# Patient Record
Sex: Male | Born: 1960
Health system: Southern US, Community
[De-identification: ages and names within clinical notes are randomized; demographics above are authoritative.]

## PROBLEM LIST (undated history)

## (undated) DIAGNOSIS — Z8619 Personal history of other infectious and parasitic diseases: Secondary | ICD-10-CM

## (undated) DIAGNOSIS — K5730 Diverticulosis of large intestine without perforation or abscess without bleeding: Secondary | ICD-10-CM

## (undated) DIAGNOSIS — E079 Disorder of thyroid, unspecified: Secondary | ICD-10-CM

## (undated) DIAGNOSIS — G4730 Sleep apnea, unspecified: Secondary | ICD-10-CM

## (undated) DIAGNOSIS — I509 Heart failure, unspecified: Secondary | ICD-10-CM

## (undated) DIAGNOSIS — M1990 Unspecified osteoarthritis, unspecified site: Secondary | ICD-10-CM

## (undated) DIAGNOSIS — K219 Gastro-esophageal reflux disease without esophagitis: Secondary | ICD-10-CM

## (undated) DIAGNOSIS — E785 Hyperlipidemia, unspecified: Secondary | ICD-10-CM

## (undated) DIAGNOSIS — K635 Polyp of colon: Secondary | ICD-10-CM

## (undated) DIAGNOSIS — I10 Essential (primary) hypertension: Secondary | ICD-10-CM

## (undated) HISTORY — DX: Sleep apnea, unspecified: G47.30

## (undated) HISTORY — DX: Essential (primary) hypertension: I10

## (undated) HISTORY — DX: Gastro-esophageal reflux disease without esophagitis: K21.9

## (undated) HISTORY — DX: Polyp of colon: K63.5

## (undated) HISTORY — DX: Diverticulosis of large intestine without perforation or abscess without bleeding: K57.30

## (undated) HISTORY — PX: TONSILLECTOMY AND ADENOIDECTOMY: SUR1326

## (undated) HISTORY — DX: Personal history of other infectious and parasitic diseases: Z86.19

## (undated) HISTORY — DX: Hyperlipidemia, unspecified: E78.5

---

## 2008-04-07 HISTORY — PX: CARDIAC CATHETERIZATION: SHX172

## 2008-10-19 ENCOUNTER — Ambulatory Visit: Payer: Self-pay | Admitting: Family Medicine

## 2008-10-19 DIAGNOSIS — I10 Essential (primary) hypertension: Secondary | ICD-10-CM

## 2008-10-19 DIAGNOSIS — K219 Gastro-esophageal reflux disease without esophagitis: Secondary | ICD-10-CM | POA: Insufficient documentation

## 2008-10-19 DIAGNOSIS — K5732 Diverticulitis of large intestine without perforation or abscess without bleeding: Secondary | ICD-10-CM | POA: Insufficient documentation

## 2008-10-19 DIAGNOSIS — D126 Benign neoplasm of colon, unspecified: Secondary | ICD-10-CM | POA: Insufficient documentation

## 2008-10-19 DIAGNOSIS — Z9189 Other specified personal risk factors, not elsewhere classified: Secondary | ICD-10-CM | POA: Insufficient documentation

## 2008-10-20 LAB — CONVERTED CEMR LAB
BUN: 13 mg/dL (ref 6–23)
CO2: 29 meq/L (ref 19–32)
Chloride: 103 meq/L (ref 96–112)
Cholesterol: 210 mg/dL — ABNORMAL HIGH (ref 0–200)
Creatinine, Ser: 1.2 mg/dL (ref 0.4–1.5)
Direct LDL: 161.2 mg/dL
Glucose, Bld: 94 mg/dL (ref 70–99)
PSA: 0.42 ng/mL (ref 0.10–4.00)
Potassium: 4.7 meq/L (ref 3.5–5.1)
Total CHOL/HDL Ratio: 7
VLDL: 24 mg/dL (ref 0.0–40.0)

## 2008-11-22 ENCOUNTER — Ambulatory Visit: Payer: Self-pay | Admitting: Family Medicine

## 2008-11-22 DIAGNOSIS — E785 Hyperlipidemia, unspecified: Secondary | ICD-10-CM | POA: Insufficient documentation

## 2008-11-22 LAB — CONVERTED CEMR LAB
Cholesterol, target level: 200 mg/dL
LDL Goal: 130 mg/dL

## 2008-12-20 ENCOUNTER — Encounter: Payer: Self-pay | Admitting: Family Medicine

## 2008-12-25 ENCOUNTER — Ambulatory Visit: Payer: Self-pay | Admitting: Family Medicine

## 2008-12-25 DIAGNOSIS — I447 Left bundle-branch block, unspecified: Secondary | ICD-10-CM | POA: Insufficient documentation

## 2008-12-25 DIAGNOSIS — L02619 Cutaneous abscess of unspecified foot: Secondary | ICD-10-CM

## 2008-12-25 DIAGNOSIS — L03119 Cellulitis of unspecified part of limb: Secondary | ICD-10-CM

## 2008-12-25 DIAGNOSIS — R079 Chest pain, unspecified: Secondary | ICD-10-CM

## 2008-12-25 DIAGNOSIS — T25229A Burn of second degree of unspecified foot, initial encounter: Secondary | ICD-10-CM

## 2009-01-12 ENCOUNTER — Encounter: Payer: Self-pay | Admitting: Family Medicine

## 2009-01-12 ENCOUNTER — Ambulatory Visit: Payer: Self-pay | Admitting: Unknown Physician Specialty

## 2009-01-31 ENCOUNTER — Ambulatory Visit: Payer: Self-pay | Admitting: Cardiology

## 2009-02-05 ENCOUNTER — Telehealth (INDEPENDENT_AMBULATORY_CARE_PROVIDER_SITE_OTHER): Payer: Self-pay

## 2009-02-06 ENCOUNTER — Ambulatory Visit: Payer: Self-pay

## 2009-02-06 ENCOUNTER — Ambulatory Visit: Payer: Self-pay | Admitting: Cardiology

## 2009-02-06 ENCOUNTER — Encounter (HOSPITAL_COMMUNITY): Admission: RE | Admit: 2009-02-06 | Discharge: 2009-04-05 | Payer: Self-pay | Admitting: Cardiology

## 2009-02-27 ENCOUNTER — Ambulatory Visit: Payer: Self-pay

## 2009-02-27 ENCOUNTER — Ambulatory Visit (HOSPITAL_COMMUNITY): Admission: RE | Admit: 2009-02-27 | Discharge: 2009-02-27 | Payer: Self-pay | Admitting: Cardiology

## 2009-02-27 ENCOUNTER — Encounter: Payer: Self-pay | Admitting: Cardiology

## 2009-02-27 ENCOUNTER — Ambulatory Visit: Payer: Self-pay | Admitting: Cardiology

## 2009-03-05 ENCOUNTER — Ambulatory Visit: Payer: Self-pay | Admitting: Cardiology

## 2009-03-05 DIAGNOSIS — I509 Heart failure, unspecified: Secondary | ICD-10-CM | POA: Insufficient documentation

## 2009-03-05 DIAGNOSIS — R0989 Other specified symptoms and signs involving the circulatory and respiratory systems: Secondary | ICD-10-CM

## 2009-03-05 DIAGNOSIS — R0609 Other forms of dyspnea: Secondary | ICD-10-CM

## 2009-03-07 LAB — CONVERTED CEMR LAB
CO2: 31 meq/L (ref 19–32)
Calcium: 9.2 mg/dL (ref 8.4–10.5)
Chloride: 99 meq/L (ref 96–112)
Creatinine, Ser: 1 mg/dL (ref 0.4–1.5)
Glucose, Bld: 84 mg/dL (ref 70–99)
Pro B Natriuretic peptide (BNP): 14 pg/mL (ref 0.0–100.0)

## 2009-03-12 ENCOUNTER — Telehealth (INDEPENDENT_AMBULATORY_CARE_PROVIDER_SITE_OTHER): Payer: Self-pay | Admitting: *Deleted

## 2009-03-12 ENCOUNTER — Ambulatory Visit: Payer: Self-pay | Admitting: Cardiology

## 2009-03-12 ENCOUNTER — Observation Stay (HOSPITAL_COMMUNITY): Admission: EM | Admit: 2009-03-12 | Discharge: 2009-03-13 | Payer: Self-pay | Admitting: Emergency Medicine

## 2009-03-13 ENCOUNTER — Telehealth: Payer: Self-pay | Admitting: Family Medicine

## 2009-03-14 ENCOUNTER — Telehealth: Payer: Self-pay | Admitting: Family Medicine

## 2009-03-15 ENCOUNTER — Telehealth: Payer: Self-pay | Admitting: Family Medicine

## 2009-03-16 ENCOUNTER — Ambulatory Visit: Admission: RE | Admit: 2009-03-16 | Discharge: 2009-03-16 | Payer: Self-pay | Admitting: Cardiology

## 2009-03-24 ENCOUNTER — Encounter: Payer: Self-pay | Admitting: Pulmonary Disease

## 2009-05-01 ENCOUNTER — Ambulatory Visit: Payer: Self-pay | Admitting: Cardiology

## 2009-05-02 DIAGNOSIS — G4733 Obstructive sleep apnea (adult) (pediatric): Secondary | ICD-10-CM

## 2009-05-23 ENCOUNTER — Ambulatory Visit: Payer: Self-pay | Admitting: Pulmonary Disease

## 2009-05-24 ENCOUNTER — Telehealth: Payer: Self-pay | Admitting: Family Medicine

## 2009-06-06 ENCOUNTER — Encounter: Payer: Self-pay | Admitting: Pulmonary Disease

## 2009-06-12 ENCOUNTER — Ambulatory Visit: Payer: Self-pay | Admitting: Cardiology

## 2009-06-12 DIAGNOSIS — I420 Dilated cardiomyopathy: Secondary | ICD-10-CM

## 2009-06-19 ENCOUNTER — Encounter: Payer: Self-pay | Admitting: Pulmonary Disease

## 2009-06-26 ENCOUNTER — Ambulatory Visit: Payer: Self-pay | Admitting: Pulmonary Disease

## 2009-07-04 ENCOUNTER — Telehealth: Payer: Self-pay | Admitting: Pulmonary Disease

## 2009-07-26 ENCOUNTER — Telehealth (INDEPENDENT_AMBULATORY_CARE_PROVIDER_SITE_OTHER): Payer: Self-pay | Admitting: *Deleted

## 2009-08-02 ENCOUNTER — Encounter: Payer: Self-pay | Admitting: Pulmonary Disease

## 2009-08-03 ENCOUNTER — Ambulatory Visit: Payer: Self-pay | Admitting: Cardiology

## 2009-08-13 ENCOUNTER — Ambulatory Visit: Payer: Self-pay | Admitting: Pulmonary Disease

## 2009-09-19 ENCOUNTER — Ambulatory Visit: Payer: Self-pay | Admitting: Cardiology

## 2009-10-22 ENCOUNTER — Ambulatory Visit: Payer: Self-pay | Admitting: Cardiology

## 2009-10-22 DIAGNOSIS — R002 Palpitations: Secondary | ICD-10-CM

## 2009-10-26 ENCOUNTER — Ambulatory Visit: Payer: Self-pay | Admitting: Cardiology

## 2009-10-26 ENCOUNTER — Telehealth (INDEPENDENT_AMBULATORY_CARE_PROVIDER_SITE_OTHER): Payer: Self-pay | Admitting: *Deleted

## 2009-10-29 ENCOUNTER — Ambulatory Visit: Payer: Self-pay | Admitting: Family Medicine

## 2009-10-29 DIAGNOSIS — R5381 Other malaise: Secondary | ICD-10-CM | POA: Insufficient documentation

## 2009-10-29 DIAGNOSIS — R5383 Other fatigue: Secondary | ICD-10-CM

## 2009-10-29 LAB — CONVERTED CEMR LAB
ALT: 20 units/L (ref 0–53)
AST: 22 units/L (ref 0–37)
Albumin: 4.4 g/dL (ref 3.5–5.2)
BUN: 18 mg/dL (ref 6–23)
Basophils Relative: 0.5 % (ref 0.0–3.0)
Cholesterol: 189 mg/dL (ref 0–200)
Creatinine, Ser: 1.2 mg/dL (ref 0.4–1.5)
Eosinophils Absolute: 0.2 10*3/uL (ref 0.0–0.7)
GFR calc non Af Amer: 65.47 mL/min (ref >60)
Glucose, Bld: 99 mg/dL (ref 70–99)
Hemoglobin: 12.9 g/dL — ABNORMAL LOW (ref 13.0–17.0)
Lymphs Abs: 1.9 10*3/uL (ref 0.7–4.0)
MCHC: 35.1 g/dL (ref 30.0–36.0)
MCV: 92 fL (ref 78.0–100.0)
Monocytes Absolute: 0.7 10*3/uL (ref 0.1–1.0)
Neutro Abs: 3.9 10*3/uL (ref 1.4–7.7)
Potassium: 4.5 meq/L (ref 3.5–5.1)
RBC: 3.99 M/uL — ABNORMAL LOW (ref 4.22–5.81)
RDW: 12.7 % (ref 11.5–14.6)
Triglycerides: 172 mg/dL — ABNORMAL HIGH (ref 0.0–149.0)

## 2009-11-01 ENCOUNTER — Ambulatory Visit: Payer: Self-pay | Admitting: Family Medicine

## 2009-11-01 DIAGNOSIS — L570 Actinic keratosis: Secondary | ICD-10-CM

## 2009-11-26 ENCOUNTER — Ambulatory Visit: Payer: Self-pay | Admitting: Cardiology

## 2009-12-24 ENCOUNTER — Ambulatory Visit: Payer: Self-pay | Admitting: Cardiology

## 2010-01-30 ENCOUNTER — Ambulatory Visit: Payer: Self-pay | Admitting: Family Medicine

## 2010-02-04 LAB — CONVERTED CEMR LAB
Albumin: 4.3 g/dL (ref 3.5–5.2)
Alkaline Phosphatase: 42 units/L (ref 39–117)
Cholesterol: 128 mg/dL (ref 0–200)
HDL: 26.9 mg/dL — ABNORMAL LOW (ref >39.00)
LDL Cholesterol: 68 mg/dL (ref 0–99)
Total Protein: 7.3 g/dL (ref 6.0–8.3)
Triglycerides: 167 mg/dL — ABNORMAL HIGH (ref 0.0–149.0)
VLDL: 33.4 mg/dL (ref 0.0–40.0)

## 2010-03-22 ENCOUNTER — Ambulatory Visit: Payer: Self-pay | Admitting: Cardiology

## 2010-04-28 ENCOUNTER — Encounter: Payer: Self-pay | Admitting: Cardiology

## 2010-05-07 NOTE — Assessment & Plan Note (Signed)
Summary: abn sleep study/apc   Visit Type:  Initial Consult Copy to:  Dr. Antoine Poche Primary Provider/Referring Provider:  Hannah Beat MD  CC:  Pt here for sleep consult. Marland Kitchen  History of Present Illness: 49/M , referred for evaluation of obstructive sleep apnea . Presented with recurrent chest pains, cardiac evaluation has revealed non ischemic cardiomyopathy with EF 35%. He is on medical therapy, has succesfully lost 20 lbs fro 250 to 230 in the last 8 months. Epworth Sleepiness Score is 6/24, he reports fatigue on returning from work but denies excessive daytime somnolence. Bedtime around 11 pm, sleep latency about 5 mins, BR x1, no post void latency, sleeps on his side with 1 pillow, wakes up with a dry mouth, no headaches. Snoring is loud, wife sleeps in a separate bedroom because she moves around too much There is no history suggestive of cataplexy, sleep paralysis or parasomnias  He reports irrestible urge to move his legs in the venings but this does not disturn his sleep. Overnight PSG showed nml sleep architecture, AHI 25/h with lowest deaturation to 79%.  Preventive Screening-Counseling & Management  Alcohol-Tobacco     Smoking Status: quit     Packs/Day: 2.0     Year Started: 1978     Year Quit: 1982   History of Present Illness: don't sleep good. Wake up tired  What time do you typically go to bed?(between what hours): 10 & 11pm  How long does it take you to fall asleep?  How many times during the night do you wake up? 10-15  What time do you get out of bed to start your day? 5:30am  Do you drive or operate heavy machinery in your occupation? no  How much has your weight changed (up or down) over the past two years? (in pounds): down 20 pounds  Have you ever had a sleep study before?  If yes,when and where: [facility] 16, 2009[facility]  Do you currently use CPAP ? If so , at what pressure? no  Do you wear oxygen at any time? If yes, how many liters per  minute? no Current Medications (verified): 1)  Lisinopril-Hydrochlorothiazide 10-12.5 Mg Tabs (Lisinopril-Hydrochlorothiazide) .... One Daily 2)  Prilosec Otc 20 Mg Tbec (Omeprazole Magnesium) .... Take One Tab By Mouth Once Daily 3)  Multivitamins  Tabs (Multiple Vitamin) .... Take One Tab By Mouth Once Daily 4)  Carvedilol 3.125 Mg Tabs (Carvedilol) .... One Twice A Day  Allergies (verified): No Known Drug Allergies  Past History:  Past Medical History: Last updated: 05/01/2009 COLONIC POLYPS (ICD-211.3) GERD (ICD-530.81) DIVERTICULITIS, COLON (ICD-562.11) CHICKENPOX, HX OF (ICD-V15.9) Hypertension (recent) Hyperlipidemia (recent) Cardiomyopathy (EF 30 - 40%) Sleep Apnea  Past Surgical History: Last updated: 01/31/2009 Tonsils and adenoids 1960 Colonoscopy, 01/2009, Dr. Mechele Collin, + polyps  Social History: Occupation: Chartered certified accountant Married, lives with wife Sargon Scouten Alcohol use-no Drug use-no Regular exercise-no Tobacco use 1-1/2 packs per day x3 years quitting in 1982 Pt d/c use of Marijuana in 1999Smoking Status:  quit Packs/Day:  2.0  Review of Systems       The patient complains of shortness of breath with activity, non-productive cough, irregular heartbeats, acid heartburn, indigestion, and weight change.  The patient denies shortness of breath at rest, productive cough, coughing up blood, chest pain, loss of appetite, abdominal pain, difficulty swallowing, sore throat, tooth/dental problems, headaches, nasal congestion/difficulty breathing through nose, sneezing, itching, ear ache, anxiety, depression, hand/feet swelling, joint stiffness or pain, rash, change in color of mucus,  and fever.    Vital Signs:  Patient profile:   50 year old male Height:      70 inches Weight:      240.13 pounds O2 Sat:      97 % on Room air Temp:     97.2 degrees F oral Pulse rate:   88 / minute BP sitting:   110 / 80  (left arm) Cuff size:   large  Vitals Entered By: Zackery Barefoot CMA (May 23, 2009 4:09 PM)  O2 Flow:  Room air CC: Pt here for sleep consult.  Comments Medications reviewed with patient Verified pt's contact number Zackery Barefoot CMA  May 23, 2009 4:09 PM    Physical Exam  Additional Exam:  Gen. Pleasant, well-nourished, in no distress, normal affect ENT - no lesions, no post nasal drip, class 2 airway Neck: No JVD, no thyromegaly, no carotid bruits Lungs: no use of accessory muscles, no dullness to percussion, clear without rales or rhonchi  Cardiovascular: Rhythm regular, heart sounds  normal, no murmurs or gallops, no peripheral edema Abdomen: soft and non-tender, no hepatosplenomegaly, BS normal. Musculoskeletal: No deformities, no cyanosis or clubbing Neuro:  alert, non focal     Impression & Recommendations:  Problem # 1:  SLEEP APNEA (ICD-780.57) The pathophysiology of obstructive sleep apnea, it's cardiovascular consequences and modes of treatment including CPAP were discussed with the patient in great detail.  Moderate based on AHi criteria. Ideally should have in lab titration but in absence of significant disease , can try autoCPAP , likely full face mask - will send over  to sleep lab for mask fit & desensitization. Check ownload in 2-4 wks & FU Orders: Consultation Level IV (16109) DME Referral (DME)  Problem # 2:  HYPERTENSION (ICD-401.9) expect better control His updated medication list for this problem includes:    Lisinopril-hydrochlorothiazide 10-12.5 Mg Tabs (Lisinopril-hydrochlorothiazide) ..... One daily    Carvedilol 3.125 Mg Tabs (Carvedilol) ..... One twice a day  Problem # 3:  Restless legs No significant PLMs during sleep but will readdress  if persistent after CPAP use  Patient Instructions: 1)  Copy sent to: Dr Antoine Poche 2)  Please schedule a follow-up appointment in 1 month. 3)  Go over to the sleep lab [phone] , 3 rd floor for mask fit     Appended Document: abn sleep  study/apc fitted with Lg  quattro mask

## 2010-05-07 NOTE — Assessment & Plan Note (Signed)
Summary: cpx/alc   Vital Signs:  Patient profile:   50 year old male Height:      70 inches Weight:      245.2 pounds BMI:     35.31 Temp:     97.8 degrees F oral Pulse rate:   76 / minute Pulse rhythm:   regular BP sitting:   120 / 78  (left arm) Cuff size:   large  Vitals Entered By: Benny Lennert CMA Duncan Dull) (November 01, 2009 2:41 PM)  History of Present Illness: Chief complaint cpx  Cadriomyopathy, tolerating all increases in medications without difficulty.think followed and managed by  cardiology.  They've been increasing his Corag dosing and he is not had problems with this. Also on aspirin therapy and ACE inhibitor.  OSA, propylene now wearing his CPAP.  R facial lesion, 2 small, frozencome to areas of concern. Additionally also has a another area on his back he would like me to look at.   Hyperlipidemia, patient's not at goal given his risk factors  including hypertension and cardiomyopathy, so he would have  an LDL goal of middle ear 100 depending or at least a potential is 70.  He has not been on any medications in the past, and we did go reviewed the positives and negatives as side effects of these.  Lipid Management History:      Positive NCEP/ATP III risk factors include male age 98 years old or older, HDL cholesterol less than 40, and hypertension.  Negative NCEP/ATP III risk factors include non-diabetic, non-tobacco-user status, no ASHD (atherosclerotic heart disease), no prior stroke/TIA, no peripheral vascular disease, and no history of aortic aneurysm.        The patient states that he knows about the "Therapeutic Lifestyle Change" diet.  His compliance with the TLC diet is poor.    Preventive Screening-Counseling & Management  Alcohol-Tobacco     Alcohol drinks/day: 0     Alcohol Counseling: not indicated; patient does not drink     Smoking Status: quit     Packs/Day: 2.0     Year Started: 1978     Year Quit: 1982     Tobacco Counseling: to remain off  tobacco products  Caffeine-Diet-Exercise     Diet Comments: Not goot     Diet Counseling: improving, lost 20 pounds     Does Patient Exercise: no     Exercise Counseling: to improve exercise regimen  Hep-HIV-STD-Contraception     Hepatitis Risk: no risk noted     HIV Risk: no risk noted     STD Risk: no risk noted     Contraception Counseling: not indicated; no questions/concerns expressed     Testicular SE Education/Counseling to perform regular STE     Sun Exposure Counseling: to decrease sun exposure  Safety-Violence-Falls     Seat Belt Use: yes     Violence in the Home: no risk noted     Sexual Abuse: no     Fall Risk Counseling: not indicated; no significant falls noted      Sexual History:  currently monogamous.        Drug Use:  former, intranasal cocaine, marijuana, and no.    Clinical Review Panels:  Prevention   Last PSA:  0.67 (10/29/2009)  Lipid Management   Cholesterol:  189 (10/29/2009)   LDL (bad choesterol):  127 (10/29/2009)   HDL (good cholesterol):  28.10 (10/29/2009)  CBC   WBC:  6.6 (10/29/2009)   RBC:  3.99 (10/29/2009)  Hgb:  12.9 (10/29/2009)   Hct:  36.7 (10/29/2009)   Platelets:  221.0 (10/29/2009)   MCV  92.0 (10/29/2009)   MCHC  35.1 (10/29/2009)   RDW  12.7 (10/29/2009)   PMN:  58.4 (10/29/2009)   Lymphs:  28.8 (10/29/2009)   Monos:  10.0 (10/29/2009)   Eosinophils:  2.3 (10/29/2009)   Basophil:  0.5 (10/29/2009)  Complete Metabolic Panel   Glucose:  99 (10/29/2009)   Sodium:  133 (10/29/2009)   Potassium:  4.5 (10/29/2009)   Chloride:  98 (10/29/2009)   CO2:  28 (10/29/2009)   BUN:  18 (10/29/2009)   Creatinine:  1.2 (10/29/2009)   Albumin:  4.4 (10/29/2009)   Total Protein:  7.2 (10/29/2009)   Calcium:  9.1 (10/29/2009)   Total Bili:  0.8 (10/29/2009)   Alk Phos:  43 (10/29/2009)   SGPT (ALT):  20 (10/29/2009)   SGOT (AST):  22 (10/29/2009)   Allergies (verified): No Known Drug Allergies  Past History:  Past  medical, surgical, family and social histories (including risk factors) reviewed, and no changes noted (except as noted below).  Past Medical History: Reviewed history from 05/01/2009 and no changes required. COLONIC POLYPS (ICD-211.3) GERD (ICD-530.81) DIVERTICULITIS, COLON (ICD-562.11) CHICKENPOX, HX OF (ICD-V15.9) Hypertension (recent) Hyperlipidemia (recent) Cardiomyopathy (EF 30 - 40%) Sleep Apnea  Past Surgical History: Reviewed history from 01/31/2009 and no changes required. Tonsils and adenoids 1960 Colonoscopy, 01/2009, Dr. Mechele Collin, + polyps  Family History: Reviewed history from 01/31/2009 and no changes required. Family History of Arthritis Family History Diabetes 1st degree relative Family History High cholesterol Family History Lung cancer Family History of Stroke F 1st degree relative <60 Family History of Stroke M 1st degree relative <50  Social History: Reviewed history from 05/23/2009 and no changes required. Occupation: Chartered certified accountant Married, lives with wife Creedon Danielski Alcohol use-no Drug use-no Regular exercise-no Tobacco use 1-1/2 packs per day x3 years quitting in 1982 Pt d/c use of Marijuana in 1999  Review of Systems       The patient complains of weight loss, dyspnea on exertion, and suspicious skin lesions.  The patient denies anorexia, fever, weight gain, vision loss, decreased hearing, hoarseness, chest pain, syncope, peripheral edema, headaches, hemoptysis, abdominal pain, melena, hematochezia, severe indigestion/heartburn, hematuria, incontinence, genital sores, muscle weakness, transient blindness, difficulty walking, depression, unusual weight change, abnormal bleeding, enlarged lymph nodes, angioedema, and testicular masses.         Otherwise, the pertinent positives and negatives are listed above and in the HPI, otherwise a full review of systems has been reviewed and is negative unless noted positive.    Impression &  Recommendations:  Problem # 1:  HEALTH MAINTENANCE EXAM (ICD-V70.0) The patient's preventative maintenance and recommended screening tests for an annual wellness exam were reviewed in full today. Brought up to date unless services declined.  Counselled on the importance of diet, exercise, and its role in overall health and mortality. The patient's FH and SH was reviewed, including their home life, tobacco status, and drug and alcohol status.   Problem # 2:  HYPERLIPIDEMIA (ICD-272.4) >10 minutes spent in face to face time with patient, >50% spent in counselling or coordination of care: 10 mins alone talking about statins, their risk and role in coronary disease,  atherosclerosis, peripheral vascular disease, rashes, and  complications.  The patient ultimately elected to begin medical therapy.  His updated medication list for this problem includes:    Simvastatin 40 Mg Tabs (Simvastatin) .Marland Kitchen... 1 by mouth at bedtime  Labs Reviewed: SGOT: 22 (10/29/2009)   SGPT: 20 (10/29/2009)  Lipid Goals: Chol Goal: 200 (11/22/2008)   HDL Goal: 40 (11/22/2008)   LDL Goal: 130 (11/22/2008)   TG Goal: 150 (11/22/2008)  Prior 10 Yr Risk Heart Disease: 18 % (11/22/2008)   HDL:28.10 (10/29/2009), 31.60 (10/19/2008)  LDL:127 (10/29/2009), 160 (10/19/2008)  Chol:189 (10/29/2009), 210 (10/19/2008)  Trig:172.0 (10/29/2009), 120.0 (10/19/2008)  Problem # 3:  ACTINIC KERATOSIS (ICD-702.0) Assessment: New  What appears to be 2 AK's vs. SK' s on the R face were frozen off with cryosurgery  Orders: Cryotherapy/Destruction benign or premalignant lesion (1st lesion)  (17000)  Complete Medication List: 1)  Prilosec Otc 20 Mg Tbec (Omeprazole magnesium) .... Take one tab by mouth once daily 2)  Multivitamins Tabs (Multiple vitamin) .... Take one tab by mouth once daily 3)  Lisinopril 10 Mg Tabs (Lisinopril) .... One daily 4)  Cpap 8 Cm  5)  Carvedilol 12.5 Mg Tabs (Carvedilol) .... One twice a day 6)   Carvedilol 3.125 Mg Tabs (Carvedilol) .... One twice a day 7)  Simvastatin 40 Mg Tabs (Simvastatin) .Marland Kitchen.. 1 by mouth at bedtime  Lipid Assessment/Plan:      Based on NCEP/ATP III, the patient's risk factor category is "2 or more risk factors and a calculated 10 year CAD risk of < 20%".  The patient's lipid goals have been set as follows: Total cholesterol goal is 200; LDL cholesterol goal is 100; HDL cholesterol goal is 40; Triglyceride goal is 150.  His LDL cholesterol goal has not been met.  He has been counseled on adjunctive measures for lowering his cholesterol and has been provided with dietary instructions.    Patient Instructions: 1)  recheck 2)  FLP, HFP: 272.4, 3 months Prescriptions: SIMVASTATIN 40 MG TABS (SIMVASTATIN) 1 by mouth at bedtime  #30 x 11   Entered and Authorized by:   Hannah Beat MD   Signed by:   Hannah Beat MD on 11/01/2009   Method used:   Electronically to        [facility]  [id number] (retail)       [address]       [place], [place]  [address]       Ph: [phone] or [phone]       Fax: [phone]   RxID:   [id number]   Current Allergies (reviewed today): No known allergies      Diagnosis: Actinic Keratoses Destruction: Cryosurgery Body area(s) R facial area, cheek Number of Lesions: 2 Anesthesia: None Disposition: To home    General Medical Physical Exam:  General Appearance:      Well developed, well nourished, in no acute distress.  Head:      Inspection:     normocephalic without obvious abnormalities      Palpation:     no abnormal lesions palpable  Eyes:      External:     conjunctiva and lids normal      Pupils:     equal, round, and reactive to light and accommodation  Ears, Nose, Throat:      External:     normocephalic and atraumatic      Otoscopic:     canals clear; tympanic membranes intact with normal light reflex      Hearing:     grossly intact      Nasal:     no external deformity.         Dental:     good dentition  Pharynx:     tongue normal; posterior pharynx without erythema or exudate  Neck:      Neck:     supple; no masses; trachea midline      Thyroid:     no nodules, masses, tenderness, or enlargement  Respiratory:      Resp. effort:     no intercostal retractions or use of accessory muscles      Percussion:     no dullness      Palpation:     normal fremitus      Auscultation:     no rales, rhonchi, or wheezes  Chest Wall:      Chest wall:     no masses or gynecomastia      Axilla:     no axillary adenopathy  Cardiovascular:      Palpation:     normal PMI with no thrills palpable.        Auscultation:     regular rate and rhythm, S1, S2 without murmurs, rubs, gallops, or clicks.    Gastrointestinal:      Abdomen:     soft and non-tender with normal bowel sounds; no masses      Liver/spleen:     normal to percussion; no enlargement or nodularity      Hernia:     no hernias      Rectal:     no masses or tenderness      Stool:     not done  Genitourinary:      Scrotum:     no lesions, cysts, edema, or rash      Penis:     no lesions or discharge      Prostate:     no enlargement or nodularity  Musculoskeletal:      Gait/station:     normal gait; no ataxia      Digits/nails:     no cyanosis, clubbing, or petechiae      Head/neck:     normal alignment and mobility      Trunk:     normal alignment and mobility; no deformity      RUE:     normal range of motion and strength      RLE:     normal range of motion and strength      LUE:     normal range of motion and strength      LLE:       normal range of motion and strength  Lymphatic:      Neck:     no cervical adenopathy      Axilla:     no axillary adenopathy      Inguinal:     no inguinal adenopathy      Other:     no other adenopathy  Skin:      Inspection:     seborrheic keratosis      Other:     ak as well  Neurological:      Sensory:     intact to touch  Psychiatric:      Judgement:      intact      Orientation:     oriented to time, place, and person      Memory:     intact for recent and remote events      Mood/affect:     no appearance of anxiety, depression, or agitation

## 2010-05-07 NOTE — Assessment & Plan Note (Signed)
Summary: rov 1 month///kp   Visit Type:  Follow-up Copy to:  Dr. Antoine Poche Primary Provider/Referring Provider:  Hannah Beat MD  CC:  Pt here for sleep follow up. Compliance report received.  History of Present Illness: 49/M , for post CPAP FU of obstructive sleep apnea . Presented with recurrent chest pains, cardiac evaluation has revealed non ischemic cardiomyopathy with EF 35%. He is on medical therapy, has succesfully lost 20 lbs fro 250 to 230 in the last 8 months. Epworth Sleepiness Score is 6/24, he reports fatigue on returning from work but denies excessive daytime somnolence. Bedtime around 11 pm, sleep latency about 5 mins, BR x1, no post void latency, sleeps on his side with 1 pillow, wakes up with a dry mouth, no headaches. Snoring is loud, wife sleeps in a separate bedroom because she moves around too much. He reports irrestible urge to move his legs in the venings but this does not disturn his sleep. Overnight PSG showed nml sleep architecture, AHI 25/h with lowest deaturation to 79%. Moderate based on AHi criteria. Ideally should have in lab titration but in absence of significant disease , can try autoCPAP , likely full face mask - will send over  to sleep lab for mask fit & desensitization.  June 26, 2009 4:38 PM  frequent arousals, sleep latency ok, no dryness, mask ok,  download 2/23 - 3/15 /11 >> avg pressure 8 cm, AHI 3.4, leak +  Current Medications (verified): 1)  Lisinopril-Hydrochlorothiazide 10-12.5 Mg Tabs (Lisinopril-Hydrochlorothiazide) .... One Daily 2)  Prilosec Otc 20 Mg Tbec (Omeprazole Magnesium) .... Take One Tab By Mouth Once Daily 3)  Multivitamins  Tabs (Multiple Vitamin) .... Take One Tab By Mouth Once Daily 4)  Carvedilol 6.25 Mg Tabs (Carvedilol) .... One Two Times A Day  Allergies (verified): No Known Drug Allergies  Past History:  Past Medical History: Last updated: 05/01/2009 COLONIC POLYPS (ICD-211.3) GERD  (ICD-530.81) DIVERTICULITIS, COLON (ICD-562.11) CHICKENPOX, HX OF (ICD-V15.9) Hypertension (recent) Hyperlipidemia (recent) Cardiomyopathy (EF 30 - 40%) Sleep Apnea  Social History: Last updated: 05/23/2009 Occupation: Chartered certified accountant Married, lives with wife Obryan Radu Alcohol use-no Drug use-no Regular exercise-no Tobacco use 1-1/2 packs per day x3 years quitting in 1982 Pt d/c use of Marijuana in 1999  Risk Factors: Smoking Status: quit (05/23/2009) Packs/Day: 2.0 (05/23/2009)  Review of Systems  The patient denies anorexia, fever, weight loss, weight gain, vision loss, decreased hearing, hoarseness, chest pain, syncope, dyspnea on exertion, peripheral edema, prolonged cough, headaches, hemoptysis, abdominal pain, melena, hematochezia, severe indigestion/heartburn, hematuria, muscle weakness, suspicious skin lesions, transient blindness, difficulty walking, depression, unusual weight change, abnormal bleeding, breast masses, and testicular masses.    Vital Signs:  Patient profile:   50 year old male Height:      70 inches Weight:      248.25 pounds O2 Sat:      96 % on Room air Temp:     98.0 degrees F oral Pulse rate:   71 / minute BP sitting:   120 / 68  (left arm) Cuff size:   large  Vitals Entered By: Zackery Barefoot CMA (June 26, 2009 4:16 PM)  O2 Flow:  Room air CC: Pt here for sleep follow up. Compliance report received Comments Medications reviewed with patient Verified contact number and pharmacy with patient Zackery Barefoot CMA  June 26, 2009 4:16 PM    Physical Exam  Additional Exam:  Gen. Pleasant, well-nourished, in no distress, normal affect ENT - no lesions, no post nasal drip,  class 2 airway Neck: No JVD, no thyromegaly, no carotid bruits Lungs: no use of accessory muscles, no dullness to percussion, clear without rales or rhonchi  Cardiovascular: Rhythm regular, heart sounds  normal, no murmurs or gallops, no peripheral edema Musculoskeletal:  No deformities, no cyanosis or clubbing      Impression & Recommendations:  Problem # 1:  SLEEP APNEA (ICD-780.57) Change to fixed pressure 8 cm, reviewed download. Discussed adjustments for leak. Compliance encouraged, wt loss emphasized, asked to avoid meds with sedative side effects, cautioned against driving when sleepy.   Orders: Est. Patient Level III (16109) DME Referral (DME)  Problem # 2:  CARDIOMYOPATHY (ICD-425.4) discussed cardiac benefits of CPAP  Patient Instructions: 1)  Please schedule a follow-up appointment in 1 month. 2)  Change to fixed presure of 8 cm 3)  Keep using your machine at least 6h/ night

## 2010-05-07 NOTE — Assessment & Plan Note (Signed)
Summary: 6 week f/u sl   Visit Type:  Follow-up Primary Provider:  Hannah Beat MD  CC:  Cardiomyopathy.  History of Present Illness: The patient presents for followup of his dilated cardiomyopathy. At the last appointment I increased his carvedilol to 9.375 mg b.i.d. He did well with this. He had no lightheadedness, presyncope or syncope. He had no new shortness of breath, PND or orthopnea. She had no chest pressure, neck or arm discomfort. He is not as active as I would hope he would be but is having no difficulties from a cardiovascular standpoint with his activities of daily living.  Current Medications (verified): 1)  Prilosec Otc 20 Mg Tbec (Omeprazole Magnesium) .... Take One Tab By Mouth Once Daily 2)  Multivitamins  Tabs (Multiple Vitamin) .... Take One Tab By Mouth Once Daily 3)  Carvedilol 6.25 Mg Tabs (Carvedilol) .... One Two Times A Day 4)  Carvedilol 3.125 Mg Tabs (Carvedilol) .... One Two Times A Day With 6.25 Two Times A Day For A Total For 9.375 Mg Bid 5)  Lisinopril 10 Mg Tabs (Lisinopril) .... One Daily 6)  Cpap 8 Cm  Allergies (verified): No Known Drug Allergies  Past History:  Past Medical History: Reviewed history from 05/01/2009 and no changes required. COLONIC POLYPS (ICD-211.3) GERD (ICD-530.81) DIVERTICULITIS, COLON (ICD-562.11) CHICKENPOX, HX OF (ICD-V15.9) Hypertension (recent) Hyperlipidemia (recent) Cardiomyopathy (EF 30 - 40%) Sleep Apnea  Past Surgical History: Reviewed history from 01/31/2009 and no changes required. Tonsils and adenoids 1960 Colonoscopy, 01/2009, Dr. Mechele Collin, + polyps  Review of Systems       As stated in the HPI and negative for all other systems.   Vital Signs:  Patient profile:   50 year old male Height:      70 inches Weight:      245 pounds BMI:     35.28 Pulse rate:   62 / minute Resp:     18 per minute BP sitting:   116 / 72  (left arm)  Vitals Entered By: Marrion Coy, CNA (September 19, 2009 4:02  PM)  Physical Exam  General:  Well developed, well nourished, in no acute distress. Neck:  Neck supple, no JVD. No masses, thyromegaly or abnormal cervical nodes. Chest Wall:  no deformities or breast masses noted Lungs:  Clear bilaterally to auscultation and percussion. Abdomen:  Bowel sounds positive; abdomen soft and non-tender without masses, organomegaly, or hernias noted. No hepatosplenomegaly, obese Msk:  Back normal, normal gait. Muscle strength and tone normal. Extremities:  No clubbing or cyanosis, mild bilateral edema Neurologic:  Alert and oriented x 3. Skin:  Intact without lesions or rashes. Psych:  Normal affect.   Detailed Cardiovascular Exam  Neck    Carotids: Carotids full and equal bilaterally without bruits.      Neck Veins: Normal, no JVD.    Heart    Inspection: no deformities or lifts noted.      Palpation: normal PMI with no thrills palpable.      Auscultation: regular rate and rhythm, S1, S2 without murmurs, rubs, gallops, or clicks.    Vascular    Abdominal Aorta: no palpable masses, pulsations, or audible bruits.      Femoral Pulses: normal femoral pulses bilaterally.      Radial Pulses: normal radial pulses bilaterally.      Peripheral Circulation: no clubbing, cyanosis, with normal capillary refill.     Impression & Recommendations:  Problem # 1:  CARDIOMYOPATHY (ICD-425.4) Today I will increase his carvedilol 12  mg b.i.d. Her target ideally would be 50 b.i.d.  Problem # 2:  OBESITY, UNSPECIFIED (ICD-278.00) I discussed with him again today the need to lose weight with diet and exercise.  Problem # 3:  SLEEP APNEA (ICD-780.57) He is having this treated.  Patient Instructions: 1)  Your physician recommends that you schedule a follow-up appointment in: 1 month with Dr Antoine Poche 2)  Your physician has recommended you make the following change in your medication: Increase Carvedilol to 12.5 mg one twice a day Prescriptions: CARVEDILOL 12.5 MG  TABS (CARVEDILOL) one twice a day  #60 x 3   Entered by:   Charolotte Capuchin, RN   Authorized by:   Rollene Rotunda, MD, [facility]   Signed by:   Charolotte Capuchin, RN on 09/19/2009   Method used:   [facility]        [facility]  [address] [facility])       [address]       [place], [place]  [address]       Ph: [phone] or [phone]       Fax: [phone]   RxID:   [id number]

## 2010-05-07 NOTE — Progress Notes (Signed)
----   Converted from flag ---- ---- 10/26/2009 12:21 PM, Hannah Beat MD wrote: Prephysical Labs, several days before, fasting BMP, HFP, FLP, CBC with diff, TSH, PSA: 272.4, v77.1, ,780.79, v76.44   ---- 10/26/2009 9:58 AM, Mills Koller wrote: Patient is scheduled for cpx with you, need orders for labs w/ dx. Thanks, Terri ------------------------------

## 2010-05-07 NOTE — Assessment & Plan Note (Signed)
Summary: 1 month med titration 428.22  pfh,rn   Visit Type:  Follow-up Referring Provider:  Dr. Antoine Poche Primary Provider:  Hannah Beat MD  CC:  cardiomyopathy.  History of Present Illness: The patient presents for followup of his cardiomyopathy.  At the last appointment I increased his Coreg slightly. He had no significant problems with this. He does get occasional orthostatic symptoms. He also describes palpitations. He has mentioned these before but thinks they're occurring with increasing frequency and duration lasting 5 minutes at a time. He cannot bring these on. He does not have presyncope or syncope. He does feel uncomfortable with these. He is not describing chest pressure, neck or arm discomfort. He is not describing shortness of breath, PND or orthopnea.  Current Medications (verified): 1)  Prilosec Otc 20 Mg Tbec (Omeprazole Magnesium) .... Take One Tab By Mouth Once Daily 2)  Multivitamins  Tabs (Multiple Vitamin) .... Take One Tab By Mouth Once Daily 3)  Lisinopril 10 Mg Tabs (Lisinopril) .... One Daily 4)  Cpap 8 Cm 5)  Carvedilol 12.5 Mg Tabs (Carvedilol) .... One Twice A Day  Allergies (verified): No Known Drug Allergies  Past History:  Past Medical History: Reviewed history from 05/01/2009 and no changes required. COLONIC POLYPS (ICD-211.3) GERD (ICD-530.81) DIVERTICULITIS, COLON (ICD-562.11) CHICKENPOX, HX OF (ICD-V15.9) Hypertension (recent) Hyperlipidemia (recent) Cardiomyopathy (EF 30 - 40%) Sleep Apnea  Past Surgical History: Reviewed history from 01/31/2009 and no changes required. Tonsils and adenoids 1960 Colonoscopy, 01/2009, Dr. Mechele Collin, + polyps  Review of Systems       As stated in the HPI and negative for all other systems.   Vital Signs:  Patient profile:   50 year old male Height:      70 inches Weight:      244 pounds BMI:     35.14 Pulse rate:   64 / minute BP sitting:   115 / 71  (left arm) Cuff size:   large  Vitals  Entered By: Hardin Negus, RMA (October 22, 2009 4:28 PM)  Physical Exam  General:  Well developed, well nourished, in no acute distress. Head:  normocephalic and atraumatic Eyes:  PERRLA/EOM intact; conjunctiva and lids normal. Mouth:  Teeth, gums and palate normal. Oral mucosa normal. Neck:  Neck supple, no JVD. No masses, thyromegaly or abnormal cervical nodes. Chest Wall:  no deformities or breast masses noted Lungs:  Clear bilaterally to auscultation and percussion. Abdomen:  Bowel sounds positive; abdomen soft and non-tender without masses, organomegaly, or hernias noted. No hepatosplenomegaly. Msk:  Back normal, normal gait. Muscle strength and tone normal. Extremities:  No clubbing or cyanosis. Neurologic:  Alert and oriented x 3. Skin:  Intact without lesions or rashes. Cervical Nodes:  no significant adenopathy Psych:  Normal affect.   Detailed Cardiovascular Exam  Neck    Carotids: Carotids full and equal bilaterally without bruits.      Neck Veins: Normal, no JVD.    Heart    Inspection: no deformities or lifts noted.      Palpation: normal PMI with no thrills palpable.      Auscultation: regular rate and rhythm, S1, S2 without murmurs, rubs, gallops, or clicks.    Vascular    Abdominal Aorta: no palpable masses, pulsations, or audible bruits.      Femoral Pulses: normal femoral pulses bilaterally.      Radial Pulses: normal radial pulses bilaterally.      Peripheral Circulation: no clubbing, cyanosis, with normal capillary refill.  Impression & Recommendations:  Problem # 1:  CARDIOMYOPATHY (ICD-425.4) Today I will increase his Coreg to 15.625 mg b.i.d. He will continue the other meds as listed.  Problem # 2:  OBESITY, UNSPECIFIED (ICD-278.00) We again discussed the need to lose weight with diet and exercise. I gave him a goal of 4 pounds of weight loss by the next appointment in one month.  Problem # 3:  PALPITATIONS (ICD-785.1)  I will have him wear a  48-hour Holter monitor. Treatment will be based on these results.  Orders: Holter (Holter)  Patient Instructions: 1)  Your physician recommends that you schedule a follow-up appointment in: 1 month with Dr Antoine Poche 2)  Your physician has recommended you make the following change in your medication: Increase carvedilol 3.125 mg twice a day Prescriptions: CARVEDILOL 3.125 MG TABS (CARVEDILOL) one twice a day  #60 x 6   Entered by:   Charolotte Capuchin, RN   Authorized by:   Rollene Rotunda, MD, [facility]   Signed by:   Charolotte Capuchin, RN on 10/22/2009   Method used:   [facility]        [facility]  [address] (ret[facility])       [address]       [place], [place]  [address]       Ph: [phone] or [phone]       Fax: [phone]   RxID:   [id number]

## 2010-05-07 NOTE — Assessment & Plan Note (Signed)
Summary: 1 MONTH ROV/SL   Visit Type:  Follow-up Primary Provider:  Hannah Beat MD  CC:  Cardiomyopathy.  History of Present Illness: The patient presents for evaluation of his cardiomyopathy. I continue to titrate his meds. At the last appointment I increased his carvedilol from 3.125-6.25 b.i.d. Since that time he has done well. He did get a little bit lightheadedness with this. However, he has had no presyncope or syncope. He has had no shortness of breath, PND or orthopnea. He has no chest pressure, neck or arm discomfort.   Current Medications (verified): 1)  Lisinopril-Hydrochlorothiazide 10-12.5 Mg Tabs (Lisinopril-Hydrochlorothiazide) .... One Daily 2)  Prilosec Otc 20 Mg Tbec (Omeprazole Magnesium) .... Take One Tab By Mouth Once Daily 3)  Multivitamins  Tabs (Multiple Vitamin) .... Take One Tab By Mouth Once Daily 4)  Carvedilol 6.25 Mg Tabs (Carvedilol) .... One Two Times A Day  Allergies (verified): No Known Drug Allergies  Past History:  Past Medical History: Reviewed history from 05/01/2009 and no changes required. COLONIC POLYPS (ICD-211.3) GERD (ICD-530.81) DIVERTICULITIS, COLON (ICD-562.11) CHICKENPOX, HX OF (ICD-V15.9) Hypertension (recent) Hyperlipidemia (recent) Cardiomyopathy (EF 30 - 40%) Sleep Apnea  Past Surgical History: Reviewed history from 01/31/2009 and no changes required. Tonsils and adenoids 1960 Colonoscopy, 01/2009, Dr. Mechele Collin, + polyps  Review of Systems       As stated in the HPI and negative for all other systems.   Vital Signs:  Patient profile:   50 year old male Height:      70 inches Weight:      248 pounds BMI:     35.71 Pulse rate:   68 / minute Resp:     18 per minute BP sitting:   114 / 71  (right arm)  Vitals Entered By: Marrion Coy, CNA (August 03, 2009 4:20 PM)  Physical Exam  General:  Well developed, well nourished, in no acute distress. Head:  normocephalic and atraumatic Eyes:  PERRLA/EOM intact;  conjunctiva and lids normal. Mouth:  Teeth, gums and palate normal. Oral mucosa normal. Neck:  Neck supple, no JVD. No masses, thyromegaly or abnormal cervical nodes. Chest Wall:  no deformities or breast masses noted Lungs:  Clear bilaterally to auscultation and percussion. Heart:  Normal rate and regular rhythm. S1 and S2 normal without gallop, murmur, click, rub or other extra sounds. Abdomen:  Bowel sounds positive; abdomen soft and non-tender without masses, organomegaly, or hernias noted. No hepatosplenomegaly, obese Msk:  Back normal, normal gait. Muscle strength and tone normal. Extremities:  No clubbing or cyanosis. Neurologic:  Alert and oriented x 3. Psych:  Normal affect.   Impression & Recommendations:  Problem # 1:  CARDIOMYOPATHY (ICD-425.4)  The following medications were removed from the medication list:    Lisinopril-hydrochlorothiazide 10-12.5 Mg Tabs (Lisinopril-hydrochlorothiazide) ..... One daily His updated medication list for this problem includes:    Carvedilol 6.25 Mg Tabs (Carvedilol) ..... One two times a day    Carvedilol 3.125 Mg Tabs (Carvedilol) ..... One two times a day with 6.25 two times a day for a total for 9.375 mg bid    Lisinopril 10 Mg Tabs (Lisinopril) ..... One daily    Carvedilol 3.125 Mg Tabs (Carvedilol) ..... One two times a day with 6.25 two times a day for a total for 9.375 mg bid  Problem # 2:  OBESITY, UNSPECIFIED (ICD-278.00) We again discussed the need to lose weight.  Patient Instructions: 1)  Your physician recommends that you schedule a follow-up appointment in: 6 weeks  with Dr Antoine Poche 2)  Your physician has recommended you make the following change in your medication: Stop Lisinopril/HCTZ, start Lisinopril 10 mg and increase carvedilol by 3.125 mg twice a day Prescriptions: LISINOPRIL 10 MG TABS (LISINOPRIL) one daily  #90 x 6   Entered by:   Charolotte Capuchin, RN   Authorized by:   Rollene Rotunda, MD, [facility]   Signed by:    Charolotte Capuchin, RN on 08/03/2009   Method used:   [facility]        [facility]  [address]       [address]       [place], [place]  [address]       Ph: [phone] or [phone]       Fax: [phone]   RxID:   [id number] CARVEDILOL 3.125 MG TABS (CARVEDILOL) one two times a day with 6.25 two times a day for a total for 9.375 mg bid  #180 x 6   Entered by:   Charolotte Capuchin, RN   Authorized by:   Rollene Rotunda, MD, [facility]   Signed by:   Charolotte Capuchin, RN on 08/03/2009   Method used:   [facility]        [facility]  [address] (retail)       [address]       [place], [place]  [address]       Ph: [phone] or [phone]       Fax: [phone]   RxID:   [id number]

## 2010-05-07 NOTE — Progress Notes (Signed)
Summary: ? Stomach virus  Phone Note Call from Patient Call back at Home Phone [phone]   Caller: Patient Call For: Hannah Beat MD Summary of Call: Patient called and states that he has had diarrhea for five days.  Vomited on "Sunday and this morning.  No fever, no nausea, no chills, no body aches.  Advised patient that this is more than likely a stomach virus and it will have to run it's course.  Advised clear liquids, Pedialyte, Gatoraid as tolerated.  No greasy or spicy food.  No fried foods.  Advised of braty diet.  Advised him that there is no guarantee that something will be called in but if it is we will contact him.  Uses [facility] in [place]. Initial call taken by: Nikki Thorpe CMA (AAMA),  May 24, 2009 12:28 PM  Follow-up for Phone Call        can take immodium 2 tabs now and up to 8 a day  if he is nauseous - ok to call in phenergan 25 mg, 1 by mouth q 6 hours as needed nausea. #30  Norovirus is running rampant in the community right now - he is better staying home until feeling better Follow-up by: Rollyn Scialdone MD,  May 24, 2009 12:55 PM  Additional Follow-up for Phone Call Additional follow up Details #1::        Left message on cell phone voicemail advising patient as instructed. Rx sent to pharmacy electronically. Additional Follow-up by: Nikki Thorpe CMA (AAMA),  May 24, 2009 1:55 PM    New/Updated Medications: PROMETHAZINE HCL 25 MG TABS (PROMETHAZINE HCL) take one tablet by mouth every six hours as needed for nausea Prescriptions: PROMETHAZINE HCL 25 MG TABS (PROMETHAZINE HCL) take one tablet by mouth every six hours as needed for nausea  #30 x 0   Entered by:   Nikki Thorpe CMA (AAMA)   Authorized by:   Aevah Stansbery MD   Signed by:   Nikki Thorpe CMA (AAMA) on 05/24/2009   Method used:   Electronically to        [facility]  [address] (retail)       [address]       [place], [place]  [address]       Ph:  [phone]       Fax: [phone]   RxID:   [id number]     Prior Medications: LISINOPRIL-HYDROCHLOROTHIAZIDE 10-12.5 MG TABS (LISINOPRIL-HYDROCHLOROTHIAZIDE) one daily PRILOSEC OTC 20 MG TBEC (OMEPRAZOLE MAGNESIUM) take one tab by mouth once daily MULTIVITAMINS  TABS (MULTIPLE VITAMIN) take one tab by mouth once daily CARVEDILOL 3.125 MG TABS (CARVEDILOL) one twice a day Current Allergies: No known allergies

## 2010-05-07 NOTE — Assessment & Plan Note (Signed)
Summary: 6 weeks rov 428.22  pfh,rn   Visit Type:  Follow-up Primary Provider:  Hannah Beat MD  CC:  Cardiomyopathy.  History of Present Illness: The patient presents for med titration for management of his dilated cardiomyopathy. At the last visit I started him on carvedilol 3.125 mg twice daily. He had no problems with this. Since that time he has had no chest pressure, neck or arm discomfort. She had no palpitations, presyncope or syncope. He's had no PND or orthopnea.  Of note the patient has been treated for sleep apnea. He tolerates the mask but as of yet has not had significant improvement in his daytime fatigue.  Current Medications (verified): 1)  Lisinopril-Hydrochlorothiazide 10-12.5 Mg Tabs (Lisinopril-Hydrochlorothiazide) .... One Daily 2)  Prilosec Otc 20 Mg Tbec (Omeprazole Magnesium) .... Take One Tab By Mouth Once Daily 3)  Multivitamins  Tabs (Multiple Vitamin) .... Take One Tab By Mouth Once Daily 4)  Carvedilol 3.125 Mg Tabs (Carvedilol) .... One Twice A Day  Allergies (verified): No Known Drug Allergies  Past History:  Past Medical History: Reviewed history from 05/01/2009 and no changes required. COLONIC POLYPS (ICD-211.3) GERD (ICD-530.81) DIVERTICULITIS, COLON (ICD-562.11) CHICKENPOX, HX OF (ICD-V15.9) Hypertension (recent) Hyperlipidemia (recent) Cardiomyopathy (EF 30 - 40%) Sleep Apnea  Past Surgical History: Reviewed history from 01/31/2009 and no changes required. Tonsils and adenoids 1960 Colonoscopy, 01/2009, Dr. Mechele Collin, + polyps  Review of Systems       As stated in the HPI and negative for all other systems.   Vital Signs:  Patient profile:   50 year old male Height:      70 inches Weight:      244 pounds BMI:     35.14 Pulse rate:   72 / minute Pulse rhythm:   regular Resp:     16 per minute BP sitting:   130 / 74  (right arm)  Vitals Entered By: Marrion Coy, CNA (June 12, 2009 4:26 PM)  Physical Exam  General:  Well  developed, well nourished, in no acute distress. Head:  normocephalic and atraumatic Eyes:  PERRLA/EOM intact; conjunctiva and lids normal. Mouth:  Teeth, gums and palate normal. Oral mucosa normal. Neck:  Neck supple, no JVD. No masses, thyromegaly or abnormal cervical nodes. Chest Wall:  no deformities or breast masses noted Lungs:  clear Heart:  Normal rate and regular rhythm. S1 and S2 normal without gallop, murmur, click, rub or other extra sounds. Abdomen:  Bowel sounds positive; abdomen soft and non-tender without masses, organomegaly, or hernias noted. No hepatosplenomegaly, obese Msk:  Back normal, normal gait. Muscle strength and tone normal. Extremities:  No clubbing or cyanosis. Neurologic:  Alert and oriented x 3. Skin:  Intact without lesions or rashes. Psych:  Normal affect.   Impression & Recommendations:  Problem # 1:  CARDIOMYOPATHY (ICD-425.4) Today I will titrate his carvedilol to 6.25 mg b.i.d. He will continue the other meds as listed.  Problem # 2:  HYPERTENSION (ICD-401.9) His blood pressure is controlled and is being managed in the context of treating his cardiomyopathy.  Problem # 3:  OBESITY, UNSPECIFIED (ICD-278.00) We again had a discussion about the need to lose weight and in particular the need for exercise.  Patient Instructions: 1)  Your physician recommends that you schedule a follow-up appointment in: 1 month with Dr Antoine Poche 2)  Your physician has recommended you make the following change in your medication: increase Carvedilol to 6.25 mg one twice a day 3)  You have been  diagnosed with Congestive Heart Failure or CHF.  CHF is a condition in which a problem with the structure or function of the heart impairs its ability to supply sufficient blood flow to meet the body's needs.  For further information please visit www.cardiosmart.org for detailed information on CHF. 4)  Your physician recommends that you weigh, daily, at the same time every day, and  in the same amount of clothing.  Please record your daily weights on the handout provided and bring it to your next appointment.

## 2010-05-07 NOTE — Assessment & Plan Note (Signed)
Summary: 1 month/apc   Visit Type:  Follow-up Copy to:  Dr. Antoine Poche Primary Provider/Referring Provider:  Hannah Beat MD  CC:  Pt here for 1 month follow up.  History of Present Illness: 49/M , for post CPAP FU of obstructive sleep apnea . Presented with recurrent chest pains, cardiac evaluation has revealed non ischemic cardiomyopathy with EF 35%. He is on medical therapy, has succesfully lost 20 lbs fro 250 to 230 in the last 8 months. Epworth Sleepiness Score is 6/24, he reports fatigue on returning from work but denies excessive daytime somnolence. Bedtime around 11 pm, sleep latency about 5 mins, BR x1, no post void latency, sleeps on his side with 1 pillow, wakes up with a dry mouth, no headaches. Snoring is loud, wife sleeps in a separate bedroom because she moves around too much. He reports irrestible urge to move his legs in the venings but this does not disturn his sleep. Overnight PSG showed nml sleep architecture, AHI 25/h with lowest deaturation to 79%. Moderate based on AHi criteria.  download on auto 2/23 - 3/15 /11 >> avg pressure 8 cm, AHI 3.4, leak +  Aug 13, 2009 5:12 PM  decreased arousals, sleep latency ok, no dryness, mask ok,  download upto 4/27 on 8 cm>> excellent compliance, residual AHi 3/h  Current Medications (verified): 1)  Prilosec Otc 20 Mg Tbec (Omeprazole Magnesium) .... Take One Tab By Mouth Once Daily 2)  Multivitamins  Tabs (Multiple Vitamin) .... Take One Tab By Mouth Once Daily 3)  Carvedilol 6.25 Mg Tabs (Carvedilol) .... One Two Times A Day 4)  Carvedilol 3.125 Mg Tabs (Carvedilol) .... One Two Times A Day With 6.25 Two Times A Day For A Total For 9.375 Mg Bid 5)  Lisinopril 10 Mg Tabs (Lisinopril) .... One Daily  Allergies (verified): No Known Drug Allergies  Past History:  Past Medical History: Last updated: 05/01/2009 COLONIC POLYPS (ICD-211.3) GERD (ICD-530.81) DIVERTICULITIS, COLON (ICD-562.11) CHICKENPOX, HX OF  (ICD-V15.9) Hypertension (recent) Hyperlipidemia (recent) Cardiomyopathy (EF 30 - 40%) Sleep Apnea  Social History: Last updated: 05/23/2009 Occupation: Chartered certified accountant Married, lives with wife Josuha Fontanez Alcohol use-no Drug use-no Regular exercise-no Tobacco use 1-1/2 packs per day x3 years quitting in 1982 Pt d/c use of Marijuana in 1999  Review of Systems  The patient denies anorexia, fever, weight loss, weight gain, vision loss, decreased hearing, hoarseness, chest pain, syncope, dyspnea on exertion, peripheral edema, prolonged cough, headaches, hemoptysis, abdominal pain, melena, hematochezia, severe indigestion/heartburn, hematuria, muscle weakness, suspicious skin lesions, difficulty walking, depression, unusual weight change, and abnormal bleeding.    Vital Signs:  Patient profile:   50 year old male Height:      70 inches Weight:      248 pounds BMI:     35.71 O2 Sat:      99 % on Room air Temp:     97.5 degrees F oral Pulse rate:   48 / minute BP sitting:   106 / 60  (left arm) Cuff size:   large  Vitals Entered By: Zackery Barefoot CMA (Aug 13, 2009 4:46 PM)  O2 Flow:  Room air CC: Pt here for 1 month follow up Comments Medications reviewed with patient Verified contact number and pharmacy with patient Zackery Barefoot CMA  Aug 13, 2009 4:47 PM    Physical Exam  Additional Exam:  Gen. Pleasant, well-nourished, in no distress, normal affect ENT - no lesions, no post nasal drip, class 2 airway Neck: No JVD,  no thyromegaly, no carotid bruits Lungs: no use of accessory muscles, no dullness to percussion, clear without rales or rhonchi  Cardiovascular: Rhythm regular, heart sounds  normal, no murmurs or gallops, no peripheral edema Musculoskeletal: No deformities, no cyanosis or clubbing      Impression & Recommendations:  Problem # 1:  SLEEP APNEA (ICD-780.57)  ct cpap 8 cm Compliance encouraged, wt loss emphasized, asked to avoid meds with sedative side  effects, cautioned against driving when sleepy.   Orders: Est. Patient Level III (81191)  Problem # 2:  CARDIOMYOPATHY (ICD-425.4)  cardiac benefits of cpap discussed  Orders: Est. Patient Level III (47829)  Medications Added to Medication List This Visit: 1)  Cpap 8 Cm   Patient Instructions: 1)  Copy sent to: Dr Antoine Poche 2)  Please schedule a follow-up appointment in 1 year.

## 2010-05-07 NOTE — Progress Notes (Signed)
Summary: c pap pressure  Phone Note From Other Clinic Call back at [phone]   Caller: [facility] Call For: Nicholas Gray Summary of Call: need order to change c pap pressure Initial call taken by: Rickard Patience,  July 04, 2009 1:43 PM  Follow-up for Phone Call        pt recently saw RA on 06-26-2009 and RA sent order to change pt's cpap pressure to a set pressure of 8.  [facility] calling stating they need an order for this but it looks like RA already put order in EMR.  Will forward to PCCs. Please advise.  Thank you. Aundra Millet Reynolds LPN  July 04, 2009 1:51 PM   Re-Faxed order to [facility] at [phone]. Stood by fax machine and heard order go through. Called [facility] and advised them that the order has been refaxed. She stated that she would put the order in and set up appt to have pt's pressure changed. Rhonda Cobb  July 04, 2009 5:20 PM

## 2010-05-07 NOTE — Letter (Signed)
Summary: CMN for CPAP Supplies/[facility]  CMN for CPAP Supplies/[facility]   Imported By: Sherian Rein 06/11/2009 08:00:39  _____________________________________________________________________  External Attachment:    Type:   Image     Comment:   External Document

## 2010-05-07 NOTE — Assessment & Plan Note (Signed)
Summary: PER CHECK OUT/SF   Visit Type:  Follow-up Primary Provider:  Hannah Beat MD  CC:  Cardiomyopathy.  History of Present Illness: The patient presents for followup of his cardiomyopathy. Since I last saw him he has had no new cardiovascular complaints. He denies any shortness of breath, PND or orthopnea. He has had no chest pressure, neck or arm discomfort. He has had no palpitations, presyncope or syncope. He has been eating too much and gained a little weight. He said he felt lightheaded briefly a couple of times and I titrated his beta blockers at the last appointment. However, this was short-lived.  Current Medications (verified): 1)  Prilosec Otc 20 Mg Tbec (Omeprazole Magnesium) .... Take One Tab By Mouth Once Daily 2)  Multivitamins  Tabs (Multiple Vitamin) .... Take One Tab By Mouth Once Daily 3)  Lisinopril 10 Mg Tabs (Lisinopril) .... One Daily 4)  Cpap 8 Cm 5)  Carvedilol 12.5 Mg Tabs (Carvedilol) .... One Twice A Day 6)  Carvedilol 6.25 Mg Tabs (Carvedilol) .... One Twice A Day 7)  Simvastatin 40 Mg Tabs (Simvastatin) .Marland Kitchen.. 1 By Mouth At Bedtime  Allergies (verified): No Known Drug Allergies  Past History:  Past Medical History: Reviewed history from 05/01/2009 and no changes required. COLONIC POLYPS (ICD-211.3) GERD (ICD-530.81) DIVERTICULITIS, COLON (ICD-562.11) CHICKENPOX, HX OF (ICD-V15.9) Hypertension (recent) Hyperlipidemia (recent) Cardiomyopathy (EF 30 - 40%) Sleep Apnea  Past Surgical History: Reviewed history from 01/31/2009 and no changes required. Tonsils and adenoids 1960 Colonoscopy, 01/2009, Dr. Mechele Collin, + polyps  Review of Systems       As stated in the HPI and negative for all other systems.   Vital Signs:  Patient profile:   50 year old male Height:      70 inches Weight:      250 pounds BMI:     36.00 Pulse rate:   66 / minute Resp:     16 per minute BP sitting:   105 / 65  (right arm)  Vitals Entered By: Marrion Coy,  CNA (December 24, 2009 4:28 PM)  Physical Exam  General:  Well developed, well nourished, in no acute distress. Head:  normocephalic and atraumatic Eyes:  PERRLA/EOM intact; conjunctiva and lids normal. Neck:  supple; no masses; trachea midline Chest Wall:  no deformities or breast masses noted Lungs:  Clear bilaterally to auscultation and percussion. Heart:  Normal rate and regular rhythm. S1 and S2 normal without gallop, murmur, click, rub or other extra sounds. Abdomen:  Bowel sounds positive; abdomen soft and non-tender without masses, organomegaly, or hernias noted. No hepatosplenomegaly. Msk:  Back normal, normal gait. Muscle strength and tone normal. Extremities:  No clubbing or cyanosis. Neurologic:  Alert and oriented x 3. Skin:  seborrheic keratosis Cervical Nodes:  no cervical adenopathy Inguinal Nodes:  no inguinal adenopathy Psych:  Normal affect.   Impression & Recommendations:  Problem # 1:  CARDIOMYOPATHY (ICD-425.4) The patient has done well. He tolerated the Coreg increase at the last visit. However, his blood pressure is low today and I will not titrate further. He is having some fatigue. At this point no further cardiovascular testing or meds change is suggested though if his blood pressure allows I will titrate again in the future.  Problem # 2:  OBESITY, UNSPECIFIED (ICD-278.00) I had another conversation with him today about weight loss but I don't think he's committed to this.  Problem # 3:  PALPITATIONS (ICD-785.1) Is having no palpitations. No change in therapy is indicated.  Patient Instructions: 1)  Your physician recommends that you schedule a follow-up appointment in: 3 months 2)  Your physician recommends that you continue on your current medications as directed. Please refer to the Current Medication list given to you today.

## 2010-05-07 NOTE — Assessment & Plan Note (Signed)
Summary: 1 month rov/sl   Visit Type:  Follow-up Primary Provider:  Hannah Beat MD  CC:  Cardiomyopathy.  History of Present Illness: The patient presents for follow up after a recent hospitalzation for chest pain.  He had a cardiac cath demonstrating no coronary artery disease.  It did confirm that his EF is about 40%.  He since has had a sleep study done at [facility].  I reviewed this today and it demnostrateds moderate sleep apnea.  He reports that he feels well.  He denies chest pain, SOB, PND or orthopnea.  He has no neck or arm discomfort.  He is having no palpitations, presynope or syncope. He has been dieting and he has lost 15 libs..  He is tolerating current medications.  Current Medications (verified): 1)  Hydrochlorothiazide 12.5 Mg  Tabs (Hydrochlorothiazide) .... Take 1 Tab  By Mouth Every Morning 2)  Lisinopril 10 Mg Tabs (Lisinopril) .... One Daily 3)  Prilosec Otc 20 Mg Tbec (Omeprazole Magnesium) .... Take One Tab By Mouth Once Daily 4)  Multivitamins  Tabs (Multiple Vitamin) .... Take One Tab By Mouth Once Daily  Allergies (verified): No Known Drug Allergies  Past History:  Past Medical History: COLONIC POLYPS (ICD-211.3) GERD (ICD-530.81) DIVERTICULITIS, COLON (ICD-562.11) CHICKENPOX, HX OF (ICD-V15.9) Hypertension (recent) Hyperlipidemia (recent) Cardiomyopathy (EF 30 - 40%) Sleep Apnea  Past Surgical History: Reviewed history from 01/31/2009 and no changes required. Tonsils and adenoids 1960 Colonoscopy, 01/2009, Dr. Mechele Collin, + polyps  Review of Systems       As stated in the HPI and negative for all other systems.   Vital Signs:  Patient profile:   50 year old male Height:      70 inches Weight:      243 pounds Pulse rate:   72 / minute Pulse rhythm:   regular BP sitting:   129 / 86  (right arm)  Vitals Entered By: Minerva Areola, RN, BSN (May 01, 2009 5:04 PM)  Physical Exam  General:  Well developed, well nourished, in no acute  distress. Head:  normocephalic and atraumatic Eyes:  PERRLA/EOM intact; conjunctiva and lids normal. Mouth:  Teeth, gums and palate normal. Oral mucosa normal. Neck:  Neck supple, no JVD. No masses, thyromegaly or abnormal cervical nodes. Chest Wall:  no deformities or breast masses noted Lungs:  clear Abdomen:  Bowel sounds positive; abdomen soft and non-tender without masses, organomegaly, or hernias noted. No hepatosplenomegaly, obese Msk:  Back normal, normal gait. Muscle strength and tone normal. Extremities:  No clubbing or cyanosis. Neurologic:  Alert and oriented x 3. Skin:  Intact without lesions or rashes. Psych:  Normal affect.   Detailed Cardiovascular Exam  Neck    Carotids: Carotids full and equal bilaterally without bruits.      Neck Veins: Normal, no JVD.    Heart    Inspection: no deformities or lifts noted.      Palpation: normal PMI with no thrills palpable.      Auscultation: regular rate and rhythm, S1, S2 without murmurs, rubs, gallops, or clicks.    Vascular    Abdominal Aorta: no palpable masses, pulsations, or audible bruits.      Femoral Pulses: normal femoral pulses bilaterally.      Radial Pulses: normal radial pulses bilaterally.      Peripheral Circulation: no clubbing, cyanosis, or edema noted with normal capillary refill.     Impression & Recommendations:  Problem # 1:  SLEEP APNEA (ICD-780.57) The patient will be referred to  a sleep MD in our practice.  We discussed the association between decreased EF and apnea.  Problem # 2:  CHF (ICD-428.0) He will continue the meds as listed.  I will consolidate the linsinopril/hct.  I will also start coreg at 3.125 mg two times a day.  Problem # 3:  OBESITY, UNSPECIFIED (ICD-278.00) I applaud his weight loss to date and ask for more of the same.  Patient Instructions: 1)  Your physician recommends that you schedule a follow-up appointment in: 6 weeks with Dr Antoine Poche 2)  Your physician has  recommended you make the following change in your medication: Start Lisinopril/HCTZ 10/12.5 mg, and Carvedilol 3.125 mg one twice a day 3)  You have been diagnosed with Congestive Heart Failure or CHF.  CHF is a condition in which a problem with the structure or function of the heart impairs its ability to supply sufficient blood flow to meet the body's needs.  For further information please visit www.cardiosmart.org for detailed information on CHF. 4)  Your physician recommends that you weigh, daily, at the same time every day, and in the same amount of clothing.  Please record your daily weights on the handout provided and bring it to your next appointment. Prescriptions: CARVEDILOL 3.125 MG TABS (CARVEDILOL) one twice a day  #60 x 11   Entered by:   Charolotte Capuchin, RN   Authorized by:   Rollene Rotunda, MD, [facility]   Signed by:   Charolotte Capuchin, RN on 05/01/2009   Method used:   [facility]        [facility]  [place] [facility]       [address]       [place], [place]  [address]       Ph: [phone] or [phone]       Fax: [phone]   RxID:   [id number] LISINOPRIL-HYDROCHLOROTHIAZIDE 10-12.5 MG TABS (LISINOPRIL-HYDROCHLOROTHIAZIDE) one daily  #30 x 11   Entered by:   Charolotte Capuchin, RN   Authorized by:   Rollene Rotunda, MD, [facility]   Signed by:   Charolotte Capuchin, RN on 05/01/2009   Method used:   [facility]        [facility]  [place] [facility]       [address]       [place], [place]  [address]       Ph: [phone] or [phone]       Fax: [phone]   RxID:   [id number]

## 2010-05-07 NOTE — Assessment & Plan Note (Signed)
Summary: 1 month med titration  pfh,rn   Visit Type:  Follow-up Primary Provider:  Hannah Beat MD  CC:  Cardiomyopathy.  History of Present Illness: The patient presents for followup of this. At the last appointment he was having some palpitations. He had a 48-hour monitor which I reviewed and reviewed with him today. There were some PACs and PVCs but no significant dysrhythmias. Since that time, and after titration of his carvedilol, he thinks he's done better. He's not noticing them. He's not having any chest pressure, neck or arm discomfort. He's not having any shortness of breath, PND or orthopnea. He's had no presyncope or syncope.  Current Medications (verified): 1)  Prilosec Otc 20 Mg Tbec (Omeprazole Magnesium) .... Take One Tab By Mouth Once Daily 2)  Multivitamins  Tabs (Multiple Vitamin) .... Take One Tab By Mouth Once Daily 3)  Lisinopril 10 Mg Tabs (Lisinopril) .... One Daily 4)  Cpap 8 Cm 5)  Carvedilol 12.5 Mg Tabs (Carvedilol) .... One Twice A Day 6)  Carvedilol 3.125 Mg Tabs (Carvedilol) .... One Twice A Day 7)  Simvastatin 40 Mg Tabs (Simvastatin) .Marland Kitchen.. 1 By Mouth At Bedtime  Allergies (verified): No Known Drug Allergies  Past History:  Past Medical History: Reviewed history from 05/01/2009 and no changes required. COLONIC POLYPS (ICD-211.3) GERD (ICD-530.81) DIVERTICULITIS, COLON (ICD-562.11) CHICKENPOX, HX OF (ICD-V15.9) Hypertension (recent) Hyperlipidemia (recent) Cardiomyopathy (EF 30 - 40%) Sleep Apnea  Past Surgical History: Reviewed history from 01/31/2009 and no changes required. Tonsils and adenoids 1960 Colonoscopy, 01/2009, Dr. Mechele Collin, + polyps  Review of Systems       As stated in the HPI and negative for all other systems.   Vital Signs:  Patient profile:   50 year old male Height:      70 inches Weight:      245 pounds BMI:     35.28 Pulse rate:   64 / minute Resp:     16 per minute BP sitting:   115 / 68  (right  arm)  Vitals Entered By: Marrion Coy, CNA (November 26, 2009 4:19 PM)  Physical Exam  General:  Well developed, well nourished, in no acute distress. Head:  normocephalic and atraumatic Neck:  supple; no masses; trachea midline Chest Wall:  no deformities or breast masses noted Lungs:  Clear bilaterally to auscultation and percussion. Heart:  Normal rate and regular rhythm. S1 and S2 normal without gallop, murmur, click, rub or other extra sounds. Abdomen:  Bowel sounds positive; abdomen soft and non-tender without masses, organomegaly, or hernias noted. No hepatosplenomegaly. Msk:  Back normal, normal gait. Muscle strength and tone normal. Extremities:  No clubbing or cyanosis. Neurologic:  Alert and oriented x 3. Cervical Nodes:  no cervical adenopathy Inguinal Nodes:  no inguinal adenopathy Psych:  Normal affect.   Impression & Recommendations:  Problem # 1:  PALPITATIONS (ICD-785.1) He may be feeling his PACs and occasional PVCs but these are not particularly symptomatic and no further therapy is indicated.  Problem # 2:  CARDIOMYOPATHY (ICD-425.4) Today I will titrate his carvedilol to 18.75 mg b.i.d.  Problem # 3:  OBESITY, UNSPECIFIED (ICD-278.00) We again had a discussion about the need to lose weight with diet and exercise.  Patient Instructions: 1)  Your physician recommends that you schedule a follow-up appointment in: 1 month with Dr Nicholas Gray 2)  Your physician has recommended you make the following change in your medication: Increase carvedilol to 6.25 tablets one  twice a day for a total  for 18.75  mg twice a day Prescriptions: CARVEDILOL 6.25 MG TABS (CARVEDILOL) one twice a day  #60 x 11   Entered by:   Charolotte Capuchin, RN   Authorized by:   Rollene Rotunda, MD, [facility]   Signed by:   Charolotte Capuchin, RN on 11/26/2009   Method used:   [facility]        [facility]  [place] [facility]       [address]       [place], [place]   [address]       Ph: [phone] or [phone]       Fax: [phone]   RxID:   [id number]

## 2010-05-07 NOTE — Progress Notes (Signed)
Summary: refill meds  Phone Note Refill Request Call back at Home Phone [phone] Message from:  Patient on July 26, 2009 4:16 PM  Refills Requested: Medication #1:  CARVEDILOL 6.25 MG TABS one two times a day. [facility] [place]    Method Requested: Fax to Local Pharmacy Initial call taken by: Lorne Skeens,  July 26, 2009 4:16 PM  Follow-up for Phone Call        Rx faxed to pharmacy Follow-up by: Oswald Hillock,  July 26, 2009 4:18 PM    Prescriptions: CARVEDILOL 6.25 MG TABS (CARVEDILOL) one two times a day  #60 x 3   Entered by:   Oswald Hillock   Authorized by:   Rollene Rotunda, MD, [facility]   Signed by:   Oswald Hillock on 07/26/2009   Method used:   [facility]        [facility]  [place] ([facility]ail)       [address]       [place], [place]  [address]       Ph: [phone] or [phone]       Fax: [phone]   RxID:   [id number]

## 2010-05-07 NOTE — Miscellaneous (Signed)
Summary: pulmonary referral for sleep apnea  pfh,rn  Clinical Lists Changes  Orders: Added new Referral order of Pulmonary Referral (Pulmonary) - Signed

## 2010-05-09 NOTE — Assessment & Plan Note (Signed)
Summary: 3 MONTH ROV.SL   Visit Type:  Follow-up Primary Provider:  Hannah Beat MD  CC:  Cardiomyopathy.  History of Present Illness: The patient presents for followup of his cardiomyopathy. He was slightly lightheaded previously so I didn't titrate his meds. However, he's been watching his blood pressure recently and notices the systolics in the 130s. He is having no shortness of breath, PND or orthopnea. He is having no chest pain, neck or arm discomfort. The remaining outside.  Current Medications (verified): 1)  Prilosec Otc 20 Mg Tbec (Omeprazole Magnesium) .... Take One Tab By Mouth Once Daily 2)  Multivitamins  Tabs (Multiple Vitamin) .... Take One Tab By Mouth Once Daily 3)  Lisinopril 10 Mg Tabs (Lisinopril) .... One Daily 4)  Cpap 8 Cm 5)  Carvedilol 12.5 Mg Tabs (Carvedilol) .... One Twice A Day 6)  Carvedilol 6.25 Mg Tabs (Carvedilol) .... One Twice A Day 7)  Simvastatin 40 Mg Tabs (Simvastatin) .Marland Kitchen.. 1 By Mouth At Bedtime  Allergies (verified): No Known Drug Allergies  Past History:  Past Medical History: Reviewed history from 05/01/2009 and no changes required. COLONIC POLYPS (ICD-211.3) GERD (ICD-530.81) DIVERTICULITIS, COLON (ICD-562.11) CHICKENPOX, HX OF (ICD-V15.9) Hypertension (recent) Hyperlipidemia (recent) Cardiomyopathy (EF 30 - 40%) Sleep Apnea  Past Surgical History: Reviewed history from 01/31/2009 and no changes required. Tonsils and adenoids 1960 Colonoscopy, 01/2009, Dr. Mechele Collin, + polyps  Review of Systems       As stated in the HPI and negative for all other systems.   Vital Signs:  Patient profile:   50 year old male Height:      70 inches Weight:      244 pounds BMI:     35.14 Pulse rate:   61 / minute Resp:     16 per minute BP sitting:   132 / 82  (right arm)  Vitals Entered By: Marrion Coy, CNA (March 22, 2010 4:29 PM)  Physical Exam  General:  Well developed, well nourished, in no acute distress. Head:   normocephalic and atraumatic Neck:  supple; no masses; trachea midline Chest Wall:  no deformities or breast masses noted Lungs:  Clear bilaterally to auscultation and percussion. Heart:  Normal rate and regular rhythm. S1 and S2 normal without gallop, murmur, click, rub or other extra sounds. Abdomen:  Bowel sounds positive; abdomen soft and non-tender without masses, organomegaly, or hernias noted. No hepatosplenomegaly. Msk:  Back normal, normal gait. Muscle strength and tone normal. Pulses:  pulses normal in all 4 extremities Extremities:  No clubbing or cyanosis. Neurologic:  Alert and oriented x 3. Skin:  seborrheic keratosis Cervical Nodes:  no cervical adenopathy Psych:  Normal affect.   Impression & Recommendations:  Problem # 1:  CARDIOMYOPATHY (ICD-425.4) Today I will titrate his carvedilol to 25 mg b.i.d. He will continue the other meds as listed.  Problem # 2:  OBESITY, UNSPECIFIED (ICD-278.00) We continued to discuss the need for weight loss with diet and exercise.  Problem # 3:  HYPERTENSION (ICD-401.9) His blood pressures being managed in the context of treating his cardiomyopathy.  Patient Instructions: 1)  Your physician recommends that you schedule a follow-up appointment in: 4 months with Dr Antoine Poche 2)  Your physician recommends that you continue on your current medications as directed. Please refer to the Current Medication list given to you today. Prescriptions: CARVEDILOL 25 MG TABS (CARVEDILOL) one twice a day  #60 x 6   Entered by:   Charolotte Capuchin, RN   Authorized by:  Rollene Rotunda, MD, [facility]   Signed by:   Charolotte Capuchin, RN on 03/22/2010   Method used:   [facility]        [facility]  [place] ([facility]       [address]       , [place]  [address]       Ph: [phone] or [phone]       Fax: [phone]   RxID:   [id number]  I have reviewed and approved all prescriptions at the time of this  visit. Rollene Rotunda, MD, [facility]  March 22, 2010 5:40 PM

## 2010-07-09 LAB — BASIC METABOLIC PANEL
CO2: 25 mEq/L (ref 19–32)
Calcium: 8.6 mg/dL (ref 8.4–10.5)
Chloride: 98 mEq/L (ref 96–112)
Creatinine, Ser: 0.92 mg/dL (ref 0.4–1.5)
Creatinine, Ser: 1.07 mg/dL (ref 0.4–1.5)
Glucose, Bld: 104 mg/dL — ABNORMAL HIGH (ref 70–99)
Potassium: 3.3 mEq/L — ABNORMAL LOW (ref 3.5–5.1)
Sodium: 134 mEq/L — ABNORMAL LOW (ref 135–145)

## 2010-07-09 LAB — DIFFERENTIAL
Eosinophils Relative: 4 % (ref 0–5)
Lymphocytes Relative: 29 % (ref 12–46)
Lymphs Abs: 1.8 10*3/uL (ref 0.7–4.0)

## 2010-07-09 LAB — LIPID PANEL
Total CHOL/HDL Ratio: 6.5 RATIO
VLDL: 46 mg/dL — ABNORMAL HIGH (ref 0–40)

## 2010-07-09 LAB — POCT CARDIAC MARKERS
Myoglobin, poc: 53.9 ng/mL (ref 12–200)
Troponin i, poc: <0.05 ng/mL (ref 0.00–0.09)

## 2010-07-09 LAB — CBC
HCT: 41.6 % (ref 39.0–52.0)
MCHC: 34.3 g/dL (ref 30.0–36.0)
MCV: 92.6 fL (ref 78.0–100.0)
Platelets: 190 10*3/uL (ref 150–400)
Platelets: 206 10*3/uL (ref 150–400)
WBC: 6.1 10*3/uL (ref 4.0–10.5)

## 2010-07-09 LAB — BRAIN NATRIURETIC PEPTIDE: Pro B Natriuretic peptide (BNP): <30 pg/mL (ref 0.0–100.0)

## 2010-07-09 LAB — APTT: aPTT: 29 seconds (ref 24–37)

## 2010-07-09 LAB — CARDIAC PANEL(CRET KIN+CKTOT+MB+TROPI)
CK, MB: 1.1 ng/mL (ref 0.3–4.0)
Total CK: 117 U/L (ref 7–232)
Total CK: 144 U/L (ref 7–232)

## 2010-07-17 ENCOUNTER — Encounter: Payer: Self-pay | Admitting: Cardiology

## 2010-07-21 ENCOUNTER — Encounter: Payer: Self-pay | Admitting: Cardiology

## 2010-07-22 ENCOUNTER — Encounter: Payer: Self-pay | Admitting: Cardiology

## 2010-07-22 ENCOUNTER — Ambulatory Visit (INDEPENDENT_AMBULATORY_CARE_PROVIDER_SITE_OTHER): Payer: 59 | Admitting: Cardiology

## 2010-07-22 VITALS — BP 118/77 | HR 55 | Resp 18 | Ht 70.0 in | Wt 246.1 lb

## 2010-07-22 DIAGNOSIS — I428 Other cardiomyopathies: Secondary | ICD-10-CM

## 2010-07-22 DIAGNOSIS — G4730 Sleep apnea, unspecified: Secondary | ICD-10-CM

## 2010-07-22 DIAGNOSIS — R002 Palpitations: Secondary | ICD-10-CM

## 2010-07-22 DIAGNOSIS — E669 Obesity, unspecified: Secondary | ICD-10-CM

## 2010-07-22 NOTE — Assessment & Plan Note (Signed)
He is having no symptoms related to this. No change in therapy is indicated.

## 2010-07-22 NOTE — Assessment & Plan Note (Signed)
I discussed with him the importance of treating this and he will work on getting his CPAP operational.

## 2010-07-22 NOTE — Progress Notes (Signed)
HPI The patient presents for followup of his cardiomyopathy. At his last visit I increased his Coreg to 25 mg bid.  He did well with this and he denies any new cardiovascular symptoms.  In particular he denies any new shortness of breath, PND or orthopnea. He has had no palpitations, presyncope or syncope. He denies any chest pressure, neck or arm discomfort. He has had no new weight gain or edema. He does report that his CPAP has not been working and he has abandoned the because he doesn't want to talk to the company as he has some unpaid bills.  No Known Allergies  Current Outpatient Prescriptions  Medication Sig Dispense Refill  . carvedilol (COREG) 25 MG tablet Take 25 mg by mouth 2 (two) times daily with a meal.        . lisinopril (PRINIVIL,ZESTRIL) 10 MG tablet Take 10 mg by mouth daily.        . Multiple Vitamin (MULTIVITAMIN) tablet Take 1 tablet by mouth daily.        Marland Kitchen omeprazole (PRILOSEC OTC) 20 MG tablet Take 20 mg by mouth daily.        . simvastatin (ZOCOR) 40 MG tablet Take 40 mg by mouth at bedtime.          Past Medical History  Diagnosis Date  . Colonic polyp   . GERD (gastroesophageal reflux disease)   . Diverticulosis of colon   . History of chicken pox   . HTN (hypertension)     recent  . HLD (hyperlipidemia)     recent  . Cardiomyopathy     EF 30-40% (Normal coronaries cath 2010)  . Sleep apnea     CPAP    Past Surgical History  Procedure Date  . Tonsillectomy and adenoidectomy 1960    ROS: As stated in the HPI and negative for all other systems.  PHYSICAL EXAM BP 118/77  Pulse 55  Resp 18  Ht 5\' 10"  (1.778 m)  Wt 246 lb 1.9 oz (111.639 kg)  BMI 35.31 kg/m2 GENERAL:  Well appearing HEENT:  Pupils equal round and reactive, fundi not visualized, oral mucosa unremarkable NECK:  No jugular venous distention, waveform within normal limits, carotid upstroke brisk and symmetric, no bruits, no thyromegaly LYMPHATICS:  No cervical, inguinal  adenopathy LUNGS:  Clear to auscultation bilaterally BACK:  No CVA tenderness CHEST:  Unremarkable HEART:  PMI not displaced or sustained,S1 and S2 within normal limits, no S3, no S4, no clicks, no rubs, no murmurs ABD:  Flat, positive bowel sounds normal in frequency in pitch, no bruits, no rebound, no guarding, no midline pulsatile mass, no hepatomegaly, no splenomegaly, obese EXT:  2 plus pulses throughout, no edema, no cyanosis no clubbing SKIN:  No rashes no nodules NEURO:  Cranial nerves II through XII grossly intact, motor grossly intact throughout PSYCH:  Cognitively intact, oriented to person place and time  ASSESSMENT AND PLAN

## 2010-07-22 NOTE — Assessment & Plan Note (Signed)
The patient understands the need to lose weight with diet and exercise. We have discussed specific strategies for this.  

## 2010-07-22 NOTE — Assessment & Plan Note (Signed)
His blood pressure would not allow hydration. We will continue meds as listed.

## 2010-07-22 NOTE — Patient Instructions (Signed)
Your physician recommends that you continue on your current medications as directed. Please refer to the Current Medication list given to you today.  Your physician wants you to follow-up in: 6 MONTHS. You will receive a reminder letter in the mail two months in advance. If you don't receive a letter, please call our office to schedule the follow-up appointment.  

## 2010-08-25 ENCOUNTER — Other Ambulatory Visit: Payer: Self-pay | Admitting: Cardiology

## 2010-10-30 ENCOUNTER — Other Ambulatory Visit: Payer: Self-pay | Admitting: Family Medicine

## 2010-10-30 ENCOUNTER — Other Ambulatory Visit: Payer: Self-pay | Admitting: Cardiology

## 2010-11-08 ENCOUNTER — Inpatient Hospital Stay (INDEPENDENT_AMBULATORY_CARE_PROVIDER_SITE_OTHER)
Admission: RE | Admit: 2010-11-08 | Discharge: 2010-11-08 | Disposition: A | Discharge status: Home / Self Care | Payer: BC Managed Care – PPO | Source: Ambulatory Visit | Attending: Emergency Medicine | Admitting: Emergency Medicine

## 2010-11-08 DIAGNOSIS — S61409A Unspecified open wound of unspecified hand, initial encounter: Secondary | ICD-10-CM

## 2010-11-13 ENCOUNTER — Other Ambulatory Visit (INDEPENDENT_AMBULATORY_CARE_PROVIDER_SITE_OTHER): Payer: BC Managed Care – PPO | Admitting: Family Medicine

## 2010-11-13 DIAGNOSIS — Z79899 Other long term (current) drug therapy: Secondary | ICD-10-CM

## 2010-11-13 DIAGNOSIS — Z125 Encounter for screening for malignant neoplasm of prostate: Secondary | ICD-10-CM

## 2010-11-13 DIAGNOSIS — Z13220 Encounter for screening for lipoid disorders: Secondary | ICD-10-CM

## 2010-11-13 LAB — BASIC METABOLIC PANEL
BUN: 22 mg/dL (ref 6–23)
CO2: 29 mEq/L (ref 19–32)
Chloride: 102 mEq/L (ref 96–112)
Glucose, Bld: 106 mg/dL — ABNORMAL HIGH (ref 70–99)
Potassium: 5 mEq/L (ref 3.5–5.1)

## 2010-11-13 LAB — HEPATIC FUNCTION PANEL
ALT: 18 U/L (ref 0–53)
AST: 16 U/L (ref 0–37)
Albumin: 4.4 g/dL (ref 3.5–5.2)
Alkaline Phosphatase: 42 U/L (ref 39–117)

## 2010-11-13 LAB — CBC WITH DIFFERENTIAL/PLATELET
Basophils Relative: 0.5 % (ref 0.0–3.0)
Eosinophils Relative: 3.7 % (ref 0.0–5.0)
Lymphocytes Relative: 23.8 % (ref 12.0–46.0)
MCV: 91.1 fl (ref 78.0–100.0)
Monocytes Relative: 9.4 % (ref 3.0–12.0)
Neutrophils Relative %: 62.6 % (ref 43.0–77.0)
RBC: 4.12 Mil/uL — ABNORMAL LOW (ref 4.22–5.81)
WBC: 6.8 10*3/uL (ref 4.5–10.5)

## 2010-11-13 LAB — LIPID PANEL
LDL Cholesterol: 54 mg/dL (ref 0–99)
Total CHOL/HDL Ratio: 4
VLDL: 26.2 mg/dL (ref 0.0–40.0)

## 2010-11-13 LAB — PSA: PSA: 0.44 ng/mL (ref 0.10–4.00)

## 2010-11-15 ENCOUNTER — Inpatient Hospital Stay (INDEPENDENT_AMBULATORY_CARE_PROVIDER_SITE_OTHER)
Admission: RE | Admit: 2010-11-15 | Discharge: 2010-11-15 | Disposition: A | Discharge status: Home / Self Care | Payer: BC Managed Care – PPO | Source: Ambulatory Visit | Attending: Emergency Medicine | Admitting: Emergency Medicine

## 2010-11-15 DIAGNOSIS — Z4802 Encounter for removal of sutures: Secondary | ICD-10-CM

## 2010-11-20 ENCOUNTER — Encounter: Payer: Self-pay | Admitting: Family Medicine

## 2010-11-20 ENCOUNTER — Ambulatory Visit (INDEPENDENT_AMBULATORY_CARE_PROVIDER_SITE_OTHER): Payer: BC Managed Care – PPO | Admitting: Family Medicine

## 2010-11-20 VITALS — BP 120/72 | HR 65 | Temp 98.6°F | Ht 70.0 in | Wt 240.0 lb

## 2010-11-20 DIAGNOSIS — E669 Obesity, unspecified: Secondary | ICD-10-CM

## 2010-11-20 DIAGNOSIS — G4730 Sleep apnea, unspecified: Secondary | ICD-10-CM

## 2010-11-20 DIAGNOSIS — I10 Essential (primary) hypertension: Secondary | ICD-10-CM

## 2010-11-20 DIAGNOSIS — E785 Hyperlipidemia, unspecified: Secondary | ICD-10-CM

## 2010-11-20 DIAGNOSIS — I428 Other cardiomyopathies: Secondary | ICD-10-CM

## 2010-11-20 DIAGNOSIS — Z0000 Encounter for general adult medical examination without abnormal findings: Secondary | ICD-10-CM

## 2010-11-20 NOTE — Patient Instructions (Signed)
Call Dr. Mechele Collin: ask about when next colonoscopy - probably in 2013

## 2010-11-20 NOTE — Progress Notes (Signed)
Subjective:    Patient ID: Nicholas Gray, male    DOB: [birth year], 50 y.o.   MRN: [id number]  HPI  Nicholas Gray, a 50 y.o. male presents today in the office for the following:    CPX, generally doing well  Preventative Health Maintenance Visit:  Health Maintenance Summary Reviewed and updated, unless pt declines services.  Tobacco History Reviewed. 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 Tetanus/tdap  05/22/1978  . Influenza Vaccine  01/06/2011  . Colonoscopy  01/15/2012    Labs reviewed with the patient.   Lipids:    Component Value Date/Time   CHOL 108 11/13/2010 0849   TRIG 131.0 11/13/2010 0849   HDL 27.70* 11/13/2010 0849   LDLDIRECT 161.2 10/19/2008 0946   VLDL 26.2 11/13/2010 0849   CHOLHDL 4 11/13/2010 0849    CBC:    Component Value Date/Time   WBC 6.8 11/13/2010 0849   HGB 12.8* 11/13/2010 0849   HCT 37.5* 11/13/2010 0849   PLT 205.0 11/13/2010 0849   MCV 91.1 11/13/2010 0849   NEUTROABS 4.3 11/13/2010 0849   LYMPHSABS 1.6 11/13/2010 0849   MONOABS 0.6 11/13/2010 0849   EOSABS 0.2 11/13/2010 0849   BASOSABS 0.0 11/13/2010 0849    Basic Metabolic Panel:    Component Value Date/Time   NA 139 11/13/2010 0849   K 5.0 11/13/2010 0849   CL 102 11/13/2010 0849   CO2 29 11/13/2010 0849   BUN 22 11/13/2010 0849   CREATININE 1.3 11/13/2010 0849   GLUCOSE 106* 11/13/2010 0849   CALCIUM 9.2 11/13/2010 0849    Lab Results  Component Value Date   ALT 18 11/13/2010   AST 16 11/13/2010   ALKPHOS 42 11/13/2010   BILITOT 0.7 11/13/2010    Lab Results  Component Value Date   PSA 0.44 11/13/2010   PSA 0.67 10/29/2009   PSA 0.42 10/19/2008   O/w doing well -- feels fairly well  The PMH, PSH, Social History, Family History, Medications, and allergies have been reviewed in [facility], and have been updated  if relevant.  Review of Systems     General: Denies fever, chills, sweats. No significant weight loss. Eyes: Denies blurring,significant itching ENT: Denies earache, sore throat, and hoarseness. Cardiovascular: Denies chest pains, palpitations, dyspnea on exertion Respiratory: Denies cough, dyspnea at rest,wheeezing GI: Denies nausea, vomiting, diarrhea, constipation, change in bowel habits, abdominal pain, melena, hematochezia GU: Denies penile discharge, ED. Occaisional urinary flow / outflow problems. No STD concerns. Musculoskeletal: Denies back pain, joint pain Derm: Denies rash, itching Neuro: Denies  Neuro sx Psych: Denies depression, anxiety Endocrine: Denies cold intolerance Heme: Denies enlarged lymph nodes Allergy: No hayfever   Objective:   Physical Exam   Physical Exam  Blood pressure 120/72, pulse 65, temperature 98.6 F (37 C), temperature source Oral, height 5\' 10"  (1.778 m), weight 240 lb (108.863 kg), SpO2 98.00%.  PE: GEN: well developed, well nourished, no acute distress Eyes: conjunctiva and lids normal, PERRLA, EOMI ENT: TM clear, nares clear, oral exam WNL Neck: supple, no lymphadenopathy, no thyromegaly, no JVD Pulm: clear to auscultation and percussion, respiratory effort normal CV: regular rate and rhythm, S1-S2, no murmur, rub or gallop, no bruits, peripheral pulses normal and symmetric, no cyanosis, clubbing, edema or varicosities Chest: no scars, masses, no gynecomastia   GI:  soft, non-tender; no hepatosplenomegaly, masses; active bowel sounds all quadrants GU: no hernia, testicular mass, penile discharge, priapism or prostate enlargement Lymph: no cervical, axillary or inguinal adenopathy MSK: gait normal, muscle tone and strength WNL, no joint swelling, effusions, discoloration, crepitus  SKIN: clear, good turgor, color WNL, no rashes, lesions, or ulcerations Neuro: normal mental status, normal strength, sensation, and motion Psych: alert;  oriented to person, place and time, normally interactive and not anxious or depressed in appearance.       Assessment & Plan:   1. Routine general medical examination at a health care facility : The patient's preventative maintenance and recommended screening tests for an annual wellness exam were reviewed in full today. Brought up to date unless services declined.  Counselled on the importance of diet, exercise, and its role in overall health and mortality. The patient's FH and SH was reviewed, including their home life, tobacco status, and drug and alcohol status.   Will work on weight, exercise Pt. will check with Dr. Earnest Conroy office about colon timing  2. HYPERTENSION   3. CARDIOMYOPATHY   4. HYPERLIPIDEMIA   5. Obesity, unspecified   6. SLEEP APNEA

## 2011-01-16 ENCOUNTER — Encounter: Payer: Self-pay | Admitting: Cardiology

## 2011-01-16 ENCOUNTER — Ambulatory Visit (INDEPENDENT_AMBULATORY_CARE_PROVIDER_SITE_OTHER): Payer: BC Managed Care – PPO | Admitting: Cardiology

## 2011-01-16 DIAGNOSIS — G4730 Sleep apnea, unspecified: Secondary | ICD-10-CM

## 2011-01-16 DIAGNOSIS — E669 Obesity, unspecified: Secondary | ICD-10-CM

## 2011-01-16 DIAGNOSIS — I428 Other cardiomyopathies: Secondary | ICD-10-CM

## 2011-01-16 DIAGNOSIS — I10 Essential (primary) hypertension: Secondary | ICD-10-CM

## 2011-01-16 NOTE — Progress Notes (Signed)
HPI The patient presents for followup of his cardiomyopathy. At his last visit I increased his Coreg to 25 mg bid.  He did well with this and he denies any new cardiovascular symptoms.  In particular he denies any new shortness of breath, PND or orthopnea. He has had no palpitations, presyncope or syncope. He denies any chest pressure, neck or arm discomfort. He has had no new weight gain or edema. He has been having a cough productive of a yellow green sputum but no fevers chills.  He still has not fixed his CPAP.    No Known Allergies  Current Outpatient Prescriptions  Medication Sig Dispense Refill  . carvedilol (COREG) 25 MG tablet Take 25 mg by mouth 2 (two) times daily with a meal.        . lisinopril (PRINIVIL,ZESTRIL) 10 MG tablet TAKE 1 TABLET BY MOUTH DAILY  90 tablet  5  . omeprazole (PRILOSEC OTC) 20 MG tablet Take 20 mg by mouth daily.        . simvastatin (ZOCOR) 40 MG tablet TAKE 1 TABLET BY MOUTH AT BEDTIME  30 tablet  11    Past Medical History  Diagnosis Date  . Colonic polyp   . GERD (gastroesophageal reflux disease)   . Diverticulosis of colon   . History of chicken pox   . HTN (hypertension)     recent  . HLD (hyperlipidemia)     recent  . Cardiomyopathy     EF 30-40% (Normal coronaries cath 2010)  . Sleep apnea     CPAP    Past Surgical History  Procedure Date  . Tonsillectomy and adenoidectomy 1960    ROS: As stated in the HPI and negative for all other systems.  PHYSICAL EXAM BP 134/80  Pulse 72  Ht 5\' 10"  (1.778 m)  Wt 237 lb (107.502 kg)  BMI 34.01 kg/m2 GENERAL:  Well appearing HEENT:  Pupils equal round and reactive, fundi not visualized, oral mucosa unremarkable NECK:  No jugular venous distention, waveform within normal limits, carotid upstroke brisk and symmetric, no bruits, no thyromegaly LYMPHATICS:  No cervical, inguinal adenopathy LUNGS:  Clear to auscultation bilaterally BACK:  No CVA tenderness CHEST:  Unremarkable HEART:  PMI not  displaced or sustained,S1 and S2 within normal limits, no S3, no S4, no clicks, no rubs, no murmurs ABD:  Flat, positive bowel sounds normal in frequency in pitch, no bruits, no rebound, no guarding, no midline pulsatile mass, no hepatomegaly, no splenomegaly, obese EXT:  2 plus pulses throughout, no edema, no cyanosis no clubbing SKIN:  No rashes no nodules NEURO:  Cranial nerves II through XII grossly intact, motor grossly intact throughout PSYCH:  Cognitively intact, oriented to person place and time  ASSESSMENT AND PLAN

## 2011-01-16 NOTE — Assessment & Plan Note (Signed)
He understands the need to lose weight with diet and exercise. 

## 2011-01-16 NOTE — Assessment & Plan Note (Signed)
He will continue on the meds as listed.  I will check an echo to see if his EF has improved with med titration.

## 2011-01-16 NOTE — Assessment & Plan Note (Signed)
His blood pressure is controlled.

## 2011-01-16 NOTE — Assessment & Plan Note (Signed)
I again encourage him to get his CPAP fixed.

## 2011-01-16 NOTE — Patient Instructions (Signed)
The current medical regimen is effective;  continue present plan and medications.  Your physician has requested that you have an echocardiogram. Echocardiography is a painless test that uses sound waves to create images of your heart. It provides your doctor with information about the size and shape of your heart and how well your heart's chambers and valves are working. This procedure takes approximately one hour. There are no restrictions for this procedure.  Follow up in 6 months with Dr Hochrein.  You will receive a letter in the mail 2 months before you are due.  Please call us when you receive this letter to schedule your follow up appointment.  

## 2011-01-27 ENCOUNTER — Encounter: Payer: Self-pay | Admitting: *Deleted

## 2011-01-28 ENCOUNTER — Ambulatory Visit (HOSPITAL_COMMUNITY): Payer: BC Managed Care – PPO | Attending: Cardiology | Admitting: Radiology

## 2011-01-28 DIAGNOSIS — I079 Rheumatic tricuspid valve disease, unspecified: Secondary | ICD-10-CM | POA: Insufficient documentation

## 2011-01-28 DIAGNOSIS — I428 Other cardiomyopathies: Secondary | ICD-10-CM

## 2011-01-28 DIAGNOSIS — I379 Nonrheumatic pulmonary valve disorder, unspecified: Secondary | ICD-10-CM | POA: Insufficient documentation

## 2011-01-28 DIAGNOSIS — I059 Rheumatic mitral valve disease, unspecified: Secondary | ICD-10-CM | POA: Insufficient documentation

## 2011-01-29 ENCOUNTER — Encounter: Payer: Self-pay | Admitting: Family Medicine

## 2011-01-29 ENCOUNTER — Ambulatory Visit (INDEPENDENT_AMBULATORY_CARE_PROVIDER_SITE_OTHER): Payer: BC Managed Care – PPO | Admitting: Family Medicine

## 2011-01-29 VITALS — BP 120/78 | HR 76 | Temp 97.7°F | Ht 70.0 in | Wt 237.1 lb

## 2011-01-29 DIAGNOSIS — J0100 Acute maxillary sinusitis, unspecified: Secondary | ICD-10-CM

## 2011-01-29 DIAGNOSIS — Z23 Encounter for immunization: Secondary | ICD-10-CM

## 2011-01-29 MED ORDER — AMOXICILLIN 500 MG PO CAPS: 1500.0000 mg | ORAL_CAPSULE | Freq: Two times a day (BID) | ORAL | Status: AC

## 2011-01-29 NOTE — Patient Instructions (Signed)
SINUSITIS Sinuses are cavities in facial skeleton that drain to nose. Impaired drainage and obstruction of sinus passages main cause.  Treatment: 1. Take all Antibiotics 3. Steam inhalation 4. Humidifier in room 5. Frequent nasal saline irrigation 6. Moist heat compresses to face 7. Tylenol or Ibuprofen for pain and fever, follow directions on bottle.  

## 2011-01-29 NOTE — Progress Notes (Signed)
  Subjective:    Patient ID: Nicholas Gray, male    DOB: 11-24-1960, 50 y.o.   MRN: [id number]  HPI  Nicholas Gray, a 50 y.o. male presents today in the office for the following:    Pressure causing teeth to hurt. Sick for 2 weeks. All sinuses. Facial pain. The patient was sick for 2 weeks, may start to feel little bit better, and then he developed some facial pain and pain around his maxillary sinuses. He still has some drainage without blood. No significant cough at this point. He does have some ear fullness. No sore throat. No nausea, vomiting, or diarrhea. He is afebrile and without chills.  The PMH, PSH, Social History, Family History, Medications, and allergies have been reviewed in [facility], and have been updated if relevant.   Review of Systems ROS: GEN: Acute illness details above GI: Tolerating PO intake GU: maintaining adequate hydration and urination Pulm: No SOB Interactive and getting along well at home.  Otherwise, ROS is as per the HPI.     Objective:   Physical Exam   Physical Exam  Blood pressure 120/78, pulse 76, temperature 97.7 F (36.5 C), temperature source Oral, height 5\' 10"  (1.778 m), weight 237 lb 1.9 oz (107.557 kg), SpO2 99.00%.  Gen: WDWN, NAD; alert,appropriate and cooperative throughout exam  HEENT: Normocephalic and atraumatic. Throat clear, w/o exudate, no LAD, R TM clear, L TM - good landmarks, No fluid present. rhinnorhea.  Left frontal and maxillary sinuses: Tender, max Right frontal and maxillary sinuses: Tender, max  Neck: No ant or post LAD CV: RRR, No M/G/R Pulm: Breathing comfortably in no resp distress. no w/c/r Extr: no c/c/e Psych: full affect, pleasant      Assessment & Plan:   1. Sinusitis, acute maxillary  amoxicillin (AMOXIL) 500 MG capsule  2. Flu vaccine need  Flu vaccine greater than or equal to 3yo preservative free IM    A/P: 1. Acute sinusitis: ABX as below.  Refer to the patient instructions sections for  details of plan shared with patient.  Reviewed symptomatic care as well as ABX in this case.

## 2011-02-04 ENCOUNTER — Other Ambulatory Visit: Payer: Self-pay | Admitting: *Deleted

## 2011-02-04 DIAGNOSIS — I5022 Chronic systolic (congestive) heart failure: Secondary | ICD-10-CM

## 2011-02-04 MED ORDER — LISINOPRIL 10 MG PO TABS: 10.0000 mg | ORAL_TABLET | Freq: Two times a day (BID) | ORAL | Status: DC

## 2011-02-07 ENCOUNTER — Telehealth: Payer: Self-pay | Admitting: Family Medicine

## 2011-02-07 NOTE — Telephone Encounter (Signed)
Patient advised.

## 2011-02-07 NOTE — Telephone Encounter (Signed)
Congestion can take a while (weeks) to resolve after sinus infection. As long as no fever or worsening facial pain...antibiotics do not need to be changed. Complete antibiotic course. Use nasal saline spray in nostrils for irrigation 3-4 times  A day and consider mucinex (plain) to break up mucus as well.

## 2011-02-07 NOTE — Telephone Encounter (Signed)
Pt continues to experience mucous while on amoxicillin and is questioning if he needs a refill or another prescription.  His call back number is [phone].

## 2011-02-20 ENCOUNTER — Other Ambulatory Visit (INDEPENDENT_AMBULATORY_CARE_PROVIDER_SITE_OTHER): Payer: BC Managed Care – PPO

## 2011-02-20 DIAGNOSIS — I5022 Chronic systolic (congestive) heart failure: Secondary | ICD-10-CM

## 2011-02-21 LAB — BASIC METABOLIC PANEL
CO2: 26 mEq/L (ref 19–32)
Calcium: 9.1 mg/dL (ref 8.4–10.5)
GFR: 68.3 mL/min (ref >60.00)
Sodium: 136 mEq/L (ref 135–145)

## 2011-03-27 ENCOUNTER — Telehealth: Payer: Self-pay | Admitting: Cardiology

## 2011-03-27 DIAGNOSIS — I5022 Chronic systolic (congestive) heart failure: Secondary | ICD-10-CM

## 2011-03-27 MED ORDER — LISINOPRIL 10 MG PO TABS: 10.0000 mg | ORAL_TABLET | Freq: Two times a day (BID) | ORAL | Status: DC

## 2011-03-27 NOTE — Telephone Encounter (Signed)
New msg Pt wants a new rx for lisinopril 10 mg twice a day to be sent to [facility] pharmacy in [place].

## 2011-06-02 ENCOUNTER — Other Ambulatory Visit: Payer: Self-pay

## 2011-06-02 MED ORDER — CARVEDILOL 25 MG PO TABS: 25.0000 mg | ORAL_TABLET | Freq: Two times a day (BID) | ORAL | Status: DC

## 2011-06-03 ENCOUNTER — Other Ambulatory Visit: Payer: Self-pay | Admitting: *Deleted

## 2011-07-21 ENCOUNTER — Ambulatory Visit: Payer: BC Managed Care – PPO | Admitting: Cardiology

## 2011-08-12 ENCOUNTER — Ambulatory Visit (INDEPENDENT_AMBULATORY_CARE_PROVIDER_SITE_OTHER): Payer: BC Managed Care – PPO | Admitting: Cardiology

## 2011-08-12 ENCOUNTER — Encounter: Payer: Self-pay | Admitting: Cardiology

## 2011-08-12 VITALS — BP 140/80 | HR 74 | Resp 18 | Ht 70.0 in | Wt 249.4 lb

## 2011-08-12 DIAGNOSIS — E669 Obesity, unspecified: Secondary | ICD-10-CM

## 2011-08-12 DIAGNOSIS — I5022 Chronic systolic (congestive) heart failure: Secondary | ICD-10-CM

## 2011-08-12 DIAGNOSIS — G4730 Sleep apnea, unspecified: Secondary | ICD-10-CM

## 2011-08-12 DIAGNOSIS — I10 Essential (primary) hypertension: Secondary | ICD-10-CM

## 2011-08-12 DIAGNOSIS — I428 Other cardiomyopathies: Secondary | ICD-10-CM

## 2011-08-12 MED ORDER — LISINOPRIL 10 MG PO TABS: 20.0000 mg | ORAL_TABLET | Freq: Two times a day (BID) | ORAL | Status: DC

## 2011-08-12 NOTE — Progress Notes (Signed)
   HPI The patient presents for followup of his cardiomyopathy. Since I last saw him he has done well. He tolerated an increased ACE inhibitor dose. The patient denies any new symptoms such as chest discomfort, neck or arm discomfort. There has been no new shortness of breath, PND or orthopnea. There have been no reported palpitations, presyncope or syncope.  He still has not fixed his CPAP.    No Known Allergies  Current Outpatient Prescriptions  Medication Sig Dispense Refill  . carvedilol (COREG) 25 MG tablet Take 1 tablet (25 mg total) by mouth 2 (two) times daily with a meal.  60 tablet  6  . lisinopril (PRINIVIL,ZESTRIL) 10 MG tablet Take 1 tablet (10 mg total) by mouth 2 (two) times daily.  180 tablet  1  . omeprazole (PRILOSEC OTC) 20 MG tablet Take 20 mg by mouth daily.        . simvastatin (ZOCOR) 40 MG tablet TAKE 1 TABLET BY MOUTH AT BEDTIME  30 tablet  11    Past Medical History  Diagnosis Date  . Colonic polyp   . GERD (gastroesophageal reflux disease)   . Diverticulosis of colon   . History of chicken pox   . HTN (hypertension)     recent  . HLD (hyperlipidemia)     recent  . Cardiomyopathy     EF 30-40% (Normal coronaries cath 2010)  . Sleep apnea     CPAP    Past Surgical History  Procedure Date  . Tonsillectomy and adenoidectomy 1960    ROS: As stated in the HPI and negative for all other systems.  PHYSICAL EXAM BP 140/80  Pulse 74  Resp 18  Ht 5\' 10"  (1.778 m)  Wt 249 lb 6.4 oz (113.127 kg)  BMI 35.79 kg/m2 GENERAL:  Well appearing HEENT:  Pupils equal round and reactive, fundi not visualized, oral mucosa unremarkable NECK:  No jugular venous distention, waveform within normal limits, carotid upstroke brisk and symmetric, no bruits, no thyromegaly LYMPHATICS:  No cervical, inguinal adenopathy LUNGS:  Clear to auscultation bilaterally BACK:  No CVA tenderness CHEST:  Unremarkable HEART:  PMI not displaced or sustained,S1 and S2 within normal  limits, no S3, no S4, no clicks, no rubs, no murmurs ABD:  Flat, positive bowel sounds normal in frequency in pitch, no bruits, no rebound, no guarding, no midline pulsatile mass, no hepatomegaly, no splenomegaly, obese EXT:  2 plus pulses throughout, no edema, no cyanosis no clubbing SKIN:  No rashes no nodules NEURO:  Cranial nerves II through XII grossly intact, motor grossly intact throughout PSYCH:  Cognitively intact, oriented to person place and time  ASSESSMENT AND PLAN

## 2011-08-12 NOTE — Assessment & Plan Note (Signed)
We had this discussion again.

## 2011-08-12 NOTE — Assessment & Plan Note (Signed)
This is being managed in the context of treating his CHF  

## 2011-08-12 NOTE — Assessment & Plan Note (Signed)
I continue to ask him to get his CPAP fixed.

## 2011-08-12 NOTE — Assessment & Plan Note (Signed)
EF remains 30%.  I will up titrate his Lisinopril to 20 mg bid.

## 2011-08-12 NOTE — Patient Instructions (Signed)
Please increase Lisinopril to 20 mg twice a day  Continue all other medications as listed  Follow up in 6 months with Dr Antoine Poche

## 2011-10-20 ENCOUNTER — Other Ambulatory Visit: Payer: Self-pay | Admitting: *Deleted

## 2011-10-20 MED ORDER — SIMVASTATIN 40 MG PO TABS: 40.0000 mg | ORAL_TABLET | Freq: Every day | ORAL | Status: DC

## 2011-10-20 NOTE — Telephone Encounter (Signed)
Received faxed refill request from pharmacy. Refill sent to pharmacy electronically. 

## 2011-10-21 ENCOUNTER — Other Ambulatory Visit: Payer: Self-pay | Admitting: *Deleted

## 2011-10-21 DIAGNOSIS — I5022 Chronic systolic (congestive) heart failure: Secondary | ICD-10-CM

## 2011-10-21 MED ORDER — LISINOPRIL 10 MG PO TABS: 20.0000 mg | ORAL_TABLET | Freq: Two times a day (BID) | ORAL | Status: DC

## 2011-10-29 ENCOUNTER — Other Ambulatory Visit: Payer: Self-pay | Admitting: Family Medicine

## 2011-12-27 ENCOUNTER — Other Ambulatory Visit: Payer: Self-pay | Admitting: Cardiology

## 2011-12-29 NOTE — Telephone Encounter (Signed)
..   Requested Prescriptions   Pending Prescriptions Disp Refills  . carvedilol (COREG) 25 MG tablet [Pharmacy Med Name: CARVEDILOL 25 MG TABLET] 60 tablet 4    Sig: TAKE 1 TABLET (25 MG TOTAL) BY MOUTH 2 (TWO) TIMES DAILY WITH A MEAL.  Marland Kitchen.Patient needs to contact office to schedule  Appointment  For 6 months follow up.Ph:[phone]. Thank you.

## 2012-01-01 ENCOUNTER — Other Ambulatory Visit: Payer: Self-pay | Admitting: Family Medicine

## 2012-01-01 DIAGNOSIS — Z125 Encounter for screening for malignant neoplasm of prostate: Secondary | ICD-10-CM

## 2012-01-01 DIAGNOSIS — Z79899 Other long term (current) drug therapy: Secondary | ICD-10-CM

## 2012-01-01 DIAGNOSIS — E785 Hyperlipidemia, unspecified: Secondary | ICD-10-CM

## 2012-01-08 ENCOUNTER — Other Ambulatory Visit (INDEPENDENT_AMBULATORY_CARE_PROVIDER_SITE_OTHER): Payer: BC Managed Care – PPO

## 2012-01-08 DIAGNOSIS — E785 Hyperlipidemia, unspecified: Secondary | ICD-10-CM

## 2012-01-08 DIAGNOSIS — Z125 Encounter for screening for malignant neoplasm of prostate: Secondary | ICD-10-CM

## 2012-01-08 DIAGNOSIS — Z79899 Other long term (current) drug therapy: Secondary | ICD-10-CM

## 2012-01-08 LAB — BASIC METABOLIC PANEL
BUN: 21 mg/dL (ref 6–23)
Calcium: 9.1 mg/dL (ref 8.4–10.5)
Chloride: 100 mEq/L (ref 96–112)
Creatinine, Ser: 1.3 mg/dL (ref 0.4–1.5)

## 2012-01-08 LAB — LIPID PANEL
LDL Cholesterol: 52 mg/dL (ref 0–99)
Total CHOL/HDL Ratio: 5
VLDL: 38.2 mg/dL (ref 0.0–40.0)

## 2012-01-08 LAB — HEPATIC FUNCTION PANEL
AST: 19 U/L (ref 0–37)
Alkaline Phosphatase: 52 U/L (ref 39–117)
Bilirubin, Direct: 0.1 mg/dL (ref 0.0–0.3)
Total Protein: 7.1 g/dL (ref 6.0–8.3)

## 2012-01-08 LAB — CBC WITH DIFFERENTIAL/PLATELET
Basophils Relative: 0.6 % (ref 0.0–3.0)
Eosinophils Absolute: 0.2 10*3/uL (ref 0.0–0.7)
Eosinophils Relative: 2.9 % (ref 0.0–5.0)
Hemoglobin: 12.7 g/dL — ABNORMAL LOW (ref 13.0–17.0)
Lymphocytes Relative: 26.4 % (ref 12.0–46.0)
MCHC: 34 g/dL (ref 30.0–36.0)
Monocytes Relative: 10.5 % (ref 3.0–12.0)
Neutrophils Relative %: 59.6 % (ref 43.0–77.0)
RBC: 4.1 Mil/uL — ABNORMAL LOW (ref 4.22–5.81)
WBC: 6 10*3/uL (ref 4.5–10.5)

## 2012-01-08 LAB — PSA: PSA: 0.53 ng/mL (ref 0.10–4.00)

## 2012-01-14 ENCOUNTER — Encounter: Payer: Self-pay | Admitting: Family Medicine

## 2012-01-14 ENCOUNTER — Ambulatory Visit (INDEPENDENT_AMBULATORY_CARE_PROVIDER_SITE_OTHER): Payer: BC Managed Care – PPO | Admitting: Family Medicine

## 2012-01-14 VITALS — BP 118/64 | HR 60 | Temp 98.2°F | Ht 69.75 in | Wt 246.0 lb

## 2012-01-14 DIAGNOSIS — Z0000 Encounter for general adult medical examination without abnormal findings: Secondary | ICD-10-CM

## 2012-01-14 DIAGNOSIS — D126 Benign neoplasm of colon, unspecified: Secondary | ICD-10-CM

## 2012-01-14 DIAGNOSIS — K635 Polyp of colon: Secondary | ICD-10-CM

## 2012-01-14 DIAGNOSIS — Z1211 Encounter for screening for malignant neoplasm of colon: Secondary | ICD-10-CM

## 2012-01-14 NOTE — Progress Notes (Signed)
[facility] at [facility] [address] [place] [address] Phone: [phone] Fax: [phone]  Date:  01/14/2012   Name:  Nicholas Gray   DOB:  Jul 05, 1960   MRN:  [id number] Gender: male Age: 51 y.o.  PCP:  Hannah Beat, MD    Chief Complaint: Annual Exam   History of Present Illness:  Nicholas Gray is a 51 y.o. pleasant patient who presents with the following:  Wt Readings from Last 3 Encounters:  01/14/12 246 lb (111.585 kg)  08/12/11 249 lb 6.4 oz (113.127 kg)  01/29/11 237 lb 1.9 oz (107.557 kg)   Pleasant gentleman who is obese, but has lost about 30 pounds or so over the last few years, and also has some significant cardiomyopathy but is well controlled with medical management, sleep apnea, LEFT bundle branch block.  He also had an abnormal polyp in 2010, path reviewed from Dr. Mechele Collin in [place].  Otherwise, he basically is feeling very well.  Preventative Health Maintenance Visit:  Health Maintenance Summary Reviewed and updated, unless pt declines services.  Tobacco History Reviewed. Alcohol: No concerns, no excessive use Exercise Habits: none STD concerns: no risk or activity to increase risk Drug Use: None Encouraged self-testicular check  Health Maintenance  Topic Date Due  . Tetanus/tdap  05/22/1978  . Influenza Vaccine  12/07/2011  . Colonoscopy  01/15/2012    Labs reviewed with the patient.  Results for orders placed in visit on 01/08/12  LIPID PANEL      Component Value Range   Cholesterol 116  0 - 200 mg/dL   Triglycerides 244.0 (*) 0.0 - 149.0 mg/dL   HDL 10.27 (*) >25.36 mg/dL   VLDL 64.4  0.0 - 03.4 mg/dL   LDL Cholesterol 52  0 - 99 mg/dL   Total CHOL/HDL Ratio 5    CBC WITH DIFFERENTIAL      Component Value Range   WBC 6.0  4.5 - 10.5 K/uL   RBC 4.10 (*) 4.22 - 5.81 Mil/uL   Hemoglobin 12.7 (*) 13.0 - 17.0 g/dL   HCT 74.2 (*) 59.5 - 63.8 %   MCV 90.9  78.0 - 100.0 fl   MCHC 34.0  30.0 - 36.0 g/dL   RDW 75.6  43.3 - 29.5 %   Platelets 200.0  150.0 - 400.0 K/uL   Neutrophils Relative 59.6  43.0 - 77.0 %   Lymphocytes Relative 26.4  12.0 - 46.0 %   Monocytes Relative 10.5  3.0 - 12.0 %   Eosinophils Relative 2.9  0.0 - 5.0 %   Basophils Relative 0.6  0.0 - 3.0 %   Neutro Abs 3.6  1.4 - 7.7 K/uL   Lymphs Abs 1.6  0.7 - 4.0 K/uL   Monocytes Absolute 0.6  0.1 - 1.0 K/uL   Eosinophils Absolute 0.2  0.0 - 0.7 K/uL   Basophils Absolute 0.0  0.0 - 0.1 K/uL  HEPATIC FUNCTION PANEL      Component Value Range   Total Bilirubin 0.5  0.3 - 1.2 mg/dL   Bilirubin, Direct 0.1  0.0 - 0.3 mg/dL   Alkaline Phosphatase 52  39 - 117 U/L   AST 19  0 - 37 U/L   ALT 18  0 - 53 U/L   Total Protein 7.1  6.0 - 8.3 g/dL   Albumin 4.3  3.5 - 5.2 g/dL  BASIC METABOLIC PANEL      Component Value Range   Sodium 134 (*) 135 - 145  mEq/L   Potassium 4.9  3.5 - 5.1 mEq/L   Chloride 100  96 - 112 mEq/L   CO2 26  19 - 32 mEq/L   Glucose, Bld 98  70 - 99 mg/dL   BUN 21  6 - 23 mg/dL   Creatinine, Ser 1.3  0.4 - 1.5 mg/dL   Calcium 9.1  8.4 - 16.1 mg/dL   GFR 09.60  >45.40 mL/min  PSA      Component Value Range   PSA 0.53  0.10 - 4.00 ng/mL     Patient Active Problem List  Diagnosis  . COLONIC POLYPS  . HYPERLIPIDEMIA  . OBESITY, UNSPECIFIED  . HYPERTENSION  . CARDIOMYOPATHY  . LEFT BUNDLE BRANCH BLOCK  . CHF  . GERD  . DIVERTICULITIS, COLON  . ACTINIC KERATOSIS  . SLEEP APNEA  . CHICKENPOX, HX OF    Past Medical History  Diagnosis Date  . Colonic polyp   . GERD (gastroesophageal reflux disease)   . Diverticulosis of colon   . History of chicken pox   . HTN (hypertension)     recent  . HLD (hyperlipidemia)     recent  . Cardiomyopathy     EF 30-40% (Normal coronaries cath 2010)  . Sleep apnea     CPAP    Past Surgical History  Procedure Date  . Tonsillectomy and adenoidectomy 1960    History  Substance Use Topics  . Smoking status: Former Smoker -- 1.5 packs/day for 3 years      Quit date: 04/07/1980  . Smokeless tobacco: Not on file  . Alcohol Use: No    Family History  Problem Relation Age of Onset  . Arthritis      family history  . Diabetes      family history  . Hyperlipidemia      family history  . Lung cancer      family history  . Stroke      family history    No Known Allergies  Medication list has been reviewed and updated.  Outpatient Prescriptions Prior to Visit  Medication Sig Dispense Refill  . carvedilol (COREG) 25 MG tablet TAKE 1 TABLET (25 MG TOTAL) BY MOUTH 2 (TWO) TIMES DAILY WITH A MEAL.  60 tablet  4  . lisinopril (PRINIVIL,ZESTRIL) 10 MG tablet Take 2 tablets (20 mg total) by mouth 2 (two) times daily.  180 tablet  3  . omeprazole (PRILOSEC OTC) 20 MG tablet Take 20 mg by mouth daily.        . simvastatin (ZOCOR) 40 MG tablet TAKE 1 TABLET BY MOUTH AT BEDTIME  30 tablet  11  . simvastatin (ZOCOR) 40 MG tablet Take 1 tablet (40 mg total) by mouth at bedtime.  30 tablet  0    Review of Systems:   General: Denies fever, chills, sweats. No significant weight loss. Eyes: Denies blurring,significant itching ENT: Denies earache, sore throat, and hoarseness. Cardiovascular: Denies chest pains, palpitations, dyspnea on exertion Respiratory: Denies cough, dyspnea at rest,wheeezing Breast: no concerns about lumps GI: Denies nausea, vomiting, diarrhea, constipation, change in bowel habits, abdominal pain, melena, hematochezia GU: Denies penile discharge, ED, urinary flow / outflow problems. No STD concerns. Musculoskeletal: Denies back pain, joint pain Derm: Denies rash, itching Neuro: Denies  paresthesias, frequent falls, frequent headaches Psych: Denies depression, anxiety Endocrine: Denies cold intolerance, heat intolerance, polydipsia Heme: Denies enlarged lymph nodes Allergy: No hayfever   Physical Examination: Filed Vitals:   01/14/12 1432  BP: 118/64  Pulse: 60  Temp: 98.2 F (36.8 C)   Filed Vitals:    01/14/12 1432  Height: 5' 9.75" (1.772 m)  Weight: 246 lb (111.585 kg)   Body mass index is 35.55 kg/(m^2). Ideal Body Weight: Weight in (lb) to have BMI = 25: 172.6    Wt Readings from Last 3 Encounters:  01/14/12 246 lb (111.585 kg)  08/12/11 249 lb 6.4 oz (113.127 kg)  01/29/11 237 lb 1.9 oz (107.557 kg)    GEN: well developed, well nourished, no acute distress Eyes: conjunctiva and lids normal, PERRLA, EOMI ENT: TM clear, nares clear, oral exam WNL Neck: supple, no lymphadenopathy, no thyromegaly, no JVD Pulm: clear to auscultation and percussion, respiratory effort normal CV: regular rate and rhythm, S1-S2, no murmur, rub or gallop, no bruits, peripheral pulses normal and symmetric, no cyanosis, clubbing, edema or varicosities Chest: no scars, masses GI: soft, non-tender; no hepatosplenomegaly, masses; active bowel sounds all quadrants GU: no hernia, testicular mass, penile discharge, or prostate enlargement Lymph: no cervical, axillary or inguinal adenopathy MSK: gait normal, muscle tone and strength WNL, no joint swelling, effusions, discoloration, crepitus  SKIN: clear, good turgor, color WNL, no rashes, lesions, or ulcerations Neuro: normal mental status, normal strength, sensation, and motion Psych: alert; oriented to person, place and time, normally interactive and not anxious or depressed in appearance.   Assessment and Plan:  1. Special screening for malignant neoplasm of colon  Ambulatory referral to Gastroenterology  2. Colon polyps  Ambulatory referral to Gastroenterology  3. Routine general medical examination at a health care facility     The patient's preventative maintenance and recommended screening tests for an annual wellness exam were reviewed in full today. Brought up to date unless services declined. Flu shot at work.  Counselled on the importance of diet, exercise, and its role in overall health and mortality. The patient's FH and SH was reviewed,  including their home life, tobacco status, and drug and alcohol status.   Keep working on [employer] and exercise  Orders Today:  Orders Placed This Encounter  Procedures  . Ambulatory referral to Gastroenterology    Referral Priority:  Routine    Referral Type:  Consultation    Referral Reason:  Specialty Services Required    Requested Specialty:  Gastroenterology    Number of Visits Requested:  1    Updated Medication List: (Includes new medications, updates to list, dose adjustments) No orders of the defined types were placed in this encounter.    Medications Discontinued: Medications Discontinued During This Encounter  Medication Reason  . simvastatin (ZOCOR) 40 MG tablet Error     Hannah Beat, MD

## 2012-01-14 NOTE — Patient Instructions (Addendum)
REFERRAL: GO THE THE FRONT ROOM AT THE ENTRANCE OF OUR CLINIC, NEAR CHECK IN. ASK FOR MARION. SHE WILL HELP YOU SET UP YOUR REFERRAL. DATE: TIME:  

## 2012-02-12 ENCOUNTER — Encounter: Payer: Self-pay | Admitting: Cardiology

## 2012-02-12 ENCOUNTER — Ambulatory Visit (INDEPENDENT_AMBULATORY_CARE_PROVIDER_SITE_OTHER): Payer: BC Managed Care – PPO | Admitting: Cardiology

## 2012-02-12 VITALS — BP 119/76 | HR 65 | Ht 70.0 in | Wt 245.8 lb

## 2012-02-12 DIAGNOSIS — I428 Other cardiomyopathies: Secondary | ICD-10-CM

## 2012-02-12 DIAGNOSIS — G4730 Sleep apnea, unspecified: Secondary | ICD-10-CM

## 2012-02-12 DIAGNOSIS — E669 Obesity, unspecified: Secondary | ICD-10-CM

## 2012-02-12 NOTE — Patient Instructions (Addendum)
The current medical regimen is effective;  continue present plan and medications.  Follow up in 1 year with Dr Hochrein.  You will receive a letter in the mail 2 months before you are due.  Please call us when you receive this letter to schedule your follow up appointment.  

## 2012-02-12 NOTE — Progress Notes (Signed)
   HPI The patient presents for followup of his cardiomyopathy. Since I last saw him he has done well. He tolerated an increased ACE inhibitor dose. The patient denies any new symptoms such as chest discomfort, neck or arm discomfort. There has been no new shortness of breath, PND or orthopnea. There have been no reported palpitations, presyncope or syncope.  He is not exercising as I would like.   No Known Allergies  Current Outpatient Prescriptions  Medication Sig Dispense Refill  . carvedilol (COREG) 25 MG tablet TAKE 1 TABLET (25 MG TOTAL) BY MOUTH 2 (TWO) TIMES DAILY WITH A MEAL.  60 tablet  4  . lisinopril (PRINIVIL,ZESTRIL) 10 MG tablet Take 2 tablets (20 mg total) by mouth 2 (two) times daily.  180 tablet  3  . omeprazole (PRILOSEC OTC) 20 MG tablet Take 20 mg by mouth daily.        . simvastatin (ZOCOR) 40 MG tablet TAKE 1 TABLET BY MOUTH AT BEDTIME  30 tablet  11    Past Medical History  Diagnosis Date  . Colonic polyp   . GERD (gastroesophageal reflux disease)   . Diverticulosis of colon   . History of chicken pox   . HTN (hypertension)     recent  . HLD (hyperlipidemia)     recent  . Cardiomyopathy     EF 30-40% (Normal coronaries cath 2010)  . Sleep apnea     CPAP    Past Surgical History  Procedure Date  . Tonsillectomy and adenoidectomy 1960    ROS: As stated in the HPI and negative for all other systems.  PHYSICAL EXAM BP 119/76  Pulse 65  Ht 5\' 10"  (1.778 m)  Wt 245 lb 12.8 oz (111.494 kg)  BMI 35.27 kg/m2 GENERAL:  Well appearing NECK:  No jugular venous distention, waveform within normal limits, carotid upstroke brisk and symmetric, no bruits, no thyromegaly LUNGS:  Clear to auscultation bilaterally BACK:  No CVA tenderness CHEST:  Unremarkable HEART:  PMI not displaced or sustained,S1 and S2 within normal limits, no S3, no S4, no clicks, no rubs, no murmurs ABD:  Flat, positive bowel sounds normal in frequency in pitch, no bruits, no rebound, no  guarding, no midline pulsatile mass, no hepatomegaly, no splenomegaly, obese EXT:  2 plus pulses throughout, no edema, no cyanosis no clubbing  ASSESSMENT AND PLAN  CARDIOMYOPATHY -  He is on a reasonable medical regimen and is euvolemic. I will make no change to his regimen but we'll followup an echocardiogram when I see him next year. He has class I symptoms.  HYPERTENSION -  This is being managed in the context of treating his CHF   OBESITY, UNSPECIFIED -  We had this discussion again.  SLEEP APNEA -  He has no intention of wearing the CPAP.

## 2012-02-21 ENCOUNTER — Other Ambulatory Visit: Payer: Self-pay | Admitting: Cardiology

## 2012-02-23 NOTE — Telephone Encounter (Signed)
..   Requested Prescriptions   Signed Prescriptions Disp Refills  . lisinopril (PRINIVIL,ZESTRIL) 10 MG tablet 180 tablet 3    Sig: TAKE 2 TABLETS (20 MG TOTAL) BY MOUTH 2 (TWO) TIMES DAILY.    Authorizing Provider: Rollene Rotunda    Ordering User: Christella Hartigan, Yordan Martindale Judie Petit

## 2012-04-30 ENCOUNTER — Ambulatory Visit: Payer: Self-pay | Admitting: Unknown Physician Specialty

## 2012-04-30 LAB — HM COLONOSCOPY

## 2012-05-03 LAB — PATHOLOGY REPORT

## 2012-05-22 ENCOUNTER — Other Ambulatory Visit: Payer: Self-pay

## 2012-06-01 ENCOUNTER — Encounter: Payer: Self-pay | Admitting: Family Medicine

## 2012-06-03 ENCOUNTER — Other Ambulatory Visit: Payer: Self-pay | Admitting: Cardiology

## 2012-09-07 ENCOUNTER — Other Ambulatory Visit: Payer: Self-pay | Admitting: Cardiology

## 2012-09-08 NOTE — Telephone Encounter (Signed)
..   Requested Prescriptions   Pending Prescriptions Disp Refills  . lisinopril (PRINIVIL,ZESTRIL) 10 MG tablet [Pharmacy Med Name: LISINOPRIL 10 MG TABLET] 180 tablet 6    Sig: TAKE 2 TABLETS (20 MG TOTAL) BY MOUTH 2 (TWO) TIMES DAILY.

## 2012-11-20 ENCOUNTER — Other Ambulatory Visit: Payer: Self-pay | Admitting: Family Medicine

## 2012-12-15 ENCOUNTER — Other Ambulatory Visit: Payer: Self-pay

## 2012-12-15 MED ORDER — CARVEDILOL 25 MG PO TABS: ORAL_TABLET | ORAL | Status: DC

## 2012-12-16 ENCOUNTER — Other Ambulatory Visit: Payer: Self-pay | Admitting: Cardiology

## 2013-01-06 ENCOUNTER — Encounter (HOSPITAL_COMMUNITY): Payer: Self-pay | Admitting: *Deleted

## 2013-01-06 ENCOUNTER — Emergency Department (HOSPITAL_COMMUNITY)
Admission: EM | Admit: 2013-01-06 | Discharge: 2013-01-06 | Disposition: A | Discharge status: Home / Self Care | Payer: Medicaid Other | Attending: Emergency Medicine | Admitting: Emergency Medicine

## 2013-01-06 DIAGNOSIS — I10 Essential (primary) hypertension: Secondary | ICD-10-CM | POA: Insufficient documentation

## 2013-01-06 DIAGNOSIS — G4730 Sleep apnea, unspecified: Secondary | ICD-10-CM | POA: Insufficient documentation

## 2013-01-06 DIAGNOSIS — Z87891 Personal history of nicotine dependence: Secondary | ICD-10-CM | POA: Insufficient documentation

## 2013-01-06 DIAGNOSIS — M109 Gout, unspecified: Secondary | ICD-10-CM | POA: Insufficient documentation

## 2013-01-06 DIAGNOSIS — Z8619 Personal history of other infectious and parasitic diseases: Secondary | ICD-10-CM | POA: Insufficient documentation

## 2013-01-06 DIAGNOSIS — Z860100 Personal history of colon polyps, unspecified: Secondary | ICD-10-CM | POA: Insufficient documentation

## 2013-01-06 DIAGNOSIS — Z79899 Other long term (current) drug therapy: Secondary | ICD-10-CM | POA: Insufficient documentation

## 2013-01-06 DIAGNOSIS — K219 Gastro-esophageal reflux disease without esophagitis: Secondary | ICD-10-CM | POA: Insufficient documentation

## 2013-01-06 DIAGNOSIS — E785 Hyperlipidemia, unspecified: Secondary | ICD-10-CM | POA: Insufficient documentation

## 2013-01-06 LAB — URIC ACID: Uric Acid, Serum: 8.3 mg/dL — ABNORMAL HIGH (ref 4.0–7.8)

## 2013-01-06 MED ORDER — NAPROXEN SODIUM 550 MG PO TABS: ORAL_TABLET | ORAL | Status: DC

## 2013-01-06 MED ORDER — IBUPROFEN 800 MG PO TABS
800.0000 mg | ORAL_TABLET | Freq: Once | ORAL | Status: AC
  Administered 2013-01-06: 800 mg via ORAL
  Filled 2013-01-06: qty 1

## 2013-01-06 MED ORDER — HYDROCODONE-ACETAMINOPHEN 5-325 MG PO TABS: 1.0000 | ORAL_TABLET | Freq: Four times a day (QID) | ORAL | Status: DC | PRN

## 2013-01-06 NOTE — ED Notes (Signed)
Pt states that he has been having left foot pain for over a week.

## 2013-01-06 NOTE — ED Provider Notes (Signed)
CSN: [id number]     Arrival date & time 01/06/13  0459 History   First MD Initiated Contact with Patient 01/06/13 0530     Chief Complaint  Patient presents with  . Foot Pain   (Consider location/radiation/quality/duration/timing/severity/associated sxs/prior Treatment) HPI This is a 52 year old male with a one-week history of pain at the base of his left great toe. This acutely worsened overnight and is now moderate to severe. It is severe enough to prevent him from sleeping. It is worse with palpation or movement as well as ambulation. He denies trauma. There is some erythema and swelling associated with the pain. He has never had a history of similar symptoms or of gout.  Past Medical History  Diagnosis Date  . Colonic polyp   . GERD (gastroesophageal reflux disease)   . Diverticulosis of colon   . History of chicken pox   . HTN (hypertension)     recent  . HLD (hyperlipidemia)     recent  . Cardiomyopathy     EF 30-40% (Normal coronaries cath 2010)  . Sleep apnea     CPAP   Past Surgical History  Procedure Laterality Date  . Tonsillectomy and adenoidectomy  1960   Family History  Problem Relation Age of Onset  . Arthritis      family history  . Diabetes      family history  . Hyperlipidemia      family history  . Lung cancer      family history  . Stroke      family history   History  Substance Use Topics  . Smoking status: Former Smoker -- 1.50 packs/day for 3 years    Quit date: 04/07/1980  . Smokeless tobacco: Not on file  . Alcohol Use: No    Review of Systems  All other systems reviewed and are negative.    Allergies  Review of patient's allergies indicates no known allergies.  Home Medications   Current Outpatient Rx  Name  Route  Sig  Dispense  Refill  . acetaminophen (TYLENOL) 325 MG tablet   Oral   Take 650 mg by mouth daily as needed for pain.         . carvedilol (COREG) 25 MG tablet   Oral   Take 25 mg by mouth 2 (two) times daily  with a meal.         . lisinopril (PRINIVIL,ZESTRIL) 10 MG tablet   Oral   Take 20 mg by mouth 2 (two) times daily.         Marland Kitchen omeprazole (PRILOSEC) 20 MG capsule   Oral   Take 20 mg by mouth daily.         . simvastatin (ZOCOR) 40 MG tablet   Oral   Take 40 mg by mouth at bedtime.          BP 147/68  Pulse 68  Temp(Src) 97.9 F (36.6 C) (Oral)  Resp 16  Ht 5\' 10"  (1.778 m)  Wt 255 lb (115.667 kg)  BMI 36.59 kg/m2  SpO2 98%  Physical Exam General: Well-developed, well-nourished male in no acute distress; appearance consistent with age of record HENT: normocephalic; atraumatic Eyes: pupils equal, round and reactive to light; extraocular muscles intact Neck: supple Heart: regular rate and rhythm Lungs: clear to auscultation bilaterally Abdomen: soft; nondistended Extremities: No deformity; pulses normal; tenderness, swelling and mild erythema of the left first metatarsophalangeal joint with pain on passive range of motion and reduced active range of  motion Neurologic: Awake, alert and oriented; motor function intact in all extremities and symmetric; no facial droop Skin: Warm and dry Psychiatric: Normal mood and affect    ED Course  Procedures (including critical care time)  MDM  Symptoms and examination consistent with gout. Will draw uric acid level for his primary care physician followup.    Hanley Seamen, MD 01/06/13 [phone]

## 2013-02-10 ENCOUNTER — Other Ambulatory Visit: Payer: Self-pay

## 2013-02-15 ENCOUNTER — Encounter: Payer: Self-pay | Admitting: Cardiology

## 2013-02-15 ENCOUNTER — Ambulatory Visit (INDEPENDENT_AMBULATORY_CARE_PROVIDER_SITE_OTHER): Payer: Medicaid Other | Admitting: Cardiology

## 2013-02-15 VITALS — BP 161/92 | HR 50 | Ht 70.0 in | Wt 251.0 lb

## 2013-02-15 DIAGNOSIS — I10 Essential (primary) hypertension: Secondary | ICD-10-CM

## 2013-02-15 DIAGNOSIS — I509 Heart failure, unspecified: Secondary | ICD-10-CM

## 2013-02-15 MED ORDER — CARVEDILOL 25 MG PO TABS: 25.0000 mg | ORAL_TABLET | Freq: Two times a day (BID) | ORAL | Status: DC

## 2013-02-15 NOTE — Patient Instructions (Signed)
Your physician wants you to follow-up in: 1 YEAR WITH DR. HOCHREIN You will receive a reminder letter in the mail two months in advance. If you don't receive a letter, please call our office to schedule the follow-up appointment.  Your physician recommends that you continue on your current medications as directed. Please refer to the Current Medication list given to you today.   

## 2013-02-15 NOTE — Progress Notes (Signed)
HPI The patient presents for followup of his cardiomyopathy. Since I last saw him he has done well.The patient denies any new symptoms such as chest discomfort, neck or arm discomfort. There has been no new shortness of breath, PND or orthopnea. There have been no reported palpitations, presyncope or syncope.  He is not exercising.  He does chores of daily living.  He is not following his diet.  He a ate a Cajun fillet for breakfast.    No Known Allergies  Current Outpatient Prescriptions  Medication Sig Dispense Refill  . acetaminophen (TYLENOL) 325 MG tablet Take 650 mg by mouth daily as needed for pain.      Marland Kitchen allopurinol (ZYLOPRIM) 100 MG tablet Take 100 mg by mouth daily.      . carvedilol (COREG) 25 MG tablet Take 25 mg by mouth 2 (two) times daily with a meal.      . colchicine 0.6 MG tablet Take 0.6 mg by mouth daily.      . indomethacin (INDOCIN) 25 MG capsule Take 25 mg by mouth 2 (two) times daily with a meal.      . lisinopril (PRINIVIL,ZESTRIL) 10 MG tablet Take 20 mg by mouth 2 (two) times daily.      . naproxen sodium (ANAPROX DS) 550 MG tablet Take 1 tablet twice daily for gout pain. Best taken with a meal.  30 tablet  0  . omeprazole (PRILOSEC) 20 MG capsule Take 20 mg by mouth daily.      . simvastatin (ZOCOR) 40 MG tablet Take 40 mg by mouth at bedtime.       No current facility-administered medications for this visit.    Past Medical History  Diagnosis Date  . Colonic polyp   . GERD (gastroesophageal reflux disease)   . Diverticulosis of colon   . History of chicken pox   . HTN (hypertension)     recent  . HLD (hyperlipidemia)     recent  . Cardiomyopathy     EF 30-40% (Normal coronaries cath 2010)  . Sleep apnea     CPAP    Past Surgical History  Procedure Laterality Date  . Tonsillectomy and adenoidectomy  1960    ROS: As stated in the HPI and negative for all other systems.  PHYSICAL EXAM BP 161/92  Pulse 50  Ht 5\' 10"  (1.778 m)  Wt 251 lb  (113.853 kg)  BMI 36.01 kg/m2 GENERAL:  Well appearing NECK:  No jugular venous distention, waveform within normal limits, carotid upstroke brisk and symmetric, no bruits, no thyromegaly LUNGS:  Clear to auscultation bilaterally BACK:  No CVA tenderness CHEST:  Unremarkable HEART:  PMI not displaced or sustained,S1 and S2 within normal limits, no S3, no S4, no clicks, no rubs, no murmurs ABD:  Flat, positive bowel sounds normal in frequency in pitch, no bruits, no rebound, no guarding, no midline pulsatile mass, no hepatomegaly, no splenomegaly, obese EXT:  2 plus pulses throughout, no edema, no cyanosis no clubbing  EKG:  Sinus rhythm, rate 50, axis within normal limits, intervals within normal limits, no acute ST-T wave changes.  02/15/2013  ASSESSMENT AND PLAN  CARDIOMYOPATHY -  He is on a reasonable medical regimen and is euvolemic. I will make no change to his regimen.    HYPERTENSION -  His blood pressure is elevated. I have convinced him to get a blood pressure. I will hold off on starting another medication at his request but he needs to participate better in  risk reduction with dietary changes and weight loss.  OBESITY, UNSPECIFIED -  We had this discussion again.  He is thinking about getting a Nordic track  SLEEP APNEA -  He has no intention of wearing the CPAP.

## 2013-05-23 ENCOUNTER — Other Ambulatory Visit: Payer: Self-pay | Admitting: Cardiology

## 2013-05-23 ENCOUNTER — Other Ambulatory Visit: Payer: Self-pay | Admitting: *Deleted

## 2013-05-23 MED ORDER — LISINOPRIL 10 MG PO TABS: 20.0000 mg | ORAL_TABLET | Freq: Two times a day (BID) | ORAL | Status: DC

## 2013-08-23 ENCOUNTER — Other Ambulatory Visit: Payer: Self-pay | Admitting: Cardiology

## 2013-12-30 ENCOUNTER — Other Ambulatory Visit: Payer: Self-pay | Admitting: *Deleted

## 2013-12-30 MED ORDER — CARVEDILOL 25 MG PO TABS: ORAL_TABLET | ORAL | Status: DC

## 2014-01-26 ENCOUNTER — Other Ambulatory Visit: Payer: Self-pay | Admitting: Cardiology

## 2014-01-26 ENCOUNTER — Other Ambulatory Visit: Payer: Self-pay | Admitting: *Deleted

## 2014-01-26 MED ORDER — CARVEDILOL 25 MG PO TABS: ORAL_TABLET | ORAL | Status: DC

## 2014-03-03 ENCOUNTER — Other Ambulatory Visit: Payer: Self-pay | Admitting: Cardiology

## 2014-03-20 ENCOUNTER — Ambulatory Visit (INDEPENDENT_AMBULATORY_CARE_PROVIDER_SITE_OTHER): Payer: Managed Care, Other (non HMO) | Admitting: Cardiology

## 2014-03-20 ENCOUNTER — Encounter: Payer: Self-pay | Admitting: Cardiology

## 2014-03-20 VITALS — BP 104/68 | HR 62 | Ht 70.0 in | Wt 246.6 lb

## 2014-03-20 DIAGNOSIS — I420 Dilated cardiomyopathy: Secondary | ICD-10-CM

## 2014-03-20 DIAGNOSIS — I10 Essential (primary) hypertension: Secondary | ICD-10-CM

## 2014-03-20 DIAGNOSIS — I447 Left bundle-branch block, unspecified: Secondary | ICD-10-CM

## 2014-03-20 NOTE — Patient Instructions (Signed)
Your physician recommends that you schedule a follow-up appointment in: one year with Dr. Hochrein  We are ordering an Echo for you to get done   

## 2014-03-20 NOTE — Progress Notes (Signed)
HPI The patient presents for followup of his cardiomyopathy. Since I last saw him he has done well.The patient denies any new symptoms such as chest discomfort, neck or arm discomfort. There has been no new shortness of breath, PND or orthopnea. There have been no reported palpitations, presyncope or syncope.  He is not exercising.   He is not following his diet.  He is working at a new job as a Teacher, early years/pre.  He has had some significant left lower extremities swelling at times.  No Known Allergies  Current Outpatient Prescriptions  Medication Sig Dispense Refill  . acetaminophen (TYLENOL) 325 MG tablet Take 650 mg by mouth daily as needed for pain.    Marland Kitchen allopurinol (ZYLOPRIM) 100 MG tablet Take 100 mg by mouth daily.    . carvedilol (COREG) 25 MG tablet TAKE (1) TABLET BY MOUTH TWICE DAILY WITH A MEAL. 60 tablet 1  . colchicine 0.6 MG tablet Take 0.6 mg by mouth as needed.     Marland Kitchen lisinopril (PRINIVIL,ZESTRIL) 10 MG tablet TAKE 2 TABLETS BY MOUTH TWICE DAILY. 120 tablet 11  . naproxen sodium (ANAPROX DS) 550 MG tablet Take 1 tablet twice daily for gout pain. Best taken with a meal. 30 tablet 0  . omeprazole (PRILOSEC) 20 MG capsule Take 20 mg by mouth daily.    . simvastatin (ZOCOR) 40 MG tablet Take 40 mg by mouth at bedtime.     No current facility-administered medications for this visit.    Past Medical History  Diagnosis Date  . Colonic polyp   . GERD (gastroesophageal reflux disease)   . Diverticulosis of colon   . History of chicken pox   . HTN (hypertension)     recent  . HLD (hyperlipidemia)     recent  . Cardiomyopathy     EF 30-40% (Normal coronaries cath 2010)  . Sleep apnea     CPAP    Past Surgical History  Procedure Laterality Date  . Tonsillectomy and adenoidectomy  1960    ROS: As stated in the HPI and negative for all other systems.  PHYSICAL EXAM BP 104/68 mmHg  Pulse 62  Ht 5\' 10"  (1.778 m)  Wt 246 lb 9.6 oz (111.857 kg)  BMI 35.38  kg/m2 GENERAL:  Well appearing NECK:  No jugular venous distention, waveform within normal limits, carotid upstroke brisk and symmetric, no bruits, no thyromegaly LUNGS:  Clear to auscultation bilaterally BACK:  No CVA tenderness CHEST:  Unremarkable HEART:  PMI not displaced or sustained,S1 and S2 within normal limits, no S3, no S4, no clicks, no rubs, no murmurs ABD:  Flat, positive bowel sounds normal in frequency in pitch, no bruits, no rebound, no guarding, no midline pulsatile mass, no hepatomegaly, no splenomegaly, obese EXT:  2 plus pulses throughout, left leg greater than right edema, no cyanosis no clubbing  EKG:  Sinus rhythm, rate 62,  LBBB  03/20/2014  ASSESSMENT AND PLAN  CARDIOMYOPATHY -  He is on a reasonable medical regimen and is euvolemic. I will make no change to his regimen.   I will check an echo as it has been about 4 years.   HYPERTENSION -  The blood pressure is at target. No change in medications is indicated. We will continue with therapeutic lifestyle changes (TLC).  OBESITY, UNSPECIFIED -  We had this discussion again.  He has never started exercising as I have hoped.   SLEEP APNEA -  He has no intention of wearing the  CPAP.   EDEMA - I suspect that he has some venous stasis in the left leg. I have suggested compression stockings

## 2014-03-22 ENCOUNTER — Ambulatory Visit (HOSPITAL_COMMUNITY)
Admission: RE | Admit: 2014-03-22 | Discharge: 2014-03-22 | Disposition: A | Discharge status: Home / Self Care | Payer: Managed Care, Other (non HMO) | Source: Ambulatory Visit | Attending: Internal Medicine | Admitting: Internal Medicine

## 2014-03-22 DIAGNOSIS — I10 Essential (primary) hypertension: Secondary | ICD-10-CM | POA: Diagnosis not present

## 2014-03-22 DIAGNOSIS — G4730 Sleep apnea, unspecified: Secondary | ICD-10-CM | POA: Insufficient documentation

## 2014-03-22 DIAGNOSIS — I420 Dilated cardiomyopathy: Secondary | ICD-10-CM | POA: Insufficient documentation

## 2014-03-22 DIAGNOSIS — I340 Nonrheumatic mitral (valve) insufficiency: Secondary | ICD-10-CM | POA: Diagnosis not present

## 2014-03-22 DIAGNOSIS — I447 Left bundle-branch block, unspecified: Secondary | ICD-10-CM | POA: Diagnosis not present

## 2014-03-22 DIAGNOSIS — E785 Hyperlipidemia, unspecified: Secondary | ICD-10-CM | POA: Insufficient documentation

## 2014-03-22 DIAGNOSIS — I059 Rheumatic mitral valve disease, unspecified: Secondary | ICD-10-CM

## 2014-03-22 NOTE — Progress Notes (Signed)
2D Echo Performed 03/22/2014    Nicholas Gray, RCS

## 2014-03-27 ENCOUNTER — Encounter: Payer: Self-pay | Admitting: Cardiology

## 2014-04-03 ENCOUNTER — Other Ambulatory Visit: Payer: Self-pay | Admitting: Cardiology

## 2014-10-02 ENCOUNTER — Other Ambulatory Visit: Payer: Self-pay | Admitting: Cardiology

## 2015-01-02 ENCOUNTER — Encounter: Payer: Self-pay | Admitting: Cardiology

## 2015-02-12 ENCOUNTER — Ambulatory Visit: Payer: Managed Care, Other (non HMO) | Admitting: Cardiology

## 2015-02-13 ENCOUNTER — Encounter: Payer: Self-pay | Admitting: Cardiology

## 2015-02-13 ENCOUNTER — Ambulatory Visit (INDEPENDENT_AMBULATORY_CARE_PROVIDER_SITE_OTHER): Payer: Managed Care, Other (non HMO) | Admitting: Cardiology

## 2015-02-13 VITALS — BP 138/92 | HR 60 | Ht 70.0 in | Wt 250.6 lb

## 2015-02-13 DIAGNOSIS — I10 Essential (primary) hypertension: Secondary | ICD-10-CM | POA: Diagnosis not present

## 2015-02-13 NOTE — Patient Instructions (Signed)
Your physician wants you to follow-up in: 1 Year. You will receive a reminder letter in the mail two months in advance. If you don't receive a letter, please call our office to schedule the follow-up appointment.  You have been referred to Dr Claiborne Billings for sleep

## 2015-02-13 NOTE — Progress Notes (Addendum)
HPI The patient presents for followup of his cardiomyopathy. Since I last saw him he has done well.The patient denies any new symptoms such as chest discomfort, neck or arm discomfort. There has been no new shortness of breath, PND or orthopnea. There have been no reported palpitations, presyncope or syncope.  He is not exercising.   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    No Known Allergies  Current Outpatient Prescriptions  Medication Sig Dispense Refill  . acetaminophen (TYLENOL) 325 MG tablet Take 650 mg by mouth daily as needed for pain.    Marland Kitchen allopurinol (ZYLOPRIM) 100 MG tablet Take 100 mg by mouth 2 (two) times daily.     . carvedilol (COREG) 25 MG tablet TAKE (1) TABLET BY MOUTH TWICE DAILY WITH A MEAL. 60 tablet 3  . colchicine 0.6 MG tablet Take 0.6 mg by mouth as needed.     Marland Kitchen lisinopril (PRINIVIL,ZESTRIL) 10 MG tablet TAKE 2 TABLETS BY MOUTH TWICE DAILY. 120 tablet 11  . naproxen sodium (ANAPROX DS) 550 MG tablet Take 1 tablet twice daily for gout pain. Best taken with a meal. 30 tablet 0  . omeprazole (PRILOSEC) 20 MG capsule Take 20 mg by mouth daily.    . simvastatin (ZOCOR) 40 MG tablet Take 40 mg by mouth at bedtime.     No current facility-administered medications for this visit.    Past Medical History  Diagnosis Date  . Colonic polyp   . GERD (gastroesophageal reflux disease)   . Diverticulosis of colon   . History of chicken pox   . HTN (hypertension)     recent  . HLD (hyperlipidemia)     recent  . Cardiomyopathy     EF 30-40% (Normal coronaries cath 2010).  Echo 2015 EF 40%  . Sleep apnea     does not use CPAP.      Past Surgical History  Procedure Laterality Date  . Tonsillectomy and adenoidectomy      ROS: As stated in the HPI and negative for all other systems.  PHYSICAL EXAM BP 138/92 mmHg  Pulse 60  Ht 5\' 10"  (1.778 m)  Wt 250 lb 9.6 oz (113.671 kg)  BMI 35.96 kg/m2 GENERAL:  Well appearing NECK:  No jugular venous  distention, waveform within normal limits, carotid upstroke brisk and symmetric, no bruits, no thyromegaly LUNGS:  Clear to auscultation bilaterally BACK:  No CVA tenderness CHEST:  Unremarkable HEART:  PMI not displaced or sustained,S1 and S2 within normal limits, no S3, no S4, no clicks, no rubs, no murmurs ABD:  Flat, positive bowel sounds normal in frequency in pitch, no bruits, no rebound, no guarding, no midline pulsatile mass, no hepatomegaly, no splenomegaly, obese EXT:  2 plus pulses throughout, no edema, no cyanosis no clubbing  EKG:  Sinus rhythm, rate 60,  LBBB  02/13/2015  ASSESSMENT AND PLAN  CARDIOMYOPATHY -  His ultrasound demonstrated that his ejection fraction was actually slightly better previously. He seems to be euvolemic. No change in therapy or further imaging is indicated.  HYPERTENSION -  The blood pressure is at target. No change in medications is indicated. We will continue with therapeutic lifestyle changes (TLC).  OBESITY, UNSPECIFIED -  We had this discussion again.    SLEEP APNEA -  The patient needs to have another sleep study.  He has not been wearing his mask.  He has sleep apnea and needs to be evaluated for a new mask.   EDEMA -  This is improved with his compression stockings.

## 2015-02-14 ENCOUNTER — Encounter: Payer: Self-pay | Admitting: Cardiology

## 2015-02-20 ENCOUNTER — Encounter: Payer: Self-pay | Admitting: Cardiology

## 2015-02-21 ENCOUNTER — Other Ambulatory Visit: Payer: Self-pay | Admitting: *Deleted

## 2015-02-21 ENCOUNTER — Telehealth: Payer: Self-pay | Admitting: *Deleted

## 2015-02-21 DIAGNOSIS — G4733 Obstructive sleep apnea (adult) (pediatric): Secondary | ICD-10-CM

## 2015-02-21 NOTE — Telephone Encounter (Signed)
Call and spoke with pt as per Dr Arnette Norris pt need to have a split night sleep study done before visits with Dr Claiborne Billings.  Order was placed for a sleep study.Nicholas Gray

## 2015-03-07 ENCOUNTER — Ambulatory Visit (HOSPITAL_BASED_OUTPATIENT_CLINIC_OR_DEPARTMENT_OTHER): Payer: Managed Care, Other (non HMO)

## 2015-03-08 ENCOUNTER — Ambulatory Visit: Payer: Managed Care, Other (non HMO) | Admitting: Cardiovascular Disease

## 2015-03-29 ENCOUNTER — Other Ambulatory Visit: Payer: Self-pay

## 2015-04-03 ENCOUNTER — Other Ambulatory Visit: Payer: Self-pay | Admitting: Cardiology

## 2015-04-03 NOTE — Telephone Encounter (Signed)
REFILL 

## 2015-05-10 ENCOUNTER — Other Ambulatory Visit: Payer: Self-pay | Admitting: Cardiology

## 2015-05-10 NOTE — Telephone Encounter (Signed)
Rx request sent to pharmacy.  

## 2015-05-13 ENCOUNTER — Encounter (HOSPITAL_BASED_OUTPATIENT_CLINIC_OR_DEPARTMENT_OTHER): Payer: Managed Care, Other (non HMO)

## 2015-05-13 ENCOUNTER — Ambulatory Visit (HOSPITAL_BASED_OUTPATIENT_CLINIC_OR_DEPARTMENT_OTHER): Payer: Managed Care, Other (non HMO) | Attending: Cardiology

## 2015-05-13 VITALS — Ht 70.0 in | Wt 250.0 lb

## 2015-05-13 DIAGNOSIS — I493 Ventricular premature depolarization: Secondary | ICD-10-CM | POA: Diagnosis not present

## 2015-05-13 DIAGNOSIS — G4733 Obstructive sleep apnea (adult) (pediatric): Secondary | ICD-10-CM | POA: Insufficient documentation

## 2015-05-13 DIAGNOSIS — R0683 Snoring: Secondary | ICD-10-CM | POA: Diagnosis not present

## 2015-05-13 DIAGNOSIS — Z79899 Other long term (current) drug therapy: Secondary | ICD-10-CM | POA: Diagnosis not present

## 2015-05-27 ENCOUNTER — Encounter: Payer: Self-pay | Admitting: Cardiovascular Disease

## 2015-06-03 NOTE — Sleep Study (Signed)
Patient Name: Nicholas Gray, Wilkinson Date: 05/13/2015 Gender: Male D.O.B: 1960-10-12 Age (years): 54 Referring Provider: Minus Breeding Height (inches): 36 Interpreting Physician: Shelva Majestic MD, ABSM Weight (lbs): 250 RPSGT: Joni Reining BMI: 36 MRN: [id number] Neck Size: 18.00  CLINICAL INFORMATION Sleep Study Type: Split Night CPAP Indication for sleep study: OSA Epworth Sleepiness Score: 6  SLEEP STUDY TECHNIQUE As per the AASM Manual for the Scoring of Sleep and Associated Events v2.3 (April 2016) with a hypopnea requiring 4% desaturations. The channels recorded and monitored were frontal, central and occipital EEG, electrooculogram (EOG), submentalis EMG (chin), nasal and oral airflow, thoracic and abdominal wall motion, anterior tibialis EMG, snore microphone, electrocardiogram, and pulse oximetry. Continuous positive airway pressure (CPAP) was initiated when the patient met split night criteria and was titrated according to treat sleep-disordered breathing.  MEDICATIONS  acetaminophen (TYLENOL) 325 MG tablet 650 mg, Daily PRN     allopurinol (ZYLOPRIM) 100 MG tablet 100 mg, 2 times daily     carvedilol (COREG) 25 MG tablet      colchicine 0.6 MG tablet 0.6 mg, As needed     lisinopril (PRINIVIL,ZESTRIL) 10 MG tablet      naproxen sodium (ANAPROX DS) 550 MG tablet      omeprazole (PRILOSEC) 20 MG capsule 20 mg, Daily     simvastatin (ZOCOR) 40 MG tablet   Medications administered by patient during sleep study : No sleep medicine administered.  RESPIRATORY PARAMETERS Diagnostic Total AHI (/hr): 57.4 RDI (/hr): 60.0 OA Index (/hr): 22.4 CA Index (/hr): 0.4 REM AHI (/hr): 49.4 NREM AHI (/hr): 57.9 Supine AHI (/hr): 78.1 Non-supine AHI (/hr): 39.46 Min O2 Sat (%): 73.00 Mean O2 (%): 90.73 Time below 88% (min): 34.6   Titration Optimal Pressure (cm): 12 AHI at Optimal Pressure (/hr): 1.3 Min O2 at Optimal Pressure (%): 90.0 Supine % at Optimal (%): 51 Sleep % at  Optimal (%): 90    SLEEP ARCHITECTURE The recording time for the entire night was 399.3 minutes. During a baseline period of 227.8 minutes, the patient slept for 139.0 minutes in REM and nonREM, yielding a sleep efficiency of 61.0%. Sleep onset after lights out was 38.7 minutes with a REM latency of 108.0 minutes. The patient spent 9.35% of the night in stage N1 sleep, 84.53% in stage N2 sleep, 0.00% in stage N3 and 6.12% in REM. During the titration period of 168.3 minutes, the patient slept for 152.0 minutes in REM and nonREM, yielding a sleep efficiency of 90.3%. Sleep onset after CPAP initiation was 3.1 minutes with a REM latency of 32.0 minutes. The patient spent 1.64% of the night in stage N1 sleep, 68.09% in stage N2 sleep, 0.00% in stage N3 and 30.26% in REM.  CARDIAC DATA The 2 lead EKG demonstrated sinus rhythm. The mean heart rate was 53.99 beats per minute. Other EKG findings include: PVCs.  LEG MOVEMENT DATA The total Periodic Limb Movements of Sleep (PLMS) were 33. The PLMS index was 6.80.  IMPRESSIONS - Severe obstructive sleep apnea occurred during the diagnostic portion of the study (AHI = 57.4/hour). An optimal PAP pressure was selected for this patient ( 12 cm of water) - No significant central sleep apnea occurred during the diagnostic portion of the study (CAI = 0.4/hour). - Reduced sleep efficiency. - Severe oxygen desaturation to a nadir of 73.00%. - The patient snored with Loud snoring volume during the diagnostic portion of the study. - EKG findings include PVCs. - Mild periodic limb movements of sleep occurred  during the study.  DIAGNOSIS - Obstructive Sleep Apnea (327.23 [G47.33 ICD-10])  RECOMMENDATIONS - Recommend an initial CPAP pressure with EPR/C-Flex of 3 at 12 cm H2O with  heated humidification.  A medium size [employer]Paykel Full Face Mask Simplus mask was used for the CPAP titration. - Avoid alcohol, sedatives and other CNS depressants that may worsen  sleep apnea and disrupt normal sleep architecture. - With a positional component to the severe sleep apnea efforts should be made to avoid supine sleep.  - Sleep hygiene should be reviewed to assess factors that may improve sleep quality. - Weight management and regular exercise should be initiated or continued. - Recommend download be obtained in 4 weeks and f/u sleep clinic evaluation.   Troy Sine, MD, [place], American Board of Sleep Medicine  ELECTRONICALLY SIGNED ON:  06/03/2015, 11:09 AM [place] PH: [phone]   FX: [phone] [place]

## 2015-06-04 ENCOUNTER — Encounter: Payer: Self-pay | Admitting: Cardiology

## 2015-06-07 NOTE — Progress Notes (Signed)
Patient notified of results and recommendations. Referral made to [facility] in [place].

## 2015-06-11 ENCOUNTER — Telehealth: Payer: Self-pay | Admitting: Cardiology

## 2015-06-11 ENCOUNTER — Encounter: Payer: Self-pay | Admitting: Cardiovascular Disease

## 2015-06-11 NOTE — Telephone Encounter (Signed)
Follow up       Dr Claiborne Billings ordered a CPAP machine thru [facility]  They are out of network for his cpap service.  Please send cpap order to another home health.  He has [employer]

## 2015-06-11 NOTE — Telephone Encounter (Signed)
This is a [facility] pt and Dr Claiborne Billings ordered his sleep study and treatment.

## 2015-06-11 NOTE — Telephone Encounter (Signed)
New message   [facility]  Is calling to state that they are out of new work for [employer] for CPAP machine

## 2015-06-13 ENCOUNTER — Telehealth: Payer: Self-pay | Admitting: Cardiovascular Disease

## 2015-06-13 NOTE — Telephone Encounter (Signed)
New Message  Pt calling to give fax # in order for his Rx for CPAP and his sleep study results to be sent- [phone]. Please advise

## 2015-06-19 NOTE — Telephone Encounter (Signed)
Referral re sent to [facility].

## 2015-08-20 ENCOUNTER — Encounter: Payer: Self-pay | Admitting: Cardiovascular Disease

## 2015-12-05 ENCOUNTER — Encounter: Payer: Self-pay | Admitting: Cardiology

## 2015-12-19 NOTE — Progress Notes (Signed)
HPI The patient presents for followup of his cardiomyopathy. Since I last saw him he has had a follow up sleep study and CPAP ordered.  He has been wearing this and does think helped. He also was diagnosed with [facility] spotted fever. He's had some exhaustion he thinks related to this. He does have some muscle aches between the shoulder blades occasionally. He said some tingling in his pinky and he thought this was all related to the fever.  He did have some discomfort not long ago when he was lifting his arms above his head hyperextended. He became very exhausted. However, he doesn't have chest discomfort, neck or arm discomfort. He doesn't have palpitations, presyncope or syncope. He has had one episode of shortness of breath and a sudden weight gain. He said this went on for a few days and then all of a sudden he started urinating a lot and got rid of his fluid.bution.    No Known Allergies  Current Outpatient Prescriptions  Medication Sig Dispense Refill  . acetaminophen (TYLENOL) 325 MG tablet Take 650 mg by mouth daily as needed for pain.    Marland Kitchen allopurinol (ZYLOPRIM) 100 MG tablet Take 100 mg by mouth 2 (two) times daily.     . carvedilol (COREG) 25 MG tablet TAKE (1) TABLET BY MOUTH TWICE DAILY WITH A MEAL. 60 tablet 10  . colchicine 0.6 MG tablet Take 0.6 mg by mouth as needed.     Marland Kitchen lisinopril (PRINIVIL,ZESTRIL) 10 MG tablet TAKE 2 TABLETS BY MOUTH TWICE DAILY. 120 tablet 10  . naproxen sodium (ANAPROX DS) 550 MG tablet Take 1 tablet twice daily for gout pain. Best taken with a meal. 30 tablet 0  . omeprazole (PRILOSEC) 20 MG capsule Take 20 mg by mouth daily.    . simvastatin (ZOCOR) 40 MG tablet Take 40 mg by mouth at bedtime.    . furosemide (LASIX) 20 MG tablet Take 1 tablet (20 mg total) by mouth daily. 90 tablet 3   No current facility-administered medications for this visit.     Past Medical History:  Diagnosis Date  . Cardiomyopathy    EF 30-40% (Normal coronaries cath  2010).  Echo 2015 EF 40%  . Colonic polyp   . Diverticulosis of colon   . GERD (gastroesophageal reflux disease)   . History of chicken pox   . HLD (hyperlipidemia)    recent  . HTN (hypertension)    recent  . Sleep apnea    does not use CPAP.      Past Surgical History:  Procedure Laterality Date  . TONSILLECTOMY AND ADENOIDECTOMY      ROS: As stated in the HPI and negative for all other systems.  PHYSICAL EXAM BP (!) 106/56 (BP Location: Left Arm, Patient Position: Sitting, Cuff Size: Normal)   Pulse 72   Ht 5\' 10"  (1.778 m)   Wt 255 lb 4 oz (115.8 kg)   BMI 36.62 kg/m  GENERAL:  Well appearing NECK:  No jugular venous distention, waveform within normal limits, carotid upstroke brisk and symmetric, no bruits, no thyromegaly LUNGS:  Clear to auscultation bilaterally BACK:  No CVA tenderness CHEST:  Unremarkable HEART:  PMI not displaced, question mild RV lift, or sustained,S1 and S2 within normal limits, no S3, no S4, no clicks, no rubs, no murmurs ABD:  Flat, positive bowel sounds normal in frequency in pitch, no bruits, no rebound, no guarding, no midline pulsatile mass, no hepatomegaly, no splenomegaly, obese EXT:  2  plus pulses throughout, no edema, no cyanosis no clubbing  EKG:  Sinus rhythm, rate 72,  LBBB  12/20/2015  ASSESSMENT AND PLAN  CARDIOMYOPATHY -  He has had some episodes of shortness of breath and weight gain episodically so I will repeat an echocardiogram. He's going to get Lasix 20 mg daily when necessary we talked about this and salt restriction.  HYPERTENSION -  The blood pressure is at target. No change in medications is indicated. We will continue with therapeutic lifestyle changes (TLC).  OBESITY, UNSPECIFIED -  We had this discussion again.    SLEEP APNEA -  He continues to wear the CPAP.   I encouraged weight loss.  EDEMA - This is improved.

## 2015-12-20 ENCOUNTER — Encounter: Payer: Self-pay | Admitting: Cardiology

## 2015-12-20 ENCOUNTER — Ambulatory Visit (INDEPENDENT_AMBULATORY_CARE_PROVIDER_SITE_OTHER): Payer: Managed Care, Other (non HMO) | Admitting: Cardiology

## 2015-12-20 VITALS — BP 106/56 | HR 72 | Ht 70.0 in | Wt 255.2 lb

## 2015-12-20 DIAGNOSIS — I429 Cardiomyopathy, unspecified: Secondary | ICD-10-CM | POA: Diagnosis not present

## 2015-12-20 DIAGNOSIS — I447 Left bundle-branch block, unspecified: Secondary | ICD-10-CM | POA: Diagnosis not present

## 2015-12-20 DIAGNOSIS — I10 Essential (primary) hypertension: Secondary | ICD-10-CM

## 2015-12-20 DIAGNOSIS — I420 Dilated cardiomyopathy: Secondary | ICD-10-CM | POA: Diagnosis not present

## 2015-12-20 MED ORDER — FUROSEMIDE 20 MG PO TABS: 20.0000 mg | ORAL_TABLET | Freq: Every day | ORAL | 3 refills | Status: DC

## 2015-12-20 NOTE — Patient Instructions (Signed)
Medication Instructions:  START Lasix 20mg  Take 1 tab by mouth once a day  Labwork: None Ordered  Testing/Procedures: Your physician has requested that you have an echocardiogram. Echocardiography is a painless test that uses sound waves to create images of your heart. It provides your doctor with information about the size and shape of your heart and how well your heart's chambers and valves are working. This procedure takes approximately one hour. There are no restrictions for this procedure.   Follow-Up: Your physician wants you to follow-up in: 1 YEAR with  DR [facility]. You will receive a reminder letter in the mail two months in advance. If you don't receive a letter, please call our office to schedule the follow-up appointment.   Any Other Special Instructions Will Be Listed Below (If Applicable).     If you need a refill on your cardiac medications before your next appointment, please call your pharmacy.

## 2016-01-04 ENCOUNTER — Ambulatory Visit (HOSPITAL_COMMUNITY): Payer: Managed Care, Other (non HMO) | Attending: Cardiology

## 2016-01-04 ENCOUNTER — Other Ambulatory Visit: Payer: Self-pay

## 2016-01-04 DIAGNOSIS — I429 Cardiomyopathy, unspecified: Secondary | ICD-10-CM | POA: Insufficient documentation

## 2016-01-04 DIAGNOSIS — I272 Other secondary pulmonary hypertension: Secondary | ICD-10-CM | POA: Diagnosis not present

## 2016-01-04 DIAGNOSIS — I517 Cardiomegaly: Secondary | ICD-10-CM | POA: Diagnosis not present

## 2016-01-04 DIAGNOSIS — I501 Left ventricular failure: Secondary | ICD-10-CM | POA: Diagnosis not present

## 2016-03-10 ENCOUNTER — Other Ambulatory Visit: Payer: Self-pay | Admitting: Cardiology

## 2016-03-10 NOTE — Telephone Encounter (Signed)
Rx(s) sent to pharmacy electronically.  

## 2016-06-11 ENCOUNTER — Telehealth: Payer: Self-pay | Admitting: Cardiology

## 2016-06-11 NOTE — Telephone Encounter (Signed)
Returned call to patient. He states he had his sleep study over a year ago and has used CPAP for this long. He uses [facility] for DME supplies. He states he has never met Dr. Claiborne Billings and he would like an appointment.   Message routed to [place], [place] to assist in arranging sleep clinic appt with Dr. Claiborne Billings

## 2016-06-11 NOTE — Telephone Encounter (Signed)
New message  Pt call requesting to speak with RN about getting an appt with Dr Claiborne Billings for sleepy clinic. Please call back to discuss

## 2016-06-18 NOTE — Telephone Encounter (Signed)
Nicholas Gray can you please take care of this? Thanks.

## 2016-07-29 ENCOUNTER — Ambulatory Visit (INDEPENDENT_AMBULATORY_CARE_PROVIDER_SITE_OTHER): Payer: Managed Care, Other (non HMO) | Admitting: Cardiovascular Disease

## 2016-07-29 ENCOUNTER — Encounter: Payer: Self-pay | Admitting: Cardiovascular Disease

## 2016-07-29 VITALS — BP 142/106 | HR 57 | Ht 70.0 in | Wt 273.0 lb

## 2016-07-29 DIAGNOSIS — G4733 Obstructive sleep apnea (adult) (pediatric): Secondary | ICD-10-CM | POA: Diagnosis not present

## 2016-07-29 DIAGNOSIS — Z6839 Body mass index (BMI) 39.0-39.9, adult: Secondary | ICD-10-CM | POA: Diagnosis not present

## 2016-07-29 DIAGNOSIS — I447 Left bundle-branch block, unspecified: Secondary | ICD-10-CM | POA: Diagnosis not present

## 2016-07-29 DIAGNOSIS — R0609 Other forms of dyspnea: Secondary | ICD-10-CM

## 2016-07-29 DIAGNOSIS — I10 Essential (primary) hypertension: Secondary | ICD-10-CM

## 2016-07-29 DIAGNOSIS — I428 Other cardiomyopathies: Secondary | ICD-10-CM

## 2016-07-29 DIAGNOSIS — Z9989 Dependence on other enabling machines and devices: Secondary | ICD-10-CM

## 2016-07-29 NOTE — Patient Instructions (Signed)
Your physician recommends that you schedule a follow-up appointment in: 1 month with Dr Percival Spanish.   Your physician wants you to follow-up in: 1 year or sooner if needed with Dr Claiborne Billings for sleep. You will receive a reminder letter in the mail two months in advance. If you don't receive a letter, please call our office to schedule the follow-up appointment.

## 2016-08-03 NOTE — Progress Notes (Addendum)
Cardiology Office Note    Date:  08/03/2016   ID:  Nicholas Gray, DOB [birth year], MRN [id number]  PCP:  Rocky Morel, MD  Cardiologist:  Shelva Majestic, MD (sleep); Dr. Percival Spanish  Chief Complaint  Patient presents with  . Follow-up  . Edema    Ankles after standing all day.   [facility] evaluation.   History of Present Illness:  Nicholas Gray is a 56 y.o. male who is followed by Dr. Percival Spanish for his cardiology care.  He has a history of a nonischemic cardiomyopathy with an ejection fraction of 30-40% in 2010, and his most recent echo in September 2017 showed an EF of 35-40% with grade 1 diastolic dysfunction and moderate pulmonary hypertension.  He admits to recent increasing shortness of breath with uphill walking and notes mild ankle swelling.  He was initially referred for a sleep study in 2010 at [facility].  He had seen Dr. Dagmar Hait  on one occasion in February 2011.  He initially tried therapy, but abandoned this shortly thereafter, and never had follow-up evaluation.   He was ultimately referred back for a split night sleep study on 05/13/2015 due to symptoms of snoring, witnessed apnea, and nonrestorative sleep.  This demonstrated severe sleep apnea with an AHI of 57.4 per hour.  AHI during REM sleep was 49.4/h and with supine sleep was 78.1/h.  He had significant oxygen desaturation to 73% and time below 88% was 34.6 minutes.  CPAP titration was initiated.  During a split-night study, and a 12 cm water pressure was recommended.  [facility] is his DME.  For some reason, after initiating CPAP therapy one year ago, he never presented to the sleep clinic for initial assessment.  However, since initiating CPAP therapy, he is sleeping better.  He is unaware of any breakthrough snoring.  In the office today a download was obtained from 06/29/2016 through 07/28/2016.  He is meeting compliance standards with 100% usage days and usage greater than 4 hours.   He is averaging 7 hours and 43 minutes of CPAP use per night.  AHI at 12.  Senna meter water pressure is 4.4.  He has an air-fluid F10, size small mask.  Further review of his download indicates that there were at least 3 nights where his AHI was >5/h with a peak up to 8.8.  Of note, he admits to a 30 pound weight gain over the past 6 months.  He is unaware of any breakthrough snoring.  His sleep is more restorative.  He is unaware of any parasomnias, or nocturnal palpitations. He denies daytime sleepiness, hypersomnolence, bruxism, restless legs, hypnogognic hallucinations, no cataplexy.  An Epworth Sleepiness Scale was recalculated in the office today which arteries against residual daytime sleepiness as shown below.  Epworth Sleepiness Scale: Situation   Chance of Dozing/Sleeping (0 = never , 1 = slight chance , 2 = moderate chance , 3 = high chance )   sitting and reading 2   watching TV 1   sitting inactive in a public place 0   being a passenger in a motor vehicle for an hour or more 0   lying down in the afternoon 3   sitting and talking to someone 0   sitting quietly after lunch (no alcohol) 1   while stopped for a few minutes in traffic as the driver 0   Total Score  7    Past Medical History:  Diagnosis Date  . Cardiomyopathy  EF 30-40% (Normal coronaries cath 2010).  Echo 2015 EF 40%  . Colonic polyp   . Diverticulosis of colon   . GERD (gastroesophageal reflux disease)   . History of chicken pox   . HLD (hyperlipidemia)    recent  . HTN (hypertension)    recent  . Sleep apnea    does not use CPAP.      Past Surgical History:  Procedure Laterality Date  . TONSILLECTOMY AND ADENOIDECTOMY      Current Medications: Outpatient Medications Prior to Visit  Medication Sig Dispense Refill  . allopurinol (ZYLOPRIM) 100 MG tablet Take 100 mg by mouth 2 (two) times daily.     . carvedilol (COREG) 25 MG tablet TAKE (1) TABLET BY MOUTH TWICE DAILY WITH A MEAL. 60 tablet 7  .  lisinopril (PRINIVIL,ZESTRIL) 10 MG tablet TAKE 2 TABLETS BY MOUTH TWICE DAILY. 120 tablet 7  . omeprazole (PRILOSEC) 20 MG capsule Take 20 mg by mouth daily.    . simvastatin (ZOCOR) 40 MG tablet Take 40 mg by mouth at bedtime.    . furosemide (LASIX) 20 MG tablet Take 1 tablet (20 mg total) by mouth daily. (Patient taking differently: Take 20 mg by mouth as needed. ) 90 tablet 3  . acetaminophen (TYLENOL) 325 MG tablet Take 650 mg by mouth daily as needed for pain.    Marland Kitchen colchicine 0.6 MG tablet Take 0.6 mg by mouth as needed.     . naproxen sodium (ANAPROX DS) 550 MG tablet Take 1 tablet twice daily for gout pain. Best taken with a meal. 30 tablet 0   No facility-administered medications prior to visit.      Allergies:   Patient has no known allergies.   Social History   Social History  . Marital status: Married    Spouse name: N/A  . Number of children: N/A  . Years of education: N/A   Social History Main Topics  . Smoking status: Former Smoker    Packs/day: 1.50    Years: 3.00    Quit date: 04/07/1980  . Smokeless tobacco: Never Used  . Alcohol use No  . Drug use: No     Comment: quit in 1999, marijuana  . Sexual activity: Not Asked   Other Topics Concern  . None   Social History Narrative  . None     Family History:  The patient's family history is notable for diabetes, hyperlipidemia, lung cancer, stroke, and arthritis.  ROS General: Negative; No fevers, chills, or night sweats;  HEENT: Negative; No changes in vision or hearing, sinus congestion, difficulty swallowing Pulmonary: Negative; No cough, wheezing, shortness of breath, hemoptysis Cardiovascular: History of nonischemic heart cardiomyopathy, hypertension; exertional dyspnea, occasional swelling, hyperlipidemia GI: Positive for GERD GU: Negative; No dysuria, hematuria, or difficulty voiding Musculoskeletal: Negative; no myalgias, joint pain, or weakness Hematologic/Oncology: Negative; no easy bruising,  bleeding Endocrine: Negative; no heat/cold intolerance; no diabetes Neuro: Negative; no changes in balance, headaches Skin: Negative; No rashes or skin lesions Psychiatric: Negative; No behavioral problems, depression Sleep:  See HPI Other comprehensive 14 point system review is negative.   PHYSICAL EXAM:   VS:  BP (!) 142/106   Pulse (!) 57   Ht 5' 10"  (1.778 m)   Wt 273 lb (123.8 kg)   BMI 39.17 kg/m     Repeat blood pressure by me 145/96  Wt Readings from Last 3 Encounters:  07/29/16 273 lb (123.8 kg)  12/20/15 255 lb 4 oz (115.8 kg)  05/13/15 250 lb (113.4 kg)    General: Alert, oriented, no distress.  Skin: normal turgor, no rashes, warm and dry HEENT: Normocephalic, atraumatic. Pupils equal round and reactive to light; sclera anicteric; extraocular muscles intact; Fundi  Without hemorrhages or exudates. Nose without nasal septal hypertrophy Mouth/Parynx benign; Mallinpatti scale 3 Neck: No JVD, no carotid bruits; normal carotid upstroke Lungs: clear to ausculatation and percussion; no wheezing or rales Chest wall: without tenderness to palpitation Heart: PMI not displaced, RRR, s1 s2 normal, 1/6 systolic murmur, no diastolic murmur, no rubs, gallops, thrills, or heaves Abdomen: soft, nontender; no hepatosplenomehaly, BS+; abdominal aorta nontender and not dilated by palpation. Back: no CVA tenderness Pulses 2+ Musculoskeletal: full range of motion, normal strength, no joint deformities Extremities: no clubbing cyanosis or edema, Homan's sign negative  Neurologic: grossly nonfocal; Cranial nerves grossly wnl Psychologic: Normal mood and affect   Studies/Labs Reviewed:   EKG:  EKG is ordered today.  ECG (independently read by me): Sinus bradycardia 57 bpm.  First-degree AV block with PR interval at 200 ms.  Left bundle branch block.  QTc interval 463 ms.  Recent Labs: BMP Latest Ref Rng & Units 01/08/2012 02/20/2011 11/13/2010  Glucose 70 - 99 mg/dL 98 99 106(H)  BUN  6 - 23 mg/dL 21 19 22   Creatinine 0.4 - 1.5 mg/dL 1.3 1.2 1.3  Sodium 135 - 145 mEq/L 134(L) 136 139  Potassium 3.5 - 5.1 mEq/L 4.9 4.2 5.0  Chloride 96 - 112 mEq/L 100 101 102  CO2 19 - 32 mEq/L 26 26 29   Calcium 8.4 - 10.5 mg/dL 9.1 9.1 9.2     Hepatic Function Latest Ref Rng & Units 01/08/2012 11/13/2010 01/30/2010  Total Protein 6.0 - 8.3 g/dL 7.1 7.2 7.3  Albumin 3.5 - 5.2 g/dL 4.3 4.4 4.3  AST 0 - 37 U/L 19 16 23   ALT 0 - 53 U/L 18 18 25   Alk Phosphatase 39 - 117 U/L 52 42 42  Total Bilirubin 0.3 - 1.2 mg/dL 0.5 0.7 0.8  Bilirubin, Direct 0.0 - 0.3 mg/dL 0.1 0.1 0.1    CBC Latest Ref Rng & Units 01/08/2012 11/13/2010 10/29/2009  WBC 4.5 - 10.5 K/uL 6.0 6.8 6.6  Hemoglobin 13.0 - 17.0 g/dL 12.7(L) 12.8(L) 12.9(L)  Hematocrit 39.0 - 52.0 % 37.3(L) 37.5(L) 36.7(L)  Platelets 150.0 - 400.0 K/uL 200.0 205.0 221.0   Lab Results  Component Value Date   MCV 90.9 01/08/2012   MCV 91.1 11/13/2010   MCV 92.0 10/29/2009   Lab Results  Component Value Date   TSH 1.47 10/29/2009   No results found for: HGBA1C   BNP No results found for: BNP  ProBNP    Component Value Date/Time   PROBNP <30.0 03/12/2009 1249     Lipid Panel     Component Value Date/Time   CHOL 116 01/08/2012 0804   TRIG 191.0 (H) 01/08/2012 0804   HDL 25.70 (L) 01/08/2012 0804   CHOLHDL 5 01/08/2012 0804   VLDL 38.2 01/08/2012 0804   LDLCALC 52 01/08/2012 0804   LDLDIRECT 161.2 10/19/2008 0946     RADIOLOGY: No results found.   Additional studies/ records that were reviewed today include:  I review the office records of Dr. Charlesetta Shanks, the patient's sleep study with CPAP titration, and have obtain a download from March 25 through 07/28/2016.    ASSESSMENT:    1. OSA on CPAP   2. Nonischemic cardiomyopathy ([facility])   3. Essential hypertension   4. LBBB (left bundle  branch block)   5. Exertional dyspnea   6. Class 2 severe obesity due to excess calories with serious comorbidity and body mass  index (BMI) of 39.0 to 39.9 in adult [facility])      PLAN:  Mr. Amori Colomb is a 56 year old male who has a history of a nonischemic cardiomyopathy with an ejection fraction initially at 30-35%, and most recently 35-40%.  In February 2017 a sleep study demonstrated severe sleep apnea with an AHI 57.4 per hour and significant oxygen desaturation to a nadir of 73%.  He had mild periodic lid movements during that study.  He has not been symptomatic with restless legs.  Since his follow-up sleep study, he has been on CPAP therapy for over a year.  Of note, he has gained 30 pounds according to his report, and objectively since February 2017.  His weight is increased from 250 pounds to 273 as noted today.  BMI is 39.2 and he is closely approaching morbid obesity.  He feels improved since initiating CPAP therapy and is meeting compliance standards with 100% use.  His sleep is more restorative, his nocturia has decreased, he is unaware of breakthrough snoring.  At 12 cm pressure his AHI is 4.4, however, there are several days where his AHI exceeded 5 per hour.  In the office today I have increased his CPAP pressure to 13 cm to see if this can overcome the several days with increased AHI.  A repeat download will be obtained in one month and further adjustment will be made if necessary.  We discussed the importance of weight loss and the implications associated with obstructive sleep apnea on his cardiovascular health.  He has experienced shortness of breath with a uphill walking and his blood pressure today is elevated.  I discussed with him the new hypertensive guidelines.  He currently is taking carvedilol 25 mg twice a day, lisinopril 40 mg daily in a 20 twice a day regimen and when necessary Lasix.  He sees Dr. Percival Spanish for his cardiology care.  With his elevated BP and reduced LV function and class II symptoms, he may be a candidate to switch from lisinopril to entresto  in the future and also potentially may  benefit from the addition of spironolactone.  I discussed this with the patient but I will defer this to Dr. Percival Spanish when he sees him in the office and have recommended a follow-up appointment to be seen sooner in light of his blood pressure elevation.    Medication Adjustments/Labs and Tests Ordered: Current medicines are reviewed at length with the patient today.  Concerns regarding medicines are outlined above.  Medication changes, Labs and Tests ordered today are listed in the Patient Instructions below. Patient Instructions  Your physician recommends that you schedule a follow-up appointment in: 1 month with Dr Percival Spanish.   Your physician wants you to follow-up in: 1 year or sooner if needed with Dr Claiborne Billings for sleep. You will receive a reminder letter in the mail two months in advance. If you don't receive a letter, please call our office to schedule the follow-up appointment.    Time spent 40 minutes Signed, Shelva Majestic, MD  08/03/2016 11:17 AM    [facility] [address], [place], [place], [place]  [address] Phone: [phone]

## 2016-09-03 NOTE — Progress Notes (Signed)
HPI The patient presents for followup of his cardiomyopathy. Since I last saw him he has seen Dr. Claiborne Billings for follow up of his sleep apnea.  There was a suggestion that he should be considered for change in meds.  No [name] time of that visit he was hypertensive which is quite unusual. I been following Nicholas Gray for quite a while and he's had low blood pressures predominantly. He's had a nonischemic cardiomyopathy that's been stable with an ejection fraction of about 40% for several years. He actually doesn't have any symptoms overtly related to this. He denies any PND or orthopnea. He has no palpitations, presyncope or syncope. He has no chest pressure, neck or arm discomfort. He's had some subtle weight gain but no edema. He doesn't stay active very much although the [place] months. He recently had [name] spotted fever again.  No Known Allergies  Current Outpatient Prescriptions  Medication Sig Dispense Refill  . allopurinol (ZYLOPRIM) 100 MG tablet Take 100 mg by mouth 2 (two) times daily.     . carvedilol (COREG) 25 MG tablet TAKE (1) TABLET BY MOUTH TWICE DAILY WITH A MEAL. 60 tablet 7  . lisinopril (PRINIVIL,ZESTRIL) 10 MG tablet TAKE 2 TABLETS BY MOUTH TWICE DAILY. 120 tablet 7  . ranitidine (ZANTAC) 150 MG tablet Take 150 mg by mouth daily.    . simvastatin (ZOCOR) 40 MG tablet Take 40 mg by mouth at bedtime.    . furosemide (LASIX) 20 MG tablet Take 1 tablet (20 mg total) by mouth daily. (Patient taking differently: Take 20 mg by mouth as needed. ) 90 tablet 3   No current facility-administered medications for this visit.     Past Medical History:  Diagnosis Date  . Cardiomyopathy    EF 30-40% (Normal coronaries cath 2010).  Echo 2015 EF 40%  . Colonic polyp   . Diverticulosis of colon   . GERD (gastroesophageal reflux disease)   . History of chicken pox   . HLD (hyperlipidemia)    recent  . HTN (hypertension)    recent  . Sleep apnea    does not use CPAP.      Past  Surgical History:  Procedure Laterality Date  . TONSILLECTOMY AND ADENOIDECTOMY      ROS: Fatigue. Otherwise as stated in the HPI and negative for all other systems.   PHYSICAL EXAM BP 102/60   Pulse (!) 58   Ht 5\' 10"  (1.778 m)   Wt 265 lb (120.2 kg)   BMI 38.02 kg/m   GENERAL:  Well appearing HEENT:  Pupils equal round and reactive, fundi not visualized, oral mucosa unremarkable NECK:  No jugular venous distention, waveform within normal limits, carotid upstroke brisk and symmetric, no bruits, no thyromegaly LYMPHATICS:  No cervical, inguinal adenopathy LUNGS:  Clear to auscultation bilaterally BACK:  No CVA tenderness CHEST:  Unremarkable HEART:  PMI not displaced or sustained,S1 and S2 within normal limits, no S3, no S4, no clicks, no rubs, no murmurs ABD:  Flat, positive bowel sounds normal in frequency in pitch, no bruits, no rebound, no guarding, no midline pulsatile mass, no hepatomegaly, no splenomegaly EXT:  2 plus pulses throughout, no edema, no cyanosis no clubbing SKIN:  No rashes no nodules NEURO:  Cranial nerves II through XII grossly intact, motor grossly intact throughout PSYCH:  Cognitively intact, oriented to person place and time    ASSESSMENT AND PLAN  CARDIOMYOPATHY -  His EF is 40%.  This was on echocardiogram in 2017.  He has no symptoms.  Over the years his blood pressure has not allowed med titration so I'm not considering spironolactone or Entresto at this point.  He will keep an eye on his BP and if his BP creeps up we could consider this.   HYPERTENSION -  The blood pressure is low as above.    OBESITY, UNSPECIFIED -  We discussed this again.      SLEEP APNEA -  He has followed with Dr. Claiborne Billings.    EDEMA - This is very mild.  No change in therapy.

## 2016-09-05 ENCOUNTER — Encounter: Payer: Self-pay | Admitting: Cardiology

## 2016-09-05 ENCOUNTER — Ambulatory Visit (INDEPENDENT_AMBULATORY_CARE_PROVIDER_SITE_OTHER): Payer: Managed Care, Other (non HMO) | Admitting: Cardiology

## 2016-09-05 VITALS — BP 102/60 | HR 58 | Ht 70.0 in | Wt 265.0 lb

## 2016-09-05 DIAGNOSIS — I420 Dilated cardiomyopathy: Secondary | ICD-10-CM | POA: Diagnosis not present

## 2016-09-05 NOTE — Patient Instructions (Signed)
Medication Instructions:  Continue current medications  Labwork: None Ordered  Testing/Procedures: None Ordered  Follow-Up: Your physician recommends that you schedule a follow-up appointment in: Early November   Any Other Special Instructions Will Be Listed Below (If Applicable).   If you need a refill on your cardiac medications before your next appointment, please call your pharmacy.

## 2016-11-06 ENCOUNTER — Other Ambulatory Visit: Payer: Self-pay | Admitting: Cardiology

## 2016-12-31 ENCOUNTER — Encounter: Payer: Self-pay | Admitting: Cardiology

## 2017-01-08 NOTE — Progress Notes (Signed)
     HPI The patient presents for followup of his cardiomyopathy. He has seen Dr. Claiborne Billings for follow up of his sleep apnea.  There was a suggestion that he should be considered for change in meds.  He's had a nonischemic cardiomyopathy that's been stable with an ejection fraction of about 40% for several years.   I last saw him he has done OK.  He lost his job yesterday.  The patient denies any new symptoms such as chest discomfort, neck or arm discomfort. There has been no new shortness of breath, PND or orthopnea. There have been no reported palpitations, presyncope or syncope.   No Known Allergies  Current Outpatient Prescriptions  Medication Sig Dispense Refill  . allopurinol (ZYLOPRIM) 100 MG tablet Take 100 mg by mouth 2 (two) times daily.     . carvedilol (COREG) 25 MG tablet TAKE (1) TABLET BY MOUTH TWICE DAILY WITH A MEAL. 60 tablet 0  . furosemide (LASIX) 20 MG tablet Take 20 mg by mouth daily.    Marland Kitchen lisinopril (PRINIVIL,ZESTRIL) 10 MG tablet TAKE 2 TABLETS BY MOUTH TWICE DAILY. 120 tablet 0  . ranitidine (ZANTAC) 150 MG tablet Take 150 mg by mouth daily.    . simvastatin (ZOCOR) 40 MG tablet Take 40 mg by mouth at bedtime.     No current facility-administered medications for this visit.     Past Medical History:  Diagnosis Date  . Cardiomyopathy    EF 30-40% (Normal coronaries cath 2010).  Echo 2015 EF 40%  . Colonic polyp   . Diverticulosis of colon   . GERD (gastroesophageal reflux disease)   . History of chicken pox   . HLD (hyperlipidemia)    recent  . HTN (hypertension)    recent  . Sleep apnea    does not use CPAP.      Past Surgical History:  Procedure Laterality Date  . TONSILLECTOMY AND ADENOIDECTOMY      ROS:As stated in the HPI and negative for all other systems.  PHYSICAL EXAM BP 124/72   Pulse 63   Ht 5\' 10"  (1.778 m)   Wt 270 lb 6.4 oz (122.7 kg)   SpO2 97%   BMI 38.80 kg/m   GENERAL:  Well appearing NECK:  No jugular venous distention,  waveform within normal limits, carotid upstroke brisk and symmetric, no bruits, no thyromegaly LUNGS:  Clear to auscultation bilaterally CHEST:  Unremarkable HEART:  PMI not displaced or sustained,S1 and S2 within normal limits, no S3, no S4, no clicks, no rubs, no murmurs ABD:  Flat, positive bowel sounds normal in frequency in pitch, no bruits, no rebound, no guarding, no midline pulsatile mass, no hepatomegaly, no splenomegaly EXT:  2 plus pulses throughout, no edema, no cyanosis no clubbing   EKG:  NA   ASSESSMENT AND PLAN  CARDIOMYOPATHY -  His EF is 40%.    This was on echocardiogram in 2017.  He has no new symptoms.  No change in therapy is planned.  His BP will not allow med titration.  He will not be able to afford Entresto.  HYPERTENSION -  The blood pressure is at target. No change in medications is indicated. We will continue with therapeutic lifestyle changes (TLC).  OBESITY, UNSPECIFIED -  We have discussed this many times over the years.   SLEEP APNEA -  He is having this followed by Dr. Claiborne Billings.   EDEMA - This is very mild.  No change in therapy.

## 2017-01-09 ENCOUNTER — Ambulatory Visit (INDEPENDENT_AMBULATORY_CARE_PROVIDER_SITE_OTHER): Payer: Managed Care, Other (non HMO) | Admitting: Cardiology

## 2017-01-09 ENCOUNTER — Other Ambulatory Visit: Payer: Self-pay | Admitting: Cardiology

## 2017-01-09 ENCOUNTER — Encounter: Payer: Self-pay | Admitting: Cardiology

## 2017-01-09 VITALS — BP 124/72 | HR 63 | Ht 70.0 in | Wt 270.4 lb

## 2017-01-09 DIAGNOSIS — I420 Dilated cardiomyopathy: Secondary | ICD-10-CM | POA: Diagnosis not present

## 2017-01-09 DIAGNOSIS — I10 Essential (primary) hypertension: Secondary | ICD-10-CM

## 2017-01-09 DIAGNOSIS — I447 Left bundle-branch block, unspecified: Secondary | ICD-10-CM

## 2017-01-09 NOTE — Patient Instructions (Signed)

## 2017-01-09 NOTE — Telephone Encounter (Signed)
Rx(s) sent to pharmacy electronically.  

## 2017-01-10 ENCOUNTER — Encounter: Payer: Self-pay | Admitting: Cardiology

## 2017-05-29 DIAGNOSIS — G4733 Obstructive sleep apnea (adult) (pediatric): Secondary | ICD-10-CM | POA: Diagnosis not present

## 2017-06-12 DIAGNOSIS — Z20818 Contact with and (suspected) exposure to other bacterial communicable diseases: Secondary | ICD-10-CM | POA: Diagnosis not present

## 2017-06-12 DIAGNOSIS — J029 Acute pharyngitis, unspecified: Secondary | ICD-10-CM | POA: Diagnosis not present

## 2017-08-04 ENCOUNTER — Ambulatory Visit: Payer: 59 | Admitting: Cardiovascular Disease

## 2017-08-04 ENCOUNTER — Encounter: Payer: Self-pay | Admitting: Cardiovascular Disease

## 2017-08-04 VITALS — BP 122/76 | HR 55 | Ht 70.0 in | Wt 276.4 lb

## 2017-08-04 DIAGNOSIS — I10 Essential (primary) hypertension: Secondary | ICD-10-CM

## 2017-08-04 DIAGNOSIS — Z6839 Body mass index (BMI) 39.0-39.9, adult: Secondary | ICD-10-CM | POA: Diagnosis not present

## 2017-08-04 DIAGNOSIS — I420 Dilated cardiomyopathy: Secondary | ICD-10-CM

## 2017-08-04 DIAGNOSIS — G4733 Obstructive sleep apnea (adult) (pediatric): Secondary | ICD-10-CM

## 2017-08-04 DIAGNOSIS — E66812 Obesity, class 2: Secondary | ICD-10-CM

## 2017-08-04 NOTE — Progress Notes (Signed)
Cardiology Office Note    Date:  08/11/2017   ID:  DEMARCUS THIELKE, DOB [birth year], MRN [id number]  PCP:  [facility]  Cardiologist:  Shelva Majestic, MD (sleep); Dr. Percival Spanish  No chief complaint on file.  F/U Sleep Clinic evaluation.   History of Present Illness:  Nicholas Gray is a 57 y.o. male who is followed by Dr. Percival Spanish for his cardiology care.  He presents to sleep clinic for a yearly follow-up evaluation on CPAP therapy.  Nicholas Gray has a history of a nonischemic cardiomyopathy with an ejection fraction of 30-40% in 2010, and his most recent echo in September 2017 showed an EF of 35-40% with grade 1 diastolic dysfunction and moderate pulmonary hypertension.  He admits to recent increasing shortness of breath with uphill walking and notes mild ankle swelling.  He was initially referred for a sleep study in 2010 at [facility].  He had seen Dr. Dagmar Hait  on one occasion in February 2011.  He initially tried therapy, but abandoned this shortly thereafter, and never had follow-up evaluation.   He was ultimately referred back for a split night sleep study on 05/13/2015 due to symptoms of snoring, witnessed apnea, and nonrestorative sleep.  This demonstrated severe sleep apnea with an AHI of 57.4 per hour.  AHI during REM sleep was 49.4/h and with supine sleep was 78.1/h.  He had significant oxygen desaturation to 73% and time below 88% was 34.6 minutes.  CPAP titration was initiated.  During a split-night study, and a 12 cm water pressure was recommended. [facility] is his DME.  For some reason, after initiating CPAP therapy one year ago, he never presented to the sleep clinic for initial assessment.  However, since initiating CPAP therapy, he is sleeping better.  He is unaware of any breakthrough snoring.  I saw him for initial sleep evaluation and April 2018.  A download was obtained from 06/29/2016 through 07/28/2016.  He was meeting compliance  standards with 100% usage days and usage greater than 4 hours.  He is averaging 7 hours and 43 minutes of CPAP use per night.  AHI at 12 cwp is 4.4.  He has an air-fit F10, size small mask.  Further review of his download indicates that there were at least 3 nights where his AHI was >5/h with a peak up to 8.8.  Of note, he admits to a 30 pound weight gain over the past 6 months.  He is unaware of any breakthrough snoring.  His sleep is more restorative.  He is unaware of any parasomnias, or nocturnal palpitations. He denies daytime sleepiness, hypersomnolence, bruxism, restless legs, hypnogognic hallucinations, no cataplexy.  An Epworth Sleepiness Scale was recalculated in the office today which arteries against residual daytime sleepiness as shown below.  Epworth Sleepiness Scale: Situation   Chance of Dozing/Sleeping (0 = never , 1 = slight chance , 2 = moderate chance , 3 = high chance )   sitting and reading 2   watching TV 1   sitting inactive in a public place 0   being a passenger in a motor vehicle for an hour or more 0   lying down in the afternoon 3   sitting and talking to someone 0   sitting quietly after lunch (no alcohol) 1   while stopped for a few minutes in traffic as the driver 0   Total Score  7   Over the past year, Nicholas Gray continues to be compliant  with CPAP therapy.  He remains active and works 6 days/week.  He has more energy since initiating CPAP.  He typically works 11 hours/day.  He is up at 4 AM and works between 6 AM and 5 PM.  He typically goes to bed at 8 PM and is asleep by 9 PM.  On Saturday he works from 6 AM until noon.  New download was obtained in the office today from July 04, 2017 through August 02, 2017.  He is 100% compliant and is averaging 7 hours and 43 minutes of sleep per night.  His CPAP set pressure is 13 cm.  AHI is 4.1.  He denies residual daytime sleepiness.  Epworth Sleepiness Scale score was re-endorsed in the office today and this endorsed at 7.   He denies any breakthrough snoring.  He denies any bruxism, restless legs, sleep paralysis, cataplexy, or hypnagogic hallucinations.  He presents for yearly evaluation.   Past Medical History:  Diagnosis Date  . Cardiomyopathy    EF 30-40% (Normal coronaries cath 2010).  Echo 2015 EF 40%  . Colonic polyp   . Diverticulosis of colon   . GERD (gastroesophageal reflux disease)   . History of chicken pox   . HLD (hyperlipidemia)    recent  . HTN (hypertension)    recent  . Sleep apnea    does not use CPAP.      Past Surgical History:  Procedure Laterality Date  . TONSILLECTOMY AND ADENOIDECTOMY      Current Medications: Outpatient Medications Prior to Visit  Medication Sig Dispense Refill  . allopurinol (ZYLOPRIM) 100 MG tablet Take 100 mg by mouth 2 (two) times daily.     . carvedilol (COREG) 25 MG tablet TAKE (1) TABLET BY MOUTH TWICE DAILY WITH A MEAL. 60 tablet 11  . furosemide (LASIX) 20 MG tablet Take 20 mg by mouth daily as needed (swelling).    Marland Kitchen lisinopril (PRINIVIL,ZESTRIL) 10 MG tablet TAKE 2 TABLETS BY MOUTH TWICE DAILY. 120 tablet 11  . ranitidine (ZANTAC) 150 MG tablet Take 150 mg by mouth daily.    . simvastatin (ZOCOR) 40 MG tablet Take 40 mg by mouth at bedtime.    . furosemide (LASIX) 20 MG tablet Take 20 mg by mouth daily.     No facility-administered medications prior to visit.      Allergies:   Patient has no known allergies.   Social History   Socioeconomic History  . Marital status: Married    Spouse name: Not on file  . Number of children: Not on file  . Years of education: Not on file  . Highest education level: Not on file  Occupational History  . Not on file  Social Needs  . Financial resource strain: Not on file  . Food insecurity:    Worry: Not on file    Inability: Not on file  . Transportation needs:    Medical: Not on file    Non-medical: Not on file  Tobacco Use  . Smoking status: Former Smoker    Packs/day: 1.50    Years: 3.00     Pack years: 4.50    Last attempt to quit: 04/07/1980    Years since quitting: 37.3  . Smokeless tobacco: Never Used  Substance and Sexual Activity  . Alcohol use: No    Alcohol/week: 0.0 oz  . Drug use: No    Comment: quit in 1999, marijuana  . Sexual activity: Not on file  Lifestyle  . Physical activity:  Days per week: Not on file    Minutes per session: Not on file  . Stress: Not on file  Relationships  . Social connections:    Talks on phone: Not on file    Gets together: Not on file    Attends religious service: Not on file    Active member of club or organization: Not on file    Attends meetings of clubs or organizations: Not on file    Relationship status: Not on file  Other Topics Concern  . Not on file  Social History Narrative  . Not on file     Family History:  The patient's family history is notable for diabetes, hyperlipidemia, lung cancer, stroke, and arthritis.  ROS General: Negative; No fevers, chills, or night sweats;  HEENT: Negative; No changes in vision or hearing, sinus congestion, difficulty swallowing Pulmonary: Negative; No cough, wheezing, shortness of breath, hemoptysis Cardiovascular: History of nonischemic heart cardiomyopathy, hypertension; exertional dyspnea, occasional swelling, hyperlipidemia GI: Positive for GERD GU: Negative; No dysuria, hematuria, or difficulty voiding Musculoskeletal: Negative; no myalgias, joint pain, or weakness Hematologic/Oncology: Negative; no easy bruising, bleeding Endocrine: Negative; no heat/cold intolerance; no diabetes Neuro: Negative; no changes in balance, headaches Skin: Negative; No rashes or skin lesions Psychiatric: Negative; No behavioral problems, depression Sleep:  See HPI Other comprehensive 14 point system review is negative.   PHYSICAL EXAM:   VS:  BP 122/76   Pulse (!) 55   Ht _0  (1.778 m)   Wt 276 lb 6.4 oz (125.4 kg)   BMI 39.66 kg/m     Repeat blood pressure by me was  120/78  Wt Readings from Last 3 Encounters:  08/04/17 276 lb 6.4 oz (125.4 kg)  01/09/17 270 lb 6.4 oz (122.7 kg)  09/05/16 265 lb (120.2 kg)    General: Alert, oriented, no distress.  Skin: normal turgor, no rashes, warm and dry HEENT: Normocephalic, atraumatic. Pupils equal round and reactive to light; sclera anicteric; extraocular muscles intact;  Nose without nasal septal hypertrophy Mouth/Parynx benign; Mallinpatti scale 3 Neck: No JVD, no carotid bruits; normal carotid upstroke Lungs: clear to ausculatation and percussion; no wheezing or rales Chest wall: without tenderness to palpitation Heart: PMI not displaced, RRR, s1 s2 normal, 1/6 systolic murmur, no diastolic murmur, no rubs, gallops, thrills, or heaves Abdomen: soft, nontender; no hepatosplenomehaly, BS+; abdominal aorta nontender and not dilated by palpation. Back: no CVA tenderness Pulses 2+ Musculoskeletal: full range of motion, normal strength, no joint deformities Extremities: no clubbing cyanosis or edema, Homan's sign negative  Neurologic: grossly nonfocal; Cranial nerves grossly wnl Psychologic: Normal mood and affect   Studies/Labs Reviewed:   EKG:  EKG is ordered today.  ECG (independently read by me): This bradycardia 55 bpm.  Left bundle branch block with repolarization changes.  No ectopy.  ECG (independently read by me): Sinus bradycardia 57 bpm.  First-degree AV block with PR interval at 200 ms.  Left bundle branch block.  QTc interval 463 ms.  Recent Labs: BMP Latest Ref Rng & Units 01/08/2012 02/20/2011 11/13/2010  Glucose 70 - 99 mg/dL 98 99 106(H)  BUN 6 - 23 mg/dL _1 Creatinine 0.4 - 1.5 mg/dL 1.3 1.2 1.3  Sodium 135 - 145 mEq/L 134(L) 136 139  Potassium 3.5 - 5.1 mEq/L 4.9 4.2 5.0  Chloride 96 - 112 mEq/L 100 101 102  CO2 19 - 32 mEq/L _2 Calcium 8.4 - 10.5 mg/dL 9.1 9.1 9.2  Hepatic Function Latest Ref Rng & Units 01/08/2012 11/13/2010 01/30/2010  Total Protein 6.0 - 8.3 g/dL  7.1 7.2 7.3  Albumin 3.5 - 5.2 g/dL 4.3 4.4 4.3  AST 0 - 37 U/L _0 ALT 0 - 53 U/L _1 Alk Phosphatase 39 - 117 U/L 52 42 42  Total Bilirubin 0.3 - 1.2 mg/dL 0.5 0.7 0.8  Bilirubin, Direct 0.0 - 0.3 mg/dL 0.1 0.1 0.1    CBC Latest Ref Rng & Units 01/08/2012 11/13/2010 10/29/2009  WBC 4.5 - 10.5 K/uL 6.0 6.8 6.6  Hemoglobin 13.0 - 17.0 g/dL 12.7(L) 12.8(L) 12.9(L)  Hematocrit 39.0 - 52.0 % 37.3(L) 37.5(L) 36.7(L)  Platelets 150.0 - 400.0 K/uL 200.0 205.0 221.0   Lab Results  Component Value Date   MCV 90.9 01/08/2012   MCV 91.1 11/13/2010   MCV 92.0 10/29/2009   Lab Results  Component Value Date   TSH 1.47 10/29/2009   No results found for: HGBA1C   BNP No results found for: BNP  ProBNP    Component Value Date/Time   PROBNP <30.0 03/12/2009 1249     Lipid Panel     Component Value Date/Time   CHOL 116 01/08/2012 0804   TRIG 191.0 (H) 01/08/2012 0804   HDL 25.70 (L) 01/08/2012 0804   CHOLHDL 5 01/08/2012 0804   VLDL 38.2 01/08/2012 0804   LDLCALC 52 01/08/2012 0804   LDLDIRECT 161.2 10/19/2008 0946     RADIOLOGY: No results found.   Additional studies/ records that were reviewed today include:  I review the office records of Dr. Charlesetta Shanks, the patient's sleep study with CPAP titration, and have obtain a download from March 25 through 07/28/2016.    ASSESSMENT:    1. OSA (obstructive sleep apnea)   2. Dilated cardiomyopathy ([place])   3. Essential hypertension   4. Class 2 severe obesity due to excess calories with serious comorbidity and body mass index (BMI) of 39.0 to 39.9 in adult [facility])      PLAN:  Nicholas Gray is a 57 year old male who has a history of a nonischemic cardiomyopathy with an ejection fraction initially at 30-35%, and most recently 35-40%.  In February 2017 a sleep study demonstrated severe sleep apnea with an AHI 57.4 per hour and significant oxygen desaturation to a nadir of 73%.  He had mild periodic limb movements  during that study but not been symptomatic with restless legs.  He has been on CPAP therapy since 2017.  Since his initial study his weight has increased from 250 up to 276 pounds.  He has borderline morbid obesity.  He feels improved since initiating CPAP therapy and is meeting compliance standards with 100% use.  His sleep is more restorative, his nocturia has decreased, he is unaware of breakthrough snoring.  I reviewed his download with him in detail.  AHI is 4.1 on his 13 cm set fixed CPAP pressure.  He is 100% compliant and has adequate sleep duration.  He denies any awareness of breakthrough snoring, there is no residual daytime sleepiness.  His blood pressure today is now stable on his regimen consisting of carvedilol 25 mg twice a day, lisinopril 20 mg twice a day and he has a prescription for furosemide to take as needed.  He is on simvastatin for hyperlipidemia.  A sleep perspective he is stable.  He will return to the primary cardiology care with Dr. Percival Spanish.  I will see him in 1 year for sleep reevaluation.  Medication  Adjustments/Labs and Tests Ordered: Current medicines are reviewed at length with the patient today.  Concerns regarding medicines are outlined above.  Medication changes, Labs and Tests ordered today are listed in the Patient Instructions below. Patient Instructions  Your physician wants you to follow-up in: 12 months with Dr. Claiborne Billings (sleep clinic). You will receive a reminder letter in the mail two months in advance. If you don't receive a letter, please call our office to schedule the follow-up appointment.    Time spent 25 minutes Signed, Shelva Majestic, MD  08/11/2017 6:52 PM    [facility] [address], [place], [place], [place]  [address] Phone: [phone]

## 2017-08-04 NOTE — Patient Instructions (Signed)
Your physician wants you to follow-up in: 12 months with Dr. Kelly (sleep clinic).  You will receive a reminder letter in the mail two months in advance. If you don't receive a letter, please call our office to schedule the follow-up appointment.  

## 2017-08-11 ENCOUNTER — Encounter: Payer: Self-pay | Admitting: Cardiovascular Disease

## 2017-08-12 ENCOUNTER — Encounter: Payer: Self-pay | Admitting: *Deleted

## 2017-08-12 NOTE — Progress Notes (Signed)
     HPI The patient presents for followup of his cardiomyopathy.  Since I last saw him he is gotten a new job.  He was only out of work for about a week and a half.  He feels well.  The patient denies any new symptoms such as chest discomfort, neck or arm discomfort. There has been no new shortness of breath, PND or orthopnea. There have been no reported palpitations, presyncope or syncope.    He is doing some [place] hunting.  He has gained a little weight.  No Known Allergies  Current Outpatient Medications  Medication Sig Dispense Refill  . allopurinol (ZYLOPRIM) 100 MG tablet Take 100 mg by mouth 2 (two) times daily.     . carvedilol (COREG) 25 MG tablet TAKE (1) TABLET BY MOUTH TWICE DAILY WITH A MEAL. 60 tablet 11  . furosemide (LASIX) 20 MG tablet Take 20 mg by mouth daily as needed (swelling).    Marland Kitchen lisinopril (PRINIVIL,ZESTRIL) 10 MG tablet TAKE 2 TABLETS BY MOUTH TWICE DAILY. 120 tablet 11  . ranitidine (ZANTAC) 150 MG tablet Take 150 mg by mouth daily.    . simvastatin (ZOCOR) 40 MG tablet Take 40 mg by mouth at bedtime.     No current facility-administered medications for this visit.     Past Medical History:  Diagnosis Date  . Cardiomyopathy    EF 30-40% (Normal coronaries cath 2010).  Echo 2015 EF 40%  . Colonic polyp   . Diverticulosis of colon   . GERD (gastroesophageal reflux disease)   . History of chicken pox   . HLD (hyperlipidemia)    recent  . HTN (hypertension)    recent  . Sleep apnea    does not use CPAP.      Past Surgical History:  Procedure Laterality Date  . TONSILLECTOMY AND ADENOIDECTOMY      ROS:   As stated in the HPI and negative for all other systems.  PHYSICAL EXAM BP 124/82   Pulse (!) 55   Ht 5\' 10"  (1.778 m)   Wt 279 lb 12.8 oz (126.9 kg)   BMI 40.15 kg/m   GENERAL:  Well appearing NECK:  No jugular venous distention, waveform within normal limits, carotid upstroke brisk and symmetric, no bruits, no thyromegaly LUNGS:  Clear to  auscultation bilaterally CHEST:  Unremarkable HEART:  PMI not displaced or sustained,S1 and S2 within normal limits, no S3, no S4, no clicks, no rubs, no murmurs ABD:  Flat, positive bowel sounds normal in frequency in pitch, no bruits, no rebound, no guarding, no midline pulsatile mass, no hepatomegaly, no splenomegaly EXT:  2 plus pulses throughout, no edema, no cyanosis no clubbing   EKG:  NA   ASSESSMENT AND PLAN  CARDIOMYOPATHY -  His EF is 40% on echo in 2017.  I will check another echo.  If his EF is still at this level I will likely change to [facility] and stop the lisinopril.  HYPERTENSION -  This is being managed in the context of treating his CHF  OBESITY, UNSPECIFIED -  I have asked him to lose 3 lbs per month.   SLEEP APNEA -  He is having this followed by Dr. Claiborne Billings.   He has good response.   EDEMA - This is very mild.  No change in therapy.

## 2017-08-13 ENCOUNTER — Encounter: Payer: Self-pay | Admitting: Cardiology

## 2017-08-13 ENCOUNTER — Ambulatory Visit: Payer: 59 | Admitting: Cardiology

## 2017-08-13 VITALS — BP 124/82 | HR 55 | Ht 70.0 in | Wt 279.8 lb

## 2017-08-13 DIAGNOSIS — G4730 Sleep apnea, unspecified: Secondary | ICD-10-CM

## 2017-08-13 DIAGNOSIS — I420 Dilated cardiomyopathy: Secondary | ICD-10-CM

## 2017-08-13 DIAGNOSIS — I10 Essential (primary) hypertension: Secondary | ICD-10-CM

## 2017-08-13 NOTE — Patient Instructions (Signed)
Medication Instructions:  Continue current medications  If you need a refill on your cardiac medications before your next appointment, please call your pharmacy.  Labwork: None Ordered   Testing/Procedures: Your physician has requested that you have an echocardiogram. Echocardiography is a painless test that uses sound waves to create images of your heart. It provides your doctor with information about the size and shape of your heart and how well your heart's chambers and valves are working. This procedure takes approximately one hour. There are no restrictions for this procedure.  Follow-Up: Your physician wants you to follow-up in: 6 Months. You should receive a reminder letter in the mail two months in advance. If you do not receive a letter, please call our office [phone].     Thank you for choosing [facility]!!      

## 2017-08-27 ENCOUNTER — Ambulatory Visit (HOSPITAL_COMMUNITY): Payer: 59 | Attending: Cardiology

## 2017-08-27 ENCOUNTER — Other Ambulatory Visit: Payer: Self-pay

## 2017-08-27 DIAGNOSIS — I272 Pulmonary hypertension, unspecified: Secondary | ICD-10-CM | POA: Diagnosis not present

## 2017-08-27 DIAGNOSIS — I10 Essential (primary) hypertension: Secondary | ICD-10-CM | POA: Diagnosis not present

## 2017-08-27 DIAGNOSIS — G4733 Obstructive sleep apnea (adult) (pediatric): Secondary | ICD-10-CM | POA: Diagnosis not present

## 2017-08-27 DIAGNOSIS — E119 Type 2 diabetes mellitus without complications: Secondary | ICD-10-CM | POA: Insufficient documentation

## 2017-08-27 DIAGNOSIS — I420 Dilated cardiomyopathy: Secondary | ICD-10-CM | POA: Diagnosis not present

## 2017-08-27 DIAGNOSIS — E785 Hyperlipidemia, unspecified: Secondary | ICD-10-CM | POA: Diagnosis not present

## 2017-08-27 DIAGNOSIS — I081 Rheumatic disorders of both mitral and tricuspid valves: Secondary | ICD-10-CM | POA: Insufficient documentation

## 2017-09-03 ENCOUNTER — Emergency Department (HOSPITAL_COMMUNITY): Payer: 59

## 2017-09-03 ENCOUNTER — Other Ambulatory Visit: Payer: Self-pay

## 2017-09-03 ENCOUNTER — Encounter (HOSPITAL_COMMUNITY): Payer: Self-pay | Admitting: Emergency Medicine

## 2017-09-03 ENCOUNTER — Inpatient Hospital Stay (HOSPITAL_COMMUNITY)
Admission: EM | Admit: 2017-09-03 | Discharge: 2017-09-13 | DRG: 417 | Disposition: A | Discharge status: Home / Self Care | Payer: 59 | Attending: Family Medicine | Admitting: Family Medicine

## 2017-09-03 DIAGNOSIS — R079 Chest pain, unspecified: Secondary | ICD-10-CM | POA: Diagnosis present

## 2017-09-03 DIAGNOSIS — Z87891 Personal history of nicotine dependence: Secondary | ICD-10-CM

## 2017-09-03 DIAGNOSIS — I10 Essential (primary) hypertension: Secondary | ICD-10-CM | POA: Diagnosis not present

## 2017-09-03 DIAGNOSIS — R1011 Right upper quadrant pain: Secondary | ICD-10-CM | POA: Diagnosis not present

## 2017-09-03 DIAGNOSIS — Y836 Removal of other organ (partial) (total) as the cause of abnormal reaction of the patient, or of later complication, without mention of misadventure at the time of the procedure: Secondary | ICD-10-CM | POA: Diagnosis not present

## 2017-09-03 DIAGNOSIS — R072 Precordial pain: Secondary | ICD-10-CM | POA: Diagnosis not present

## 2017-09-03 DIAGNOSIS — I272 Pulmonary hypertension, unspecified: Secondary | ICD-10-CM | POA: Diagnosis present

## 2017-09-03 DIAGNOSIS — G4733 Obstructive sleep apnea (adult) (pediatric): Secondary | ICD-10-CM | POA: Diagnosis present

## 2017-09-03 DIAGNOSIS — K760 Fatty (change of) liver, not elsewhere classified: Secondary | ICD-10-CM | POA: Diagnosis present

## 2017-09-03 DIAGNOSIS — K8000 Calculus of gallbladder with acute cholecystitis without obstruction: Secondary | ICD-10-CM | POA: Diagnosis not present

## 2017-09-03 DIAGNOSIS — K810 Acute cholecystitis: Secondary | ICD-10-CM | POA: Diagnosis not present

## 2017-09-03 DIAGNOSIS — Z6841 Body Mass Index (BMI) 40.0 and over, adult: Secondary | ICD-10-CM

## 2017-09-03 DIAGNOSIS — R1010 Upper abdominal pain, unspecified: Secondary | ICD-10-CM

## 2017-09-03 DIAGNOSIS — E861 Hypovolemia: Secondary | ICD-10-CM | POA: Diagnosis present

## 2017-09-03 DIAGNOSIS — I447 Left bundle-branch block, unspecified: Secondary | ICD-10-CM | POA: Diagnosis not present

## 2017-09-03 DIAGNOSIS — I5023 Acute on chronic systolic (congestive) heart failure: Secondary | ICD-10-CM | POA: Diagnosis present

## 2017-09-03 DIAGNOSIS — E785 Hyperlipidemia, unspecified: Secondary | ICD-10-CM | POA: Diagnosis present

## 2017-09-03 DIAGNOSIS — E669 Obesity, unspecified: Secondary | ICD-10-CM | POA: Diagnosis present

## 2017-09-03 DIAGNOSIS — R0602 Shortness of breath: Secondary | ICD-10-CM | POA: Diagnosis not present

## 2017-09-03 DIAGNOSIS — K5900 Constipation, unspecified: Secondary | ICD-10-CM | POA: Diagnosis present

## 2017-09-03 DIAGNOSIS — K219 Gastro-esophageal reflux disease without esophagitis: Secondary | ICD-10-CM | POA: Diagnosis present

## 2017-09-03 DIAGNOSIS — I5022 Chronic systolic (congestive) heart failure: Secondary | ICD-10-CM | POA: Diagnosis present

## 2017-09-03 DIAGNOSIS — K2900 Acute gastritis without bleeding: Secondary | ICD-10-CM | POA: Diagnosis present

## 2017-09-03 DIAGNOSIS — I252 Old myocardial infarction: Secondary | ICD-10-CM

## 2017-09-03 DIAGNOSIS — J95812 Postprocedural air leak: Secondary | ICD-10-CM | POA: Diagnosis not present

## 2017-09-03 DIAGNOSIS — K839 Disease of biliary tract, unspecified: Secondary | ICD-10-CM

## 2017-09-03 DIAGNOSIS — I420 Dilated cardiomyopathy: Secondary | ICD-10-CM | POA: Diagnosis present

## 2017-09-03 DIAGNOSIS — N179 Acute kidney failure, unspecified: Secondary | ICD-10-CM | POA: Diagnosis present

## 2017-09-03 DIAGNOSIS — Z79899 Other long term (current) drug therapy: Secondary | ICD-10-CM

## 2017-09-03 DIAGNOSIS — I110 Hypertensive heart disease with heart failure: Secondary | ICD-10-CM | POA: Diagnosis present

## 2017-09-03 HISTORY — DX: Heart failure, unspecified: I50.9

## 2017-09-03 HISTORY — DX: Unspecified osteoarthritis, unspecified site: M19.90

## 2017-09-03 LAB — CBC
HCT: 37 % — ABNORMAL LOW (ref 39.0–52.0)
Hemoglobin: 12.7 g/dL — ABNORMAL LOW (ref 13.0–17.0)
MCH: 30.9 pg (ref 26.0–34.0)
MCHC: 34.3 g/dL (ref 30.0–36.0)
MCV: 90 fL (ref 78.0–100.0)
PLATELETS: 218 10*3/uL (ref 150–400)
RBC: 4.11 MIL/uL — ABNORMAL LOW (ref 4.22–5.81)
RDW: 12.5 % (ref 11.5–15.5)
WBC: 8.1 10*3/uL (ref 4.0–10.5)

## 2017-09-03 LAB — BASIC METABOLIC PANEL
Anion gap: 8 (ref 5–15)
BUN: 20 mg/dL (ref 6–20)
CALCIUM: 9.8 mg/dL (ref 8.9–10.3)
CO2: 24 mmol/L (ref 22–32)
CREATININE: 1.46 mg/dL — AB (ref 0.61–1.24)
Chloride: 102 mmol/L (ref 101–111)
GFR calc Af Amer: 59 mL/min — ABNORMAL LOW (ref >60)
GFR calc non Af Amer: 51 mL/min — ABNORMAL LOW (ref >60)
GLUCOSE: 114 mg/dL — AB (ref 65–99)
Potassium: 4.9 mmol/L (ref 3.5–5.1)
Sodium: 134 mmol/L — ABNORMAL LOW (ref 135–145)

## 2017-09-03 LAB — TROPONIN I: Troponin I: 0.03 ng/mL (ref <0.03)

## 2017-09-03 LAB — BRAIN NATRIURETIC PEPTIDE: B Natriuretic Peptide: 7.1 pg/mL (ref 0.0–100.0)

## 2017-09-03 LAB — I-STAT TROPONIN, ED
TROPONIN I, POC: 0.01 ng/mL (ref 0.00–0.08)
Troponin i, poc: 0.01 ng/mL (ref 0.00–0.08)

## 2017-09-03 MED ORDER — NITROGLYCERIN 0.4 MG SL SUBL
0.4000 mg | SUBLINGUAL_TABLET | SUBLINGUAL | Status: DC | PRN
  Administered 2017-09-03 (×2): 0.4 mg via SUBLINGUAL
  Filled 2017-09-03 (×2): qty 1

## 2017-09-03 MED ORDER — GI COCKTAIL ~~LOC~~
30.0000 mL | Freq: Four times a day (QID) | ORAL | Status: DC | PRN
  Administered 2017-09-04: 30 mL via ORAL
  Filled 2017-09-03: qty 30

## 2017-09-03 MED ORDER — ONDANSETRON HCL 4 MG/2ML IJ SOLN: 4.0000 mg | Freq: Four times a day (QID) | INTRAMUSCULAR | Status: DC | PRN

## 2017-09-03 MED ORDER — SIMVASTATIN 40 MG PO TABS
40.0000 mg | ORAL_TABLET | Freq: Every day | ORAL | Status: DC
  Administered 2017-09-03 – 2017-09-12 (×10): 40 mg via ORAL
  Filled 2017-09-03 (×11): qty 1

## 2017-09-03 MED ORDER — FAMOTIDINE 20 MG PO TABS
20.0000 mg | ORAL_TABLET | Freq: Every day | ORAL | Status: DC
  Administered 2017-09-04 – 2017-09-13 (×10): 20 mg via ORAL
  Filled 2017-09-03 (×10): qty 1

## 2017-09-03 MED ORDER — ASPIRIN 81 MG PO CHEW
324.0000 mg | CHEWABLE_TABLET | Freq: Once | ORAL | Status: AC
  Administered 2017-09-03: 324 mg via ORAL
  Filled 2017-09-03: qty 4

## 2017-09-03 MED ORDER — MORPHINE SULFATE (PF) 4 MG/ML IV SOLN
2.0000 mg | INTRAVENOUS | Status: DC | PRN
  Administered 2017-09-03 – 2017-09-10 (×8): 2 mg via INTRAVENOUS
  Filled 2017-09-03 (×9): qty 1

## 2017-09-03 MED ORDER — LISINOPRIL 20 MG PO TABS
20.0000 mg | ORAL_TABLET | Freq: Two times a day (BID) | ORAL | Status: DC
  Administered 2017-09-03 – 2017-09-05 (×4): 20 mg via ORAL
  Filled 2017-09-03 (×4): qty 1

## 2017-09-03 MED ORDER — CARVEDILOL 25 MG PO TABS
25.0000 mg | ORAL_TABLET | Freq: Two times a day (BID) | ORAL | Status: DC
  Administered 2017-09-03 – 2017-09-13 (×18): 25 mg via ORAL
  Filled 2017-09-03 (×18): qty 1

## 2017-09-03 MED ORDER — ACETAMINOPHEN 325 MG PO TABS: 650.0000 mg | ORAL_TABLET | ORAL | Status: DC | PRN

## 2017-09-03 MED ORDER — ALLOPURINOL 100 MG PO TABS
100.0000 mg | ORAL_TABLET | Freq: Two times a day (BID) | ORAL | Status: DC
  Administered 2017-09-03 – 2017-09-13 (×19): 100 mg via ORAL
  Filled 2017-09-03 (×19): qty 1

## 2017-09-03 MED ORDER — ENOXAPARIN SODIUM 40 MG/0.4ML ~~LOC~~ SOLN
40.0000 mg | SUBCUTANEOUS | Status: DC
  Administered 2017-09-03 – 2017-09-12 (×10): 40 mg via SUBCUTANEOUS
  Filled 2017-09-03 (×10): qty 0.4

## 2017-09-03 NOTE — ED Provider Notes (Signed)
[facility] DEPART[facility] Provider Note   CSN: [id number] Arrival date & time: 09/03/17  1102     History   Chief Complaint Chief Complaint  Patient presents with  . Chest Pain    HPI Nicholas Gray is a 57 y.o. male.  HPI  Nicholas Gray is a 57 year old male with a history of hypertension, hyperlipidemia, dilated cardiomyopathy (EF 25 to 30%), OSA, obesity who presents to the emergency department for evaluation of chest pain.  Patient reports that his pain began around 10 AM today.  Pain was gradual in onset.  He reports pain is substernal and radiates to his back.  Pain has fluctuated throughout the day, but never gone completely away.  He has difficulty describing the pain, states it feels uncomfortable and as if someone is pushing on his chest.  No known trigger to pain.  Reports associated shortness of breath.  Also states he felt lightheaded and nauseous earlier today but those symptoms have resolved.  He tried taking some Tums earlier today as he has history of acid reflux, but this did not seem to help his symptoms.  He denies history of exertional chest pain.  Denies tobacco use.  Denies family history of MI that he is aware of.  Denies diaphoresis, fevers, chills, congestion, sore throat, cough, abdominal pain, leg swelling, numbness, weakness.  Denies history of PE/DVT, active cancer, pleuritic chest pain, calf tenderness or swelling, recent surgery or exogenous hormone use.  Past Medical History:  Diagnosis Date  . Cardiomyopathy    EF 30-40% (Normal coronaries cath 2010).  Echo 2015 EF 40%  . Colonic polyp   . Diverticulosis of colon   . GERD (gastroesophageal reflux disease)   . History of chicken pox   . HLD (hyperlipidemia)    recent  . HTN (hypertension)    recent  . Sleep apnea    does not use CPAP.      Patient Active Problem List   Diagnosis Date Noted  . ACTINIC KERATOSIS 11/01/2009  . Dilated cardiomyopathy ([place]) 06/12/2009  .  SLEEP APNEA 05/02/2009  . CHF 03/05/2009  . Morbid obesity ([place]) 01/31/2009  . LBBB (left bundle branch block) 12/25/2008  . HYPERLIPIDEMIA 11/22/2008  . COLONIC POLYPS 10/19/2008  . Essential hypertension 10/19/2008  . GERD 10/19/2008  . DIVERTICULITIS, COLON 10/19/2008    Past Surgical History:  Procedure Laterality Date  . TONSILLECTOMY AND ADENOIDECTOMY          Home Medications    Prior to Admission medications   Medication Sig Start Date End Date Taking? Authorizing Provider  allopurinol (ZYLOPRIM) 100 MG tablet Take 100 mg by mouth 2 (two) times daily.     [provider]  carvedilol (COREG) 25 MG tablet TAKE (1) TABLET BY MOUTH TWICE DAILY WITH A MEAL. 01/09/17   Minus Breeding, MD  furosemide (LASIX) 20 MG tablet Take 20 mg by mouth daily as needed (swelling).    [provider]  lisinopril (PRINIVIL,ZESTRIL) 10 MG tablet TAKE 2 TABLETS BY MOUTH TWICE DAILY. 01/09/17   Minus Breeding, MD  ranitidine (ZANTAC) 150 MG tablet Take 150 mg by mouth daily.    [provider]  simvastatin (ZOCOR) 40 MG tablet Take 40 mg by mouth at bedtime.    [provider]    Family History Family History  Problem Relation Age of Onset  . Arthritis Unknown        family history  . Diabetes Unknown  family history  . Hyperlipidemia Unknown        family history  . Lung cancer Unknown        family history  . Stroke Unknown        family history    Social History Social History   Tobacco Use  . Smoking status: Former Smoker    Packs/day: 1.50    Years: 3.00    Pack years: 4.50    Last attempt to quit: 04/07/1980    Years since quitting: 37.4  . Smokeless tobacco: Never Used  Substance Use Topics  . Alcohol use: No    Alcohol/week: 0.0 oz  . Drug use: No    Comment: quit in 1999, marijuana     Allergies   Patient has no known allergies.   Review of Systems Review of Systems  Constitutional: Negative for chills and fever.    HENT: Negative for congestion and sore throat.   Eyes: Negative for visual disturbance.  Respiratory: Positive for shortness of breath. Negative for cough and wheezing.   Cardiovascular: Positive for chest pain.  Gastrointestinal: Positive for nausea. Negative for abdominal pain and vomiting.  Genitourinary: Negative for difficulty urinating.  Musculoskeletal: Positive for back pain.  Skin: Negative for color change.  Neurological: Positive for light-headedness. Negative for weakness, numbness and headaches.  Psychiatric/Behavioral: Negative for agitation.     Physical Exam Updated Vital Signs BP 138/74   Pulse 60   Temp (!) 97.5 F (36.4 C) (Oral)   Resp 20   Ht 5\' 10"  (1.778 m)   Wt 126.6 kg (279 lb)   SpO2 99%   BMI 40.03 kg/m   Physical Exam  Constitutional: He is oriented to person, place, and time. He appears well-developed and well-nourished. No distress.  Sitting at bedside in no apparent distress, non-toxic appearing.   HENT:  Head: Normocephalic and atraumatic.  Mouth/Throat: Oropharynx is clear and moist.  Eyes: Pupils are equal, round, and reactive to light. Conjunctivae are normal. Right eye exhibits no discharge. Left eye exhibits no discharge.  Neck: Normal range of motion. Neck supple. No JVD present. No tracheal deviation present.  Cardiovascular: Normal rate and regular rhythm.  Radial pulses 2+ bilaterally.   Pulmonary/Chest: Effort normal and breath sounds normal. No stridor. No respiratory distress. He has no wheezes. He has no rales.  Anterior chest wall non-tender to palpation.   Abdominal: Soft. Bowel sounds are normal. There is no tenderness. There is no guarding.  Musculoskeletal:  No leg swelling or calf tenderness.  Neurological: He is alert and oriented to person, place, and time. Coordination normal.  Skin: Skin is warm and dry. He is not diaphoretic.  Psychiatric: He has a normal mood and affect. His behavior is normal.  Nursing note and  vitals reviewed.    ED Treatments / Results  Labs (all labs ordered are listed, but only abnormal results are displayed) Labs Reviewed  BASIC METABOLIC PANEL - Abnormal; Notable for the following components:      Result Value   Sodium 134 (*)    Glucose, Bld 114 (*)    Creatinine, Ser 1.46 (*)    GFR calc non Af Amer 51 (*)    GFR calc Af Amer 59 (*)    All other components within normal limits  CBC - Abnormal; Notable for the following components:   RBC 4.11 (*)    Hemoglobin 12.7 (*)    HCT 37.0 (*)    All other components within normal limits  BRAIN NATRIURETIC PEPTIDE  I-STAT TROPONIN, ED  I-STAT TROPONIN, ED    EKG EKG Interpretation  Date/Time:  Thursday Sep 03 2017 11:07:58 EDT Ventricular Rate:  57 PR Interval:  174 QRS Duration: 166 QT Interval:  454 QTC Calculation: 441 R Axis:   -3 Text Interpretation:  Sinus bradycardia Left bundle branch block Abnormal ECG no significant change since 2010 Confirmed by Sherwood Gambler [phone]) on 09/03/2017 3:46:02 PM   Radiology Dg Chest 2 View  Result Date: 09/03/2017 CLINICAL DATA:  Chest pain and shortness of Breath EXAM: CHEST - 2 VIEW COMPARISON:  03/12/2009 FINDINGS: Cardiac shadow is mildly enlarged. Minimal left basilar atelectasis is seen in the lingula. No sizable effusion is noted. No acute bony abnormality is seen. IMPRESSION: Mild left basilar atelectasis. Electronically Signed   By: Inez Catalina M.D.   On: 09/03/2017 11:52    Procedures Procedures (including critical care time)  Medications Ordered in ED Medications - No data to display   Initial Impression / Assessment and Plan / ED Course  I have reviewed the triage vital signs and the nursing notes.  Pertinent labs & imaging results that were available during my care of the patient were reviewed by me and considered in my medical decision making (see chart for details).     Patient with a history of advanced heart failure (EF 25-30%),  hypertension, hyperlipidemia who presents to the emergency department for evaluation of chest pain and shortness of breath.  No prior history of coronary artery disease. He is one of Dr. Rosezella Florida patients.  On exam vital signs stable.  Lungs clear to auscultation.  No significant leg swelling or signs of fluid overload.  BNP within normal limits.  Doubt CHF exacerbation given no pulmonary edema on chest x-ray, no signs of fluid overload on exam and BNP normal.  Delta troponin negative, EKG nonischemic and chest x-ray without acute abnormality.  No improvement with sublingual nitroglycerin or aspirin.  On recheck he continues to report 5/10 severity chest pain.  He has a HEART score of 4, plan to admit to Hospitalist service. Although pain radiates to the back, no widening of the mediastinum, pulse deficits, neurological symptoms or new murmur and doubt aortic dissection.  Chest x-ray without pneumonia, pneumomediastinum or pneumothorax.  Patient afebrile and nontoxic-appearing, no concern for esophageal perforation at this time.  This was a shared visit with Dr. Regenia Skeeter who also saw the patient and agrees with plan to admit.   Spoke with Dr. Alcario Drought who will admit patient to medicine.    Final Clinical Impressions(s) / ED Diagnoses   Final diagnoses:  None    ED Discharge Orders    None       Bernarda Caffey 09/03/17 2118    Sherwood Gambler, MD 09/09/17 [id number]

## 2017-09-03 NOTE — ED Notes (Signed)
Difficult IV start x 2  

## 2017-09-03 NOTE — ED Triage Notes (Signed)
Pt arrives via POV from work place where patient was performing regular duties, per patient nothing strenuous developed substernal CP heaviness and pressure radiating to back. Tried taking tums for relief. C/o nausea, lightheaded sensation with the pain. Hx of CHF and LBBB.

## 2017-09-03 NOTE — ED Notes (Signed)
Called lab coming to draw blood

## 2017-09-03 NOTE — H&P (Signed)
History and Physical    PAITON FOSCO SWH:[id number] DOB: 08-22-1960 DOA: 09/03/2017  PCP: [facility] [place]  Patient coming from: Home  I have personally briefly reviewed patient's old medical records in [place]  Chief Complaint: CP  HPI: SONNIE PAWLOSKI is a 57 y.o. male with medical history significant of dilated cardiomyopathy, EF down to 25-30% on echo done just 7 days ago; HTN, OSA on CPAP.  Clean coronaries on heart cath 2010.  HLD.  Patient presents to the ED with c/o CP.  Onset ~10AM today, gradual in onset.  Substernal radiates to back.  Fluctuated throughout day but never gone away completely.  No trigger to pain.  Tums he took earlier didn't help.  Feels like someone is pushing on his chest.  Does have associated SOB, no exertional CP history.  No h/o PE/DVT.  No recent BLE edema, leg or calf swelling.   ED Course: Trop neg x2 thus far.  EKG shows LBBB (old).   Review of Systems: As per HPI otherwise 10 point review of systems negative.   Past Medical History:  Diagnosis Date  . Cardiomyopathy    EF 30-40% (Normal coronaries cath 2010).  Echo 2015 EF 40%  . Colonic polyp   . Diverticulosis of colon   . GERD (gastroesophageal reflux disease)   . History of chicken pox   . HLD (hyperlipidemia)    recent  . HTN (hypertension)    recent  . Sleep apnea    does use CPAP.      Past Surgical History:  Procedure Laterality Date  . TONSILLECTOMY AND ADENOIDECTOMY       reports that he quit smoking about 37 years ago. He has a 4.50 pack-year smoking history. He has never used smokeless tobacco. He reports that he does not drink alcohol or use drugs.  No Known Allergies  Family History  Problem Relation Age of Onset  . Arthritis Unknown        family history  . Diabetes Unknown        family history  . Hyperlipidemia Unknown        family history  . Lung cancer Unknown        family history  . Stroke Unknown        family  history     Prior to Admission medications   Medication Sig Start Date End Date Taking? Authorizing Provider  allopurinol (ZYLOPRIM) 100 MG tablet Take 100 mg by mouth 2 (two) times daily.    Yes [provider]  carvedilol (COREG) 25 MG tablet TAKE (1) TABLET BY MOUTH TWICE DAILY WITH A MEAL. 01/09/17  Yes Minus Breeding, MD  furosemide (LASIX) 20 MG tablet Take 20 mg by mouth daily as needed (swelling).   Yes [provider]  lisinopril (PRINIVIL,ZESTRIL) 10 MG tablet TAKE 2 TABLETS BY MOUTH TWICE DAILY. 01/09/17  Yes Minus Breeding, MD  ranitidine (ZANTAC) 150 MG tablet Take 150 mg by mouth daily.   Yes [provider]  simvastatin (ZOCOR) 40 MG tablet Take 40 mg by mouth at bedtime.   Yes [provider]    Physical Exam: Vitals:   09/03/17 1730 09/03/17 1744 09/03/17 1745 09/03/17 1800  BP: (!) 142/72 137/72 137/72 138/73  Pulse: 61 (!) 59 63 64  Resp: 17 18 (!) 21 19  Temp:  98.2 F (36.8 C)    TempSrc:  Oral    SpO2: 98% 99% 99% 99%  Weight:  Height:        Constitutional: NAD, calm, comfortable Eyes: PERRL, lids and conjunctivae normal ENMT: Mucous membranes are moist. Posterior pharynx clear of any exudate or lesions.Normal dentition.  Neck: normal, supple, no masses, no thyromegaly Respiratory: clear to auscultation bilaterally, no wheezing, no crackles. Normal respiratory effort. No accessory muscle use.  Cardiovascular: Regular rate and rhythm, no murmurs / rubs / gallops. No extremity edema. 2+ pedal pulses. No carotid bruits.  Abdomen: no tenderness, no masses palpated. No hepatosplenomegaly. Bowel sounds positive.  Musculoskeletal: no clubbing / cyanosis. No joint deformity upper and lower extremities. Good ROM, no contractures. Normal muscle tone.  Skin: no rashes, lesions, ulcers. No induration Neurologic: CN 2-12 grossly intact. Sensation intact, DTR normal. Strength 5/5 in all 4.  Psychiatric: Normal judgment and  insight. Alert and oriented x 3. Normal mood.    Labs on Admission: I have personally reviewed following labs and imaging studies  CBC: Recent Labs  Lab 09/03/17 1115  WBC 8.1  HGB 12.7*  HCT 37.0*  MCV 90.0  PLT 175   Basic Metabolic Panel: Recent Labs  Lab 09/03/17 1115  NA 134*  K 4.9  CL 102  CO2 24  GLUCOSE 114*  BUN 20  CREATININE 1.46*  CALCIUM 9.8   GFR: Estimated Creatinine Clearance: 73.6 mL/min (A) (by C-G formula based on SCr of 1.46 mg/dL (H)). Liver Function Tests: No results for input(s): AST, ALT, ALKPHOS, BILITOT, PROT, ALBUMIN in the last 168 hours. No results for input(s): LIPASE, AMYLASE in the last 168 hours. No results for input(s): AMMONIA in the last 168 hours. Coagulation Profile: No results for input(s): INR, PROTIME in the last 168 hours. Cardiac Enzymes: No results for input(s): CKTOTAL, CKMB, CKMBINDEX, TROPONINI in the last 168 hours. BNP (last 3 results) No results for input(s): PROBNP in the last 8760 hours. HbA1C: No results for input(s): HGBA1C in the last 72 hours. CBG: No results for input(s): GLUCAP in the last 168 hours. Lipid Profile: No results for input(s): CHOL, HDL, LDLCALC, TRIG, CHOLHDL, LDLDIRECT in the last 72 hours. Thyroid Function Tests: No results for input(s): TSH, T4TOTAL, FREET4, T3FREE, THYROIDAB in the last 72 hours. Anemia Panel: No results for input(s): VITAMINB12, FOLATE, FERRITIN, TIBC, IRON, RETICCTPCT in the last 72 hours. Urine analysis: No results found for: COLORURINE, APPEARANCEUR, LABSPEC, [place], GLUCOSEU, HGBUR, BILIRUBINUR, KETONESUR, PROTEINUR, UROBILINOGEN, NITRITE, LEUKOCYTESUR  Radiological Exams on Admission: Dg Chest 2 View  Result Date: 09/03/2017 CLINICAL DATA:  Chest pain and shortness of Breath EXAM: CHEST - 2 VIEW COMPARISON:  03/12/2009 FINDINGS: Cardiac shadow is mildly enlarged. Minimal left basilar atelectasis is seen in the lingula. No sizable effusion is noted. No acute bony  abnormality is seen. IMPRESSION: Mild left basilar atelectasis. Electronically Signed   By: Inez Catalina M.D.   On: 09/03/2017 11:52    EKG: Independently reviewed.  Assessment/Plan Principal Problem:   Chest pain, rule out acute myocardial infarction Active Problems:   Essential hypertension   Dilated cardiomyopathy (H[facility])   LBBB (left bundle branch block)   OSA (obstructive sleep apnea)   Chronic systolic CHF (congestive heart failure) (H[facility])    1. CP r/o - 1. CP obs pathway 2. Will try NTG then GI cocktail then morphine to get pain under control (currently 4/10) 3. Tele monitor 4. Serial trops 5. NPO after MN 6. Cards / CHF team eval in AM 2. Chronic systolic CHF due to NICM / dilated cardiomyopathy - with LBBB 1. EF worse on latest  echo 2. Dr. Warren Lacy wants CHF team to see patient during stay according to his note today written on the 5/23 echo. 3. No evidence of volume overload (breathing fine, despite laying flat in bed, neg CXR). 4. Continue home meds for now 3. OSA - continue CPAP 4. HTN - continue home BP meds  DVT prophylaxis: Lovenox Code Status: Full Family Communication: No family in room Disposition Plan: Home after admit Consults called: message put in to [facility] for cards / CHF eval in AM Admission status: Admit to inpatient   [place] Pager [phone]  If 7AM-7PM, please contact day team taking care of patient [email or web address] Password [email or web address]  09/03/2017, 8:21 PM

## 2017-09-04 ENCOUNTER — Observation Stay (HOSPITAL_COMMUNITY): Payer: 59

## 2017-09-04 ENCOUNTER — Other Ambulatory Visit: Payer: Self-pay

## 2017-09-04 ENCOUNTER — Encounter (HOSPITAL_COMMUNITY): Payer: Self-pay | Admitting: *Deleted

## 2017-09-04 DIAGNOSIS — I5022 Chronic systolic (congestive) heart failure: Secondary | ICD-10-CM

## 2017-09-04 DIAGNOSIS — R079 Chest pain, unspecified: Secondary | ICD-10-CM

## 2017-09-04 DIAGNOSIS — N179 Acute kidney failure, unspecified: Secondary | ICD-10-CM | POA: Diagnosis not present

## 2017-09-04 DIAGNOSIS — K810 Acute cholecystitis: Secondary | ICD-10-CM | POA: Diagnosis not present

## 2017-09-04 DIAGNOSIS — I10 Essential (primary) hypertension: Secondary | ICD-10-CM

## 2017-09-04 DIAGNOSIS — I447 Left bundle-branch block, unspecified: Secondary | ICD-10-CM | POA: Diagnosis not present

## 2017-09-04 DIAGNOSIS — G4733 Obstructive sleep apnea (adult) (pediatric): Secondary | ICD-10-CM | POA: Diagnosis not present

## 2017-09-04 DIAGNOSIS — I420 Dilated cardiomyopathy: Secondary | ICD-10-CM

## 2017-09-04 DIAGNOSIS — R1011 Right upper quadrant pain: Secondary | ICD-10-CM | POA: Diagnosis not present

## 2017-09-04 LAB — LIPASE, BLOOD: LIPASE: 25 U/L (ref 11–51)

## 2017-09-04 LAB — COMPREHENSIVE METABOLIC PANEL
ALK PHOS: 40 U/L (ref 38–126)
ALT: 29 U/L (ref 17–63)
ANION GAP: 9 (ref 5–15)
AST: 22 U/L (ref 15–41)
Albumin: 4 g/dL (ref 3.5–5.0)
BILIRUBIN TOTAL: 1.2 mg/dL (ref 0.3–1.2)
BUN: 15 mg/dL (ref 6–20)
CALCIUM: 9 mg/dL (ref 8.9–10.3)
CO2: 23 mmol/L (ref 22–32)
Chloride: 99 mmol/L — ABNORMAL LOW (ref 101–111)
Creatinine, Ser: 1.31 mg/dL — ABNORMAL HIGH (ref 0.61–1.24)
GFR calc Af Amer: >60 mL/min (ref >60)
GFR, EST NON AFRICAN AMERICAN: 58 mL/min — AB (ref >60)
Glucose, Bld: 127 mg/dL — ABNORMAL HIGH (ref 65–99)
POTASSIUM: 4.1 mmol/L (ref 3.5–5.1)
Sodium: 131 mmol/L — ABNORMAL LOW (ref 135–145)
TOTAL PROTEIN: 6.9 g/dL (ref 6.5–8.1)

## 2017-09-04 LAB — NM MYOCAR MULTI W/SPECT W/WALL MOTION / EF
CHL CUP RESTING HR STRESS: 85 {beats}/min
Peak HR: 109 {beats}/min

## 2017-09-04 LAB — CBC
HEMATOCRIT: 38.3 % — AB (ref 39.0–52.0)
Hemoglobin: 13.1 g/dL (ref 13.0–17.0)
MCH: 31.3 pg (ref 26.0–34.0)
MCHC: 34.2 g/dL (ref 30.0–36.0)
MCV: 91.4 fL (ref 78.0–100.0)
Platelets: 199 10*3/uL (ref 150–400)
RBC: 4.19 MIL/uL — ABNORMAL LOW (ref 4.22–5.81)
RDW: 12.7 % (ref 11.5–15.5)
WBC: 14.8 10*3/uL — AB (ref 4.0–10.5)

## 2017-09-04 LAB — TROPONIN I
TROPONIN I: 0.03 ng/mL — AB (ref <0.03)
TROPONIN I: 0.04 ng/mL — AB (ref <0.03)

## 2017-09-04 LAB — HIV ANTIBODY (ROUTINE TESTING W REFLEX): HIV Screen 4th Generation wRfx: NONREACTIVE

## 2017-09-04 MED ORDER — REGADENOSON 0.4 MG/5ML IV SOLN
0.4000 mg | Freq: Once | INTRAVENOUS | Status: AC
  Administered 2017-09-04: 0.4 mg via INTRAVENOUS
  Filled 2017-09-04: qty 5

## 2017-09-04 MED ORDER — PANTOPRAZOLE SODIUM 40 MG PO TBEC
40.0000 mg | DELAYED_RELEASE_TABLET | Freq: Two times a day (BID) | ORAL | Status: DC
  Administered 2017-09-04 – 2017-09-13 (×17): 40 mg via ORAL
  Filled 2017-09-04 (×17): qty 1

## 2017-09-04 MED ORDER — REGADENOSON 0.4 MG/5ML IV SOLN
INTRAVENOUS | Status: AC
  Filled 2017-09-04: qty 5

## 2017-09-04 NOTE — Progress Notes (Addendum)
   Nicholas Gray presented for a nuclear stress test today.  No immediate complications.  Stress imaging is pending at this time.  Preliminary EKG findings may be listed in the chart, but the stress test result will not be finalized until perfusion imaging is complete.  Nicholas Pitter, PA-C 09/04/2017, 2:28 PM    Addendum: nuc abnormal. Dr. Radford Gray to review. She plans to likely proceed with OP workup. We discussed since she knows the patient clinically and was going up to 6E, she would go see the patient to review with him up on the floor to establish formal plan.  Nicholas Grantham PA-C

## 2017-09-04 NOTE — Progress Notes (Signed)
[facility] PROGRESS NOTE  JEHU MCCAUSLIN KVQ:[id number] DOB: [birth year] DOA: 09/03/2017  PCP: [facility]  Brief History/Interval Summary: 57 year old Caucasian male with a past medical history of dilated cardiomyopathy with a EF of 25 to 30%, hypertension, obstructive sleep apnea, clean coronaries based on cardiac cath in 2010 presented with chest pain.  Patient was hospitalized for further management.  Reason for Visit: Chest pain  Consultants: Cardiology  Procedures: Stress test is ordered  Antibiotics: None  Subjective/Interval History: Patient complains of chest pain in the central part of his chest ongoing since 10:00 yesterday morning.  Denies any radiation of the pain.  Has had some difficulty breathing.  Some nausea.  ROS: Does have history of acid reflux.  Denies any headaches.  Objective:  Vital Signs  Vitals:   09/04/17 0000 09/04/17 0030 09/04/17 0528 09/04/17 0814  BP: (!) 122/45 (!) 98/52 (!) 120/58 (!) 118/58  Pulse: 78 (!) 58 82   Resp: (!) 27 20 18    Temp:  98 F (36.7 C) 98.4 F (36.9 C)   TempSrc:  Oral    SpO2: 95% 96% 98%   Weight:  119.4 kg (263 lb 3.7 oz)    Height:  5\' 10"  (1.778 m)      Intake/Output Summary (Last 24 hours) at 09/04/2017 1231 Last data filed at 09/04/2017 0700 Gross per 24 hour  Intake -  Output 350 ml  Net -350 ml   Filed Weights   09/03/17 1110 09/04/17 0030  Weight: 126.6 kg (279 lb) 119.4 kg (263 lb 3.7 oz)    General appearance: alert, cooperative, appears stated age and no distress Head: Normocephalic, without obvious abnormality, atraumatic Resp: Normal effort.  Diminished air entry at the bases.  No wheezing rales or rhonchi. Cardio: regular rate and rhythm, S1, S2 normal, no murmur, click, rub or gallop GI: Soft.  Tenderness appreciated in the epigastric area as well as right upper quadrant.  No rebound rigidity or guarding.  No definite masses organomegaly. Extremities:  Minimal edema bilateral lower extremities. Neurologic: No focal neurological deficits.  Lab Results:  Data Reviewed: I have personally reviewed following labs and imaging studies  CBC: Recent Labs  Lab 09/03/17 1115 09/04/17 0902  WBC 8.1 14.8*  HGB 12.7* 13.1  HCT 37.0* 38.3*  MCV 90.0 91.4  PLT 218 643    Basic Metabolic Panel: Recent Labs  Lab 09/03/17 1115 09/04/17 0902  NA 134* 131*  K 4.9 4.1  CL 102 99*  CO2 24 23  GLUCOSE 114* 127*  BUN 20 15  CREATININE 1.46* 1.31*  CALCIUM 9.8 9.0    GFR: Estimated Creatinine Clearance: 79.6 mL/min (A) (by C-G formula based on SCr of 1.31 mg/dL (H)).  Liver Function Tests: Recent Labs  Lab 09/04/17 0902  AST 22  ALT 29  ALKPHOS 40  BILITOT 1.2  PROT 6.9  ALBUMIN 4.0    Recent Labs  Lab 09/04/17 0902  LIPASE 25   Cardiac Enzymes: Recent Labs  Lab 09/03/17 2210 09/04/17 0206 09/04/17 0359  TROPONINI 0.03* 0.03* 0.04*      Radiology Studies: Dg Chest 2 View  Result Date: 09/03/2017 CLINICAL DATA:  Chest pain and shortness of Breath EXAM: CHEST - 2 VIEW COMPARISON:  03/12/2009 FINDINGS: Cardiac shadow is mildly enlarged. Minimal left basilar atelectasis is seen in the lingula. No sizable effusion is noted. No acute bony abnormality is seen. IMPRESSION: Mild left basilar atelectasis. Electronically Signed   By: Linus Mako.D.  On: 09/03/2017 11:52   US Abdomen Complete  Result Date: 09/04/2017 CLINICAL DATA:  Right upper quadrant abdominal pain over the last 2 days. EXAM: ABDOMEN ULTRASOUND COMPLETE COMPARISON:  None. FINDINGS: Gallbladder: No gallstones or wall thickening visualized. No sonographic Murphy sign noted by sonographer. Common bile duct: Diameter: 5 mm common normal Liver: Diffusely echogenic liver suggesting fatty change. No focal lesion or ductal dilatation. Portal vein is patent on color Doppler imaging with normal direction of blood flow towards the liver. IVC: Poorly seen because of  overlying bowel gas. Pancreas: Head and body appear normal. Tail not well seen because of overlying bowel gas. Spleen: Size and appearance within normal limits. Right Kidney: Length: 12.0 cm. 1 cm cyst. Echogenicity within normal limits. No mass or hydronephrosis visualized. Left Kidney: Length: 10.9 cm. Echogenicity within normal limits. No mass or hydronephrosis visualized. Abdominal aorta: Maximal diameter 2.6 cm. Partially obscured by gas. Other findings: No ascites IMPRESSION: Diffusely echogenic liver consistent with steatosis. This can be painful. No gallbladder pathology or ductal dilatation is seen. Electronically Signed   By: Nelson Chimes M.D.   On: 09/04/2017 11:24     Medications:  Scheduled: . allopurinol  100 mg Oral BID  . carvedilol  25 mg Oral BID WC  . enoxaparin (LOVENOX) injection  40 mg Subcutaneous Q24H  . famotidine  20 mg Oral Daily  . lisinopril  20 mg Oral BID  . pantoprazole  40 mg Oral BID AC  . simvastatin  40 mg Oral QHS   Continuous:  [id number], gi cocktail, morphine injection, nitroGLYCERIN, ondansetron (ZOFRAN) IV  Assessment/Plan:    Chest pain in the setting of known chronic systolic CHF due to nonischemic cardiomyopathy Patient with mildly elevated troponins.  With history of LBBB.  Cardiology is following.  Plan is for a stress test to be done during this hospitalization.  From a CHF standpoint he appears to be euvolemic.  Continue home medications with carvedilol ACE inhibitor.  Furosemide appears to be on hold.  Continue to monitor for now.  Creatinine at baseline.  Upper abdominal tenderness/Acute gastritis Patient noted to have epigastric as well as right upper quadrant tenderness on palpation.  Etiology unclear.  LFTs and lipase normal.  Right upper quadrant ultrasound without any gallstones.  Gallbladder looks normal.  Fatty liver was appreciated.  Gastritis could be the reason for his discomfort.  He does report a history of GERD.  He is  noted to be on ranitidine at home.  We will add PPI.  Essential hypertension Blood pressure is reasonably well controlled.  Continue home medications.  Obstructive sleep apnea CPAP.   DVT Prophylaxis: Lovenox    Code Status: Full code Family Communication: Discussed with the patient Disposition Plan: Management as outlined above.    LOS: 0 days   [place] [facility] Pager [phone] 09/04/2017, 12:31 PM  If 7PM-7AM, please contact night-coverage at [email or web address], password [facility]

## 2017-09-04 NOTE — Progress Notes (Signed)
Nuclear stress test Study Result     There was no ST segment deviation noted during stress.  This is an intermediate risk study.  The left ventricular ejection fraction is moderately decreased (30-44%).   1. Fixed medium-sized, moderate intensity mid to apical anteroseptal, mid to apical inferoseptal, and true apex perfusion defect.  Prior infarction involving septum versus LBBB artifact.  No significant ischemia.  2. EF 39% with septal dyskinesis.   Intermediate risk study because of low EF.    Discussed results of nuclear stress test with Dr. [name] who read the study.  Patient had an EF of 40 to 45% by echocardiogram 03/22/2014 then declined to 35 to 40% by 2D echocardiogram 01/04/2016 and most recent 2D echocardiogram done on 08/27/2017 shows worsening LV function with EF 25 to 30%.  Chest pain is atypical and actually is more upper abdominal pain.  He is quite tender to palpation over his right upper quadrant and epigastric area.  Abdominal ultrasound revealed significant steatosis which could be the etiology of his pain.  Lipase is normal.  Cardiac enzymes are essentially normal at 0.03, 0.03, 0.04 despite over 24 hours of constant chest and upper abdomen pain.  Nuclear stress test showed a fixed medium size defect mainly in the septal regions which is likely related to his left bundle branch block.  There was no ischemia noted.  EF 39%.  Does not have any evidence of volume overload on exam today.  Will pursue further outpatient work-up.  We will refer to advanced heart failure clinic with Dr. [name] to consider right and left heart catheterization.

## 2017-09-04 NOTE — Consult Note (Signed)
Cardiology Consultation:   Patient ID: Nicholas Gray; [id number]; 04-05-1961   Admit date: 09/03/2017 Date of Consult: 09/04/2017  Primary Care Provider: [facility] Primary Cardiologist: Minus Breeding, MD   Patient Profile:   Nicholas Gray is a 57 y.o. male with a hx of cardiomyopathy with EF recently down to 25-30%, normal coronary arteries by cath in 2010, GERD, HLD, HTN, OSA on CPAP and LBBB who is being seen today for the evaluation of chest pain at the request of Dr. Massie Kluver.  History of Present Illness:   Nicholas Gray is followed in our office by Dr. Percival Spanish for cardiomyopathy.  He has had an EF as low as 30-40% in 2010, 40% in 2015, 40% in 2017 and most recent echocardiogram on 08/27/2017 shows a significant reduction with EF 25-30%.  He also has grade 1 diastolic dysfunction, mild MR mildly dilated left atrium and right ventricles, PA peak pressure 34 mmHg, mild TR with mild pulmonary hypertension.   We have been asked to evaluate the patient for complaints of chest pain. Nicholas Gray tells me that he developed central dull chest pressure about 10:00 yesterday morning.  This is been nonradiating and constant since that time and is still present.  He had some mild shortness of breath, nausea and lightheadedness throughout the day yesterday but none today.  He has had this before but it was not as persistent.  He is still working 6 days/week as a Electrical engineer and he has had no exertional chest discomfort or dyspnea.  He does not exercise.  He denies orthopnea, PND or edema.  He has occasional brief palpitations which have been long-term.  No increase in palpitations during his discomfort yesterday.  He denies any injury to his chest and his discomfort is not reproducible with palpation.  He does have increased discomfort with deep breathing leaning forward on exam.  He is using his CPAP consistently.  He is compliant with all of his medications.  Troponins have  been very mildly elevated at 0.03, 0.04, flat trend.  Chest x-ray shows only mild left basilar atelectasis.  Labs indicate renal impairment with serum creatinine 1.46.  EKG shows known left bundle branch block.   Past Medical History:  Diagnosis Date  . Arthritis    gout  . Cardiomyopathy    EF 30-40% (Normal coronaries cath 2010).  Echo 2015 EF 40%  . CHF (congestive heart failure) ([place])   . Colonic polyp   . Diverticulosis of colon   . GERD (gastroesophageal reflux disease)   . History of chicken pox   . HLD (hyperlipidemia)    recent  . HTN (hypertension)    recent  . Sleep apnea    does use CPAP.      Past Surgical History:  Procedure Laterality Date  . CARDIAC CATHETERIZATION  2010  . TONSILLECTOMY AND ADENOIDECTOMY       Home Medications:  Prior to Admission medications   Medication Sig Start Date End Date Taking? Authorizing Provider  allopurinol (ZYLOPRIM) 100 MG tablet Take 100 mg by mouth 2 (two) times daily.    Yes [provider]  carvedilol (COREG) 25 MG tablet TAKE (1) TABLET BY MOUTH TWICE DAILY WITH A MEAL. 01/09/17  Yes Minus Breeding, MD  furosemide (LASIX) 20 MG tablet Take 20 mg by mouth daily as needed (swelling).   Yes [provider]  lisinopril (PRINIVIL,ZESTRIL) 10 MG tablet TAKE 2 TABLETS BY MOUTH TWICE DAILY. 01/09/17  Yes Minus Breeding, MD  ranitidine (ZANTAC) 150 MG tablet Take 150 mg by mouth daily.   Yes [provider]  simvastatin (ZOCOR) 40 MG tablet Take 40 mg by mouth at bedtime.   Yes [provider]    Inpatient Medications: Scheduled Meds: . allopurinol  100 mg Oral BID  . carvedilol  25 mg Oral BID WC  . enoxaparin (LOVENOX) injection  40 mg Subcutaneous Q24H  . famotidine  20 mg Oral Daily  . lisinopril  20 mg Oral BID  . simvastatin  40 mg Oral QHS   Continuous Infusions:  PRN Meds: acetaminophen, gi cocktail, morphine injection, nitroGLYCERIN, ondansetron (ZOFRAN) IV  Allergies:   No  Known Allergies  Social History:   Social History   Socioeconomic History  . Marital status: Married    Spouse name: Not on file  . Number of children: 1  . Years of education: Not on file  . Highest education level: Not on file  Occupational History  . Not on file  Social Needs  . Financial resource strain: Not on file  . Food insecurity:    Worry: Not on file    Inability: Not on file  . Transportation needs:    Medical: Not on file    Non-medical: Not on file  Tobacco Use  . Smoking status: Former Smoker    Packs/day: 1.50    Years: 3.00    Pack years: 4.50    Last attempt to quit: 04/07/1980    Years since quitting: 37.4  . Smokeless tobacco: Never Used  Substance and Sexual Activity  . Alcohol use: No    Alcohol/week: 0.0 oz  . Drug use: No    Comment: quit in 1999, marijuana  . Sexual activity: Not on file  Lifestyle  . Physical activity:    Days per week: Not on file    Minutes per session: Not on file  . Stress: Not on file  Relationships  . Social connections:    Talks on phone: Not on file    Gets together: Not on file    Attends religious service: Not on file    Active member of club or organization: Not on file    Attends meetings of clubs or organizations: Not on file    Relationship status: Not on file  . Intimate partner violence:    Fear of current or ex partner: Not on file    Emotionally abused: Not on file    Physically abused: Not on file    Forced sexual activity: Not on file  Other Topics Concern  . Not on file  Social History Narrative  . Not on file    Family History:    Family History  Problem Relation Age of Onset  . Arthritis Unknown        family history  . Diabetes Unknown        family history  . Hyperlipidemia Unknown        family history  . Lung cancer Unknown        family history  . Stroke Unknown        family history     ROS:  Please see the history of present illness.   All other ROS reviewed and  negative.     Physical Exam/Data:   Vitals:   09/03/17 2300 09/04/17 0000 09/04/17 0030 09/04/17 0528  BP: (!) 133/50 (!) 122/45 (!) 98/52 (!) 120/58  Pulse: 74 78 (!) 58 82  Resp: (!) 25 (!) 27  20 18  Temp:   98 F (36.7 C) 98.4 F (36.9 C)  TempSrc:   Oral   SpO2: 96% 95% 96% 98%  Weight:   263 lb 3.7 oz (119.4 kg)   Height:   5\' 10"  (1.778 m)     Intake/Output Summary (Last 24 hours) at 09/04/2017 0746 Last data filed at 09/04/2017 0700 Gross per 24 hour  Intake -  Output 350 ml  Net -350 ml   Filed Weights   09/03/17 1110 09/04/17 0030  Weight: 279 lb (126.6 kg) 263 lb 3.7 oz (119.4 kg)   Body mass index is 37.77 kg/m.  General:  Well nourished, well developed, in no acute distress HEENT: normal Lymph: no adenopathy Neck: no JVD Endocrine:  No thryomegaly Vascular: No carotid bruits; FA pulses 2+ bilaterally without bruits  Cardiac:  normal S1, S2; RRR; no murmur sinus rhythm 70s to 80s Lungs:  clear to auscultation bilaterally, no wheezing, rhonchi or rales  Abd: soft, nontender, no hepatomegaly  Ext: no edema Musculoskeletal:  No deformities, BUE and BLE strength normal and equal Skin: warm and dry  Neuro:  CNs 2-12 intact, no focal abnormalities noted Psych:  Normal affect   EKG:  The EKG was personally reviewed and demonstrates: Sinus bradycardia at 57 bpm with left bundle branch block Telemetry:  Telemetry was personally reviewed and demonstrates: Sinus rhythm 70s-80s  Relevant CV Studies:  Echocardiogram 08/27/2017 Study Conclusions  - Left ventricle: The cavity size was mildly dilated. Wall   thickness was normal. Systolic function was severely reduced. The   estimated ejection fraction was in the range of 25% to 30%.   Diffuse hypokinesis. Doppler parameters are consistent with   abnormal left ventricular relaxation (grade 1 diastolic   dysfunction). - Mitral valve: There was mild regurgitation. - Left atrium: The atrium was mildly dilated. -  Right ventricle: The cavity size was mildly dilated. - Pulmonary arteries: Systolic pressure was mildly increased. PA   peak pressure: 34 mm Hg (S).  Impressions:  - Severe global reduction in LV systolic function; mild diastolic   dysfunction; mild LVE; mild MR; mild LAE and RVE; mild TR with   mild pulmonary hypertension.  Echo  01/04/2016: LVEF 35-40% Echo 03/22/2014: LVEF 40-45% Echo 01/28/2011: LVEF 30%  Laboratory Data:  Chemistry Recent Labs  Lab 09/03/17 1115  NA 134*  K 4.9  CL 102  CO2 24  GLUCOSE 114*  BUN 20  CREATININE 1.46*  CALCIUM 9.8  GFRNONAA 51*  GFRAA 59*  ANIONGAP 8    No results for input(s): PROT, ALBUMIN, AST, ALT, ALKPHOS, BILITOT in the last 168 hours. Hematology Recent Labs  Lab 09/03/17 1115  WBC 8.1  RBC 4.11*  HGB 12.7*  HCT 37.0*  MCV 90.0  MCH 30.9  MCHC 34.3  RDW 12.5  PLT 218   Cardiac Enzymes Recent Labs  Lab 09/03/17 2210 09/04/17 0206 09/04/17 0359  TROPONINI 0.03* 0.03* 0.04*    Recent Labs  Lab 09/03/17 1134 09/03/17 1837  TROPIPOC 0.01 0.01    BNP Recent Labs  Lab 09/03/17 1115  BNP 7.1    DDimer No results for input(s): DDIMER in the last 168 hours.  Radiology/Studies:  Dg Chest 2 View  Result Date: 09/03/2017 CLINICAL DATA:  Chest pain and shortness of Breath EXAM: CHEST - 2 VIEW COMPARISON:  03/12/2009 FINDINGS: Cardiac shadow is mildly enlarged. Minimal left basilar atelectasis is seen in the lingula. No sizable effusion is noted. No acute bony abnormality is  seen. IMPRESSION: Mild left basilar atelectasis. Electronically Signed   By: Inez Catalina M.D.   On: 09/03/2017 11:52    Assessment and Plan:   Chest pain, mostly atypical -Patient developed central chest pressure yesterday morning and has been persistent and constant since then.  Mild shortness of breath, nausea and lightheadedness throughout the day yesterday but none today. -Troponins are only very mildly elevated at 0.03, 0.04 and flat  trend -Chest x-ray shows only mild left basilar atelectasis -Patient with known cardiomyopathy with recently revealed decrease in EF to 25-30%.  Patient without heart failure type symptoms.  Normal coronary arteries by cath in 2010.  No recent ischemic testing. -Patient works 6 days/week as a Furniture conservator/restorer and has had no recent exertional chest discomfort or dyspnea. -EKG is with left bundle branch block unchanged from previous -No objective evidence of myocardial ischemia -We will discuss further evaluation with Dr. Radford Pax  Cardiomyopathy -History of cardiomyopathy with EF being around 40% in the past, recent echocardiogram on 08/27/2017 shows significant reduction to EF 25-30%. -Currently managed with carvedilol 25 mg bid, lasix 20 mg prn, lisinopril 20 mg bid -BNP is 7.1  Hypertension -Being managed in the context of treating his CHF with carvedilol and lisinopril and Lasix as needed  Hyperlipidemia -On simvastatin 40 mg  Obesity  Obstructive sleep apnea - Followed by Dr. Claiborne Billings in our office.  Using CPAP  Acute renal insufficiency -Serum creatinine 1.46, baseline appears to be about 1.3  For questions or updates, please contact [place] Please consult [email or web address] for contact info under Cardiology/STEMI.   Signed, Daune Perch, NP  09/04/2017 7:46 AM

## 2017-09-04 NOTE — Plan of Care (Signed)
  Problem: Clinical Measurements: Goal: Will remain free from infection Outcome: Progressing Note:  No s/s of infection noted. Goal: Respiratory complications will improve Outcome: Progressing Note:  No s/s of respiratory complications.   

## 2017-09-04 NOTE — ED Notes (Addendum)
Paged MD about active chest. Advised he was aware and was not able to make him chest pain free after 12 hours of pain.

## 2017-09-05 ENCOUNTER — Observation Stay (HOSPITAL_COMMUNITY): Payer: 59

## 2017-09-05 DIAGNOSIS — K760 Fatty (change of) liver, not elsewhere classified: Secondary | ICD-10-CM | POA: Diagnosis not present

## 2017-09-05 DIAGNOSIS — I10 Essential (primary) hypertension: Secondary | ICD-10-CM | POA: Diagnosis not present

## 2017-09-05 DIAGNOSIS — R1011 Right upper quadrant pain: Secondary | ICD-10-CM | POA: Diagnosis not present

## 2017-09-05 DIAGNOSIS — I5022 Chronic systolic (congestive) heart failure: Secondary | ICD-10-CM | POA: Diagnosis not present

## 2017-09-05 DIAGNOSIS — R079 Chest pain, unspecified: Secondary | ICD-10-CM | POA: Diagnosis not present

## 2017-09-05 LAB — CBC
HEMATOCRIT: 34.1 % — AB (ref 39.0–52.0)
Hemoglobin: 11.5 g/dL — ABNORMAL LOW (ref 13.0–17.0)
MCH: 30.6 pg (ref 26.0–34.0)
MCHC: 33.7 g/dL (ref 30.0–36.0)
MCV: 90.7 fL (ref 78.0–100.0)
Platelets: 161 10*3/uL (ref 150–400)
RBC: 3.76 MIL/uL — ABNORMAL LOW (ref 4.22–5.81)
RDW: 12.8 % (ref 11.5–15.5)
WBC: 15.2 10*3/uL — AB (ref 4.0–10.5)

## 2017-09-05 LAB — BASIC METABOLIC PANEL
ANION GAP: 11 (ref 5–15)
BUN: 19 mg/dL (ref 6–20)
CALCIUM: 8.7 mg/dL — AB (ref 8.9–10.3)
CO2: 23 mmol/L (ref 22–32)
Chloride: 98 mmol/L — ABNORMAL LOW (ref 101–111)
Creatinine, Ser: 1.63 mg/dL — ABNORMAL HIGH (ref 0.61–1.24)
GFR calc Af Amer: 52 mL/min — ABNORMAL LOW (ref >60)
GFR calc non Af Amer: 45 mL/min — ABNORMAL LOW (ref >60)
Glucose, Bld: 111 mg/dL — ABNORMAL HIGH (ref 65–99)
Potassium: 4.1 mmol/L (ref 3.5–5.1)
Sodium: 132 mmol/L — ABNORMAL LOW (ref 135–145)

## 2017-09-05 MED ORDER — SODIUM CHLORIDE 0.45 % IV SOLN
INTRAVENOUS | Status: AC
  Administered 2017-09-05: 21:00:00 via INTRAVENOUS

## 2017-09-05 MED ORDER — PANTOPRAZOLE SODIUM 40 MG PO TBEC: 40.0000 mg | DELAYED_RELEASE_TABLET | Freq: Two times a day (BID) | ORAL | 0 refills | Status: DC

## 2017-09-05 MED ORDER — PIPERACILLIN-TAZOBACTAM 3.375 G IVPB
3.3750 g | Freq: Three times a day (TID) | INTRAVENOUS | Status: AC
  Administered 2017-09-05 – 2017-09-08 (×10): 3.375 g via INTRAVENOUS
  Filled 2017-09-05 (×10): qty 50

## 2017-09-05 NOTE — Consult Note (Addendum)
Reason for Consult:abdominal pain Referring Physician: Dr. Charlann Noss is an 57 y.o. male.  HPI:  Pt is a 57 yo M admitted for chest pain/MI r/o.  He has been evaluated by cardiology and has no evidence of active ischemic disease.  However, his abdomen was noted to be tender by Dr. Maryland Pink on exam.  LFTs were checked and were normal.  U/S was also negative for stones or pericholecystic fluid/gallbladder wall thickening.  However, he continued to have discomfort today, so CT was done.  This was concerning for possible stone lodged in gallbladder neck and possible cholecystitis.  We are asked to evaluate the patient.  He has not been told his has gallstones in the past.  He denies n/v.  He denies jaundice/fever/chills.     He has had similar epigastric discomfort that was much less severe that he thought was heartburn.  He has no family history of gallbladder disease of which he is aware.    Past Medical History:  Diagnosis Date  . Arthritis    gout  . Cardiomyopathy    EF 30-40% (Normal coronaries cath 2010).  Echo 2015 EF 40%  . CHF (congestive heart failure) ([place])   . Colonic polyp   . Diverticulosis of colon   . GERD (gastroesophageal reflux disease)   . History of chicken pox   . HLD (hyperlipidemia)    recent  . HTN (hypertension)    recent  . Sleep apnea    does use CPAP.      Past Surgical History:  Procedure Laterality Date  . CARDIAC CATHETERIZATION  2010  . TONSILLECTOMY AND ADENOIDECTOMY      Family History  Problem Relation Age of Onset  . Arthritis Unknown        family history  . Diabetes Unknown        family history  . Hyperlipidemia Unknown        family history  . Lung cancer Unknown        family history  . Stroke Unknown        family history    Social History:  reports that he quit smoking about 37 years ago. He has a 4.50 pack-year smoking history. He has never used smokeless tobacco. He reports that he does not drink alcohol or  use drugs.  Allergies: No Known Allergies  Medications:  Current Meds  Medication Sig  . allopurinol (ZYLOPRIM) 100 MG tablet Take 100 mg by mouth 2 (two) times daily.   . carvedilol (COREG) 25 MG tablet TAKE (1) TABLET BY MOUTH TWICE DAILY WITH A MEAL.  . furosemide (LASIX) 20 MG tablet Take 20 mg by mouth daily as needed (swelling).  Marland Kitchen lisinopril (PRINIVIL,ZESTRIL) 10 MG tablet TAKE 2 TABLETS BY MOUTH TWICE DAILY.  . ranitidine (ZANTAC) 150 MG tablet Take 150 mg by mouth daily.  . simvastatin (ZOCOR) 40 MG tablet Take 40 mg by mouth at bedtime.     Results for orders placed or performed during the hospital encounter of 09/03/17 (from the past 48 hour(s))  HIV antibody (Routine Testing)     Status: None   Collection Time: 09/03/17 10:02 PM  Result Value Ref Range   HIV Screen 4th Generation wRfx Non Reactive Non Reactive    Comment: (NOTE) Performed At: [facility] [address] [place], [place] [id number] Rush Farmer MD JS:[id number] Performed at [facility], [place] [address], [place], [place] [address]   Troponin I-serum (0, 3, 6  hours)     Status: Abnormal   Collection Time: 09/03/17 10:10 PM  Result Value Ref Range   Troponin I 0.03 (HH) <0.03 ng/mL    Comment: CRITICAL RESULT CALLED TO, READ BACK BY AND VERIFIED WITH: SANDERS,A RN 09/03/2017 2319 JORDANS Performed at [facility], [place] [address], [place], [place] [address]   Troponin I-serum (0, 3, 6 hours)     Status: Abnormal   Collection Time: 09/04/17  2:06 AM  Result Value Ref Range   Troponin I 0.03 (HH) <0.03 ng/mL    Comment: CRITICAL VALUE NOTED.  VALUE IS CONSISTENT WITH PREVIOUSLY REPORTED AND CALLED VALUE. Performed at [facility], [place] [address] [place], [place] [address]   Troponin I-serum (0, 3, 6 hours)     Status: Abnormal   Collection Time: 09/04/17  3:59 AM  Result Value Ref Range   Troponin I 0.04 (HH) <0.03 ng/mL    Comment: CRITICAL VALUE NOTED.  VALUE IS  CONSISTENT WITH PREVIOUSLY REPORTED AND CALLED VALUE. Performed at [facility], [place] [address], [place], [place] [address]   Comprehensive metabolic panel     Status: Abnormal   Collection Time: 09/04/17  9:02 AM  Result Value Ref Range   Sodium 131 (L) 135 - 145 mmol/L   Potassium 4.1 3.5 - 5.1 mmol/L   Chloride 99 (L) 101 - 111 mmol/L   CO2 23 22 - 32 mmol/L   Glucose, Bld 127 (H) 65 - 99 mg/dL   BUN 15 6 - 20 mg/dL   Creatinine, Ser 1.31 (H) 0.61 - 1.24 mg/dL   Calcium 9.0 8.9 - 10.3 mg/dL   Total Protein 6.9 6.5 - 8.1 g/dL   Albumin 4.0 3.5 - 5.0 g/dL   AST 22 15 - 41 U/L   ALT 29 17 - 63 U/L   Alkaline Phosphatase 40 38 - 126 U/L   Total Bilirubin 1.2 0.3 - 1.2 mg/dL   GFR calc non Af Amer 58 (L) >60 mL/min   GFR calc Af Amer >60 >60 mL/min    Comment: (NOTE) The eGFR has been calculated using the CKD EPI equation. This calculation has not been validated in all clinical situations. eGFR's persistently <60 mL/min signify possible Chronic Kidney Disease.    Anion gap 9 5 - 15    Comment: Performed at [facility] [address] [place], [place] [address]  CBC     Status: Abnormal   Collection Time: 09/04/17  9:02 AM  Result Value Ref Range   WBC 14.8 (H) 4.0 - 10.5 K/uL   RBC 4.19 (L) 4.22 - 5.81 MIL/uL   Hemoglobin 13.1 13.0 - 17.0 g/dL   HCT 38.3 (L) 39.0 - 52.0 %   MCV 91.4 78.0 - 100.0 fL   MCH 31.3 26.0 - 34.0 pg   MCHC 34.2 30.0 - 36.0 g/dL   RDW 12.7 11.5 - 15.5 %   Platelets 199 150 - 400 K/uL    Comment: Performed at [facility], [place] [address] [place], [place] [address]  Lipase, blood     Status: None   Collection Time: 09/04/17  9:02 AM  Result Value Ref Range   Lipase 25 11 - 51 U/L    Comment: Performed at [facility] [address], [place], [place] [address]  CBC     Status: Abnormal   Collection Time: 09/05/17  7:46 AM  Result Value Ref Range   WBC 15.2 (  H) 4.0 - 10.5 K/uL   RBC 3.76 (L) 4.22 - 5.81 MIL/uL    Hemoglobin 11.5 (L) 13.0 - 17.0 g/dL   HCT 34.1 (L) 39.0 - 52.0 %   MCV 90.7 78.0 - 100.0 fL   MCH 30.6 26.0 - 34.0 pg   MCHC 33.7 30.0 - 36.0 g/dL   RDW 12.8 11.5 - 15.5 %   Platelets 161 150 - 400 K/uL    Comment: Performed at [facility] [address], [place], [place] [address]  Basic metabolic panel     Status: Abnormal   Collection Time: 09/05/17  7:46 AM  Result Value Ref Range   Sodium 132 (L) 135 - 145 mmol/L   Potassium 4.1 3.5 - 5.1 mmol/L   Chloride 98 (L) 101 - 111 mmol/L   CO2 23 22 - 32 mmol/L   Glucose, Bld 111 (H) 65 - 99 mg/dL   BUN 19 6 - 20 mg/dL   Creatinine, Ser 1.63 (H) 0.61 - 1.24 mg/dL   Calcium 8.7 (L) 8.9 - 10.3 mg/dL   GFR calc non Af Amer 45 (L) >60 mL/min   GFR calc Af Amer 52 (L) >60 mL/min    Comment: (NOTE) The eGFR has been calculated using the CKD EPI equation. This calculation has not been validated in all clinical situations. eGFR's persistently <60 mL/min signify possible Chronic Kidney Disease.    Anion gap 11 5 - 15    Comment: Performed at [facility] [address] [place], [place] [address]    Ct Abdomen Pelvis Wo Contrast  Result Date: 09/05/2017 CLINICAL DATA:  57 year old male with history of abdominal pain. Gastroenteritis or colitis is suspected. EXAM: CT ABDOMEN AND PELVIS WITHOUT CONTRAST TECHNIQUE: Multidetector CT imaging of the abdomen and pelvis was performed following the standard protocol without IV contrast. COMPARISON:  No priors. FINDINGS: Lower chest: Linear areas of scarring or atelectasis are noted in the lower lobes of the lungs bilaterally. Hepatobiliary: Diffuse low attenuation throughout the visualized hepatic parenchyma, indicative of severe hepatic steatosis. No definite cystic or solid hepatic lesions are confidently identified on today's noncontrast CT examination. There is a gallstone in the region of the neck of the gallbladder or cystic duct. The gallbladder is only moderately distended, but the  gallbladder wall appears thickened and there are extensive surrounding inflammatory changes, concerning for an acute cholecystitis. Pancreas: No definite pancreatic mass or peripancreatic fluid or inflammatory changes are noted on today's noncontrast CT examination. Spleen: Unremarkable. Adrenals/Urinary Tract: Unenhanced appearance of the kidneys and bilateral adrenal glands is normal. No hydroureteronephrosis. No nephrolithiasis. Urinary bladder is normal in appearance. Stomach/Bowel: Unenhanced appearance of the stomach is normal. No pathologic dilatation of small bowel or colon. Inflammatory changes are noted adjacent to the second portion of the duodenum, likely related to the acute process in the gallbladder. Numerous colonic diverticulae are noted, without surrounding inflammatory changes to suggest an acute diverticulitis at this time. Inflammatory changes are also noted adjacent to the hepatic flexure of the colon, also likely from the gallbladder. The appendix is not confidently identified and may be surgically absent. Regardless, there are no inflammatory changes noted adjacent to the cecum to suggest the presence of an acute appendicitis at this time. Vascular/Lymphatic: Aortic atherosclerosis. No lymphadenopathy noted in the abdomen or pelvis. Reproductive: Prostate gland and seminal vesicles are unremarkable in appearance. Other: Trace volume of ascites.  No pneumoperitoneum. Musculoskeletal: There are no aggressive appearing lytic or blastic lesions noted in the  visualized portions of the skeleton. IMPRESSION: 1. Findings are highly concerning for an acute cholecystitis. This could be confirmed with right upper quadrant ultrasound. 2. Severe hepatic steatosis. 3. Aortic atherosclerosis. 4. Trace volume of ascites, likely reactive. These results were called by telephone at the time of interpretation on 09/05/2017 at 6:50 pm to Nurse Ray for Dr. Bonnielee Haff, who verbally acknowledged these results.  Aortic Atherosclerosis (ICD10-I70.0). Electronically Signed   By: Vinnie Langton M.D.   On: 09/05/2017 18:51   US Abdomen Complete  Result Date: 09/04/2017 CLINICAL DATA:  Right upper quadrant abdominal pain over the last 2 days. EXAM: ABDOMEN ULTRASOUND COMPLETE COMPARISON:  None. FINDINGS: Gallbladder: No gallstones or wall thickening visualized. No sonographic Murphy sign noted by sonographer. Common bile duct: Diameter: 5 mm common normal Liver: Diffusely echogenic liver suggesting fatty change. No focal lesion or ductal dilatation. Portal vein is patent on color Doppler imaging with normal direction of blood flow towards the liver. IVC: Poorly seen because of overlying bowel gas. Pancreas: Head and body appear normal. Tail not well seen because of overlying bowel gas. Spleen: Size and appearance within normal limits. Right Kidney: Length: 12.0 cm. 1 cm cyst. Echogenicity within normal limits. No mass or hydronephrosis visualized. Left Kidney: Length: 10.9 cm. Echogenicity within normal limits. No mass or hydronephrosis visualized. Abdominal aorta: Maximal diameter 2.6 cm. Partially obscured by gas. Other findings: No ascites IMPRESSION: Diffusely echogenic liver consistent with steatosis. This can be painful. No gallbladder pathology or ductal dilatation is seen. Electronically Signed   By: Nelson Chimes M.D.   On: 09/04/2017 11:24   Nm Myocar Multi W/spect W/wall Motion / Ef  Result Date: 09/04/2017  There was no ST segment deviation noted during stress.  This is an intermediate risk study.  The left ventricular ejection fraction is moderately decreased (30-44%).  1. Fixed medium-sized, moderate intensity mid to apical anteroseptal, mid to apical inferoseptal, and true apex perfusion defect.  Prior infarction involving septum versus LBBB artifact.  No significant ischemia. 2. EF 39% with septal dyskinesis. Intermediate risk study because of low EF.    Review of Systems  Constitutional:  Negative.   HENT: Negative.   Eyes: Negative.   Respiratory: Negative.   Cardiovascular: Negative.   Gastrointestinal: Positive for abdominal pain.  Genitourinary: Negative.   Musculoskeletal: Negative.   Skin: Negative.   Neurological: Negative.   Endo/Heme/Allergies: Negative.   Psychiatric/Behavioral: Negative.      Blood pressure 118/60, pulse 85, temperature 98.3 F (36.8 C), temperature source Oral, resp. rate 20, height 5' 10"  (1.778 m), weight 119.2 kg (262 lb 12.8 oz), SpO2 92 %. Physical Exam  Constitutional: He is oriented to person, place, and time. He appears well-developed and well-nourished.  HENT:  Head: Normocephalic and atraumatic.  Right Ear: External ear normal.  Left Ear: External ear normal.  Eyes: Pupils are equal, round, and reactive to light. Conjunctivae are normal. No scleral icterus.  Neck: Normal range of motion. Neck supple.  Cardiovascular: Normal rate, regular rhythm and intact distal pulses.  Respiratory: Effort normal and breath sounds normal. No respiratory distress.  GI: Soft. He exhibits no distension (RUQ, epigastric) and no mass. There is tenderness. There is no rebound and no guarding.  Musculoskeletal: Normal range of motion.  Neurological: He is alert and oriented to person, place, and time. Coordination normal.  Skin: Skin is warm and dry. No rash noted. He is not diaphoretic. No erythema. No pallor.  Psychiatric: He has a normal mood and  affect. His behavior is normal. Judgment and thought content normal.     Assessment/Plan: RUQ pain, abnormal CT scan Cardiomyopathy, L BBB, prior MI based on imaging Hepatic steatosis  Nuclear stress study without ischemia.   Discrepancy between U/S and CT. Certainly tenderness points to gallbladder disease.   Will recheck LFTs.   Will most likely get HIDA in AM to assist with diagnosis and treatment plan.   If HIDA positive, will need lap chole or perc chole tube.      Stark Klein 09/05/2017, 7:43 PM

## 2017-09-05 NOTE — Plan of Care (Signed)
  Problem: Clinical Measurements: Goal: Ability to maintain clinical measurements within normal limits will improve Outcome: Progressing   Problem: Clinical Measurements: Goal: Cardiovascular complication will be avoided Outcome: Progressing   Problem: Pain Managment: Goal: General experience of comfort will improve Outcome: Progressing   

## 2017-09-05 NOTE — Progress Notes (Signed)
Called by Radiologist regarding pt CT of abdomen results: Highly concerning for acute cholecystitis. ? Possibly surgical consult recommended. Radiologist requesting for Dr. Kevan Rosebush to be updated. Dr. Maryland Pink text paged via [facility] of above. Awaiting for response.

## 2017-09-05 NOTE — Progress Notes (Addendum)
[facility] PROGRESS NOTE  AMONI MORALES VOZ:[id number] DOB: Sep 02, 1960 DOA: 09/03/2017  PCP: [facility]  Brief History/Interval Summary: 57 year old Caucasian male with a past medical history of dilated cardiomyopathy with a EF of 25 to 30%, hypertension, obstructive sleep apnea, clean coronaries based on cardiac cath in 2010 presented with chest pain.  Patient was hospitalized for further management.  Reason for Visit: Chest pain  Consultants: Cardiology  Procedures: Stress test is ordered  Antibiotics: None  Subjective/Interval History: Patient continues to have upper abdominal discomfort. But no nausea or vomiting. Denies any chest pain today.  ROS: No headaches.  Objective:  Vital Signs  Vitals:   09/04/17 1651 09/04/17 2054 09/05/17 0537 09/05/17 0833  BP: 110/60 (!) 107/58 (!) 100/53 (!) 97/50  Pulse: 88  82   Resp: 20  20   Temp: 99.7 F (37.6 C)  99.1 F (37.3 C)   TempSrc: Oral  Oral   SpO2: 97%  93%   Weight:   119.2 kg (262 lb 12.8 oz)   Height:        Intake/Output Summary (Last 24 hours) at 09/05/2017 1350 Last data filed at 09/05/2017 0541 Gross per 24 hour  Intake 480 ml  Output 700 ml  Net -220 ml   Filed Weights   09/03/17 1110 09/04/17 0030 09/05/17 0537  Weight: 126.6 kg (279 lb) 119.4 kg (263 lb 3.7 oz) 119.2 kg (262 lb 12.8 oz)    General appearance: Awake alert. No distress Resp: CTA bilaterally Cardio: s1s2 normal regular GI: Soft. Mildly tender in epig and ruq. No rebound rigidity or guarding. No masses or organomegaly. Extremities: min edema bil LE Neurologic: no focal deficits  Lab Results:  Data Reviewed: I have personally reviewed following labs and imaging studies  CBC: Recent Labs  Lab 09/03/17 1115 09/04/17 0902 09/05/17 0746  WBC 8.1 14.8* 15.2*  HGB 12.7* 13.1 11.5*  HCT 37.0* 38.3* 34.1*  MCV 90.0 91.4 90.7  PLT 218 199 425    Basic Metabolic Panel: Recent Labs  Lab  09/03/17 1115 09/04/17 0902 09/05/17 0746  NA 134* 131* 132*  K 4.9 4.1 4.1  CL 102 99* 98*  CO2 24 23 23   GLUCOSE 114* 127* 111*  BUN 20 15 19   CREATININE 1.46* 1.31* 1.63*  CALCIUM 9.8 9.0 8.7*    GFR: Estimated Creatinine Clearance: 63.9 mL/min (A) (by C-G formula based on SCr of 1.63 mg/dL (H)).  Liver Function Tests: Recent Labs  Lab 09/04/17 0902  AST 22  ALT 29  ALKPHOS 40  BILITOT 1.2  PROT 6.9  ALBUMIN 4.0    Recent Labs  Lab 09/04/17 0902  LIPASE 25   Cardiac Enzymes: Recent Labs  Lab 09/03/17 2210 09/04/17 0206 09/04/17 0359  TROPONINI 0.03* 0.03* 0.04*      Radiology Studies: US Abdomen Complete  Result Date: 09/04/2017 CLINICAL DATA:  Right upper quadrant abdominal pain over the last 2 days. EXAM: ABDOMEN ULTRASOUND COMPLETE COMPARISON:  None. FINDINGS: Gallbladder: No gallstones or wall thickening visualized. No sonographic Murphy sign noted by sonographer. Common bile duct: Diameter: 5 mm common normal Liver: Diffusely echogenic liver suggesting fatty change. No focal lesion or ductal dilatation. Portal vein is patent on color Doppler imaging with normal direction of blood flow towards the liver. IVC: Poorly seen because of overlying bowel gas. Pancreas: Head and body appear normal. Tail not well seen because of overlying bowel gas. Spleen: Size and appearance within normal limits. Right Kidney: Length: 12.0  cm. 1 cm cyst. Echogenicity within normal limits. No mass or hydronephrosis visualized. Left Kidney: Length: 10.9 cm. Echogenicity within normal limits. No mass or hydronephrosis visualized. Abdominal aorta: Maximal diameter 2.6 cm. Partially obscured by gas. Other findings: No ascites IMPRESSION: Diffusely echogenic liver consistent with steatosis. This can be painful. No gallbladder pathology or ductal dilatation is seen. Electronically Signed   By: Nelson Chimes M.D.   On: 09/04/2017 11:24   Nm Myocar Multi W/spect W/wall Motion / Ef  Result  Date: 09/04/2017  There was no ST segment deviation noted during stress.  This is an intermediate risk study.  The left ventricular ejection fraction is moderately decreased (30-44%).  1. Fixed medium-sized, moderate intensity mid to apical anteroseptal, mid to apical inferoseptal, and true apex perfusion defect.  Prior infarction involving septum versus LBBB artifact.  No significant ischemia. 2. EF 39% with septal dyskinesis. Intermediate risk study because of low EF.     Medications:  Scheduled: . allopurinol  100 mg Oral BID  . carvedilol  25 mg Oral BID WC  . enoxaparin (LOVENOX) injection  40 mg Subcutaneous Q24H  . famotidine  20 mg Oral Daily  . lisinopril  20 mg Oral BID  . pantoprazole  40 mg Oral BID AC  . simvastatin  40 mg Oral QHS   Continuous:  ERX:[id number], gi cocktail, morphine injection, nitroGLYCERIN, ondansetron (ZOFRAN) IV  Assessment/Plan:    Chest pain in the setting of known chronic systolic CHF due to nonischemic cardiomyopathy Patient with mildly elevated troponins.  With history of LBBB. Seen by cardiology. Underwent stress test which was intermediate risk. He will see HF clinic next week for further workup.   Elevated creatinine Likely has some element of CKD. Making urine. Outpatient monitoring.  Upper abdominal tenderness/Acute gastritis Patient noted to have epigastric as well as right upper quadrant tenderness on palpation.  Etiology unclear.  LFTs and lipase normal.  Right upper quadrant ultrasound without any gallstones.  Gallbladder looks normal.  Fatty liver was appreciated.  Gastritis could be the reason for his discomfort.  He does report a history of GERD.  He is noted to be on ranitidine at home.  PPI was added. Continues to have symptoms. Will get CT to rule out other etiologies.  Essential hypertension Blood pressure is reasonably well controlled.  Continue home medications.  Obstructive sleep apnea CPAP.  ADDENDUM Abnormal CT.  Discussed with Dr. Barry Dienes. HIDA recommended.    DVT Prophylaxis: Lovenox    Code Status: Full code Family Communication: Discussed with the patient Disposition Plan: Management as outlined above. Hopefully discharge if CT is unremarkable.    LOS: 0 days   [facility] Pager [phone] 09/05/2017, 1:50 PM  If 7PM-7AM, please contact night-coverage at [email or web address], password [facility]

## 2017-09-05 NOTE — Plan of Care (Signed)
  Problem: Clinical Measurements: Goal: Ability to maintain clinical measurements within normal limits will improve Outcome: Progressing Note:  VSS.  NAD noted. Goal: Will remain free from infection Outcome: Progressing Note:  No s/s of infection noted.

## 2017-09-06 ENCOUNTER — Observation Stay (HOSPITAL_COMMUNITY): Payer: 59

## 2017-09-06 DIAGNOSIS — R1011 Right upper quadrant pain: Secondary | ICD-10-CM | POA: Diagnosis not present

## 2017-09-06 DIAGNOSIS — I5022 Chronic systolic (congestive) heart failure: Secondary | ICD-10-CM | POA: Diagnosis not present

## 2017-09-06 DIAGNOSIS — N179 Acute kidney failure, unspecified: Secondary | ICD-10-CM | POA: Diagnosis not present

## 2017-09-06 DIAGNOSIS — I10 Essential (primary) hypertension: Secondary | ICD-10-CM | POA: Diagnosis not present

## 2017-09-06 DIAGNOSIS — K810 Acute cholecystitis: Secondary | ICD-10-CM | POA: Diagnosis not present

## 2017-09-06 DIAGNOSIS — R079 Chest pain, unspecified: Secondary | ICD-10-CM | POA: Diagnosis not present

## 2017-09-06 LAB — COMPREHENSIVE METABOLIC PANEL WITH GFR
ALT: 35 U/L (ref 17–63)
AST: 32 U/L (ref 15–41)
Albumin: 3.2 g/dL — ABNORMAL LOW (ref 3.5–5.0)
Alkaline Phosphatase: 68 U/L (ref 38–126)
Anion gap: 10 (ref 5–15)
BUN: 21 mg/dL — ABNORMAL HIGH (ref 6–20)
CO2: 24 mmol/L (ref 22–32)
Calcium: 8.4 mg/dL — ABNORMAL LOW (ref 8.9–10.3)
Chloride: 97 mmol/L — ABNORMAL LOW (ref 101–111)
Creatinine, Ser: 1.72 mg/dL — ABNORMAL HIGH (ref 0.61–1.24)
GFR calc Af Amer: 49 mL/min — ABNORMAL LOW
GFR calc non Af Amer: 42 mL/min — ABNORMAL LOW
Glucose, Bld: 115 mg/dL — ABNORMAL HIGH (ref 65–99)
Potassium: 4 mmol/L (ref 3.5–5.1)
Sodium: 131 mmol/L — ABNORMAL LOW (ref 135–145)
Total Bilirubin: 1.2 mg/dL (ref 0.3–1.2)
Total Protein: 6.3 g/dL — ABNORMAL LOW (ref 6.5–8.1)

## 2017-09-06 LAB — CBC
HCT: 32.2 % — ABNORMAL LOW (ref 39.0–52.0)
Hemoglobin: 10.9 g/dL — ABNORMAL LOW (ref 13.0–17.0)
MCH: 31.1 pg (ref 26.0–34.0)
MCHC: 33.9 g/dL (ref 30.0–36.0)
MCV: 91.7 fL (ref 78.0–100.0)
PLATELETS: 148 10*3/uL — AB (ref 150–400)
RBC: 3.51 MIL/uL — ABNORMAL LOW (ref 4.22–5.81)
RDW: 12.8 % (ref 11.5–15.5)
WBC: 12.7 10*3/uL — AB (ref 4.0–10.5)

## 2017-09-06 MED ORDER — MORPHINE SULFATE (PF) 4 MG/ML IV SOLN
INTRAVENOUS | Status: AC
  Administered 2017-09-07: 2 mg via INTRAVENOUS
  Filled 2017-09-06: qty 1

## 2017-09-06 MED ORDER — TECHNETIUM TC 99M MEBROFENIN IV KIT
5.0000 | PACK | Freq: Once | INTRAVENOUS | Status: AC | PRN
  Administered 2017-09-06: 5 via INTRAVENOUS

## 2017-09-06 MED ORDER — MORPHINE SULFATE (PF) 4 MG/ML IV SOLN
3.0000 mg | Freq: Once | INTRAVENOUS | Status: AC
  Administered 2017-09-06: 3 mg via INTRAVENOUS

## 2017-09-06 NOTE — Progress Notes (Signed)
  Progress Note: General Surgery Service   Assessment/Plan: Patient Active Problem List   Diagnosis Date Noted  . Chronic systolic CHF (congestive heart failure) ([place]) 09/03/2017  . Chest pain, rule out acute myocardial infarction 09/03/2017  . ACTINIC KERATOSIS 11/01/2009  . Dilated cardiomyopathy ([place]) 06/12/2009  . OSA (obstructive sleep apnea) 05/02/2009  . CHF 03/05/2009  . Morbid obesity ([place]) 01/31/2009  . LBBB (left bundle branch block) 12/25/2008  . HYPERLIPIDEMIA 11/22/2008  . COLONIC POLYPS 10/19/2008  . Essential hypertension 10/19/2008  . GERD 10/19/2008  . DIVERTICULITIS, COLON 10/19/2008   Concern for cholecystitis, EF 30%, cardiology recommending heart cath -f/u HIDA scan -continue antibiotics   LOS: 0 days  Chief Complaint/Subjective: Continued tenderness right upper quadrant, no nausea or vomiting  Objective: Vital signs in last 24 hours: Temp:  [98.3 F (36.8 C)-98.8 F (37.1 C)] 98.8 F (37.1 C) (06/01 2036) Pulse Rate:  [82-85] 82 (06/01 2036) Resp:  [20] 20 (06/01 2036) BP: (109-118)/(57-60) 109/57 (06/01 2036) SpO2:  [92 %-93 %] 93 % (06/01 2036) Last BM Date: 09/05/17  Intake/Output from previous day: 06/01 0701 - 06/02 0700 In: 823.8 [P.O.:360; I.V.:413.8; IV Piggyback:50] Out: 800 [Urine:800] Intake/Output this shift: Total I/O In: 240 [P.O.:240] Out: -    Abd: soft, TTP RUQ, no guarding  Extremities: no edema  Neuro: AOx4  Lab Results: CBC  Recent Labs    09/05/17 0746 09/06/17 0532  WBC 15.2* 12.7*  HGB 11.5* 10.9*  HCT 34.1* 32.2*  PLT 161 148*   BMET Recent Labs    09/05/17 0746 09/06/17 0532  NA 132* 131*  K 4.1 4.0  CL 98* 97*  CO2 23 24  GLUCOSE 111* 115*  BUN 19 21*  CREATININE 1.63* 1.72*  CALCIUM 8.7* 8.4*   PT/INR No results for input(s): LABPROT, INR in the last 72 hours. ABG No results for input(s): PHART, HCO3 in the last 72 hours.  Invalid input(s): PCO2,  PO2  Studies/Results:  Anti-infectives: Anti-infectives (From admission, onward)   Start     Dose/Rate Route Frequency Ordered Stop   09/05/17 2000  piperacillin-tazobactam (ZOSYN) IVPB 3.375 g     3.375 g 12.5 mL/hr over 240 Minutes Intravenous Every 8 hours 09/05/17 1908        Medications: Scheduled Meds: . allopurinol  100 mg Oral BID  . carvedilol  25 mg Oral BID WC  . enoxaparin (LOVENOX) injection  40 mg Subcutaneous Q24H  . famotidine  20 mg Oral Daily  . pantoprazole  40 mg Oral BID AC  . simvastatin  40 mg Oral QHS   Continuous Infusions: . piperacillin-tazobactam (ZOSYN)  IV 3.375 g (09/06/17 0421)   PRN Meds:.acetaminophen, gi cocktail, morphine injection, nitroGLYCERIN, ondansetron (ZOFRAN) IV  Mickeal Skinner, MD Pg# [phone] [facility]

## 2017-09-06 NOTE — Progress Notes (Signed)
     Pt may need cholecystectomy. If so, will need heart cath first.   See 06/03, f/u on HIDA scan and determine plan of care at that time.   Rosaria Ferries, PA-C 09/06/2017 8:42 AM Beeper [phone]

## 2017-09-06 NOTE — Progress Notes (Signed)
[facility] PROGRESS NOTE  TORRIE LAFAVOR DZH:[id number] DOB: 05/12/1960 DOA: 09/03/2017  PCP: [facility]  Brief History/Interval Summary: 57 year old Caucasian male with a past medical history of dilated cardiomyopathy with a EF of 25 to 30%, hypertension, obstructive sleep apnea, clean coronaries based on cardiac cath in 2010 presented with chest pain.  Patient was hospitalized for further management.  Reason for Visit: Chest pain  Consultants: Cardiology  Procedures:  Stress test  There was no ST segment deviation noted during stress.  This is an intermediate risk study.  The left ventricular ejection fraction is moderately decreased (30-44%).   1. Fixed medium-sized, moderate intensity mid to apical anteroseptal, mid to apical inferoseptal, and true apex perfusion defect.  Prior infarction involving septum versus LBBB artifact.  No significant ischemia.  2. EF 39% with septal dyskinesis.   Intermediate risk study because of low EF.   Antibiotics: Zosyn 6/1  Subjective/Interval History: Patient continues to complain of right upper quadrant abdominal pain and tenderness.  Some nausea but no vomiting.  No chest pain.  No shortness of breath.  ROS: No headaches.  Objective:  Vital Signs  Vitals:   09/05/17 0537 09/05/17 0833 09/05/17 1638 09/05/17 2036  BP: (!) 100/53 (!) 97/50 118/60 (!) 109/57  Pulse: 82  85 82  Resp: 20  20 20   Temp: 99.1 F (37.3 C)  98.3 F (36.8 C) 98.8 F (37.1 C)  TempSrc: Oral  Oral Oral  SpO2: 93%  92% 93%  Weight: 119.2 kg (262 lb 12.8 oz)     Height:        Intake/Output Summary (Last 24 hours) at 09/06/2017 0855 Last data filed at 09/06/2017 0259 Gross per 24 hour  Intake 823.75 ml  Output 800 ml  Net 23.75 ml   Filed Weights   09/03/17 1110 09/04/17 0030 09/05/17 0537  Weight: 126.6 kg (279 lb) 119.4 kg (263 lb 3.7 oz) 119.2 kg (262 lb 12.8 oz)    General appearance: Awake alert.  In no  distress. Resp: Clear to auscultation bilaterally.  No wheezing rales or rhonchi. Cardio: S1-S2 is normal regular.  No S3-S4 GI: Abdomen remains soft.  Remains tender in the right upper quadrant and epigastrium.  No rebound rigidity or guarding.  No masses organomegaly Neurologic: No focal deficits.  Lab Results:  Data Reviewed: I have personally reviewed following labs and imaging studies  CBC: Recent Labs  Lab 09/03/17 1115 09/04/17 0902 09/05/17 0746 09/06/17 0532  WBC 8.1 14.8* 15.2* 12.7*  HGB 12.7* 13.1 11.5* 10.9*  HCT 37.0* 38.3* 34.1* 32.2*  MCV 90.0 91.4 90.7 91.7  PLT 218 199 161 148*    Basic Metabolic Panel: Recent Labs  Lab 09/03/17 1115 09/04/17 0902 09/05/17 0746 09/06/17 0532  NA 134* 131* 132* 131*  K 4.9 4.1 4.1 4.0  CL 102 99* 98* 97*  CO2 24 23 23 24   GLUCOSE 114* 127* 111* 115*  BUN 20 15 19  21*  CREATININE 1.46* 1.31* 1.63* 1.72*  CALCIUM 9.8 9.0 8.7* 8.4*    GFR: Estimated Creatinine Clearance: 60.6 mL/min (A) (by C-G formula based on SCr of 1.72 mg/dL (H)).  Liver Function Tests: Recent Labs  Lab 09/04/17 0902 09/06/17 0532  AST 22 32  ALT 29 35  ALKPHOS 40 68  BILITOT 1.2 1.2  PROT 6.9 6.3*  ALBUMIN 4.0 3.2*    Recent Labs  Lab 09/04/17 0902  LIPASE 25   Cardiac Enzymes: Recent Labs  Lab 09/03/17 2210  09/04/17 0206 09/04/17 0359  TROPONINI 0.03* 0.03* 0.04*      Radiology Studies: Ct Abdomen Pelvis Wo Contrast  Result Date: 09/05/2017 CLINICAL DATA:  57 year old male with history of abdominal pain. Gastroenteritis or colitis is suspected. EXAM: CT ABDOMEN AND PELVIS WITHOUT CONTRAST TECHNIQUE: Multidetector CT imaging of the abdomen and pelvis was performed following the standard protocol without IV contrast. COMPARISON:  No priors. FINDINGS: Lower chest: Linear areas of scarring or atelectasis are noted in the lower lobes of the lungs bilaterally. Hepatobiliary: Diffuse low attenuation throughout the visualized  hepatic parenchyma, indicative of severe hepatic steatosis. No definite cystic or solid hepatic lesions are confidently identified on today's noncontrast CT examination. There is a gallstone in the region of the neck of the gallbladder or cystic duct. The gallbladder is only moderately distended, but the gallbladder wall appears thickened and there are extensive surrounding inflammatory changes, concerning for an acute cholecystitis. Pancreas: No definite pancreatic mass or peripancreatic fluid or inflammatory changes are noted on today's noncontrast CT examination. Spleen: Unremarkable. Adrenals/Urinary Tract: Unenhanced appearance of the kidneys and bilateral adrenal glands is normal. No hydroureteronephrosis. No nephrolithiasis. Urinary bladder is normal in appearance. Stomach/Bowel: Unenhanced appearance of the stomach is normal. No pathologic dilatation of small bowel or colon. Inflammatory changes are noted adjacent to the second portion of the duodenum, likely related to the acute process in the gallbladder. Numerous colonic diverticulae are noted, without surrounding inflammatory changes to suggest an acute diverticulitis at this time. Inflammatory changes are also noted adjacent to the hepatic flexure of the colon, also likely from the gallbladder. The appendix is not confidently identified and may be surgically absent. Regardless, there are no inflammatory changes noted adjacent to the cecum to suggest the presence of an acute appendicitis at this time. Vascular/Lymphatic: Aortic atherosclerosis. No lymphadenopathy noted in the abdomen or pelvis. Reproductive: Prostate gland and seminal vesicles are unremarkable in appearance. Other: Trace volume of ascites.  No pneumoperitoneum. Musculoskeletal: There are no aggressive appearing lytic or blastic lesions noted in the visualized portions of the skeleton. IMPRESSION: 1. Findings are highly concerning for an acute cholecystitis. This could be confirmed with  right upper quadrant ultrasound. 2. Severe hepatic steatosis. 3. Aortic atherosclerosis. 4. Trace volume of ascites, likely reactive. These results were called by telephone at the time of interpretation on 09/05/2017 at 6:50 pm to Nurse Ray for Dr. Bonnielee Haff, who verbally acknowledged these results. Aortic Atherosclerosis (ICD10-I70.0). Electronically Signed   By: Vinnie Langton M.D.   On: 09/05/2017 18:51   US Abdomen Complete  Result Date: 09/04/2017 CLINICAL DATA:  Right upper quadrant abdominal pain over the last 2 days. EXAM: ABDOMEN ULTRASOUND COMPLETE COMPARISON:  None. FINDINGS: Gallbladder: No gallstones or wall thickening visualized. No sonographic Murphy sign noted by sonographer. Common bile duct: Diameter: 5 mm common normal Liver: Diffusely echogenic liver suggesting fatty change. No focal lesion or ductal dilatation. Portal vein is patent on color Doppler imaging with normal direction of blood flow towards the liver. IVC: Poorly seen because of overlying bowel gas. Pancreas: Head and body appear normal. Tail not well seen because of overlying bowel gas. Spleen: Size and appearance within normal limits. Right Kidney: Length: 12.0 cm. 1 cm cyst. Echogenicity within normal limits. No mass or hydronephrosis visualized. Left Kidney: Length: 10.9 cm. Echogenicity within normal limits. No mass or hydronephrosis visualized. Abdominal aorta: Maximal diameter 2.6 cm. Partially obscured by gas. Other findings: No ascites IMPRESSION: Diffusely echogenic liver consistent with steatosis. This can be painful.  No gallbladder pathology or ductal dilatation is seen. Electronically Signed   By: Nelson Chimes M.D.   On: 09/04/2017 11:24   Nm Myocar Multi W/spect W/wall Motion / Ef  Result Date: 09/04/2017  There was no ST segment deviation noted during stress.  This is an intermediate risk study.  The left ventricular ejection fraction is moderately decreased (30-44%).  1. Fixed medium-sized, moderate  intensity mid to apical anteroseptal, mid to apical inferoseptal, and true apex perfusion defect.  Prior infarction involving septum versus LBBB artifact.  No significant ischemia. 2. EF 39% with septal dyskinesis. Intermediate risk study because of low EF.     Medications:  Scheduled: . allopurinol  100 mg Oral BID  . carvedilol  25 mg Oral BID WC  . enoxaparin (LOVENOX) injection  40 mg Subcutaneous Q24H  . famotidine  20 mg Oral Daily  . pantoprazole  40 mg Oral BID AC  . simvastatin  40 mg Oral QHS   Continuous: . piperacillin-tazobactam (ZOSYN)  IV 3.375 g (09/06/17 0421)   AVW:[id number], gi cocktail, morphine injection, nitroGLYCERIN, ondansetron (ZOFRAN) IV  Assessment/Plan:   Right upper quadrant abdominal pain and tenderness LFTs were normal.  Lipase was normal.  Right upper quadrant ultrasound did not show any biliary issues.  No gallstones.  However patient continued to have persistent symptoms.  CT scan was done which showed inflammation around the gallbladder with possible stone in the neck of the gallbladder or in the cystic duct.  In view of the surgeon and surgery was consulted who recommended HIDA scan.  This is pending.  Patient may need surgical intervention.  He will need to be cleared by cardiology prior to that.  Continue PPI for now.  Patient started on Zosyn yesterday.  Chest pain in the setting of known chronic systolic CHF due to nonischemic cardiomyopathy Patient with mildly elevated troponins.  With history of LBBB. Seen by cardiology. Underwent stress test which was intermediate risk.  If he needs gallbladder surgery he will need to be reevaluated by cardiology as patient may need to undergo cardiac catheterization.  They will see him tomorrow.  Hold ACE inhibitor and Lasix due to worsening creatinine.  Acute renal failure Unclear if patient has a history of CKD.  Creatinine noted to climb yesterday.  Could be because he has not had anything to eat or  drink more than a day.  There is an element of hypovolemia.  He was started on gentle IV hydration.  Monitor urine output.  Recheck labs tomorrow.    Essential hypertension Blood pressure is reasonably well controlled.  Continue with beta-blocker.  ACE inhibitor on hold due to rising creatinine..  Obstructive sleep apnea CPAP.  DVT Prophylaxis: Lovenox    Code Status: Full code Family Communication: Discussed with the patient Disposition Plan: Management as outlined above.  Mobilize as tolerated.    LOS: 0 days   [facility] Pager [phone] 09/06/2017, 8:55 AM  If 7PM-7AM, please contact night-coverage at [email or web address], password [facility]

## 2017-09-07 ENCOUNTER — Observation Stay (HOSPITAL_COMMUNITY): Payer: 59 | Admitting: Anesthesiology

## 2017-09-07 ENCOUNTER — Encounter (HOSPITAL_COMMUNITY)
Admission: EM | Disposition: A | Discharge status: Home / Self Care | Payer: Self-pay | Source: Home / Self Care | Attending: Internal Medicine

## 2017-09-07 ENCOUNTER — Encounter (HOSPITAL_COMMUNITY): Payer: Self-pay | Admitting: General Practice

## 2017-09-07 DIAGNOSIS — K810 Acute cholecystitis: Principal | ICD-10-CM

## 2017-09-07 DIAGNOSIS — R1011 Right upper quadrant pain: Secondary | ICD-10-CM | POA: Diagnosis not present

## 2017-09-07 DIAGNOSIS — I447 Left bundle-branch block, unspecified: Secondary | ICD-10-CM | POA: Diagnosis not present

## 2017-09-07 DIAGNOSIS — I10 Essential (primary) hypertension: Secondary | ICD-10-CM | POA: Diagnosis not present

## 2017-09-07 DIAGNOSIS — I5022 Chronic systolic (congestive) heart failure: Secondary | ICD-10-CM | POA: Diagnosis not present

## 2017-09-07 DIAGNOSIS — N179 Acute kidney failure, unspecified: Secondary | ICD-10-CM | POA: Diagnosis not present

## 2017-09-07 DIAGNOSIS — I110 Hypertensive heart disease with heart failure: Secondary | ICD-10-CM | POA: Diagnosis not present

## 2017-09-07 DIAGNOSIS — R079 Chest pain, unspecified: Secondary | ICD-10-CM | POA: Diagnosis not present

## 2017-09-07 HISTORY — PX: CHOLECYSTECTOMY: SHX55

## 2017-09-07 LAB — COMPREHENSIVE METABOLIC PANEL
ALT: 140 U/L — AB (ref 17–63)
AST: 128 U/L — AB (ref 15–41)
Albumin: 3 g/dL — ABNORMAL LOW (ref 3.5–5.0)
Alkaline Phosphatase: 72 U/L (ref 38–126)
Anion gap: 8 (ref 5–15)
BUN: 16 mg/dL (ref 6–20)
CHLORIDE: 98 mmol/L — AB (ref 101–111)
CO2: 23 mmol/L (ref 22–32)
CREATININE: 1.61 mg/dL — AB (ref 0.61–1.24)
Calcium: 8.1 mg/dL — ABNORMAL LOW (ref 8.9–10.3)
GFR calc Af Amer: 53 mL/min — ABNORMAL LOW (ref >60)
GFR, EST NON AFRICAN AMERICAN: 46 mL/min — AB (ref >60)
GLUCOSE: 106 mg/dL — AB (ref 65–99)
Potassium: 4 mmol/L (ref 3.5–5.1)
Sodium: 129 mmol/L — ABNORMAL LOW (ref 135–145)
Total Bilirubin: 1.7 mg/dL — ABNORMAL HIGH (ref 0.3–1.2)
Total Protein: 6.6 g/dL (ref 6.5–8.1)

## 2017-09-07 LAB — CBC
HCT: 32.3 % — ABNORMAL LOW (ref 39.0–52.0)
Hemoglobin: 11.1 g/dL — ABNORMAL LOW (ref 13.0–17.0)
MCH: 31.1 pg (ref 26.0–34.0)
MCHC: 34.4 g/dL (ref 30.0–36.0)
MCV: 90.5 fL (ref 78.0–100.0)
PLATELETS: 155 10*3/uL (ref 150–400)
RBC: 3.57 MIL/uL — AB (ref 4.22–5.81)
RDW: 12.5 % (ref 11.5–15.5)
WBC: 10.8 10*3/uL — AB (ref 4.0–10.5)

## 2017-09-07 LAB — SURGICAL PCR SCREEN
MRSA, PCR: NEGATIVE
Staphylococcus aureus: POSITIVE — AB

## 2017-09-07 SURGERY — LAPAROSCOPIC CHOLECYSTECTOMY WITH INTRAOPERATIVE CHOLANGIOGRAM
Anesthesia: General

## 2017-09-07 MED ORDER — SODIUM CHLORIDE 0.45 % IV SOLN
INTRAVENOUS | Status: DC
  Administered 2017-09-07: 14:00:00 via INTRAVENOUS

## 2017-09-07 MED ORDER — IOPAMIDOL (ISOVUE-300) INJECTION 61%
INTRAVENOUS | Status: AC
  Filled 2017-09-07: qty 50

## 2017-09-07 MED ORDER — BUPIVACAINE HCL (PF) 0.25 % IJ SOLN
INTRAMUSCULAR | Status: AC
  Filled 2017-09-07: qty 30

## 2017-09-07 MED ORDER — PHENYLEPHRINE 40 MCG/ML (10ML) SYRINGE FOR IV PUSH (FOR BLOOD PRESSURE SUPPORT)
PREFILLED_SYRINGE | INTRAVENOUS | Status: DC | PRN
  Administered 2017-09-07 (×2): 80 ug via INTRAVENOUS
  Administered 2017-09-07: 120 ug via INTRAVENOUS

## 2017-09-07 MED ORDER — HYDROMORPHONE HCL 2 MG/ML IJ SOLN
INTRAMUSCULAR | Status: AC
  Filled 2017-09-07: qty 1

## 2017-09-07 MED ORDER — BUPIVACAINE HCL 0.25 % IJ SOLN
INTRAMUSCULAR | Status: DC | PRN
  Administered 2017-09-07: 30 mL

## 2017-09-07 MED ORDER — PROPOFOL 10 MG/ML IV BOLUS
INTRAVENOUS | Status: AC
  Filled 2017-09-07: qty 20

## 2017-09-07 MED ORDER — FENTANYL CITRATE (PF) 250 MCG/5ML IJ SOLN
INTRAMUSCULAR | Status: AC
  Filled 2017-09-07: qty 5

## 2017-09-07 MED ORDER — FENTANYL CITRATE (PF) 100 MCG/2ML IJ SOLN
INTRAMUSCULAR | Status: DC | PRN
  Administered 2017-09-07: 100 ug via INTRAVENOUS

## 2017-09-07 MED ORDER — DEXAMETHASONE SODIUM PHOSPHATE 10 MG/ML IJ SOLN
INTRAMUSCULAR | Status: AC
  Filled 2017-09-07: qty 1

## 2017-09-07 MED ORDER — LACTATED RINGERS IV SOLN
INTRAVENOUS | Status: DC
  Administered 2017-09-07: 15:00:00 via INTRAVENOUS
  Administered 2017-09-09: 10 mL/h via INTRAVENOUS

## 2017-09-07 MED ORDER — LACTATED RINGERS IV SOLN
INTRAVENOUS | Status: DC | PRN
  Administered 2017-09-07: 16:00:00 via INTRAVENOUS

## 2017-09-07 MED ORDER — MIDAZOLAM HCL 2 MG/2ML IJ SOLN
INTRAMUSCULAR | Status: AC
  Filled 2017-09-07: qty 2

## 2017-09-07 MED ORDER — SUGAMMADEX SODIUM 200 MG/2ML IV SOLN
INTRAVENOUS | Status: DC | PRN
  Administered 2017-09-07: 240 mg via INTRAVENOUS

## 2017-09-07 MED ORDER — ROCURONIUM BROMIDE 100 MG/10ML IV SOLN
INTRAVENOUS | Status: DC | PRN
  Administered 2017-09-07: 40 mg via INTRAVENOUS

## 2017-09-07 MED ORDER — PHENYLEPHRINE HCL 10 MG/ML IJ SOLN
INTRAVENOUS | Status: DC | PRN
  Administered 2017-09-07: 40 ug/min via INTRAVENOUS

## 2017-09-07 MED ORDER — HYDROMORPHONE HCL 2 MG/ML IJ SOLN
0.5000 mg | INTRAMUSCULAR | Status: DC | PRN
  Administered 2017-09-07: 0.5 mg via INTRAVENOUS

## 2017-09-07 MED ORDER — SUCCINYLCHOLINE CHLORIDE 200 MG/10ML IV SOSY
PREFILLED_SYRINGE | INTRAVENOUS | Status: DC | PRN
  Administered 2017-09-07: 120 mg via INTRAVENOUS

## 2017-09-07 MED ORDER — SUGAMMADEX SODIUM 200 MG/2ML IV SOLN
INTRAVENOUS | Status: AC
  Filled 2017-09-07: qty 4

## 2017-09-07 MED ORDER — DEXAMETHASONE SODIUM PHOSPHATE 10 MG/ML IJ SOLN
INTRAMUSCULAR | Status: DC | PRN
  Administered 2017-09-07: 10 mg via INTRAVENOUS

## 2017-09-07 MED ORDER — ACETAMINOPHEN 10 MG/ML IV SOLN
INTRAVENOUS | Status: DC | PRN
  Administered 2017-09-07: 1000 mg via INTRAVENOUS

## 2017-09-07 MED ORDER — ONDANSETRON HCL 4 MG/2ML IJ SOLN
INTRAMUSCULAR | Status: AC
  Filled 2017-09-07: qty 2

## 2017-09-07 MED ORDER — HYDROCODONE-ACETAMINOPHEN 5-325 MG PO TABS
1.0000 | ORAL_TABLET | Freq: Four times a day (QID) | ORAL | Status: DC | PRN
  Administered 2017-09-07 – 2017-09-08 (×2): 1 via ORAL
  Filled 2017-09-07 (×2): qty 1

## 2017-09-07 MED ORDER — ONDANSETRON HCL 4 MG/2ML IJ SOLN
INTRAMUSCULAR | Status: DC | PRN
  Administered 2017-09-07: 4 mg via INTRAVENOUS

## 2017-09-07 MED ORDER — MIDAZOLAM HCL 2 MG/2ML IJ SOLN
INTRAMUSCULAR | Status: DC | PRN
  Administered 2017-09-07: 2 mg via INTRAVENOUS

## 2017-09-07 MED ORDER — PROPOFOL 10 MG/ML IV BOLUS
INTRAVENOUS | Status: DC | PRN
  Administered 2017-09-07: 130 mg via INTRAVENOUS

## 2017-09-07 MED ORDER — 0.9 % SODIUM CHLORIDE (POUR BTL) OPTIME
TOPICAL | Status: DC | PRN
  Administered 2017-09-07: 1000 mL

## 2017-09-07 MED ORDER — ACETAMINOPHEN 10 MG/ML IV SOLN
INTRAVENOUS | Status: AC
  Filled 2017-09-07: qty 100

## 2017-09-07 MED ORDER — LIDOCAINE 2% (20 MG/ML) 5 ML SYRINGE
INTRAMUSCULAR | Status: DC | PRN
  Administered 2017-09-07: 100 mg via INTRAVENOUS

## 2017-09-07 SURGICAL SUPPLY — 54 items
APPLIER CLIP ROT 10 11.4 M/L (STAPLE)
BIOPATCH RED 1 DISK 7.0 (GAUZE/BANDAGES/DRESSINGS) ×2 IMPLANT
BIOPATCH RED 1IN DISK 7.0MM (GAUZE/BANDAGES/DRESSINGS) ×1
BLADE CLIPPER SURG (BLADE) ×3 IMPLANT
CANISTER SUCT 3000ML PPV (MISCELLANEOUS) ×3 IMPLANT
CHLORAPREP W/TINT 26ML (MISCELLANEOUS) ×3 IMPLANT
CLIP APPLIE ROT 10 11.4 M/L (STAPLE) IMPLANT
CLIP LIGATING HEMO O LOK GREEN (MISCELLANEOUS) ×3 IMPLANT
CLIP VESOLOCK XL 6/CT (CLIP) IMPLANT
COVER MAYO STAND STRL (DRAPES) ×3 IMPLANT
COVER SURGICAL LIGHT HANDLE (MISCELLANEOUS) ×3 IMPLANT
DERMABOND ADVANCED (GAUZE/BANDAGES/DRESSINGS) ×2
DERMABOND ADVANCED .7 DNX12 (GAUZE/BANDAGES/DRESSINGS) ×1 IMPLANT
DRAIN CHANNEL 19F RND (DRAIN) ×3 IMPLANT
DRAPE C-ARM 42X72 X-RAY (DRAPES) ×3 IMPLANT
DRSG TEGADERM 4X4.75 (GAUZE/BANDAGES/DRESSINGS) ×3 IMPLANT
ELECT REM PT RETURN 9FT ADLT (ELECTROSURGICAL) ×3
ELECTRODE REM PT RTRN 9FT ADLT (ELECTROSURGICAL) ×1 IMPLANT
ENDOLOOP SUT PDS II  0 18 (SUTURE) ×6
ENDOLOOP SUT PDS II 0 18 (SUTURE) ×3 IMPLANT
EVACUATOR SILICONE 100CC (DRAIN) ×3 IMPLANT
GLOVE BIO SURGEON STRL SZ8.5 (GLOVE) ×3 IMPLANT
GLOVE BIOGEL PI IND STRL 7.0 (GLOVE) ×1 IMPLANT
GLOVE BIOGEL PI IND STRL 8.5 (GLOVE) ×1 IMPLANT
GLOVE BIOGEL PI INDICATOR 7.0 (GLOVE) ×2
GLOVE BIOGEL PI INDICATOR 8.5 (GLOVE) ×2
GLOVE SURG SS PI 7.0 STRL IVOR (GLOVE) ×3 IMPLANT
GOWN STRL REUS W/ TWL LRG LVL3 (GOWN DISPOSABLE) ×4 IMPLANT
GOWN STRL REUS W/ TWL XL LVL3 (GOWN DISPOSABLE) ×1 IMPLANT
GOWN STRL REUS W/TWL LRG LVL3 (GOWN DISPOSABLE) ×8
GOWN STRL REUS W/TWL XL LVL3 (GOWN DISPOSABLE) ×2
GRASPER SUT TROCAR 14GX15 (MISCELLANEOUS) ×3 IMPLANT
KIT BASIN OR (CUSTOM PROCEDURE TRAY) ×3 IMPLANT
KIT TURNOVER KIT B (KITS) ×3 IMPLANT
NEEDLE 22X1 1/2 (OR ONLY) (NEEDLE) ×3 IMPLANT
NS IRRIG 1000ML POUR BTL (IV SOLUTION) ×3 IMPLANT
PAD ARMBOARD 7.5X6 YLW CONV (MISCELLANEOUS) ×3 IMPLANT
POUCH RETRIEVAL ECOSAC 10 (ENDOMECHANICALS) ×1 IMPLANT
POUCH RETRIEVAL ECOSAC 10MM (ENDOMECHANICALS) ×2
SCISSORS LAP 5X35 DISP (ENDOMECHANICALS) ×3 IMPLANT
SET CHOLANGIOGRAPH 5 50 .035 (SET/KITS/TRAYS/PACK) ×3 IMPLANT
SET IRRIG TUBING LAPAROSCOPIC (IRRIGATION / IRRIGATOR) ×3 IMPLANT
SLEEVE ENDOPATH XCEL 5M (ENDOMECHANICALS) ×6 IMPLANT
SPECIMEN JAR SMALL (MISCELLANEOUS) ×3 IMPLANT
STOPCOCK 4 WAY LG BORE MALE ST (IV SETS) ×3 IMPLANT
SUT ETHILON 2 0 FS 18 (SUTURE) ×3 IMPLANT
SUT MNCRL AB 4-0 PS2 18 (SUTURE) ×6 IMPLANT
TOWEL OR 17X24 6PK STRL BLUE (TOWEL DISPOSABLE) ×3 IMPLANT
TOWEL OR 17X26 10 PK STRL BLUE (TOWEL DISPOSABLE) ×3 IMPLANT
TRAY LAPAROSCOPIC MC (CUSTOM PROCEDURE TRAY) ×3 IMPLANT
TROCAR XCEL 12X100 BLDLESS (ENDOMECHANICALS) ×3 IMPLANT
TROCAR XCEL NON-BLD 5MMX100MML (ENDOMECHANICALS) ×3 IMPLANT
TUBING INSUFFLATION (TUBING) ×3 IMPLANT
WATER STERILE IRR 1000ML POUR (IV SOLUTION) ×3 IMPLANT

## 2017-09-07 NOTE — Progress Notes (Signed)
Pt has home unit and places self on/off.  RT will monitor. 

## 2017-09-07 NOTE — Progress Notes (Signed)
Consent for Lap Chole obtained and placed on chart. Patient has remained NPO since MN.

## 2017-09-07 NOTE — Progress Notes (Signed)
[facility] PROGRESS NOTE  Nicholas Gray [id number] DOB: 02/18/1961 DOA: 09/03/2017  PCP: [facility]  Brief History/Interval Summary: 57 year old Caucasian male with a past medical history of dilated cardiomyopathy with a EF of 25 to 30%, hypertension, obstructive sleep apnea, clean coronaries based on cardiac cath in 2010 presented with chest pain.  Patient was hospitalized for further management.  Patient was seen by cardiology.  He had a stress test which was intermediate risk.  He was also noted to have abdominal discomfort and pain.  He underwent ultrasound followed by CT scan and then a HIDA scan.  These tests suggest acute cholecystitis.  General surgery consulted.  Reason for Visit: Chest pain  Consultants: Cardiology.  General surgery.  Procedures:  Stress test  There was no ST segment deviation noted during stress.  This is an intermediate risk study.  The left ventricular ejection fraction is moderately decreased (30-44%).   1. Fixed medium-sized, moderate intensity mid to apical anteroseptal, mid to apical inferoseptal, and true apex perfusion defect.  Prior infarction involving septum versus LBBB artifact.  No significant ischemia.  2. EF 39% with septal dyskinesis.  Intermediate risk study because of low EF.   Antibiotics: Zosyn 6/1  Subjective/Interval History: Patient continues to complain of right upper quadrant abdominal pain and tenderness.  Some nausea but no vomiting.  No chest patient continues to have pain in the right upper abdomen.  Some nausea but no vomiting.  No chest pain.    ROS: No headaches.  Objective:  Vital Signs  Vitals:   09/06/17 1328 09/06/17 1810 09/06/17 2036 09/07/17 0557  BP: (!) 90/57 124/61 (!) 166/64 130/68  Pulse: 73  82 87  Resp:   20 20  Temp: 97.7 F (36.5 C)  98.5 F (36.9 C) 99 F (37.2 C)  TempSrc: Oral  Oral Oral  SpO2: 94%  98% 94%  Weight:    118.8 kg (261 lb 14.4 oz)    Height:        Intake/Output Summary (Last 24 hours) at 09/07/2017 1315 Last data filed at 09/07/2017 1256 Gross per 24 hour  Intake 680 ml  Output -  Net 680 ml   Filed Weights   09/04/17 0030 09/05/17 0537 09/07/17 0557  Weight: 119.4 kg (263 lb 3.7 oz) 119.2 kg (262 lb 12.8 oz) 118.8 kg (261 lb 14.4 oz)    General appearance: Awake alert.  In no distress. Resp: Clear to auscultation bilaterally.  No wheezing rales or rhonchi. Cardio: S1-S2 is normal regular.  No S3-S4.  No rubs murmurs or bruit.  No pedal edema. GI: Abdomen remains soft.  Continues to be tender in the right upper quadrant and epigastrium.  No rebound rigidity or guarding.  No masses organomegaly.   Neurologic: no Focal deficits.  Lab Results:  Data Reviewed: I have personally reviewed following labs and imaging studies  CBC: Recent Labs  Lab 09/03/17 1115 09/04/17 0902 09/05/17 0746 09/06/17 0532 09/07/17 0408  WBC 8.1 14.8* 15.2* 12.7* 10.8*  HGB 12.7* 13.1 11.5* 10.9* 11.1*  HCT 37.0* 38.3* 34.1* 32.2* 32.3*  MCV 90.0 91.4 90.7 91.7 90.5  PLT 218 199 161 148* 967    Basic Metabolic Panel: Recent Labs  Lab 09/03/17 1115 09/04/17 0902 09/05/17 0746 09/06/17 0532 09/07/17 0408  NA 134* 131* 132* 131* 129*  K 4.9 4.1 4.1 4.0 4.0  CL 102 99* 98* 97* 98*  CO2 24 23 23 24 23   GLUCOSE 114* 127* 111* 115*  106*  BUN 20 15 19  21* 16  CREATININE 1.46* 1.31* 1.63* 1.72* 1.61*  CALCIUM 9.8 9.0 8.7* 8.4* 8.1*    GFR: Estimated Creatinine Clearance: 64.6 mL/min (A) (by C-G formula based on SCr of 1.61 mg/dL (H)).  Liver Function Tests: Recent Labs  Lab 09/04/17 0902 09/06/17 0532 09/07/17 0408  AST 22 32 128*  ALT 29 35 140*  ALKPHOS 40 68 72  BILITOT 1.2 1.2 1.7*  PROT 6.9 6.3* 6.6  ALBUMIN 4.0 3.2* 3.0*    Recent Labs  Lab 09/04/17 0902  LIPASE 25   Cardiac Enzymes: Recent Labs  Lab 09/03/17 2210 09/04/17 0206 09/04/17 0359  TROPONINI 0.03* 0.03* 0.04*      Radiology  Studies: Ct Abdomen Pelvis Wo Contrast  Result Date: 09/05/2017 CLINICAL DATA:  57 year old male with history of abdominal pain. Gastroenteritis or colitis is suspected. EXAM: CT ABDOMEN AND PELVIS WITHOUT CONTRAST TECHNIQUE: Multidetector CT imaging of the abdomen and pelvis was performed following the standard protocol without IV contrast. COMPARISON:  No priors. FINDINGS: Lower chest: Linear areas of scarring or atelectasis are noted in the lower lobes of the lungs bilaterally. Hepatobiliary: Diffuse low attenuation throughout the visualized hepatic parenchyma, indicative of severe hepatic steatosis. No definite cystic or solid hepatic lesions are confidently identified on today's noncontrast CT examination. There is a gallstone in the region of the neck of the gallbladder or cystic duct. The gallbladder is only moderately distended, but the gallbladder wall appears thickened and there are extensive surrounding inflammatory changes, concerning for an acute cholecystitis. Pancreas: No definite pancreatic mass or peripancreatic fluid or inflammatory changes are noted on today's noncontrast CT examination. Spleen: Unremarkable. Adrenals/Urinary Tract: Unenhanced appearance of the kidneys and bilateral adrenal glands is normal. No hydroureteronephrosis. No nephrolithiasis. Urinary bladder is normal in appearance. Stomach/Bowel: Unenhanced appearance of the stomach is normal. No pathologic dilatation of small bowel or colon. Inflammatory changes are noted adjacent to the second portion of the duodenum, likely related to the acute process in the gallbladder. Numerous colonic diverticulae are noted, without surrounding inflammatory changes to suggest an acute diverticulitis at this time. Inflammatory changes are also noted adjacent to the hepatic flexure of the colon, also likely from the gallbladder. The appendix is not confidently identified and may be surgically absent. Regardless, there are no inflammatory changes  noted adjacent to the cecum to suggest the presence of an acute appendicitis at this time. Vascular/Lymphatic: Aortic atherosclerosis. No lymphadenopathy noted in the abdomen or pelvis. Reproductive: Prostate gland and seminal vesicles are unremarkable in appearance. Other: Trace volume of ascites.  No pneumoperitoneum. Musculoskeletal: There are no aggressive appearing lytic or blastic lesions noted in the visualized portions of the skeleton. IMPRESSION: 1. Findings are highly concerning for an acute cholecystitis. This could be confirmed with right upper quadrant ultrasound. 2. Severe hepatic steatosis. 3. Aortic atherosclerosis. 4. Trace volume of ascites, likely reactive. These results were called by telephone at the time of interpretation on 09/05/2017 at 6:50 pm to Nurse Ray for Dr. Bonnielee Haff, who verbally acknowledged these results. Aortic Atherosclerosis (ICD10-I70.0). Electronically Signed   By: Vinnie Langton M.D.   On: 09/05/2017 18:51   Nm Hepato W/eject Fract  Result Date: 09/06/2017 CLINICAL DATA:  RIGHT upper quadrant pain. EXAM: NUCLEAR MEDICINE HEPATOBILIARY IMAGING TECHNIQUE: Sequential images of the abdomen were obtained out to 60 minutes following intravenous administration of radiopharmaceutical. RADIOPHARMACEUTICALS:  5.2 mCi Tc-11m  Choletec IV and 3 mg morphine COMPARISON:  CT abdomen dated 09/05/2017. FINDINGS: Prompt uptake  and biliary excretion of activity by the liver is seen. Biliary activity passes into small bowel, consistent with patent common bile duct. No gallbladder activity visualized indicating acute cholecystitis. IMPRESSION: Findings are consistent with acute cholecystitis. Electronically Signed   By: Franki Cabot M.D.   On: 09/06/2017 11:44     Medications:  Scheduled: . allopurinol  100 mg Oral BID  . carvedilol  25 mg Oral BID WC  . enoxaparin (LOVENOX) injection  40 mg Subcutaneous Q24H  . famotidine  20 mg Oral Daily  . pantoprazole  40 mg Oral BID AC    . simvastatin  40 mg Oral QHS   Continuous: . piperacillin-tazobactam (ZOSYN)  IV 3.375 g (09/07/17 1211)   FUX:[id number], gi cocktail, morphine injection, nitroGLYCERIN, ondansetron (ZOFRAN) IV  Assessment/Plan:   Acute cholecystitis Initially patient's LFTs were normal.  Right upper quadrant ultrasound did not show any biliary disease.  Due to persistent symptoms of CT scan of the abdomen pelvis was done which suggested acute cholecystitis.  General surgery was consulted.  Patient underwent HIDA scan which shows acute cholecystitis.  Plan is for surgical intervention once cleared by cardiology.  Continue Zosyn.  AST ALT noted to be high today.  Mild rise in bilirubin also noted.  Alkaline phosphatase is normal.  Chest pain in the setting of known chronic systolic CHF due to nonischemic cardiomyopathy Patient with mildly elevated troponins.  With history of LBBB. Seen by cardiology. Underwent stress test which was intermediate risk.  Discussed with cardiology yesterday.  They have reevaluated the patient today.  They do not think that the patient needs to undergo cardiac catheterization at this time.  They have determined that patient may proceed to surgery and patient carries a low to moderate risk.  Continuing to hold his Lasix and ACE inhibitor due to high creatinine.   Acute renal failure Unclear if patient has a history of CKD.  Being climbed from 1.31-1.63 and then 1.72.  Most likely due to hypovolemia as the patient was n.p.o. for 2 to 3 days.  He was started on gentle IV hydration.  He is making urine.  Creatinine improved to 1.61 today.  Continue to hold his ACE inhibitor.     Essential hypertension Blood pressure is reasonably well controlled.  Continue with beta-blocker.  ACE inhibitor on hold due to rising creatinine..  Obstructive sleep apnea CPAP.  DVT Prophylaxis: Lovenox    Code Status: Full code Family Communication: Discussed with patient and his wife Disposition  Plan: Await surgery.    LOS: 0 days   [facility] Pager [phone] 09/07/2017, 1:15 PM  If 7PM-7AM, please contact night-coverage at [email or web address], password [facility]

## 2017-09-07 NOTE — Anesthesia Procedure Notes (Signed)
Procedure Name: Intubation Date/Time: 09/07/2017 3:48 PM Performed by: Leonor Liv, CRNA Pre-anesthesia Checklist: Patient identified, Emergency Drugs available, Suction available and Patient being monitored Patient Re-evaluated:Patient Re-evaluated prior to induction Oxygen Delivery Method: Circle System Utilized Preoxygenation: Pre-oxygenation with 100% oxygen Induction Type: IV induction Ventilation: Mask ventilation without difficulty Laryngoscope Size: Mac and 4 Grade View: Grade I Tube type: Oral Tube size: 7.5 mm Number of attempts: 1 Airway Equipment and Method: Stylet and Oral airway Placement Confirmation: ETT inserted through vocal cords under direct vision,  positive ETCO2 and breath sounds checked- equal and bilateral Secured at: 23 cm Tube secured with: Tape Dental Injury: Teeth and Oropharynx as per pre-operative assessment

## 2017-09-07 NOTE — Progress Notes (Addendum)
Progress Note  Patient Name: Nicholas Gray Date of Encounter: 09/07/2017  Primary Cardiologist: Minus Breeding, MD   Subjective   Patient frustrated this AM. Wants to have a cholecystectomy but was told that he needs a heart cath first and does not understand why. He states that he is able to do everything he wants to do without anginal symptoms but does get SOB when walking greater than 0.2 miles. Denies orthopnea, PND, or persistent cough. He does have HTN and HFrEF. He denies the use of tobacco or EtOH. Denies a family history of early MI or CVA. He reports having a heart cath in 2010 that was normal.   Inpatient Medications    Scheduled Meds: . allopurinol  100 mg Oral BID  . carvedilol  25 mg Oral BID WC  . enoxaparin (LOVENOX) injection  40 mg Subcutaneous Q24H  . famotidine  20 mg Oral Daily  . pantoprazole  40 mg Oral BID AC  . simvastatin  40 mg Oral QHS   Continuous Infusions: . piperacillin-tazobactam (ZOSYN)  IV 3.375 g (09/07/17 0440)   PRN Meds: acetaminophen, gi cocktail, morphine injection, nitroGLYCERIN, ondansetron (ZOFRAN) IV   Vital Signs    Vitals:   09/06/17 1328 09/06/17 1810 09/06/17 2036 09/07/17 0557  BP: (!) 90/57 124/61 (!) 166/64 130/68  Pulse: 73  82 87  Resp:   20 20  Temp: 97.7 F (36.5 C)  98.5 F (36.9 C) 99 F (37.2 C)  TempSrc: Oral  Oral Oral  SpO2: 94%  98% 94%  Weight:    261 lb 14.4 oz (118.8 kg)  Height:        Intake/Output Summary (Last 24 hours) at 09/07/2017 0906 Last data filed at 09/07/2017 0758 Gross per 24 hour  Intake 860 ml  Output -  Net 860 ml   Filed Weights   09/04/17 0030 09/05/17 0537 09/07/17 0557  Weight: 263 lb 3.7 oz (119.4 kg) 262 lb 12.8 oz (119.2 kg) 261 lb 14.4 oz (118.8 kg)    Telemetry    Sinus rhythm - Personally Reviewed  ECG    No new EKG for review - Personally Reviewed  Physical Exam   General: Obese male in no acute distress HENT: Normocephalic, atraumatic, moist mucus  membranes Pulm: Good air movement with no wheezing or crackles  CV: RRR, no murmurs, no rubs  Abdomen: Active bowel sounds, soft, non-distended, tenderness to palpation of the RUQ Extremities: Pulses palpable in all extremities, no LE edema  Skin: Warm and dry  Neuro: Alert and oriented x 3  Labs    Chemistry Recent Labs  Lab 09/04/17 0902 09/05/17 0746 09/06/17 0532 09/07/17 0408  NA 131* 132* 131* 129*  K 4.1 4.1 4.0 4.0  CL 99* 98* 97* 98*  CO2 23 23 24 23   GLUCOSE 127* 111* 115* 106*  BUN 15 19 21* 16  CREATININE 1.31* 1.63* 1.72* 1.61*  CALCIUM 9.0 8.7* 8.4* 8.1*  PROT 6.9  --  6.3* 6.6  ALBUMIN 4.0  --  3.2* 3.0*  AST 22  --  32 128*  ALT 29  --  35 140*  ALKPHOS 40  --  68 72  BILITOT 1.2  --  1.2 1.7*  GFRNONAA 58* 45* 42* 46*  GFRAA >60 52* 49* 53*  ANIONGAP 9 11 10 8     Hematology Recent Labs  Lab 09/05/17 0746 09/06/17 0532 09/07/17 0408  WBC 15.2* 12.7* 10.8*  RBC 3.76* 3.51* 3.57*  HGB 11.5* 10.9*  11.1*  HCT 34.1* 32.2* 32.3*  MCV 90.7 91.7 90.5  MCH 30.6 31.1 31.1  MCHC 33.7 33.9 34.4  RDW 12.8 12.8 12.5  PLT 161 148* 155   Cardiac Enzymes Recent Labs  Lab 09/03/17 2210 09/04/17 0206 09/04/17 0359  TROPONINI 0.03* 0.03* 0.04*    Recent Labs  Lab 09/03/17 1134 09/03/17 1837  TROPIPOC 0.01 0.01    BNP Recent Labs  Lab 09/03/17 1115  BNP 7.1    DDimer No results for input(s): DDIMER in the last 168 hours.   Radiology    Ct Abdomen Pelvis Wo Contrast  Result Date: 09/05/2017 CLINICAL DATA:  57 year old male with history of abdominal pain. Gastroenteritis or colitis is suspected. EXAM: CT ABDOMEN AND PELVIS WITHOUT CONTRAST TECHNIQUE: Multidetector CT imaging of the abdomen and pelvis was performed following the standard protocol without IV contrast. COMPARISON:  No priors. FINDINGS: Lower chest: Linear areas of scarring or atelectasis are noted in the lower lobes of the lungs bilaterally. Hepatobiliary: Diffuse low attenuation  throughout the visualized hepatic parenchyma, indicative of severe hepatic steatosis. No definite cystic or solid hepatic lesions are confidently identified on today's noncontrast CT examination. There is a gallstone in the region of the neck of the gallbladder or cystic duct. The gallbladder is only moderately distended, but the gallbladder wall appears thickened and there are extensive surrounding inflammatory changes, concerning for an acute cholecystitis. Pancreas: No definite pancreatic mass or peripancreatic fluid or inflammatory changes are noted on today's noncontrast CT examination. Spleen: Unremarkable. Adrenals/Urinary Tract: Unenhanced appearance of the kidneys and bilateral adrenal glands is normal. No hydroureteronephrosis. No nephrolithiasis. Urinary bladder is normal in appearance. Stomach/Bowel: Unenhanced appearance of the stomach is normal. No pathologic dilatation of small bowel or colon. Inflammatory changes are noted adjacent to the second portion of the duodenum, likely related to the acute process in the gallbladder. Numerous colonic diverticulae are noted, without surrounding inflammatory changes to suggest an acute diverticulitis at this time. Inflammatory changes are also noted adjacent to the hepatic flexure of the colon, also likely from the gallbladder. The appendix is not confidently identified and may be surgically absent. Regardless, there are no inflammatory changes noted adjacent to the cecum to suggest the presence of an acute appendicitis at this time. Vascular/Lymphatic: Aortic atherosclerosis. No lymphadenopathy noted in the abdomen or pelvis. Reproductive: Prostate gland and seminal vesicles are unremarkable in appearance. Other: Trace volume of ascites.  No pneumoperitoneum. Musculoskeletal: There are no aggressive appearing lytic or blastic lesions noted in the visualized portions of the skeleton. IMPRESSION: 1. Findings are highly concerning for an acute cholecystitis. This  could be confirmed with right upper quadrant ultrasound. 2. Severe hepatic steatosis. 3. Aortic atherosclerosis. 4. Trace volume of ascites, likely reactive. These results were called by telephone at the time of interpretation on 09/05/2017 at 6:50 pm to Nurse Ray for Dr. Bonnielee Haff, who verbally acknowledged these results. Aortic Atherosclerosis (ICD10-I70.0). Electronically Signed   By: Vinnie Langton M.D.   On: 09/05/2017 18:51   Nm Hepato W/eject Fract  Result Date: 09/06/2017 CLINICAL DATA:  RIGHT upper quadrant pain. EXAM: NUCLEAR MEDICINE HEPATOBILIARY IMAGING TECHNIQUE: Sequential images of the abdomen were obtained out to 60 minutes following intravenous administration of radiopharmaceutical. RADIOPHARMACEUTICALS:  5.2 mCi Tc-67m  Choletec IV and 3 mg morphine COMPARISON:  CT abdomen dated 09/05/2017. FINDINGS: Prompt uptake and biliary excretion of activity by the liver is seen. Biliary activity passes into small bowel, consistent with patent common bile duct. No gallbladder activity visualized  indicating acute cholecystitis. IMPRESSION: Findings are consistent with acute cholecystitis. Electronically Signed   By: Franki Cabot M.D.   On: 09/06/2017 11:44   Cardiac Studies   NM Stress Test 09/04/2017 1. Fixed medium-sized, moderate intensity mid to apical anteroseptal, mid to apical inferoseptal, and true apex perfusion defect.  Prior infarction involving septum versus LBBB artifact.  No significant ischemia.  2. EF 39% with septal dyskinesis.   Transthoracic Echocardiogram 08/27/2017  - Left ventricle: The cavity size was mildly dilated. Wall   thickness was normal. Systolic function was severely reduced. The   estimated ejection fraction was in the range of 25% to 30%.   Diffuse hypokinesis. Doppler parameters are consistent with   abnormal left ventricular relaxation (grade 1 diastolic   dysfunction). - Mitral valve: There was mild regurgitation. - Left atrium: The atrium was mildly  dilated. - Right ventricle: The cavity size was mildly dilated. - Pulmonary arteries: Systolic pressure was mildly increased. PA   peak pressure: 34 mm Hg (S).  Patient Profile     57 y.o. male who presented with acute onset RUQ abdominal pain and subsequently found to have acute cholecystitis. Surgery is onboard and wanting to to take the patient for a cholecystectomy is cleared from a cardiology standpoint.  Assessment & Plan    Acute Cholecystitis  - HIDA scan on 09/06/2017 consistent with acute cholecystitis  - Surgery onboard, awaiting cardiology clearance prior to cholecystectomy  - Continuing Zosyn   Atypical Chest Pain HFrEF - Left Heart Cath in 2010 without obstructive CAD - Echocardiogram on 08/27/17 with worsening LVEF from 40-45% in 2015 to 25-30%. Follow-up NM stress testing illustrated a LVEF of 39% without evidence of ischemia but a fixed medium-sized, moderate intensity mid to apical anteroseptal, mid to apical inferoseptal, and true apex perfusion defect - Troponin negative x 3  - EKG consistent with prior illustrating a LBBB - Currently stable.  - Continue Carvedilol 25 mg BID - Lasix and Lisinopril being held due to elevated creatinine  HTN - Continue Carvedilol 25 mg BID  - Holding Lisinopril due to AKI  HLD - Continue Simvastatin  AKI - Creatinine up to 1.61 (Baseline 1.3) - Holding lasix and lisinopril   OSA - Continue CPAP QHS  For questions or updates, please contact [facility] Please consult [email or web address] for contact info under Cardiology/STEMI.     Signed, Ina Homes, MD  09/07/2017, 9:06 AM    Attending Note:   The patient was seen and examined.  Agree with assessment and plan as noted above.  Changes made to the above note as needed.  Patient seen and independently examined with Ina Homes, MD .   We discussed all aspects of the encounter. I agree with the assessment and plan as stated above.  1.  Chronic systolic congestive  heart failure: This is in the setting of left bundle branch block. I personally reviewed the echocardiogram from last week and compared it to the echocardiogram from 2017.  The ejection fraction might have fallen slightly but certainly not to large degree.  The fall of  ejection fraction may just be due to the left bundle branch block.  The patient now presents with acute cholecystitis.  Not had any episodes of angina.  He is not had any worsening heart failure symptoms.  I think that he absolutely needs to have his cholecystectomy done fairly urgently and we can evaluate the very mild fall and his ejection fraction at a later time.  He is  not having any acute symptoms that make me think that this is an acute coronary syndrome which is caused his ejection fraction to fall slightly.  I've discussed this with Dr. Kieth Brightly who agrees.   The patient is at low - moderate risk for CV complication with surgery .    I have spent a total of 40 minutes with patient reviewing hospital  notes , telemetry, EKGs, labs and examining patient as well as establishing an assessment and plan that was discussed with the patient. > 50% of time was spent in direct patient care.    Thayer Headings, Brooke Bonito., MD, [facility] 09/07/2017, 10:10 AM [address]  [place] Pager [phone]

## 2017-09-07 NOTE — Progress Notes (Signed)
Patient ID: Nicholas Gray, male   DOB: 07/01/1960, 57 y.o.   MRN: [id number]       Subjective: Patient doesn't feel great this morning.  Having RUQ pain.  Wanting to know the plan.  Objective: Vital signs in last 24 hours: Temp:  [97.7 F (36.5 C)-99 F (37.2 C)] 99 F (37.2 C) (06/03 0557) Pulse Rate:  [73-87] 87 (06/03 0557) Resp:  [20] 20 (06/03 0557) BP: (90-166)/(57-68) 130/68 (06/03 0557) SpO2:  [94 %-98 %] 94 % (06/03 0557) Weight:  [118.8 kg (261 lb 14.4 oz)] 118.8 kg (261 lb 14.4 oz) (06/03 0557) Last BM Date: 09/05/17  Intake/Output from previous day: 06/02 0701 - 06/03 0700 In: 860 [P.O.:660; IV Piggyback:200] Out: -  Intake/Output this shift: No intake/output data recorded.  PE: Heart: regular Lungs: CTAB Abd: soft, mild RLQ tenderness, but greatest in RUQ with some voluntary guarding.  Some BS noted.  obese  Lab Results:  Recent Labs    09/06/17 0532 09/07/17 0408  WBC 12.7* 10.8*  HGB 10.9* 11.1*  HCT 32.2* 32.3*  PLT 148* 155   BMET Recent Labs    09/06/17 0532 09/07/17 0408  NA 131* 129*  K 4.0 4.0  CL 97* 98*  CO2 24 23  GLUCOSE 115* 106*  BUN 21* 16  CREATININE 1.72* 1.61*  CALCIUM 8.4* 8.1*   PT/INR No results for input(s): LABPROT, INR in the last 72 hours. CMP     Component Value Date/Time   NA 129 (L) 09/07/2017 0408   K 4.0 09/07/2017 0408   CL 98 (L) 09/07/2017 0408   CO2 23 09/07/2017 0408   GLUCOSE 106 (H) 09/07/2017 0408   BUN 16 09/07/2017 0408   CREATININE 1.61 (H) 09/07/2017 0408   CALCIUM 8.1 (L) 09/07/2017 0408   PROT 6.6 09/07/2017 0408   ALBUMIN 3.0 (L) 09/07/2017 0408   AST 128 (H) 09/07/2017 0408   ALT 140 (H) 09/07/2017 0408   ALKPHOS 72 09/07/2017 0408   BILITOT 1.7 (H) 09/07/2017 0408   GFRNONAA 46 (L) 09/07/2017 0408   GFRAA 53 (L) 09/07/2017 0408   Lipase     Component Value Date/Time   LIPASE 25 09/04/2017 0902       Studies/Results: Ct Abdomen Pelvis Wo Contrast  Result Date:  09/05/2017 CLINICAL DATA:  57 year old male with history of abdominal pain. Gastroenteritis or colitis is suspected. EXAM: CT ABDOMEN AND PELVIS WITHOUT CONTRAST TECHNIQUE: Multidetector CT imaging of the abdomen and pelvis was performed following the standard protocol without IV contrast. COMPARISON:  No priors. FINDINGS: Lower chest: Linear areas of scarring or atelectasis are noted in the lower lobes of the lungs bilaterally. Hepatobiliary: Diffuse low attenuation throughout the visualized hepatic parenchyma, indicative of severe hepatic steatosis. No definite cystic or solid hepatic lesions are confidently identified on today's noncontrast CT examination. There is a gallstone in the region of the neck of the gallbladder or cystic duct. The gallbladder is only moderately distended, but the gallbladder wall appears thickened and there are extensive surrounding inflammatory changes, concerning for an acute cholecystitis. Pancreas: No definite pancreatic mass or peripancreatic fluid or inflammatory changes are noted on today's noncontrast CT examination. Spleen: Unremarkable. Adrenals/Urinary Tract: Unenhanced appearance of the kidneys and bilateral adrenal glands is normal. No hydroureteronephrosis. No nephrolithiasis. Urinary bladder is normal in appearance. Stomach/Bowel: Unenhanced appearance of the stomach is normal. No pathologic dilatation of small bowel or colon. Inflammatory changes are noted adjacent to the second portion of the duodenum, likely related to  the acute process in the gallbladder. Numerous colonic diverticulae are noted, without surrounding inflammatory changes to suggest an acute diverticulitis at this time. Inflammatory changes are also noted adjacent to the hepatic flexure of the colon, also likely from the gallbladder. The appendix is not confidently identified and may be surgically absent. Regardless, there are no inflammatory changes noted adjacent to the cecum to suggest the presence of  an acute appendicitis at this time. Vascular/Lymphatic: Aortic atherosclerosis. No lymphadenopathy noted in the abdomen or pelvis. Reproductive: Prostate gland and seminal vesicles are unremarkable in appearance. Other: Trace volume of ascites.  No pneumoperitoneum. Musculoskeletal: There are no aggressive appearing lytic or blastic lesions noted in the visualized portions of the skeleton. IMPRESSION: 1. Findings are highly concerning for an acute cholecystitis. This could be confirmed with right upper quadrant ultrasound. 2. Severe hepatic steatosis. 3. Aortic atherosclerosis. 4. Trace volume of ascites, likely reactive. These results were called by telephone at the time of interpretation on 09/05/2017 at 6:50 pm to Nurse Ray for Dr. Bonnielee Haff, who verbally acknowledged these results. Aortic Atherosclerosis (ICD10-I70.0). Electronically Signed   By: Vinnie Langton M.D.   On: 09/05/2017 18:51   Nm Hepato W/eject Fract  Result Date: 09/06/2017 CLINICAL DATA:  RIGHT upper quadrant pain. EXAM: NUCLEAR MEDICINE HEPATOBILIARY IMAGING TECHNIQUE: Sequential images of the abdomen were obtained out to 60 minutes following intravenous administration of radiopharmaceutical. RADIOPHARMACEUTICALS:  5.2 mCi Tc-71m  Choletec IV and 3 mg morphine COMPARISON:  CT abdomen dated 09/05/2017. FINDINGS: Prompt uptake and biliary excretion of activity by the liver is seen. Biliary activity passes into small bowel, consistent with patent common bile duct. No gallbladder activity visualized indicating acute cholecystitis. IMPRESSION: Findings are consistent with acute cholecystitis. Electronically Signed   By: Franki Cabot M.D.   On: 09/06/2017 11:44    Anti-infectives: Anti-infectives (From admission, onward)   Start     Dose/Rate Route Frequency Ordered Stop   09/05/17 2000  piperacillin-tazobactam (ZOSYN) IVPB 3.375 g     3.375 g 12.5 mL/hr over 240 Minutes Intravenous Every 8 hours 09/05/17 1908          Assessment/Plan Chronic systolic CHF HTN OSA AKI  Acute cholecystitis -on zosyn, cont for now.  WBC normalized to 10.8 today. -LFTs beginning to trend up c/w cholecystitis and clinical exam indicative of cholecystitis. -will await cards evaluation to determine if he can have lap chole or if he will need perc chole drain placed.  FEN - NPO VTE - Lovenox/SCDs ID - Zosyn   LOS: 0 days    Henreitta Cea , [facility] 09/07/2017, 9:02 AM Pager: [phone]

## 2017-09-07 NOTE — Anesthesia Postprocedure Evaluation (Signed)
Anesthesia Post Note  Patient: Nicholas Gray  Procedure(s) Performed: LAPAROSCOPIC CHOLECYSTECTOMY (N/A )     Patient location during evaluation: PACU Anesthesia Type: General Level of consciousness: awake and alert Pain management: pain level controlled Vital Signs Assessment: post-procedure vital signs reviewed and stable Respiratory status: spontaneous breathing, nonlabored ventilation and respiratory function stable Cardiovascular status: blood pressure returned to baseline and stable Postop Assessment: no apparent nausea or vomiting Anesthetic complications: no    Last Vitals:  Vitals:   09/07/17 1750 09/07/17 1800  BP: (!) 98/45 (!) 98/48  Pulse: 74 75  Resp: 12 15  Temp:  36.5 C  SpO2: 95% 97%    Last Pain:  Vitals:   09/07/17 1800  TempSrc:   PainSc: Asleep                 Lynda Rainwater

## 2017-09-07 NOTE — Op Note (Signed)
PATIENT:  Nicholas Gray  57 y.o. male  PRE-OPERATIVE DIAGNOSIS:  Acute cholecystitis  POST-OPERATIVE DIAGNOSIS:  Acute cholecystitis  PROCEDURE:  Procedure(s): LAPAROSCOPIC CHOLECYSTECTOMY   SURGEON:  Surgeon(s): Ashdon Gillson, Arta Bruce, MD  ASSISTANT: Jackson Latino, P.A   ANESTHESIA:   local and general  Indications for procedure: KEMUEL BUCHMANN is a 57 y.o. male with symptoms of Abdominal pain and Nausea and vomiting consistent with gallbladder disease, Confirmed by Ultrasound, CT and HIDA.  Description of procedure: The patient was brought into the operative suite, placed supine. Anesthesia was administered with endotracheal tube. Patient was strapped in place and foot board was secured. All pressure points were offloaded by foam padding. The patient was prepped and draped in the usual sterile fashion.  A small incision was made to the right of the umbilicus. A 45mm trocar was inserted into the peritoneal cavity with optical entry. Pneumoperitoneum was applied with high flow low pressure. 2 23mm trocars were placed in the RUQ. A 69mm trocar was placed in the subxiphoid space. 30 ml marcaine was infused to the subxiphoid space and lateral upper right abdomen in the preperitoneal spaces. Next the patient was placed in reverse trendelenberg.   The gallbladder was inflamed, red and tense.  The gallbladder was retracted cephalad and lateral. On retraction a large amount of pus was expressed from the gallbladder and evacuated. The peritoneum was reflected off the infundibulum working lateral to medial. The cystic duct and cystic artery were identified and further dissection revealed a critical view. The cystic artery were doubly clipped and ligated. The cystic duct was necrotic and on manipulation it tore. 2 0 vicryl endoloops were used to ensnare the cystic duct.  The gallbladder was removed off the liver bed with cautery. The Gallbladder was placed in a specimen bag. The gallbladder  fossa was irrigated and hemostasis was applied with cautery. The gallbladder was removed via the 75mm trocar. The fascial defect was closed with interrupted 0 vicryl suture via laparoscopic trans-fascial suture passer. A 19Fr blake drain was brought into the abdomen and out the lateral right port. The drain was then placed in the dissection area beneath the liver and along the right abdominal wall. Pneumoperitoneum was removed, all trocar were removed. All incisions were closed with 4-0 monocryl subcuticular stitch. The patient woke from anesthesia and was brought to PACU in stable condition. All counts were correct  Findings: inflamed gallbladder with pus in gallbladder, critical view obtained, cystic duct necrotic and closed with endoloops  Specimen: gallbladder  Blood loss: 100 ml  Local anesthesia: 100 ml marcaine  Complications: none  PLAN OF CARE: Admit to inpatient   PATIENT DISPOSITION:  PACU - hemodynamically stable.  Images:         Gurney Maxin, M.D. General, Bariatric, & Minimally Invasive Surgery [facility]

## 2017-09-07 NOTE — Plan of Care (Signed)
  Problem: Health Behavior/Discharge Planning: Goal: Ability to manage health-related needs will improve Outcome: Progressing   

## 2017-09-07 NOTE — Anesthesia Preprocedure Evaluation (Addendum)
Anesthesia Evaluation  Patient identified by MRN, date of birth, ID band Patient awake    Reviewed: Allergy & Precautions, NPO status , Patient's Chart, lab work & pertinent test results, reviewed documented beta blocker date and time   Airway Mallampati: III  TM Distance: >3 FB Neck ROM: Full    Dental no notable dental hx. (+) Teeth Intact   Pulmonary sleep apnea and Continuous Positive Airway Pressure Ventilation , former smoker,    Pulmonary exam normal breath sounds clear to auscultation       Cardiovascular hypertension, Pt. on medications and Pt. on home beta blockers +CHF  Normal cardiovascular exam+ dysrhythmias  Rhythm:Regular Rate:Bradycardia  Dilated Cardiomyopathy- LVEF 25-30%  Global hypokinesis on Echo 08/27/2017 EKG- Sinus Bradycardia w/ LBBB   Neuro/Psych negative neurological ROS  negative psych ROS   GI/Hepatic GERD  Medicated and Controlled,Elevated LFT's Acute Cholecystitis   Endo/Other  Hyperlipidemia Gout Obesity  Renal/GU Renal InsufficiencyRenal disease  negative genitourinary   Musculoskeletal  (+) Arthritis , Osteoarthritis,    Abdominal   Peds  Hematology  (+) anemia , Lovenox- last dose   Anesthesia Other Findings   Reproductive/Obstetrics                           Anesthesia Physical Anesthesia Plan  ASA: III  Anesthesia Plan: General   Post-op Pain Management:    Induction: Intravenous, Rapid sequence and Cricoid pressure planned  PONV Risk Score and Plan: 4 or greater and Ondansetron, Dexamethasone and Treatment may vary due to age or medical condition  Airway Management Planned: Oral ETT  Additional Equipment:   Intra-op Plan:   Post-operative Plan: Extubation in OR and Possible Post-op intubation/ventilation  Informed Consent: I have reviewed the patients History and Physical, chart, labs and discussed the procedure including the risks,  benefits and alternatives for the proposed anesthesia with the patient or authorized representative who has indicated his/her understanding and acceptance.   Dental advisory given  Plan Discussed with: CRNA and Surgeon  Anesthesia Plan Comments:         Anesthesia Quick Evaluation

## 2017-09-07 NOTE — Transfer of Care (Signed)
Immediate Anesthesia Transfer of Care Note  Patient: Nicholas Gray  Procedure(s) Performed: LAPAROSCOPIC CHOLECYSTECTOMY (N/A )  Patient Location: PACU  Anesthesia Type:General  Level of Consciousness: drowsy  Airway & Oxygen Therapy: Patient Spontanous Breathing and Patient connected to face mask oxygen  Post-op Assessment: Report given to RN and Post -op Vital signs reviewed and stable  Post vital signs: Reviewed and stable  Last Vitals:  Vitals Value Taken Time  BP 95/46 09/07/2017  5:37 PM  Temp 36.4 C 09/07/2017  5:35 PM  Pulse 75 09/07/2017  5:41 PM  Resp 19 09/07/2017  5:41 PM  SpO2 98 % 09/07/2017  5:41 PM  Vitals shown include unvalidated device data.  Last Pain:  Vitals:   09/07/17 1735  TempSrc:   PainSc: Asleep      Patients Stated Pain Goal: 0 (57/89/78 4784)  Complications: No apparent anesthesia complications

## 2017-09-08 ENCOUNTER — Encounter (HOSPITAL_COMMUNITY): Payer: Self-pay | Admitting: General Surgery

## 2017-09-08 DIAGNOSIS — K810 Acute cholecystitis: Secondary | ICD-10-CM | POA: Diagnosis not present

## 2017-09-08 DIAGNOSIS — I5022 Chronic systolic (congestive) heart failure: Secondary | ICD-10-CM | POA: Diagnosis not present

## 2017-09-08 DIAGNOSIS — R079 Chest pain, unspecified: Secondary | ICD-10-CM | POA: Diagnosis not present

## 2017-09-08 DIAGNOSIS — I10 Essential (primary) hypertension: Secondary | ICD-10-CM | POA: Diagnosis not present

## 2017-09-08 DIAGNOSIS — G4733 Obstructive sleep apnea (adult) (pediatric): Secondary | ICD-10-CM | POA: Diagnosis not present

## 2017-09-08 DIAGNOSIS — I447 Left bundle-branch block, unspecified: Secondary | ICD-10-CM | POA: Diagnosis not present

## 2017-09-08 LAB — COMPREHENSIVE METABOLIC PANEL
ALT: 193 U/L — ABNORMAL HIGH (ref 17–63)
AST: 149 U/L — AB (ref 15–41)
Albumin: 2.8 g/dL — ABNORMAL LOW (ref 3.5–5.0)
Alkaline Phosphatase: 84 U/L (ref 38–126)
Anion gap: 11 (ref 5–15)
BUN: 19 mg/dL (ref 6–20)
CHLORIDE: 96 mmol/L — AB (ref 101–111)
CO2: 24 mmol/L (ref 22–32)
Calcium: 8.5 mg/dL — ABNORMAL LOW (ref 8.9–10.3)
Creatinine, Ser: 1.39 mg/dL — ABNORMAL HIGH (ref 0.61–1.24)
GFR, EST NON AFRICAN AMERICAN: 54 mL/min — AB (ref >60)
Glucose, Bld: 185 mg/dL — ABNORMAL HIGH (ref 65–99)
POTASSIUM: 4.4 mmol/L (ref 3.5–5.1)
Sodium: 131 mmol/L — ABNORMAL LOW (ref 135–145)
Total Bilirubin: 1.3 mg/dL — ABNORMAL HIGH (ref 0.3–1.2)
Total Protein: 6.9 g/dL (ref 6.5–8.1)

## 2017-09-08 LAB — CBC
HCT: 32.2 % — ABNORMAL LOW (ref 39.0–52.0)
Hemoglobin: 10.9 g/dL — ABNORMAL LOW (ref 13.0–17.0)
MCH: 31 pg (ref 26.0–34.0)
MCHC: 33.9 g/dL (ref 30.0–36.0)
MCV: 91.5 fL (ref 78.0–100.0)
PLATELETS: 150 10*3/uL (ref 150–400)
RBC: 3.52 MIL/uL — ABNORMAL LOW (ref 4.22–5.81)
RDW: 12.9 % (ref 11.5–15.5)
WBC: 9.8 10*3/uL (ref 4.0–10.5)

## 2017-09-08 MED ORDER — SENNOSIDES-DOCUSATE SODIUM 8.6-50 MG PO TABS
2.0000 | ORAL_TABLET | Freq: Every evening | ORAL | Status: DC | PRN
  Filled 2017-09-08: qty 2

## 2017-09-08 MED ORDER — OXYCODONE HCL 5 MG PO TABS
5.0000 mg | ORAL_TABLET | ORAL | Status: DC | PRN
  Administered 2017-09-08 – 2017-09-10 (×9): 10 mg via ORAL
  Filled 2017-09-08 (×12): qty 2

## 2017-09-08 MED ORDER — ACETAMINOPHEN 500 MG PO TABS
1000.0000 mg | ORAL_TABLET | Freq: Four times a day (QID) | ORAL | Status: DC | PRN
  Administered 2017-09-08 – 2017-09-09 (×3): 1000 mg via ORAL
  Filled 2017-09-08 (×3): qty 2

## 2017-09-08 NOTE — Progress Notes (Addendum)
[facility] PROGRESS NOTE  JON LALL HYQ:[id number] DOB: Oct 14, 1960 DOA: 09/03/2017  PCP: [facility]  Brief History/Interval Summary: 57 year old Caucasian male with a past medical history of dilated cardiomyopathy with a EF of 25 to 30%, hypertension, obstructive sleep apnea, clean coronaries based on cardiac cath in 2010 presented with chest pain.  Patient was hospitalized for further management.  Patient was seen by cardiology.  He had a stress test which was intermediate risk.  He was also noted to have abdominal discomfort and pain.  He underwent ultrasound followed by CT scan and then a HIDA scan.  These tests suggest acute cholecystitis.  General surgery consulted.  Patient underwent laparoscopic cholecystectomy on 6/3.  Seems to be medically stable postoperatively.  Reason for Visit: Chest pain  Consultants: Cardiology.  General surgery.  Procedures:  Stress test  There was no ST segment deviation noted during stress.  This is an intermediate risk study.  The left ventricular ejection fraction is moderately decreased (30-44%).   1. Fixed medium-sized, moderate intensity mid to apical anteroseptal, mid to apical inferoseptal, and true apex perfusion defect.  Prior infarction involving septum versus LBBB artifact.  No significant ischemia.  2. EF 39% with septal dyskinesis.  Intermediate risk study because of low EF.   Laparoscopic cholecystectomy on 6/3.  Antibiotics: Zosyn 6/1  Subjective/Interval History: Patient states that he is feeling well.  Continues to have some pain in the abdomen which he thinks is due to the drain that was placed during surgery yesterday.  Denies any nausea vomiting.  Denies any chest pain or shortness of breath.     ROS: No headaches  Objective:  Vital Signs  Vitals:   09/07/17 1800 09/07/17 1815 09/07/17 1939 09/08/17 0506  BP: (!) 98/48 (!) 113/59 (!) 113/59 125/71  Pulse: 75  72 (!) 54  Resp: 15    15  Temp: 97.7 F (36.5 C)  (!) 97.5 F (36.4 C) 99 F (37.2 C)  TempSrc:   Oral Oral  SpO2: 97%  92% 95%  Weight:    118.3 kg (260 lb 14.4 oz)  Height:        Intake/Output Summary (Last 24 hours) at 09/08/2017 0821 Last data filed at 09/08/2017 0508 Gross per 24 hour  Intake 2382.83 ml  Output 455 ml  Net 1927.83 ml   Filed Weights   09/07/17 0557 09/07/17 1514 09/08/17 0506  Weight: 118.8 kg (261 lb 14.4 oz) 118.8 kg (261 lb 14.4 oz) 118.3 kg (260 lb 14.4 oz)    General appearance: Awake alert.  In no distress. Resp: Clear to auscultation bilaterally.  No wheezing rales or rhonchi. Cardio: S1-S2 is normal regular.  No S3-S4.  No rubs murmurs or bruit GI: Soft.  Mildly tender.  Drain is noted in the right abdomen.  Bowel sounds present but sluggish. Neurologic: No focal deficits  Lab Results:  Data Reviewed: I have personally reviewed following labs and imaging studies  CBC: Recent Labs  Lab 09/04/17 0902 09/05/17 0746 09/06/17 0532 09/07/17 0408 09/08/17 0442  WBC 14.8* 15.2* 12.7* 10.8* 9.8  HGB 13.1 11.5* 10.9* 11.1* 10.9*  HCT 38.3* 34.1* 32.2* 32.3* 32.2*  MCV 91.4 90.7 91.7 90.5 91.5  PLT 199 161 148* 155 952    Basic Metabolic Panel: Recent Labs  Lab 09/04/17 0902 09/05/17 0746 09/06/17 0532 09/07/17 0408 09/08/17 0442  NA 131* 132* 131* 129* 131*  K 4.1 4.1 4.0 4.0 4.4  CL 99* 98* 97* 98* 96*  CO2 23 23 24 23 24   GLUCOSE 127* 111* 115* 106* 185*  BUN 15 19 21* 16 19  CREATININE 1.31* 1.63* 1.72* 1.61* 1.39*  CALCIUM 9.0 8.7* 8.4* 8.1* 8.5*    GFR: Estimated Creatinine Clearance: 74.6 mL/min (A) (by C-G formula based on SCr of 1.39 mg/dL (H)).  Liver Function Tests: Recent Labs  Lab 09/04/17 0902 09/06/17 0532 09/07/17 0408 09/08/17 0442  AST 22 32 128* 149*  ALT 29 35 140* 193*  ALKPHOS 40 68 72 84  BILITOT 1.2 1.2 1.7* 1.3*  PROT 6.9 6.3* 6.6 6.9  ALBUMIN 4.0 3.2* 3.0* 2.8*    Recent Labs  Lab 09/04/17 0902  LIPASE 25    Cardiac Enzymes: Recent Labs  Lab 09/03/17 2210 09/04/17 0206 09/04/17 0359  TROPONINI 0.03* 0.03* 0.04*      Radiology Studies: Nm Hepato W/eject Fract  Result Date: 09/06/2017 CLINICAL DATA:  RIGHT upper quadrant pain. EXAM: NUCLEAR MEDICINE HEPATOBILIARY IMAGING TECHNIQUE: Sequential images of the abdomen were obtained out to 60 minutes following intravenous administration of radiopharmaceutical. RADIOPHARMACEUTICALS:  5.2 mCi Tc-6m  Choletec IV and 3 mg morphine COMPARISON:  CT abdomen dated 09/05/2017. FINDINGS: Prompt uptake and biliary excretion of activity by the liver is seen. Biliary activity passes into small bowel, consistent with patent common bile duct. No gallbladder activity visualized indicating acute cholecystitis. IMPRESSION: Findings are consistent with acute cholecystitis. Electronically Signed   By: Franki Cabot M.D.   On: 09/06/2017 11:44     Medications:  Scheduled: . allopurinol  100 mg Oral BID  . carvedilol  25 mg Oral BID WC  . enoxaparin (LOVENOX) injection  40 mg Subcutaneous Q24H  . famotidine  20 mg Oral Daily  . pantoprazole  40 mg Oral BID AC  . simvastatin  40 mg Oral QHS   Continuous: . lactated ringers 10 mL/hr at 09/08/17 0320  . piperacillin-tazobactam (ZOSYN)  IV 3.375 g (09/08/17 0420)   OFB:PZWCHENIDPOEU, gi cocktail, HYDROcodone-acetaminophen, morphine injection, nitroGLYCERIN, ondansetron (ZOFRAN) IV  Assessment/Plan:   Acute cholecystitis Initially patient's LFTs were normal.  Right upper quadrant ultrasound did not show any biliary disease.  Due to persistent symptoms of CT scan of the abdomen pelvis was done which suggested acute cholecystitis.  General surgery was consulted.  Patient underwent HIDA scan which shows acute cholecystitis.  Patient was cleared by cardiology.  Patient underwent laparoscopic cholecystectomy on 6/3.  Significantly inflamed gallbladder was noted with pus.  Gangrenous changes were noted in the cystic  duct.  Continue Zosyn for now per general surgery.  LFTs are stable.  Patient has a drain due to high risk for biliary leak.  Further management per general surgery.  Mobilize.  Diet has been advanced.  Chronic systolic CHF due to nonischemic cardiomyopathy Patient with mildly elevated troponins.  With history of LBBB. Seen by cardiology. Underwent stress test which was intermediate risk.  Patient cleared by cardiology for surgery.  He appears to be euvolemic.  There was some concern for hypovolemia due to rising creatinine as he was n.p.o. for almost 2 days.  Patient was gently hydrated.  Creatinine has improved.  We will discontinue fluids.  Continue to hold Lasix and ACE inhibitor for now.  Could be resumed if renal function continues to improve.    Acute renal failure Unclear if patient has a history of CKD.  Being climbed from 1.31-1.63 and then 1.72.  Most likely due to hypovolemia as the patient was n.p.o. for 2 to 3 days.  He  was started on gentle IV hydration.  He is making urine.  Creatinine has improved to 1.3 today.  Stop IV fluids to avoid fluid overload.  He is tolerating his diet well.  Recheck labs tomorrow.    Essential hypertension Blood pressure is reasonably well controlled.  Continue with beta-blocker.  ACE inhibitor on hold due to elevated creatinine..  Obstructive sleep apnea CPAP.  DVT Prophylaxis: Lovenox    Code Status: Full code Family Communication: Discussed with patient Disposition Plan: Management as outlined above.  Mobilize.  Incentive spirometry.    LOS: 0 days   [place] [facility] Pager [phone] 09/08/2017, 8:21 AM  If 7PM-7AM, please contact night-coverage at [email or web address], password [facility]

## 2017-09-08 NOTE — Progress Notes (Signed)
RT NOTE:  PT has home CPAP @ bedside. He manages. RT available if needed.

## 2017-09-08 NOTE — Progress Notes (Signed)
Patient ID: Nicholas Gray, male   DOB: Mar 08, 1961, 57 y.o.   MRN: [id number]    1 Day Post-Op  Subjective: Pt is sore today, but feels better.  Tolerating heart healthy diet today.  Hasn't mobilized much yet.    Objective: Vital signs in last 24 hours: Temp:  [97.5 F (36.4 C)-99 F (37.2 C)] 99 F (37.2 C) (06/04 0506) Pulse Rate:  [54-80] 54 (06/04 0506) Resp:  [12-20] 15 (06/04 0506) BP: (95-129)/(45-71) 125/71 (06/04 0506) SpO2:  [92 %-98 %] 95 % (06/04 0506) Weight:  [118.3 kg (260 lb 14.4 oz)-118.8 kg (261 lb 14.4 oz)] 118.3 kg (260 lb 14.4 oz) (06/04 0506) Last BM Date: 09/05/17  Intake/Output from previous day: 06/03 0701 - 06/04 0700 In: 2382.8 [P.O.:360; I.V.:1852.8; IV Piggyback:150] Out: 455 [Urine:300; Drains:55; Blood:100] Intake/Output this shift: No intake/output data recorded.  PE: Abd: soft, appropriately tender, less so than yesterday, +BS, ND, obese, incisions c/d/i.  JP drain in place with serosang output.  Lab Results:  Recent Labs    09/07/17 0408 09/08/17 0442  WBC 10.8* 9.8  HGB 11.1* 10.9*  HCT 32.3* 32.2*  PLT 155 150   BMET Recent Labs    09/07/17 0408 09/08/17 0442  NA 129* 131*  K 4.0 4.4  CL 98* 96*  CO2 23 24  GLUCOSE 106* 185*  BUN 16 19  CREATININE 1.61* 1.39*  CALCIUM 8.1* 8.5*   PT/INR No results for input(s): LABPROT, INR in the last 72 hours. CMP     Component Value Date/Time   NA 131 (L) 09/08/2017 0442   K 4.4 09/08/2017 0442   CL 96 (L) 09/08/2017 0442   CO2 24 09/08/2017 0442   GLUCOSE 185 (H) 09/08/2017 0442   BUN 19 09/08/2017 0442   CREATININE 1.39 (H) 09/08/2017 0442   CALCIUM 8.5 (L) 09/08/2017 0442   PROT 6.9 09/08/2017 0442   ALBUMIN 2.8 (L) 09/08/2017 0442   AST 149 (H) 09/08/2017 0442   ALT 193 (H) 09/08/2017 0442   ALKPHOS 84 09/08/2017 0442   BILITOT 1.3 (H) 09/08/2017 0442   GFRNONAA 54 (L) 09/08/2017 0442   GFRAA >60 09/08/2017 0442   Lipase     Component Value Date/Time   LIPASE  25 09/04/2017 0902       Studies/Results: Nm Hepato W/eject Fract  Result Date: 09/06/2017 CLINICAL DATA:  RIGHT upper quadrant pain. EXAM: NUCLEAR MEDICINE HEPATOBILIARY IMAGING TECHNIQUE: Sequential images of the abdomen were obtained out to 60 minutes following intravenous administration of radiopharmaceutical. RADIOPHARMACEUTICALS:  5.2 mCi Tc-24m  Choletec IV and 3 mg morphine COMPARISON:  CT abdomen dated 09/05/2017. FINDINGS: Prompt uptake and biliary excretion of activity by the liver is seen. Biliary activity passes into small bowel, consistent with patent common bile duct. No gallbladder activity visualized indicating acute cholecystitis. IMPRESSION: Findings are consistent with acute cholecystitis. Electronically Signed   By: Franki Cabot M.D.   On: 09/06/2017 11:44    Anti-infectives: Anti-infectives (From admission, onward)   Start     Dose/Rate Route Frequency Ordered Stop   09/05/17 2000  piperacillin-tazobactam (ZOSYN) IVPB 3.375 g     3.375 g 12.5 mL/hr over 240 Minutes Intravenous Every 8 hours 09/05/17 1908         Assessment/Plan Chronic systolic CHF HTN OSA AKI  Acute cholecystitis, POD 1, s/p lap chole by Dr. Kieth Brightly on 6-3 -on zosyn for 24hrs post op -heart healthy diet -mobilize today -due to inflamed cystic duct, endoloops were placed.  JP drain  placed and will need to stay in at least a week.  This was discussed and explained to the patient. -oral pain meds  FEN - heart healthy diet VTE - Lovenox/SCDs ID - Zosyn 6/2 -->6/4     LOS: 0 days    Henreitta Cea , [facility] 09/08/2017, 9:35 AM Pager: [phone]

## 2017-09-08 NOTE — Progress Notes (Addendum)
Progress Note  Patient Name: Nicholas Gray Date of Encounter: 09/08/2017  Primary Cardiologist: Minus Breeding, MD   Subjective   Status post cholecystectomy yesterday.  Denies shortness of breath or chest pain.  Inpatient Medications    Scheduled Meds: . allopurinol  100 mg Oral BID  . carvedilol  25 mg Oral BID WC  . enoxaparin (LOVENOX) injection  40 mg Subcutaneous Q24H  . famotidine  20 mg Oral Daily  . pantoprazole  40 mg Oral BID AC  . simvastatin  40 mg Oral QHS   Continuous Infusions: . lactated ringers 10 mL/hr at 09/08/17 0320  . piperacillin-tazobactam (ZOSYN)  IV Stopped (09/08/17 0837)   PRN Meds: acetaminophen, gi cocktail, HYDROcodone-acetaminophen, morphine injection, nitroGLYCERIN, ondansetron (ZOFRAN) IV   Vital Signs    Vitals:   09/07/17 1800 09/07/17 1815 09/07/17 1939 09/08/17 0506  BP: (!) 98/48 (!) 113/59 (!) 113/59 125/71  Pulse: 75  72 (!) 54  Resp: 15   15  Temp: 97.7 F (36.5 C)  (!) 97.5 F (36.4 C) 99 F (37.2 C)  TempSrc:   Oral Oral  SpO2: 97%  92% 95%  Weight:    260 lb 14.4 oz (118.3 kg)  Height:        Intake/Output Summary (Last 24 hours) at 09/08/2017 0912 Last data filed at 09/08/2017 0508 Gross per 24 hour  Intake 2382.83 ml  Output 455 ml  Net 1927.83 ml   Filed Weights   09/07/17 0557 09/07/17 1514 09/08/17 0506  Weight: 261 lb 14.4 oz (118.8 kg) 261 lb 14.4 oz (118.8 kg) 260 lb 14.4 oz (118.3 kg)    Telemetry    Sinus bradycardia in low 50s- Personally Reviewed  ECG    None  Physical Exam   GEN: No acute distress.   Neck: No JVD Cardiac: RRR, no murmurs, rubs, or gallops.  Respiratory: Clear to auscultation bilaterally. GI: Soft, non-distended, surgical dressing MS: No edema; No deformity. Neuro:  Nonfocal  Psych: Normal affect   Labs    Chemistry Recent Labs  Lab 09/06/17 0532 09/07/17 0408 09/08/17 0442  NA 131* 129* 131*  K 4.0 4.0 4.4  CL 97* 98* 96*  CO2 24 23 24   GLUCOSE 115*  106* 185*  BUN 21* 16 19  CREATININE 1.72* 1.61* 1.39*  CALCIUM 8.4* 8.1* 8.5*  PROT 6.3* 6.6 6.9  ALBUMIN 3.2* 3.0* 2.8*  AST 32 128* 149*  ALT 35 140* 193*  ALKPHOS 68 72 84  BILITOT 1.2 1.7* 1.3*  GFRNONAA 42* 46* 54*  GFRAA 49* 53* >60  ANIONGAP 10 8 11      Hematology Recent Labs  Lab 09/06/17 0532 09/07/17 0408 09/08/17 0442  WBC 12.7* 10.8* 9.8  RBC 3.51* 3.57* 3.52*  HGB 10.9* 11.1* 10.9*  HCT 32.2* 32.3* 32.2*  MCV 91.7 90.5 91.5  MCH 31.1 31.1 31.0  MCHC 33.9 34.4 33.9  RDW 12.8 12.5 12.9  PLT 148* 155 150    Cardiac Enzymes Recent Labs  Lab 09/03/17 2210 09/04/17 0206 09/04/17 0359  TROPONINI 0.03* 0.03* 0.04*    Recent Labs  Lab 09/03/17 1134 09/03/17 1837  TROPIPOC 0.01 0.01    BNP Recent Labs  Lab 09/03/17 1115  BNP 7.1     Radiology    Nm Hepato W/eject Fract  Result Date: 09/06/2017 CLINICAL DATA:  RIGHT upper quadrant pain. EXAM: NUCLEAR MEDICINE HEPATOBILIARY IMAGING TECHNIQUE: Sequential images of the abdomen were obtained out to 60 minutes following intravenous administration of radiopharmaceutical.  RADIOPHARMACEUTICALS:  5.2 mCi Tc-38m  Choletec IV and 3 mg morphine COMPARISON:  CT abdomen dated 09/05/2017. FINDINGS: Prompt uptake and biliary excretion of activity by the liver is seen. Biliary activity passes into small bowel, consistent with patent common bile duct. No gallbladder activity visualized indicating acute cholecystitis. IMPRESSION: Findings are consistent with acute cholecystitis. Electronically Signed   By: Franki Cabot M.D.   On: 09/06/2017 11:44    Cardiac Studies   NM Stress Test 09/04/2017 1. Fixed medium-sized, moderate intensity mid to apical anteroseptal, mid to apical inferoseptal, and true apex perfusion defect. Prior infarction involving septum versus LBBB artifact. No significant ischemia.  2. EF 39% with septal dyskinesis.   Transthoracic Echocardiogram 08/27/2017  - Left ventricle: The cavity size was  mildly dilated. Wall thickness was normal. Systolic function was severely reduced. The estimated ejection fraction was in the range of 25% to 30%. Diffuse hypokinesis. Doppler parameters are consistent with abnormal left ventricular relaxation (grade 1 diastolic dysfunction). - Mitral valve: There was mild regurgitation. - Left atrium: The atrium was mildly dilated. - Right ventricle: The cavity size was mildly dilated. - Pulmonary arteries: Systolic pressure was mildly increased. PA peak pressure: 34 mm Hg (S).   Patient Profile     57 y.o. male with chronic systolic heart failure, hypertension, obstructive sleep apnea and mild non-obstructive CAD by cardiac cath in 2010 presented with acute onset RUQ abdominal pain and subsequently found to have acute cholecystitis.   Assessment & Plan    1.  Acute on chronic systolic heart failure Echocardiogram on 08/27/17 with worsening LVEF from 40-45% in 2015 to 25-30%. Follow-up NM stress testing illustrated a LVEF of 39% without evidence of ischemia but a fixed medium-sized, moderate intensity mid to apical anteroseptal, mid to apical inferoseptal, and true apex perfusion defect. -Dr. Acie Fredrickson reviewed her prior echocardiogram and felt that the fall of  ejection fraction may just be due to the left bundle branch block. -Lasix and lisinopril on hold due to AKI.  Continue Coreg 25 mg twice daily.  He is bradycardic today.  Follow closely. -He was taking lasix as needed prior to presentation.  2.  Hypertension -Blood pressure stable.  3.  AKI -Creatinine improving with hydration and holding Lasix and lisinopril.  4.  Acute cholecystitis -Status post cholecystectomy.  Hold statin for now due to elevated LFTs.  Will sign off.  Call with questions.  Patient has appointment with Dr. [name] this Friday 09/11/17 to establish advanced heart failure service.  Discharged on PRN Lasix.  Continue to hold lisinopril until office visit.  For  questions or updates, please contact [place] Please consult [email or web address] for contact info under Cardiology/STEMI.      SignedCrista Luria [place], PA  09/08/2017, 9:12 AM    Attending Note:   The patient was seen and examined.  Agree with assessment and plan as noted above.  Changes made to the above note as needed.  Patient seen and independently examined with Robbie Lis, PA .   We discussed all aspects of the encounter. I agree with the assessment and plan as stated above.  1.  Acute on chronic systolic congestive heart failure: The patient's ejection fraction has fallen slightly from his previous echocardiogram.  He has not had any episodes of chest pain.  He presented with abdominal pain was found to have acute cholecystitis and had laparoscopic cholecystectomy yesterday.  He is overall feeling better.  He has a left bundle branch block.  He will need  further evaluation as an outpatient.  2.  Acute kidney insufficiency: The patient's creatinine is improving.  We have been holding Lasix and lisinopril.  3.  Hypertension: Blood pressure remains stable.    I have spent a total of 40 minutes with patient reviewing hospital  notes , telemetry, EKGs, labs and examining patient as well as establishing an assessment and plan that was discussed with the patient. > 50% of time was spent in direct patient care.    Thayer Headings, Brooke Bonito., MD, [facility] 09/08/2017, 10:19 AM [address],  [place] Pager [phone]

## 2017-09-08 NOTE — Plan of Care (Signed)
  Problem: Education: Goal: Knowledge of General Education information will improve Outcome: Progressing   

## 2017-09-09 ENCOUNTER — Inpatient Hospital Stay (HOSPITAL_COMMUNITY): Payer: 59

## 2017-09-09 DIAGNOSIS — Z79899 Other long term (current) drug therapy: Secondary | ICD-10-CM | POA: Diagnosis not present

## 2017-09-09 DIAGNOSIS — K219 Gastro-esophageal reflux disease without esophagitis: Secondary | ICD-10-CM | POA: Diagnosis present

## 2017-09-09 DIAGNOSIS — I420 Dilated cardiomyopathy: Secondary | ICD-10-CM | POA: Diagnosis not present

## 2017-09-09 DIAGNOSIS — J95812 Postprocedural air leak: Secondary | ICD-10-CM | POA: Diagnosis not present

## 2017-09-09 DIAGNOSIS — Z87891 Personal history of nicotine dependence: Secondary | ICD-10-CM | POA: Diagnosis not present

## 2017-09-09 DIAGNOSIS — K832 Perforation of bile duct: Secondary | ICD-10-CM | POA: Diagnosis not present

## 2017-09-09 DIAGNOSIS — K9189 Other postprocedural complications and disorders of digestive system: Secondary | ICD-10-CM | POA: Diagnosis not present

## 2017-09-09 DIAGNOSIS — K810 Acute cholecystitis: Secondary | ICD-10-CM | POA: Diagnosis not present

## 2017-09-09 DIAGNOSIS — N179 Acute kidney failure, unspecified: Secondary | ICD-10-CM | POA: Diagnosis present

## 2017-09-09 DIAGNOSIS — G4733 Obstructive sleep apnea (adult) (pediatric): Secondary | ICD-10-CM

## 2017-09-09 DIAGNOSIS — Z6841 Body Mass Index (BMI) 40.0 and over, adult: Secondary | ICD-10-CM | POA: Diagnosis not present

## 2017-09-09 DIAGNOSIS — K760 Fatty (change of) liver, not elsewhere classified: Secondary | ICD-10-CM | POA: Diagnosis present

## 2017-09-09 DIAGNOSIS — I252 Old myocardial infarction: Secondary | ICD-10-CM | POA: Diagnosis not present

## 2017-09-09 DIAGNOSIS — K5730 Diverticulosis of large intestine without perforation or abscess without bleeding: Secondary | ICD-10-CM | POA: Diagnosis not present

## 2017-09-09 DIAGNOSIS — K5900 Constipation, unspecified: Secondary | ICD-10-CM | POA: Diagnosis present

## 2017-09-09 DIAGNOSIS — I10 Essential (primary) hypertension: Secondary | ICD-10-CM | POA: Diagnosis not present

## 2017-09-09 DIAGNOSIS — E669 Obesity, unspecified: Secondary | ICD-10-CM | POA: Diagnosis present

## 2017-09-09 DIAGNOSIS — I5022 Chronic systolic (congestive) heart failure: Secondary | ICD-10-CM | POA: Diagnosis not present

## 2017-09-09 DIAGNOSIS — K8000 Calculus of gallbladder with acute cholecystitis without obstruction: Secondary | ICD-10-CM | POA: Diagnosis not present

## 2017-09-09 DIAGNOSIS — R1011 Right upper quadrant pain: Secondary | ICD-10-CM | POA: Diagnosis not present

## 2017-09-09 DIAGNOSIS — I272 Pulmonary hypertension, unspecified: Secondary | ICD-10-CM | POA: Diagnosis present

## 2017-09-09 DIAGNOSIS — R079 Chest pain, unspecified: Secondary | ICD-10-CM | POA: Diagnosis not present

## 2017-09-09 DIAGNOSIS — K839 Disease of biliary tract, unspecified: Secondary | ICD-10-CM | POA: Diagnosis not present

## 2017-09-09 DIAGNOSIS — K838 Other specified diseases of biliary tract: Secondary | ICD-10-CM | POA: Diagnosis not present

## 2017-09-09 DIAGNOSIS — I110 Hypertensive heart disease with heart failure: Secondary | ICD-10-CM | POA: Diagnosis present

## 2017-09-09 DIAGNOSIS — Y836 Removal of other organ (partial) (total) as the cause of abnormal reaction of the patient, or of later complication, without mention of misadventure at the time of the procedure: Secondary | ICD-10-CM | POA: Diagnosis not present

## 2017-09-09 DIAGNOSIS — I5023 Acute on chronic systolic (congestive) heart failure: Secondary | ICD-10-CM | POA: Diagnosis not present

## 2017-09-09 DIAGNOSIS — E861 Hypovolemia: Secondary | ICD-10-CM | POA: Diagnosis present

## 2017-09-09 DIAGNOSIS — R945 Abnormal results of liver function studies: Secondary | ICD-10-CM | POA: Diagnosis not present

## 2017-09-09 DIAGNOSIS — E785 Hyperlipidemia, unspecified: Secondary | ICD-10-CM | POA: Diagnosis present

## 2017-09-09 LAB — CBC
HCT: 32.4 % — ABNORMAL LOW (ref 39.0–52.0)
HEMOGLOBIN: 10.9 g/dL — AB (ref 13.0–17.0)
MCH: 31.3 pg (ref 26.0–34.0)
MCHC: 33.6 g/dL (ref 30.0–36.0)
MCV: 93.1 fL (ref 78.0–100.0)
PLATELETS: 182 10*3/uL (ref 150–400)
RBC: 3.48 MIL/uL — ABNORMAL LOW (ref 4.22–5.81)
RDW: 12.9 % (ref 11.5–15.5)
WBC: 11.8 10*3/uL — ABNORMAL HIGH (ref 4.0–10.5)

## 2017-09-09 LAB — COMPREHENSIVE METABOLIC PANEL
ALBUMIN: 2.6 g/dL — AB (ref 3.5–5.0)
ALK PHOS: 87 U/L (ref 38–126)
ALT: 252 U/L — ABNORMAL HIGH (ref 17–63)
ANION GAP: 6 (ref 5–15)
AST: 160 U/L — ABNORMAL HIGH (ref 15–41)
BUN: 22 mg/dL — ABNORMAL HIGH (ref 6–20)
CALCIUM: 8 mg/dL — AB (ref 8.9–10.3)
CHLORIDE: 98 mmol/L — AB (ref 101–111)
CO2: 26 mmol/L (ref 22–32)
Creatinine, Ser: 1.34 mg/dL — ABNORMAL HIGH (ref 0.61–1.24)
GFR calc Af Amer: >60 mL/min (ref >60)
GFR calc non Af Amer: 57 mL/min — ABNORMAL LOW (ref >60)
GLUCOSE: 115 mg/dL — AB (ref 65–99)
POTASSIUM: 4 mmol/L (ref 3.5–5.1)
Sodium: 130 mmol/L — ABNORMAL LOW (ref 135–145)
Total Bilirubin: 0.8 mg/dL (ref 0.3–1.2)
Total Protein: 6.6 g/dL (ref 6.5–8.1)

## 2017-09-09 MED ORDER — MUPIROCIN 2 % EX OINT
TOPICAL_OINTMENT | Freq: Two times a day (BID) | CUTANEOUS | Status: DC
  Administered 2017-09-09 – 2017-09-11 (×5): via NASAL
  Administered 2017-09-11 – 2017-09-12 (×2): 1 via NASAL
  Administered 2017-09-12: 10:00:00 via NASAL
  Filled 2017-09-09 (×3): qty 22

## 2017-09-09 MED ORDER — DOCUSATE SODIUM 100 MG PO CAPS
100.0000 mg | ORAL_CAPSULE | Freq: Two times a day (BID) | ORAL | Status: DC
  Administered 2017-09-09 – 2017-09-11 (×5): 100 mg via ORAL
  Filled 2017-09-09 (×7): qty 1

## 2017-09-09 MED ORDER — TECHNETIUM TC 99M MEBROFENIN IV KIT
5.0000 | PACK | Freq: Once | INTRAVENOUS | Status: AC | PRN
  Administered 2017-09-09: 5 via INTRAVENOUS

## 2017-09-09 NOTE — Plan of Care (Signed)
°  Problem: Coping: °Goal: Level of anxiety will decrease °Outcome: Progressing °  °

## 2017-09-09 NOTE — Progress Notes (Signed)
Patient ID: Nicholas Gray, male   DOB: [birth year], 57 y.o.   MRN: [id number]  PROGRESS NOTE    ELDRIGE PITKIN  GYI:[id number] DOB: [birth year] DOA: 09/03/2017 PCP: [facility]   Brief Narrative: 57 year old Caucasian male with a past medical history of dilated cardiomyopathy with a EF of 25 to 30%, hypertension, obstructive sleep apnea, clean coronaries based on cardiac cath in 2010 presented with chest pain.  Patient was hospitalized for further management.  Patient was seen by cardiology.  He had a stress test which was intermediate risk.  He was also noted to have abdominal discomfort and pain.  He underwent ultrasound followed by CT scan and then a HIDA scan.  These tests suggest acute cholecystitis.  General surgery consulted.  Patient underwent laparoscopic cholecystectomy on 6/3.  Seems to be medically stable postoperatively.     Assessment & Plan:   Principal Problem:   Chest pain, rule out acute myocardial infarction Active Problems:   Essential hypertension   Dilated cardiomyopathy (HCC)   LBBB (left bundle branch block)   OSA (obstructive sleep apnea)   Chronic systolic CHF (congestive heart failure) (HCC) Acute gangrenous  cholecystitis  Acute cholecystitis Initially patient's LFTs were normal.  Right upper quadrant ultrasound did not show any biliary disease.  Due to persistent symptoms of CT scan of the abdomen pelvis was done which suggested acute cholecystitis.  General surgery was consulted.  Patient underwent HIDA scan which shows acute cholecystitis.  Patient was cleared by cardiology.  Patient underwent laparoscopic cholecystectomy on 6/3.  Significantly inflamed gallbladder was noted with pus.  Gangrenous changes were noted in the cystic duct.  Continue Zosyn for now per general surgery.  LFTs are stable.  Patient has a drain due to high risk for biliary leak.  Further management per general surgery.  Mobilize.  Diet has been advanced.  Patient is  showing minimal effort and getting up out of bed and mobilizing.  I have encouraged patient this is necessary for recovery.  Chronic systolic CHF due to nonischemic cardiomyopathy Patient with mildly elevated troponins.  With history of LBBB. Seen by cardiology. Underwent stress test which was intermediate risk.  Patient cleared by cardiology for surgery.  He appears to be euvolemic.  There was some concern for hypovolemia due to rising creatinine as he was n.p.o. for almost 2 days.  Patient was gently hydrated.  Creatinine has improved.  We will discontinue fluids.  Continue to hold Lasix and ACE inhibitor for now.  Could be resumed if renal function continues to improve.    Acute renal failure Unclear if patient has a history of CKD.  Being climbed from 1.31-1.63 and then 1.72.  Most likely due to hypovolemia as the patient was n.p.o. for 2 to 3 days.  He was started on gentle IV hydration.  He is making urine.  Creatinine has improved to 1.3 today.  Stop IV fluids to avoid fluid overload.  He is tolerating his diet well.  Recheck labs tomorrow.    Essential hypertension Blood pressure is reasonably well controlled.  Continue with beta-blocker.  ACE inhibitor on hold due to elevated creatinine..  Obstructive sleep apnea CPAP.    DVT prophylaxis: Lovenox  Code Status: Full code  Family Communication: None  Disposition Plan: Per general surgery   Consultants:   General surgery and cardiology  Procedures: Status post ex lap on 09/07/2017  Stress test  There was no ST segment deviation noted during stress.  This is an  intermediate risk study.  The left ventricular ejection fraction is moderately decreased (30-44%).  1. Fixed medium-sized, moderate intensity mid to apical anteroseptal, mid to apical inferoseptal, and true apex perfusion defect. Prior infarction involving septum versus LBBB artifact. No significant ischemia.  2. EF 39% with septal dyskinesis.  Intermediate risk  study because of low EF.       Subjective: Feeling very anxious patient does not feel that he should have to get himself out of bed.  He feels that the staff is not getting him out of bed enough and not coming to check on him enough.  Objective: Vitals:   09/08/17 1440 09/08/17 1549 09/08/17 2100 09/09/17 0500  BP: 125/66 (!) 121/51 127/60 124/78  Pulse: (!) 56 (!) 57 (!) 56 62  Resp: (!) 23 (!) 28 (!) 27 (!) 24  Temp:  97.9 F (36.6 C) 98.7 F (37.1 C) 97.8 F (36.6 C)  TempSrc:  Oral Oral Oral  SpO2: 96% 96% 97% 98%  Weight:    117.8 kg (259 lb 12.8 oz)  Height:        Intake/Output Summary (Last 24 hours) at 09/09/2017 1117 Last data filed at 09/09/2017 0645 Gross per 24 hour  Intake 488.67 ml  Output 1695 ml  Net -1206.33 ml   Filed Weights   09/07/17 1514 09/08/17 0506 09/09/17 0500  Weight: 118.8 kg (261 lb 14.4 oz) 118.3 kg (260 lb 14.4 oz) 117.8 kg (259 lb 12.8 oz)    Examination:  General exam: Appears calm and comfortable  Respiratory system: Clear to auscultation. Respiratory effort normal. Cardiovascular system: S1 & S2 heard, RRR. No JVD, murmurs, rubs, gallops or clicks. No pedal edema. Gastrointestinal system: Abdomen is nondistended, soft and nontender. No organomegaly or masses felt. Normal bowel sounds heard. Central nervous system: Alert and oriented. No focal neurological deficits. Extremities: Symmetric 5 x 5 power. Skin: No rashes, lesions or ulcers Psychiatry: Judgement and insight appear normal. Mood & affect appropriate.     Data Reviewed: I have personally reviewed following labs and imaging studies  CBC: Recent Labs  Lab 09/05/17 0746 09/06/17 0532 09/07/17 0408 09/08/17 0442 09/09/17 0453  WBC 15.2* 12.7* 10.8* 9.8 11.8*  HGB 11.5* 10.9* 11.1* 10.9* 10.9*  HCT 34.1* 32.2* 32.3* 32.2* 32.4*  MCV 90.7 91.7 90.5 91.5 93.1  PLT 161 148* 155 150 324   Basic Metabolic Panel: Recent Labs  Lab 09/05/17 0746 09/06/17 0532  09/07/17 0408 09/08/17 0442 09/09/17 0453  NA 132* 131* 129* 131* 130*  K 4.1 4.0 4.0 4.4 4.0  CL 98* 97* 98* 96* 98*  CO2 23 24 23 24 26   GLUCOSE 111* 115* 106* 185* 115*  BUN 19 21* 16 19 22*  CREATININE 1.63* 1.72* 1.61* 1.39* 1.34*  CALCIUM 8.7* 8.4* 8.1* 8.5* 8.0*   GFR: Estimated Creatinine Clearance: 77.3 mL/min (A) (by C-G formula based on SCr of 1.34 mg/dL (H)). Liver Function Tests: Recent Labs  Lab 09/04/17 0902 09/06/17 0532 09/07/17 0408 09/08/17 0442 09/09/17 0453  AST 22 32 128* 149* 160*  ALT 29 35 140* 193* 252*  ALKPHOS 40 68 72 84 87  BILITOT 1.2 1.2 1.7* 1.3* 0.8  PROT 6.9 6.3* 6.6 6.9 6.6  ALBUMIN 4.0 3.2* 3.0* 2.8* 2.6*   Recent Labs  Lab 09/04/17 0902  LIPASE 25   No results for input(s): AMMONIA in the last 168 hours. Coagulation Profile: No results for input(s): INR, PROTIME in the last 168 hours. Cardiac Enzymes: Recent Labs  Lab  09/03/17 2210 09/04/17 0206 09/04/17 0359  TROPONINI 0.03* 0.03* 0.04*   BNP (last 3 results) No results for input(s): PROBNP in the last 8760 hours. HbA1C: No results for input(s): HGBA1C in the last 72 hours. CBG: No results for input(s): GLUCAP in the last 168 hours. Lipid Profile: No results for input(s): CHOL, HDL, LDLCALC, TRIG, CHOLHDL, LDLDIRECT in the last 72 hours. Thyroid Function Tests: No results for input(s): TSH, T4TOTAL, FREET4, T3FREE, THYROIDAB in the last 72 hours. Anemia Panel: No results for input(s): VITAMINB12, FOLATE, FERRITIN, TIBC, IRON, RETICCTPCT in the last 72 hours. Sepsis Labs: No results for input(s): PROCALCITON, LATICACIDVEN in the last 168 hours.  Recent Results (from the past 240 hour(s))  Surgical pcr screen     Status: Abnormal   Collection Time: 09/07/17  2:00 PM  Result Value Ref Range Status   MRSA, PCR NEGATIVE NEGATIVE Final   Staphylococcus aureus POSITIVE (A) NEGATIVE Final    Comment: (NOTE) The Xpert SA Assay (FDA approved for NASAL specimens in  patients 28 years of age and older), is one component of a comprehensive surveillance program. It is not intended to diagnose infection nor to guide or monitor treatment. Performed at [facility], [place] [address] [place], [place] [address]          Radiology Studies: No results found.      Scheduled Meds: . allopurinol  100 mg Oral BID  . carvedilol  25 mg Oral BID WC  . docusate sodium  100 mg Oral BID  . enoxaparin (LOVENOX) injection  40 mg Subcutaneous Q24H  . famotidine  20 mg Oral Daily  . pantoprazole  40 mg Oral BID AC  . simvastatin  40 mg Oral QHS   Continuous Infusions:   LOS: 0 days    Time spent: 35 minutes    Orey Moure A, MD [facility] Pager [phone]  If 7PM-7AM, please contact night-coverage [email or web address] Password [email or web address] 09/09/2017, 11:17 AM

## 2017-09-09 NOTE — Progress Notes (Signed)
Pt has home CPAP and places self on and off when ready. RT will continue to monitor.

## 2017-09-09 NOTE — Progress Notes (Signed)
    Pt has done well. No cardiac complications from lap chole.  Will sign off Call for questions He will follow up with Dr. Percival Spanish and the CHF clinic  He has an appt with in a week with the CHF clinic. Will need to cancel that appt - I think he needs to have some time to recover from this lap chole before initiating further work up for his CHF    Mertie Moores, MD  09/09/2017 8:42 AM    [facility] [address],  [place], [place]  [address] Pager [phone] Phone: [phone]; Fax: [phone]

## 2017-09-09 NOTE — Evaluation (Signed)
Physical Therapy Evaluation Patient Details Name: Nicholas Gray MRN: [id number] DOB: [birth year] Today's Date: 09/09/2017   History of Present Illness  Admit with Acute gangrenous cholecystitis needing cholecystectomy.  Hx CHF, HTN.   Clinical Impression  Pt admitted with above diagnosis. Pt currently with functional limitations due to the deficits listed below (see PT Problem List). Pt did well but was limited by orthostatic BPs today.  Should progress well once this has resolved. Pt will benefit from skilled PT to increase their independence and safety with mobility to allow discharge to the venue listed below.    Orthostatic BPs  Supine 124/78, 64 bpm  Sitting 115/73, 68 bpm  Standing 81/42, 68 bpm  Standing after 3 min 91/62, 67 bpm   On departure, 119/79 with HR 61 bpm.  Of note pt was symptomatic with report of lightheaded feeling with change in position. Nurse aware.   Follow Up Recommendations Supervision - Intermittent;No PT follow up    Equipment Recommendations  Other (comment)(TBA but most likely wont need anything)    Recommendations for Other Services       Precautions / Restrictions Precautions Precautions: Fall Precaution Comments: JP drain Restrictions Weight Bearing Restrictions: No      Mobility  Bed Mobility Overal bed mobility: Needs Assistance Bed Mobility: Supine to Sit     Supine to sit: Supervision;Min guard     General bed mobility comments: Pt lightheaded with orthostatic BPS  Transfers Overall transfer level: Needs assistance Equipment used: Rolling walker (2 wheeled) Transfers: Sit to/from Stand Sit to Stand: Min guard            Ambulation/Gait Ambulation/Gait assistance: Min guard Ambulation Distance (Feet): 50 Feet Assistive device: Rolling walker (2 wheeled) Gait Pattern/deviations: Step-through pattern;Decreased stride length   Gait velocity interpretation: 1.31 - 2.62 ft/sec, indicative of limited community  ambulator General Gait Details: Guarded gait due to lightheaded due to orthostasis.  Pt no LOB with RW.   Stairs            Wheelchair Mobility    Modified Rankin (Stroke Patients Only)       Balance Overall balance assessment: Needs assistance Sitting-balance support: No upper extremity supported;Feet supported Sitting balance-Leahy Scale: Good     Standing balance support: Bilateral upper extremity supported;During functional activity Standing balance-Leahy Scale: Poor Standing balance comment: relies on support of UEs today due to slight dizziness per pt                             Pertinent Vitals/Pain Pain Assessment: 0-10 Pain Score: 3  Pain Location: abdomen, drain site Pain Descriptors / Indicators: Grimacing;Guarding Pain Intervention(s): Limited activity within patient's tolerance;Monitored during session;Premedicated before session;Repositioned    Home Living Family/patient expects to be discharged to:: Private residence Living Arrangements: Spouse/significant other;Children Available Help at Discharge: Family;Available 24 hours/day(wife is right BKA and does ambulate with prosthesis) Type of Home: House Home Access: Ramped entrance     Home Layout: One level Home Equipment: Wheelchair - [employer] - 2 wheels;Cane - single point;Bedside commode;Tub bench Additional Comments: 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     Prior Function Level of Independence: Independent               Hand Dominance        Extremity/Trunk Assessment   Upper Extremity Assessment  Upper Extremity Assessment: Defer to OT evaluation    Lower Extremity Assessment Lower Extremity Assessment: Generalized weakness    Cervical / Trunk Assessment Cervical / Trunk Assessment: Normal  Communication   Communication: No difficulties  Cognition Arousal/Alertness:  Awake/alert Behavior During Therapy: WFL for tasks assessed/performed Overall Cognitive Status: Within Functional Limits for tasks assessed                                        General Comments      Exercises     Assessment/Plan    PT Assessment Patient needs continued PT services  PT Problem List Decreased strength;Decreased activity tolerance;Decreased balance;Decreased mobility;Decreased knowledge of use of DME;Decreased safety awareness;Decreased knowledge of precautions;Cardiopulmonary status limiting activity;Pain       PT Treatment Interventions DME instruction;Gait training;Functional mobility training;Therapeutic activities;Therapeutic exercise;Balance training;Patient/family education    PT Goals (Current goals can be found in the Care Plan section)  Acute Rehab PT Goals Patient Stated Goal: to go home PT Goal Formulation: With patient Time For Goal Achievement: 09/23/17 Potential to Achieve Goals: Good    Frequency Min 3X/week   Barriers to discharge        Co-evaluation               AM-PAC PT "6 Clicks" Daily Activity  Outcome Measure Difficulty turning over in bed (including adjusting bedclothes, sheets and blankets)?: None Difficulty moving from lying on back to sitting on the side of the bed? : None Difficulty sitting down on and standing up from a chair with arms (e.g., wheelchair, bedside commode, etc,.)?: A Little Help needed moving to and from a bed to chair (including a wheelchair)?: A Little Help needed walking in hospital room?: A Little Help needed climbing 3-5 steps with a railing? : A Little 6 Click Score: 20    End of Session Equipment Utilized During Treatment: Gait belt Activity Tolerance: Patient limited by fatigue(limited by dizziness) Patient left: in chair;with call bell/phone within reach Nurse Communication: Mobility status PT Visit Diagnosis: Unsteadiness on feet (R26.81);Dizziness and giddiness  (R42);Muscle weakness (generalized) (M62.81)    Time: 3875-6433 PT Time Calculation (min) (ACUTE ONLY): 23 min   Charges:   PT Evaluation $PT Eval Moderate Complexity: 1 Mod PT Treatments $Gait Training: 8-22 mins   PT G Codes:        Rosebud Koenen,PT Acute Rehabilitation [phone] (pager)   Denice Paradise 09/09/2017, 12:10 PM

## 2017-09-09 NOTE — Progress Notes (Signed)
Patient ID: EAGAN SHIFFLETT, male   DOB: September 22, 1960, 57 y.o.   MRN: [id number]    2 Days Post-Op  Subjective: Patient had a pain issue trying to get to the bathroom this morning.  Required IV morphine once he got back to bed.  Eating well.  Hasn't not mobilized at all yet.  Still having quite a bit of pain.  Objective: Vital signs in last 24 hours: Temp:  [97.8 F (36.6 C)-98.7 F (37.1 C)] 97.8 F (36.6 C) (06/05 0500) Pulse Rate:  [56-62] 62 (06/05 0500) Resp:  [23-28] 24 (06/05 0500) BP: (121-127)/(51-78) 124/78 (06/05 0500) SpO2:  [96 %-98 %] 98 % (06/05 0500) Weight:  [117.8 kg (259 lb 12.8 oz)] 117.8 kg (259 lb 12.8 oz) (06/05 0500) Last BM Date: 09/05/17  Intake/Output from previous day: 06/04 0701 - 06/05 0700 In: 606.7 [P.O.:380; I.V.:226.7] Out: 1695 [Urine:1575; Drains:120] Intake/Output this shift: No intake/output data recorded.  PE: Heart: regular Lungs: CTAB Abd: soft, obese, drain output increasing.  Just drained about 60-70cc more of darker bloody, slightly bile tinged fluid, and it was just emptied about 2.5 hrs ago.  Output increased to 120cc yesterday.  Incisions arec/d/i  Lab Results:  Recent Labs    09/08/17 0442 09/09/17 0453  WBC 9.8 11.8*  HGB 10.9* 10.9*  HCT 32.2* 32.4*  PLT 150 182   BMET Recent Labs    09/08/17 0442 09/09/17 0453  NA 131* 130*  K 4.4 4.0  CL 96* 98*  CO2 24 26  GLUCOSE 185* 115*  BUN 19 22*  CREATININE 1.39* 1.34*  CALCIUM 8.5* 8.0*   PT/INR No results for input(s): LABPROT, INR in the last 72 hours. CMP     Component Value Date/Time   NA 130 (L) 09/09/2017 0453   K 4.0 09/09/2017 0453   CL 98 (L) 09/09/2017 0453   CO2 26 09/09/2017 0453   GLUCOSE 115 (H) 09/09/2017 0453   BUN 22 (H) 09/09/2017 0453   CREATININE 1.34 (H) 09/09/2017 0453   CALCIUM 8.0 (L) 09/09/2017 0453   PROT 6.6 09/09/2017 0453   ALBUMIN 2.6 (L) 09/09/2017 0453   AST 160 (H) 09/09/2017 0453   ALT 252 (H) 09/09/2017 0453   ALKPHOS  87 09/09/2017 0453   BILITOT 0.8 09/09/2017 0453   GFRNONAA 57 (L) 09/09/2017 0453   GFRAA >60 09/09/2017 0453   Lipase     Component Value Date/Time   LIPASE 25 09/04/2017 0902       Studies/Results: No results found.  Anti-infectives: Anti-infectives (From admission, onward)   Start     Dose/Rate Route Frequency Ordered Stop   09/05/17 2000  piperacillin-tazobactam (ZOSYN) IVPB 3.375 g     3.375 g 12.5 mL/hr over 240 Minutes Intravenous Every 8 hours 09/05/17 1908 09/08/17 2359       Assessment/Plan Chronic systolic CHF HTN OSA AKI  Acute gangrenous cholecystitis, POD 2, s/p lap chole by Dr. Kieth Brightly on 6-3 -zosyn completed -heart healthy diet -mobilize today!!!!!!! -due to inflamed cystic duct, endoloops were placed.  JP drain placed and will need to stay in at least a week.  This was discussed and explained to the patient.  Drain output has increased and is dark bloody, but has some slight bile tinge possibly to it.  His AST and ALT has actually gone up today, but TB has normalized. Will need to monitor his LFTs in am (patient isn't stable for DC today.  He hasn't mobilized well enough, his pain isn't controlled with  just oral pain medications) and follow drain output.   -oral pain meds  FEN -heart healthy diet VTE -Lovenox/SCDs ID -Zosyn 6/2 -->6/4    LOS: 0 days    Henreitta Cea , [facility] 09/09/2017, 9:05 AM Pager: [phone]

## 2017-09-10 DIAGNOSIS — K9189 Other postprocedural complications and disorders of digestive system: Secondary | ICD-10-CM

## 2017-09-10 DIAGNOSIS — R945 Abnormal results of liver function studies: Secondary | ICD-10-CM

## 2017-09-10 DIAGNOSIS — R1010 Upper abdominal pain, unspecified: Secondary | ICD-10-CM

## 2017-09-10 DIAGNOSIS — R1011 Right upper quadrant pain: Secondary | ICD-10-CM

## 2017-09-10 DIAGNOSIS — K838 Other specified diseases of biliary tract: Secondary | ICD-10-CM

## 2017-09-10 LAB — COMPREHENSIVE METABOLIC PANEL
ALBUMIN: 2.7 g/dL — AB (ref 3.5–5.0)
ALT: 206 U/L — ABNORMAL HIGH (ref 17–63)
ANION GAP: 9 (ref 5–15)
AST: 99 U/L — ABNORMAL HIGH (ref 15–41)
Alkaline Phosphatase: 80 U/L (ref 38–126)
BUN: 18 mg/dL (ref 6–20)
CHLORIDE: 98 mmol/L — AB (ref 101–111)
CO2: 23 mmol/L (ref 22–32)
Calcium: 8.1 mg/dL — ABNORMAL LOW (ref 8.9–10.3)
Creatinine, Ser: 1.14 mg/dL (ref 0.61–1.24)
GFR calc Af Amer: >60 mL/min (ref >60)
GFR calc non Af Amer: >60 mL/min (ref >60)
GLUCOSE: 99 mg/dL (ref 65–99)
POTASSIUM: 4.2 mmol/L (ref 3.5–5.1)
Sodium: 130 mmol/L — ABNORMAL LOW (ref 135–145)
Total Bilirubin: 1 mg/dL (ref 0.3–1.2)
Total Protein: 6.3 g/dL — ABNORMAL LOW (ref 6.5–8.1)

## 2017-09-10 LAB — CBC
HCT: 33.2 % — ABNORMAL LOW (ref 39.0–52.0)
HEMOGLOBIN: 10.9 g/dL — AB (ref 13.0–17.0)
MCH: 30.1 pg (ref 26.0–34.0)
MCHC: 32.8 g/dL (ref 30.0–36.0)
MCV: 91.7 fL (ref 78.0–100.0)
Platelets: 215 10*3/uL (ref 150–400)
RBC: 3.62 MIL/uL — ABNORMAL LOW (ref 4.22–5.81)
RDW: 13 % (ref 11.5–15.5)
WBC: 9.5 10*3/uL (ref 4.0–10.5)

## 2017-09-10 NOTE — Progress Notes (Signed)
[facility]/Trauma Progress Note  3 Days Post-Op   Assessment/Plan Chronic systolic CHF HTN OSA AKI  Acute gangrenous cholecystitis,  - s/p lap chole by Dr. Kieth Brightly, 09/07/17 Bile leak seen on HIDA - GI consult pending for stent placement -  LFT's trending down, Tbili WNL  FEN -NPO pending GI consult VTE -Lovenox/SCDs ID -Zosyn6/2 -->6/4 Foley: none Follow up: TBD  Plan: GI consult pending for stent placement, encourage ambulation   LOS: 1 day    Subjective: CC; abdominal pain  Pain improving. No nausea or vomiting. Having BM's. No issues overnight. Discussed GI consult and stent placement.   Objective: Vital signs in last 24 hours: Temp:  [97.8 F (36.6 C)-98.7 F (37.1 C)] 98.3 F (36.8 C) (06/06 0454) Pulse Rate:  [63-74] 74 (06/06 0454) Resp:  [18-20] 18 (06/06 0454) BP: (81-138)/(42-82) 136/74 (06/06 0454) SpO2:  [94 %-97 %] 97 % (06/06 0454) Last BM Date: 09/05/17  Intake/Output from previous day: 06/05 0701 - 06/06 0700 In: 300 [P.O.:300] Out: 711 [Urine:300; Drains:410; Stool:1] Intake/Output this shift: Total I/O In: -  Out: 100 [Drains:100]  PE: Gen:  Alert, NAD, pleasant, cooperative Pulm:  Rate and effort normal Abd: Soft, ND, very mild generalized TTP, no guarding, no signs of peritonitis, +BS, incisions with glue intact are without signs of infection, drain with dark green drainage output 410cc in last 24hrs Skin: no rashes noted, warm and dry   Anti-infectives: Anti-infectives (From admission, onward)   Start     Dose/Rate Route Frequency Ordered Stop   09/05/17 2000  piperacillin-tazobactam (ZOSYN) IVPB 3.375 g     3.375 g 12.5 mL/hr over 240 Minutes Intravenous Every 8 hours 09/05/17 1908 09/08/17 2359      Lab Results:  Recent Labs    09/09/17 0453 09/10/17 0357  WBC 11.8* 9.5  HGB 10.9* 10.9*  HCT 32.4* 33.2*  PLT 182 215   BMET Recent Labs    09/09/17 0453 09/10/17 0357  NA 130* 130*  K 4.0  4.2  CL 98* 98*  CO2 26 23  GLUCOSE 115* 99  BUN 22* 18  CREATININE 1.34* 1.14  CALCIUM 8.0* 8.1*   PT/INR No results for input(s): LABPROT, INR in the last 72 hours. CMP     Component Value Date/Time   NA 130 (L) 09/10/2017 0357   K 4.2 09/10/2017 0357   CL 98 (L) 09/10/2017 0357   CO2 23 09/10/2017 0357   GLUCOSE 99 09/10/2017 0357   BUN 18 09/10/2017 0357   CREATININE 1.14 09/10/2017 0357   CALCIUM 8.1 (L) 09/10/2017 0357   PROT 6.3 (L) 09/10/2017 0357   ALBUMIN 2.7 (L) 09/10/2017 0357   AST 99 (H) 09/10/2017 0357   ALT 206 (H) 09/10/2017 0357   ALKPHOS 80 09/10/2017 0357   BILITOT 1.0 09/10/2017 0357   GFRNONAA >60 09/10/2017 0357   GFRAA >60 09/10/2017 0357   Lipase     Component Value Date/Time   LIPASE 25 09/04/2017 0902    Studies/Results: Nm Hepatobiliary Liver Func  Result Date: 09/09/2017 CLINICAL DATA:  57 year old male status post cholecystectomy on 09/06/2017 with persistent right upper quadrant pain. EXAM: NUCLEAR MEDICINE HEPATOBILIARY IMAGING TECHNIQUE: Sequential images of the abdomen were obtained out to 60 minutes following intravenous administration of radiopharmaceutical. RADIOPHARMACEUTICALS:  5 mCi Tc-68m  Choletec IV COMPARISON:  09/06/2017. FINDINGS: Prompt uptake and biliary excretion of activity by the liver is seen. Activity is visualized in the gallbladder fossa, followed by eventual spillage of activity beneath the  right lobe of the liver into the upper right peritoneal cavity. Activity also extends into the small bowel via the common bile duct. IMPRESSION: 1. Study is positive for bile leak, as above. These results will be called to the ordering clinician or representative by the Radiologist Assistant, and communication documented in the PACS or zVision Dashboard. Electronically Signed   By: Vinnie Langton M.D.   On: 09/09/2017 18:13      Kalman Drape , [facility] 09/10/2017, 9:46 AM  Pager: [phone] Mon-Wed,  Friday 7:00am-4:30pm Thurs 7am-11:30am  Consults: [phone]

## 2017-09-10 NOTE — H&P (View-Only) (Signed)
Referring Provider: [facility] Primary Care Physician:  [facility] Primary Gastroenterologist:   Dr. Vira Agar  Reason for Consultation:   Bile leak    ASSESSMENT AND PLAN:    74. 57 yo male, POD #3 lap cholecystectomy with JP drain placement for acute cholecystitis. Gallbladder significantly inflamed with gangrene. Increased JP drainage yesterday, found on HIDA last night to have bile duct leak. - Liver chemistries improving. Bili normal. WBC normal. Afebrile. Completed Zosyn last night, not on antibiotics at present.  - Will need ERCP with stent placement. I explained the procedure including risks / benefits to the patient and he consents to proceed. I will coordinate timing with Dr. Loletha Carrow / Anesthesia  2. Acute on chronic systolic heart failure, EF 25-30%, down from 40-45% in 2015. Cardiology felt decline in EF was possible due to left BBB. He was cleared for lap chole this admission.   3. AKI, improving  4. History of colon polyps, tubular adenomas without HGD. He was due for surveillance colonoscopy this past January (Dr. Vira Agar).  -Reminded him to follow up with GI . 5.  Elevated liver chemistries  HPI: Nicholas Gray is a 57 y.o. male with hx of dilated cardiomyopathy with EF of 25-30%, HTN, OSA who underwent laparoscopic cholecystetomy 3 days ago for acute cholecystitis . Gallbladder was significantly inflamed. Path c/w acute inflammation and necrosis. JP drain placed due to risk of bile leak. Yesterday drain output increased, transaminases increased. HIDA positive for bile leak.  Currently his abdominal pain is tolerable. He is aware of the bile leak and need for stent placement. No other GI complaints. Bowel normally move okay but a little constipated at present.   Past Medical History:  Diagnosis Date  . Arthritis    gout  . Cardiomyopathy    EF 30-40% (Normal coronaries cath 2010).  Echo 2015 EF 40%  . CHF (congestive heart failure)  ([place])   . Colonic polyp   . Diverticulosis of colon   . GERD (gastroesophageal reflux disease)   . History of chicken pox   . HLD (hyperlipidemia)    recent  . HTN (hypertension)    recent  . Sleep apnea    does use CPAP.      Past Surgical History:  Procedure Laterality Date  . CARDIAC CATHETERIZATION  2010  . CHOLECYSTECTOMY N/A 09/07/2017   Procedure: LAPAROSCOPIC CHOLECYSTECTOMY;  Surgeon: Kinsinger, Arta Bruce, MD;  Location: [facility];  Service: General;  Laterality: N/A;  LAPAROSCOPIC CHOLECYSTECTOMY  . TONSILLECTOMY AND ADENOIDECTOMY      Prior to Admission medications   Medication Sig Start Date End Date Taking? Authorizing Provider  allopurinol (ZYLOPRIM) 100 MG tablet Take 100 mg by mouth 2 (two) times daily.    Yes [provider]  carvedilol (COREG) 25 MG tablet TAKE (1) TABLET BY MOUTH TWICE DAILY WITH A MEAL. 01/09/17  Yes Minus Breeding, MD  furosemide (LASIX) 20 MG tablet Take 20 mg by mouth daily as needed (swelling).   Yes [provider]  lisinopril (PRINIVIL,ZESTRIL) 10 MG tablet TAKE 2 TABLETS BY MOUTH TWICE DAILY. 01/09/17  Yes Minus Breeding, MD  ranitidine (ZANTAC) 150 MG tablet Take 150 mg by mouth daily.   Yes [provider]  simvastatin (ZOCOR) 40 MG tablet Take 40 mg by mouth at bedtime.   Yes [provider]  pantoprazole (PROTONIX) 40 MG tablet Take 1 tablet (40 mg total) by mouth 2 (two) times daily before a meal for 14 days.  09/05/17 09/19/17  Bonnielee Haff, MD    Current Facility-Administered Medications  Medication Dose Route Frequency Provider Last Rate Last Dose  . acetaminophen (TYLENOL) tablet 1,000 mg  1,000 mg Oral Q6H PRN Saverio Danker, PA-C   1,000 mg at 09/09/17 0842  . allopurinol (ZYLOPRIM) tablet 100 mg  100 mg Oral BID Kinsinger, Arta Bruce, MD   100 mg at 09/09/17 2119  . carvedilol (COREG) tablet 25 mg  25 mg Oral BID WC Kinsinger, Arta Bruce, MD   25 mg at 09/10/17 0926  . docusate sodium (COLACE)  capsule 100 mg  100 mg Oral BID Phillips Grout, MD   100 mg at 09/09/17 2119  . enoxaparin (LOVENOX) injection 40 mg  40 mg Subcutaneous Q24H Kinsinger, Arta Bruce, MD   40 mg at 09/09/17 2119  . famotidine (PEPCID) tablet 20 mg  20 mg Oral Daily Kinsinger, Arta Bruce, MD   20 mg at 09/10/17 0926  . gi cocktail (Maalox,Lidocaine,Donnatal)  30 mL Oral QID PRN Kinsinger, Arta Bruce, MD   30 mL at 09/04/17 0132  . morphine 4 MG/ML injection 2 mg  2 mg Intravenous Q2H PRN Kinsinger, Arta Bruce, MD   2 mg at 09/09/17 [phone]  . mupirocin ointment (BACTROBAN) 2 %   Nasal BID Derrill Kay A, MD      . nitroGLYCERIN (NITROSTAT) SL tablet 0.4 mg  0.4 mg Sublingual Q5 min PRN Kinsinger, Arta Bruce, MD   0.4 mg at 09/03/17 2018  . ondansetron (ZOFRAN) injection 4 mg  4 mg Intravenous Q6H PRN Kinsinger, Arta Bruce, MD      . oxyCODONE (Oxy IR/ROXICODONE) immediate release tablet 5-10 mg  5-10 mg Oral Q4H PRN Saverio Danker, PA-C   10 mg at 09/10/17 0608  . pantoprazole (PROTONIX) EC tablet 40 mg  40 mg Oral BID AC Kinsinger, Arta Bruce, MD   40 mg at 09/10/17 [phone]  . senna-docusate (Senokot-S) tablet 2 tablet  2 tablet Oral QHS PRN Gardiner Barefoot, NP      . simvastatin (ZOCOR) tablet 40 mg  40 mg Oral QHS Kinsinger, Arta Bruce, MD   40 mg at 09/09/17 2119    Allergies as of 09/03/2017  . (No Known Allergies)    Family History  Problem Relation Age of Onset  . Arthritis Unknown        family history  . Diabetes Unknown        family history  . Hyperlipidemia Unknown        family history  . Lung cancer Unknown        family history  . Stroke Unknown        family history    Social History   Socioeconomic History  . Marital status: Married    Spouse name: Not on file  . Number of children: 1  . Years of education: Not on file  . Highest education level: Not on file  Occupational History  . Not on file  Social Needs  . Financial resource strain: Not on file  . Food insecurity:     Worry: Not on file    Inability: Not on file  . Transportation needs:    Medical: Not on file    Non-medical: Not on file  Tobacco Use  . Smoking status: Former Smoker    Packs/day: 1.50    Years: 3.00    Pack years: 4.50    Last attempt to quit: 04/07/1980    Years since quitting: 37.4  .  Smokeless tobacco: Never Used  Substance and Sexual Activity  . Alcohol use: No    Alcohol/week: 0.0 oz  . Drug use: No    Comment: quit in 1999, marijuana  . Sexual activity: Not on file  Lifestyle  . Physical activity:    Days per week: Not on file    Minutes per session: Not on file  . Stress: Not on file  Relationships  . Social connections:    Talks on phone: Not on file    Gets together: Not on file    Attends religious service: Not on file    Active member of club or organization: Not on file    Attends meetings of clubs or organizations: Not on file    Relationship status: Not on file  . Intimate partner violence:    Fear of current or ex partner: Not on file    Emotionally abused: Not on file    Physically abused: Not on file    Forced sexual activity: Not on file  Other Topics Concern  . Not on file  Social History Narrative  . Not on file    Review of Systems: All systems reviewed and negative except where noted in HPI.  Physical Exam: Vital signs in last 24 hours: Temp:  [97.8 F (36.6 C)-98.7 F (37.1 C)] 98.3 F (36.8 C) (06/06 0454) Pulse Rate:  [63-74] 74 (06/06 0454) Resp:  [18-20] 18 (06/06 0454) BP: (124-138)/(72-82) 136/74 (06/06 0454) SpO2:  [94 %-97 %] 97 % (06/06 0454) Last BM Date: 09/05/17 General:   Alert, well-developed,  white malein NAD Psych:  Pleasant, cooperative. Normal mood and affect. Eyes:  Pupils equal, sclera clear, no icterus.   Conjunctiva pink. Ears:  Normal auditory acuity. Nose:  No deformity, discharge,  or lesions. Neck:  Supple; no masses Lungs:  Clear throughout to auscultation.   No wheezes, crackles, or rhonchi.  Heart:   Regular rate and rhythm; no murmurs, no edema Abdomen:  Soft, obese, mild RUQ tenderness,  BS active, drain with dark bloody output ( bulb 1/2 full) Rectal:  Deferred  Msk:  Symmetrical without gross deformities. . Neurologic:  Alert and  oriented x4;  grossly normal neurologically. Skin:  Intact without significant lesions or rashes..   Intake/Output from previous day: 06/05 0701 - 06/06 0700 In: 300 [P.O.:300] Out: 711 [Urine:300; Drains:410; Stool:1] Intake/Output this shift: Total I/O In: -  Out: 100 [Drains:100]  Lab Results: Recent Labs    09/08/17 0442 09/09/17 0453 09/10/17 0357  WBC 9.8 11.8* 9.5  HGB 10.9* 10.9* 10.9*  HCT 32.2* 32.4* 33.2*  PLT 150 182 215   BMET Recent Labs    09/08/17 0442 09/09/17 0453 09/10/17 0357  NA 131* 130* 130*  K 4.4 4.0 4.2  CL 96* 98* 98*  CO2 24 26 23   GLUCOSE 185* 115* 99  BUN 19 22* 18  CREATININE 1.39* 1.34* 1.14  CALCIUM 8.5* 8.0* 8.1*   LFT Recent Labs    09/10/17 0357  PROT 6.3*  ALBUMIN 2.7*  AST 99*  ALT 206*  ALKPHOS 80  BILITOT 1.0    Studies/Results: Nm Hepatobiliary Liver Func  Result Date: 09/09/2017 CLINICAL DATA:  57 year old male status post cholecystectomy on 09/06/2017 with persistent right upper quadrant pain. EXAM: NUCLEAR MEDICINE HEPATOBILIARY IMAGING TECHNIQUE: Sequential images of the abdomen were obtained out to 60 minutes following intravenous administration of radiopharmaceutical. RADIOPHARMACEUTICALS:  5 mCi Tc-68m  Choletec IV COMPARISON:  09/06/2017. FINDINGS: Prompt uptake and biliary excretion of  activity by the liver is seen. Activity is visualized in the gallbladder fossa, followed by eventual spillage of activity beneath the right lobe of the liver into the upper right peritoneal cavity. Activity also extends into the small bowel via the common bile duct. IMPRESSION: 1. Study is positive for bile leak, as above. These results will be called to the ordering clinician or representative  by the Radiologist Assistant, and communication documented in the PACS or zVision Dashboard. Electronically Signed   By: Vinnie Langton M.D.   On: 09/09/2017 18:13     Tye Savoy, NP-C @  09/10/2017, 11:34 AM   I have reviewed the entire case in detail with the above APP and discussed the plan in detail.  Therefore, I agree with the diagnoses recorded above. In addition,  I have personally interviewed and examined the patient and have personally reviewed any abdominal/pelvic CT scan images.  I also reviewed the results of the HIDA scan.  I discussed the case in detail with Dr. Cherlyn Roberts at the time of my evaluation, since he was also there seeing the patient.  My additional thoughts are as follows:  The patient has upper abdominal tenderness, especially in the right upper quadrant at the site of the external drain.  He denies chest pain or dyspnea today.  He decidedly has a bile leak based on imaging and JP drain output.  The plan is for an ERCP with stent placement tomorrow.  The procedure was described in detail with him including diagrams of the anatomy.  His wife was present for the entire encounter.  He was agreeable, and all questions were answered.  The benefits and risks of the planned procedure were described in detail with the patient or (when appropriate) their health care proxy.  Risks were outlined as including, but not limited to, bleeding, infection, perforation, adverse medication reaction leading to cardiac or pulmonary decompensation, or pancreatitis (if ERCP).  The limitation of incomplete mucosal visualization was also discussed.  No guarantees or warranties were given.  Patient at increased risk for cardiopulmonary complications of procedure due to medical comorbidities.    Nelida Meuse III Office:[phone]

## 2017-09-10 NOTE — Progress Notes (Signed)
Physical Therapy Treatment Patient Details Name: Nicholas Gray MRN: [id number] DOB: 12-12-1960 Today's Date: 09/10/2017    History of Present Illness Admit with Acute gangrenous cholecystitis needing cholecystectomy.  Hx CHF, HTN.     PT Comments    Pt admitted with above diagnosis. Pt currently with functional limitations due to balance and endurance deficits. Limited today as pt dizzy upon sitting up to EOB.  Was unable to progress to ambulation as pt needed to lie back down. Pt will benefit from skilled PT to increase their independence and safety with mobility to allow discharge to the venue listed below.     Follow Up Recommendations  Supervision - Intermittent;No PT follow up     Equipment Recommendations  Other (comment)(TBA but most likely wont need anything)    Recommendations for Other Services       Precautions / Restrictions Precautions Precautions: Fall Precaution Comments: JP drain Restrictions Weight Bearing Restrictions: No    Mobility  Bed Mobility Overal bed mobility: Needs Assistance Bed Mobility: Supine to Sit;Sit to Supine     Supine to sit: Supervision;Min guard Sit to supine: Min guard   General bed mobility comments: Once pt sat up, NP came in to talk about procedure tomorrow.  Once NP finished talking, pt reported  feeling lightheaded and wanted to lie back down.  Min guard to lie down.   Transfers                    Ambulation/Gait                 Stairs             Wheelchair Mobility    Modified Rankin (Stroke Patients Only)       Balance Overall balance assessment: Needs assistance Sitting-balance support: No upper extremity supported;Feet supported Sitting balance-Leahy Scale: Good                                      Cognition Arousal/Alertness: Awake/alert Behavior During Therapy: WFL for tasks assessed/performed Overall Cognitive Status: Within Functional Limits for tasks  assessed                                        Exercises      General Comments        Pertinent Vitals/Pain Pain Assessment: 0-10 Pain Score: 6  Pain Location: abdomen, drain site Pain Descriptors / Indicators: Grimacing;Guarding Pain Intervention(s): Limited activity within patient's tolerance;Monitored during session;Repositioned  VSS, no orthostasis today even though pt is dizzy  Home Living                      Prior Function            PT Goals (current goals can now be found in the care plan section) Acute Rehab PT Goals Patient Stated Goal: to go home Progress towards PT goals: Not progressing toward goals - comment(dizziness and procedure to fix bile leak tomorrow. )    Frequency    Min 3X/week      PT Plan Current plan remains appropriate    Co-evaluation              AM-PAC PT "6 Clicks" Daily Activity  Outcome Measure  Difficulty turning over in bed (including  adjusting bedclothes, sheets and blankets)?: None Difficulty moving from lying on back to sitting on the side of the bed? : None Difficulty sitting down on and standing up from a chair with arms (e.g., wheelchair, bedside commode, etc,.)?: A Little Help needed moving to and from a bed to chair (including a wheelchair)?: A Little Help needed walking in hospital room?: A Little Help needed climbing 3-5 steps with a railing? : A Little 6 Click Score: 20    End of Session   Activity Tolerance: Patient limited by fatigue(limited by dizziness) Patient left: with call bell/phone within reach;in bed;with family/visitor present Nurse Communication: Mobility status PT Visit Diagnosis: Unsteadiness on feet (R26.81);Dizziness and giddiness (R42);Muscle weakness (generalized) (M62.81)     Time: 4854-6270 PT Time Calculation (min) (ACUTE ONLY): 15 min  Charges:  $Therapeutic Activity: 8-22 mins                    G Codes:       Nicholas Gray,PT Acute  Rehabilitation [phone] (pager)    Nicholas Gray 09/10/2017, 2:49 PM

## 2017-09-10 NOTE — Consult Note (Addendum)
Referring Provider: [facility] Primary Care Physician:  [facility] Primary Gastroenterologist:   Dr. Vira Agar  Reason for Consultation:   Bile leak    ASSESSMENT AND PLAN:    38. 57 yo male, POD #3 lap cholecystectomy with JP drain placement for acute cholecystitis. Gallbladder significantly inflamed with gangrene. Increased JP drainage yesterday, found on HIDA last night to have bile duct leak. - Liver chemistries improving. Bili normal. WBC normal. Afebrile. Completed Zosyn last night, not on antibiotics at present.  - Will need ERCP with stent placement. I explained the procedure including risks / benefits to the patient and he consents to proceed. I will coordinate timing with Dr. Loletha Carrow / Anesthesia  2. Acute on chronic systolic heart failure, EF 25-30%, down from 40-45% in 2015. Cardiology felt decline in EF was possible due to left BBB. He was cleared for lap chole this admission.   3. AKI, improving  4. History of colon polyps, tubular adenomas without HGD. He was due for surveillance colonoscopy this past January (Dr. Vira Agar).  -Reminded him to follow up with GI . 5.  Elevated liver chemistries  HPI: Nicholas Gray is a 57 y.o. male with hx of dilated cardiomyopathy with EF of 25-30%, HTN, OSA who underwent laparoscopic cholecystetomy 3 days ago for acute cholecystitis . Gallbladder was significantly inflamed. Path c/w acute inflammation and necrosis. JP drain placed due to risk of bile leak. Yesterday drain output increased, transaminases increased. HIDA positive for bile leak.  Currently his abdominal pain is tolerable. He is aware of the bile leak and need for stent placement. No other GI complaints. Bowel normally move okay but a little constipated at present.   Past Medical History:  Diagnosis Date  . Arthritis    gout  . Cardiomyopathy    EF 30-40% (Normal coronaries cath 2010).  Echo 2015 EF 40%  . CHF (congestive heart failure)  ([place])   . Colonic polyp   . Diverticulosis of colon   . GERD (gastroesophageal reflux disease)   . History of chicken pox   . HLD (hyperlipidemia)    recent  . HTN (hypertension)    recent  . Sleep apnea    does use CPAP.      Past Surgical History:  Procedure Laterality Date  . CARDIAC CATHETERIZATION  2010  . CHOLECYSTECTOMY N/A 09/07/2017   Procedure: LAPAROSCOPIC CHOLECYSTECTOMY;  Surgeon: Kinsinger, Arta Bruce, MD;  Location: [facility];  Service: General;  Laterality: N/A;  LAPAROSCOPIC CHOLECYSTECTOMY  . TONSILLECTOMY AND ADENOIDECTOMY      Prior to Admission medications   Medication Sig Start Date End Date Taking? Authorizing Provider  allopurinol (ZYLOPRIM) 100 MG tablet Take 100 mg by mouth 2 (two) times daily.    Yes [provider]  carvedilol (COREG) 25 MG tablet TAKE (1) TABLET BY MOUTH TWICE DAILY WITH A MEAL. 01/09/17  Yes Minus Breeding, MD  furosemide (LASIX) 20 MG tablet Take 20 mg by mouth daily as needed (swelling).   Yes [provider]  lisinopril (PRINIVIL,ZESTRIL) 10 MG tablet TAKE 2 TABLETS BY MOUTH TWICE DAILY. 01/09/17  Yes Minus Breeding, MD  ranitidine (ZANTAC) 150 MG tablet Take 150 mg by mouth daily.   Yes [provider]  simvastatin (ZOCOR) 40 MG tablet Take 40 mg by mouth at bedtime.   Yes [provider]  pantoprazole (PROTONIX) 40 MG tablet Take 1 tablet (40 mg total) by mouth 2 (two) times daily before a meal for 14 days.  09/05/17 09/19/17  Bonnielee Haff, MD    Current Facility-Administered Medications  Medication Dose Route Frequency Provider Last Rate Last Dose  . acetaminophen (TYLENOL) tablet 1,000 mg  1,000 mg Oral Q6H PRN Saverio Danker, PA-C   1,000 mg at 09/09/17 0842  . allopurinol (ZYLOPRIM) tablet 100 mg  100 mg Oral BID Kinsinger, Arta Bruce, MD   100 mg at 09/09/17 2119  . carvedilol (COREG) tablet 25 mg  25 mg Oral BID WC Kinsinger, Arta Bruce, MD   25 mg at 09/10/17 0926  . docusate sodium (COLACE)  capsule 100 mg  100 mg Oral BID Phillips Grout, MD   100 mg at 09/09/17 2119  . enoxaparin (LOVENOX) injection 40 mg  40 mg Subcutaneous Q24H Kinsinger, Arta Bruce, MD   40 mg at 09/09/17 2119  . famotidine (PEPCID) tablet 20 mg  20 mg Oral Daily Kinsinger, Arta Bruce, MD   20 mg at 09/10/17 0926  . gi cocktail (Maalox,Lidocaine,Donnatal)  30 mL Oral QID PRN Kinsinger, Arta Bruce, MD   30 mL at 09/04/17 0132  . morphine 4 MG/ML injection 2 mg  2 mg Intravenous Q2H PRN Kinsinger, Arta Bruce, MD   2 mg at 09/09/17 [phone]  . mupirocin ointment (BACTROBAN) 2 %   Nasal BID Derrill Kay A, MD      . nitroGLYCERIN (NITROSTAT) SL tablet 0.4 mg  0.4 mg Sublingual Q5 min PRN Kinsinger, Arta Bruce, MD   0.4 mg at 09/03/17 2018  . ondansetron (ZOFRAN) injection 4 mg  4 mg Intravenous Q6H PRN Kinsinger, Arta Bruce, MD      . oxyCODONE (Oxy IR/ROXICODONE) immediate release tablet 5-10 mg  5-10 mg Oral Q4H PRN Saverio Danker, PA-C   10 mg at 09/10/17 0608  . pantoprazole (PROTONIX) EC tablet 40 mg  40 mg Oral BID AC Kinsinger, Arta Bruce, MD   40 mg at 09/10/17 [phone]  . senna-docusate (Senokot-S) tablet 2 tablet  2 tablet Oral QHS PRN Gardiner Barefoot, NP      . simvastatin (ZOCOR) tablet 40 mg  40 mg Oral QHS Kinsinger, Arta Bruce, MD   40 mg at 09/09/17 2119    Allergies as of 09/03/2017  . (No Known Allergies)    Family History  Problem Relation Age of Onset  . Arthritis Unknown        family history  . Diabetes Unknown        family history  . Hyperlipidemia Unknown        family history  . Lung cancer Unknown        family history  . Stroke Unknown        family history    Social History   Socioeconomic History  . Marital status: Married    Spouse name: Not on file  . Number of children: 1  . Years of education: Not on file  . Highest education level: Not on file  Occupational History  . Not on file  Social Needs  . Financial resource strain: Not on file  . Food insecurity:     Worry: Not on file    Inability: Not on file  . Transportation needs:    Medical: Not on file    Non-medical: Not on file  Tobacco Use  . Smoking status: Former Smoker    Packs/day: 1.50    Years: 3.00    Pack years: 4.50    Last attempt to quit: 04/07/1980    Years since quitting: 37.4  .  Smokeless tobacco: Never Used  Substance and Sexual Activity  . Alcohol use: No    Alcohol/week: 0.0 oz  . Drug use: No    Comment: quit in 1999, marijuana  . Sexual activity: Not on file  Lifestyle  . Physical activity:    Days per week: Not on file    Minutes per session: Not on file  . Stress: Not on file  Relationships  . Social connections:    Talks on phone: Not on file    Gets together: Not on file    Attends religious service: Not on file    Active member of club or organization: Not on file    Attends meetings of clubs or organizations: Not on file    Relationship status: Not on file  . Intimate partner violence:    Fear of current or ex partner: Not on file    Emotionally abused: Not on file    Physically abused: Not on file    Forced sexual activity: Not on file  Other Topics Concern  . Not on file  Social History Narrative  . Not on file    Review of Systems: All systems reviewed and negative except where noted in HPI.  Physical Exam: Vital signs in last 24 hours: Temp:  [97.8 F (36.6 C)-98.7 F (37.1 C)] 98.3 F (36.8 C) (06/06 0454) Pulse Rate:  [63-74] 74 (06/06 0454) Resp:  [18-20] 18 (06/06 0454) BP: (124-138)/(72-82) 136/74 (06/06 0454) SpO2:  [94 %-97 %] 97 % (06/06 0454) Last BM Date: 09/05/17 General:   Alert, well-developed,  white malein NAD Psych:  Pleasant, cooperative. Normal mood and affect. Eyes:  Pupils equal, sclera clear, no icterus.   Conjunctiva pink. Ears:  Normal auditory acuity. Nose:  No deformity, discharge,  or lesions. Neck:  Supple; no masses Lungs:  Clear throughout to auscultation.   No wheezes, crackles, or rhonchi.  Heart:   Regular rate and rhythm; no murmurs, no edema Abdomen:  Soft, obese, mild RUQ tenderness,  BS active, drain with dark bloody output ( bulb 1/2 full) Rectal:  Deferred  Msk:  Symmetrical without gross deformities. . Neurologic:  Alert and  oriented x4;  grossly normal neurologically. Skin:  Intact without significant lesions or rashes..   Intake/Output from previous day: 06/05 0701 - 06/06 0700 In: 300 [P.O.:300] Out: 711 [Urine:300; Drains:410; Stool:1] Intake/Output this shift: Total I/O In: -  Out: 100 [Drains:100]  Lab Results: Recent Labs    09/08/17 0442 09/09/17 0453 09/10/17 0357  WBC 9.8 11.8* 9.5  HGB 10.9* 10.9* 10.9*  HCT 32.2* 32.4* 33.2*  PLT 150 182 215   BMET Recent Labs    09/08/17 0442 09/09/17 0453 09/10/17 0357  NA 131* 130* 130*  K 4.4 4.0 4.2  CL 96* 98* 98*  CO2 24 26 23   GLUCOSE 185* 115* 99  BUN 19 22* 18  CREATININE 1.39* 1.34* 1.14  CALCIUM 8.5* 8.0* 8.1*   LFT Recent Labs    09/10/17 0357  PROT 6.3*  ALBUMIN 2.7*  AST 99*  ALT 206*  ALKPHOS 80  BILITOT 1.0    Studies/Results: Nm Hepatobiliary Liver Func  Result Date: 09/09/2017 CLINICAL DATA:  57 year old male status post cholecystectomy on 09/06/2017 with persistent right upper quadrant pain. EXAM: NUCLEAR MEDICINE HEPATOBILIARY IMAGING TECHNIQUE: Sequential images of the abdomen were obtained out to 60 minutes following intravenous administration of radiopharmaceutical. RADIOPHARMACEUTICALS:  5 mCi Tc-72m  Choletec IV COMPARISON:  09/06/2017. FINDINGS: Prompt uptake and biliary excretion of  activity by the liver is seen. Activity is visualized in the gallbladder fossa, followed by eventual spillage of activity beneath the right lobe of the liver into the upper right peritoneal cavity. Activity also extends into the small bowel via the common bile duct. IMPRESSION: 1. Study is positive for bile leak, as above. These results will be called to the ordering clinician or representative  by the Radiologist Assistant, and communication documented in the PACS or zVision Dashboard. Electronically Signed   By: Vinnie Langton M.D.   On: 09/09/2017 18:13     Tye Savoy, NP-C @  09/10/2017, 11:34 AM   I have reviewed the entire case in detail with the above APP and discussed the plan in detail.  Therefore, I agree with the diagnoses recorded above. In addition,  I have personally interviewed and examined the patient and have personally reviewed any abdominal/pelvic CT scan images.  I also reviewed the results of the HIDA scan.  I discussed the case in detail with Dr. Cherlyn Roberts at the time of my evaluation, since he was also there seeing the patient.  My additional thoughts are as follows:  The patient has upper abdominal tenderness, especially in the right upper quadrant at the site of the external drain.  He denies chest pain or dyspnea today.  He decidedly has a bile leak based on imaging and JP drain output.  The plan is for an ERCP with stent placement tomorrow.  The procedure was described in detail with him including diagrams of the anatomy.  His wife was present for the entire encounter.  He was agreeable, and all questions were answered.  The benefits and risks of the planned procedure were described in detail with the patient or (when appropriate) their health care proxy.  Risks were outlined as including, but not limited to, bleeding, infection, perforation, adverse medication reaction leading to cardiac or pulmonary decompensation, or pancreatitis (if ERCP).  The limitation of incomplete mucosal visualization was also discussed.  No guarantees or warranties were given.  Patient at increased risk for cardiopulmonary complications of procedure due to medical comorbidities.    Nelida Meuse III Office:[phone]

## 2017-09-10 NOTE — Progress Notes (Signed)
Patient ID: Nicholas Gray, male   DOB: 05/09/1960, 57 y.o.   MRN: [id number] Patient ID: Nicholas Gray, male   DOB: 16-Jan-1961, 57 y.o.   MRN: [id number]  PROGRESS NOTE    Nicholas Gray  NWG:[id number] DOB: 16-Apr-1960 DOA: 09/03/2017 PCP: [facility]   Brief Narrative: 57 year old Caucasian male with a past medical history of dilated cardiomyopathy with a EF of 25 to 30%, hypertension, obstructive sleep apnea, clean coronaries based on cardiac cath in 2010 presented with chest pain.  Patient was hospitalized for further management.  Patient was seen by cardiology.  He had a stress test which was intermediate risk.  He was also noted to have abdominal discomfort and pain.  He underwent ultrasound followed by CT scan and then a HIDA scan.  These tests suggest acute cholecystitis.  General surgery consulted.  Patient underwent laparoscopic cholecystectomy on 6/3.  Seems to be medically stable postoperatively however 6 /5 HIDA scan revealed a bile leak.     Assessment & Plan:   Principal Problem:   Chest pain, rule out acute myocardial infarction Active Problems:   Essential hypertension   Dilated cardiomyopathy (HCC)   LBBB (left bundle branch block)   OSA (obstructive sleep apnea)   Chronic systolic CHF (congestive heart failure) (HCC) Acute gangrenous  cholecystitis now with bile leak postoperatively  Acute cholecystitis with postoperative bile leak Initially patient's LFTs were normal.  Right upper quadrant ultrasound did not show any biliary disease.  Due to persistent symptoms of CT scan of the abdomen pelvis was done which suggested acute cholecystitis.  General surgery was consulted.  Patient underwent HIDA scan which shows acute cholecystitis.  Patient was cleared by cardiology.  Patient underwent laparoscopic cholecystectomy on 6/3.  Significantly inflamed gallbladder was noted with pus.  Gangrenous changes were noted in the cystic duct.  Continue Zosyn for  now per general surgery.  LFTs are stable.  Patient has a drain due to high risk for biliary leak.  Further management per general surgery.  Mobilize.    Found to have bowel leak yesterday.  GI consulted by general surgery.  Chronic systolic CHF due to nonischemic cardiomyopathy Patient with mildly elevated troponins.  With history of LBBB. Seen by cardiology. Underwent stress test which was intermediate risk.  Patient cleared by cardiology for surgery.  He appears to be euvolemic.  There was some concern for hypovolemia due to rising creatinine as he was n.p.o. for almost 2 days.  Patient was gently hydrated.  Creatinine has improved.  We will discontinue fluids.  Continue to hold Lasix and ACE inhibitor for now.  Could be resumed if renal function continues to improve but will continue to hold now with new issue of bile leak.  Acute renal failure Unclear if patient has a history of CKD.  Being climbed from 1.31-1.63 and then 1.72.    Currently creatinine is normalized after holding diuretics ACE inhibitor and with IV fluids.  He is making urine.    Now have Stop IV fluids to avoid fluid overload.  He is tolerating his diet well.    Continue to monitor.  Essential hypertension Blood pressure is reasonably well controlled.  Continue with beta-blocker.  ACE inhibitor on hold due to elevated creatinine..  Obstructive sleep apnea CPAP.    DVT prophylaxis: Lovenox  Code Status: Full code  Family Communication: None  Disposition Plan: Per general surgery   Consultants:   General surgery and cardiology and GI  Procedures: Status post  ex lap on 09/07/2017  Stress test  There was no ST segment deviation noted during stress.  This is an intermediate risk study.  The left ventricular ejection fraction is moderately decreased (30-44%).  1. Fixed medium-sized, moderate intensity mid to apical anteroseptal, mid to apical inferoseptal, and true apex perfusion defect. Prior infarction involving  septum versus LBBB artifact. No significant ischemia.  2. EF 39% with septal dyskinesis.  Intermediate risk study because of low EF.       Subjective: Patient reports he is feeling better than yesterday.  He did okay with physical therapy yesterday.  Reports his pain is better controlled.  Objective: Vitals:   09/09/17 1041 09/09/17 1223 09/09/17 1936 09/10/17 0454  BP: 119/79 124/72 138/82 136/74  Pulse:  63 63 74  Resp:   20 18  Temp:  97.8 F (36.6 C) 98.7 F (37.1 C) 98.3 F (36.8 C)  TempSrc:  Oral    SpO2:  97% 94% 97%  Weight:      Height:        Intake/Output Summary (Last 24 hours) at 09/10/2017 1043 Last data filed at 09/10/2017 0931 Gross per 24 hour  Intake 300 ml  Output 736 ml  Net -436 ml   Filed Weights   09/07/17 1514 09/08/17 0506 09/09/17 0500  Weight: 118.8 kg (261 lb 14.4 oz) 118.3 kg (260 lb 14.4 oz) 117.8 kg (259 lb 12.8 oz)    Examination:  General exam: Appears calm and comfortable  Respiratory system: Clear to auscultation. Respiratory effort normal. Cardiovascular system: S1 & S2 heard, RRR. No JVD, murmurs, rubs, gallops or clicks. No pedal edema. Gastrointestinal system: Abdomen is nondistended, soft and nontender. No organomegaly or masses felt. Normal bowel sounds heard. Central nervous system: Alert and oriented. No focal neurological deficits. Extremities: Symmetric 5 x 5 power. Skin: No rashes, lesions or ulcers Psychiatry: Judgement and insight appear normal. Mood & affect appropriate.     Data Reviewed: I have personally reviewed following labs and imaging studies  CBC: Recent Labs  Lab 09/06/17 0532 09/07/17 0408 09/08/17 0442 09/09/17 0453 09/10/17 0357  WBC 12.7* 10.8* 9.8 11.8* 9.5  HGB 10.9* 11.1* 10.9* 10.9* 10.9*  HCT 32.2* 32.3* 32.2* 32.4* 33.2*  MCV 91.7 90.5 91.5 93.1 91.7  PLT 148* 155 150 182 443   Basic Metabolic Panel: Recent Labs  Lab 09/06/17 0532 09/07/17 0408 09/08/17 0442 09/09/17 0453  09/10/17 0357  NA 131* 129* 131* 130* 130*  K 4.0 4.0 4.4 4.0 4.2  CL 97* 98* 96* 98* 98*  CO2 24 23 24 26 23   GLUCOSE 115* 106* 185* 115* 99  BUN 21* 16 19 22* 18  CREATININE 1.72* 1.61* 1.39* 1.34* 1.14  CALCIUM 8.4* 8.1* 8.5* 8.0* 8.1*   GFR: Estimated Creatinine Clearance: 90.8 mL/min (by C-G formula based on SCr of 1.14 mg/dL). Liver Function Tests: Recent Labs  Lab 09/06/17 0532 09/07/17 0408 09/08/17 0442 09/09/17 0453 09/10/17 0357  AST 32 128* 149* 160* 99*  ALT 35 140* 193* 252* 206*  ALKPHOS 68 72 84 87 80  BILITOT 1.2 1.7* 1.3* 0.8 1.0  PROT 6.3* 6.6 6.9 6.6 6.3*  ALBUMIN 3.2* 3.0* 2.8* 2.6* 2.7*   Recent Labs  Lab 09/04/17 0902  LIPASE 25   No results for input(s): AMMONIA in the last 168 hours. Coagulation Profile: No results for input(s): INR, PROTIME in the last 168 hours. Cardiac Enzymes: Recent Labs  Lab 09/03/17 2210 09/04/17 0206 09/04/17 0359  TROPONINI 0.03* 0.03*  0.04*   BNP (last 3 results) No results for input(s): PROBNP in the last 8760 hours. HbA1C: No results for input(s): HGBA1C in the last 72 hours. CBG: No results for input(s): GLUCAP in the last 168 hours. Lipid Profile: No results for input(s): CHOL, HDL, LDLCALC, TRIG, CHOLHDL, LDLDIRECT in the last 72 hours. Thyroid Function Tests: No results for input(s): TSH, T4TOTAL, FREET4, T3FREE, THYROIDAB in the last 72 hours. Anemia Panel: No results for input(s): VITAMINB12, FOLATE, FERRITIN, TIBC, IRON, RETICCTPCT in the last 72 hours. Sepsis Labs: No results for input(s): PROCALCITON, LATICACIDVEN in the last 168 hours.  Recent Results (from the past 240 hour(s))  Surgical pcr screen     Status: Abnormal   Collection Time: 09/07/17  2:00 PM  Result Value Ref Range Status   MRSA, PCR NEGATIVE NEGATIVE Final   Staphylococcus aureus POSITIVE (A) NEGATIVE Final    Comment: (NOTE) The Xpert SA Assay (FDA approved for NASAL specimens in patients 63 years of age and older), is  one component of a comprehensive surveillance program. It is not intended to diagnose infection nor to guide or monitor treatment. Performed at [facility], [place] [address], [place], [place] [address]          Radiology Studies: Nm Hepatobiliary Liver Func  Result Date: 09/09/2017 CLINICAL DATA:  57 year old male status post cholecystectomy on 09/06/2017 with persistent right upper quadrant pain. EXAM: NUCLEAR MEDICINE HEPATOBILIARY IMAGING TECHNIQUE: Sequential images of the abdomen were obtained out to 60 minutes following intravenous administration of radiopharmaceutical. RADIOPHARMACEUTICALS:  5 mCi Tc-9m  Choletec IV COMPARISON:  09/06/2017. FINDINGS: Prompt uptake and biliary excretion of activity by the liver is seen. Activity is visualized in the gallbladder fossa, followed by eventual spillage of activity beneath the right lobe of the liver into the upper right peritoneal cavity. Activity also extends into the small bowel via the common bile duct. IMPRESSION: 1. Study is positive for bile leak, as above. These results will be called to the ordering clinician or representative by the Radiologist Assistant, and communication documented in the PACS or zVision Dashboard. Electronically Signed   By: Vinnie Langton M.D.   On: 09/09/2017 18:13        Scheduled Meds: . allopurinol  100 mg Oral BID  . carvedilol  25 mg Oral BID WC  . docusate sodium  100 mg Oral BID  . enoxaparin (LOVENOX) injection  40 mg Subcutaneous Q24H  . famotidine  20 mg Oral Daily  . mupirocin ointment   Nasal BID  . pantoprazole  40 mg Oral BID AC  . simvastatin  40 mg Oral QHS   Continuous Infusions:   LOS: 1 day    Time spent: 35 minutes    Tracie Dore A, MD [facility] Pager [phone]  If 7PM-7AM, please contact night-coverage [email or web address] Password [facility] 09/10/2017, 10:43 AM

## 2017-09-11 ENCOUNTER — Encounter (HOSPITAL_COMMUNITY): Payer: 59 | Admitting: Cardiology

## 2017-09-11 ENCOUNTER — Encounter (HOSPITAL_COMMUNITY): Payer: Self-pay | Admitting: *Deleted

## 2017-09-11 ENCOUNTER — Encounter (HOSPITAL_COMMUNITY)
Admission: EM | Disposition: A | Discharge status: Home / Self Care | Payer: Self-pay | Source: Home / Self Care | Attending: Internal Medicine

## 2017-09-11 ENCOUNTER — Inpatient Hospital Stay (HOSPITAL_COMMUNITY): Payer: 59

## 2017-09-11 ENCOUNTER — Inpatient Hospital Stay (HOSPITAL_COMMUNITY): Payer: 59 | Admitting: Certified Registered"

## 2017-09-11 DIAGNOSIS — K839 Disease of biliary tract, unspecified: Secondary | ICD-10-CM

## 2017-09-11 HISTORY — PX: ERCP: SHX5425

## 2017-09-11 SURGERY — ERCP, WITH INTERVENTION IF INDICATED
Anesthesia: General

## 2017-09-11 MED ORDER — ONDANSETRON HCL 4 MG/2ML IJ SOLN
INTRAMUSCULAR | Status: DC | PRN
  Administered 2017-09-11: 4 mg via INTRAVENOUS

## 2017-09-11 MED ORDER — GLUCAGON HCL RDNA (DIAGNOSTIC) 1 MG IJ SOLR
INTRAMUSCULAR | Status: AC
  Filled 2017-09-11: qty 1

## 2017-09-11 MED ORDER — SUCCINYLCHOLINE CHLORIDE 20 MG/ML IJ SOLN
INTRAMUSCULAR | Status: DC | PRN
  Administered 2017-09-11: 100 mg via INTRAVENOUS

## 2017-09-11 MED ORDER — SODIUM CHLORIDE 0.9 % IV SOLN
INTRAVENOUS | Status: DC | PRN
  Administered 2017-09-11: 5 mL

## 2017-09-11 MED ORDER — ROCURONIUM BROMIDE 100 MG/10ML IV SOLN
INTRAVENOUS | Status: DC | PRN
  Administered 2017-09-11: 30 mg via INTRAVENOUS

## 2017-09-11 MED ORDER — NOREPINEPHRINE BITARTRATE 1 MG/ML IV SOLN
INTRAVENOUS | Status: DC | PRN
  Administered 2017-09-11: 20 ug/min via INTRAVENOUS

## 2017-09-11 MED ORDER — GLUCAGON HCL RDNA (DIAGNOSTIC) 1 MG IJ SOLR
INTRAMUSCULAR | Status: DC | PRN
  Administered 2017-09-11 (×2): .5 mg via INTRAVENOUS

## 2017-09-11 MED ORDER — INDOMETHACIN 50 MG RE SUPP
RECTAL | Status: DC | PRN
  Administered 2017-09-11: 100 mg via RECTAL

## 2017-09-11 MED ORDER — INDOMETHACIN 50 MG RE SUPP: 100.0000 mg | Freq: Once | RECTAL | Status: DC

## 2017-09-11 MED ORDER — FENTANYL CITRATE (PF) 100 MCG/2ML IJ SOLN
INTRAMUSCULAR | Status: DC | PRN
  Administered 2017-09-11: 100 ug via INTRAVENOUS

## 2017-09-11 MED ORDER — INDOMETHACIN 50 MG RE SUPP
RECTAL | Status: AC
  Filled 2017-09-11: qty 2

## 2017-09-11 MED ORDER — LIDOCAINE HCL (CARDIAC) PF 100 MG/5ML IV SOSY
PREFILLED_SYRINGE | INTRAVENOUS | Status: DC | PRN
  Administered 2017-09-11: 60 mg via INTRAVENOUS

## 2017-09-11 MED ORDER — LACTATED RINGERS IV SOLN
INTRAVENOUS | Status: AC | PRN
  Administered 2017-09-11: 1000 mL via INTRAVENOUS

## 2017-09-11 MED ORDER — SODIUM CHLORIDE 0.9 % IV SOLN
1.5000 g | Freq: Once | INTRAVENOUS | Status: AC
  Administered 2017-09-11: 1.5 g via INTRAVENOUS
  Filled 2017-09-11: qty 1.5

## 2017-09-11 MED ORDER — IOHEXOL 300 MG/ML  SOLN
100.0000 mL | Freq: Once | INTRAMUSCULAR | Status: AC | PRN
  Administered 2017-09-12: 100 mL via INTRAVENOUS

## 2017-09-11 MED ORDER — IOPAMIDOL (ISOVUE-300) INJECTION 61%
INTRAVENOUS | Status: AC
  Filled 2017-09-11: qty 100

## 2017-09-11 MED ORDER — PROPOFOL 10 MG/ML IV BOLUS
INTRAVENOUS | Status: DC | PRN
  Administered 2017-09-11: 150 mg via INTRAVENOUS

## 2017-09-11 MED ORDER — DEXAMETHASONE SODIUM PHOSPHATE 10 MG/ML IJ SOLN
INTRAMUSCULAR | Status: DC | PRN
  Administered 2017-09-11: 5 mg via INTRAVENOUS

## 2017-09-11 MED ORDER — SUGAMMADEX SODIUM 200 MG/2ML IV SOLN
INTRAVENOUS | Status: DC | PRN
  Administered 2017-09-11: 200 mg via INTRAVENOUS

## 2017-09-11 NOTE — Op Note (Signed)
[facility] Patient Name: Nicholas Gray Procedure Date : 09/11/2017 MRN: [id number] Attending MD: Estill Cotta. Loletha Carrow , MD Date of Birth: 1961/01/31 CSN: [id number] Age: 57 Admit Type: Inpatient Procedure:                ERCP Indications:              Treatment of bile leak Providers:                Mallie Mussel L. Loletha Carrow, MD, Kingsley Plan, RN, Elspeth Cho Tech., Technician Referring MD:             Gurney Maxin, MD Medicines:                General Anesthesia, Indomethacin 387 mg PR Complications:            No immediate complications. Estimated Blood Loss:     Estimated blood loss: none. Procedure:                Pre-Anesthesia Assessment:                           - Prior to the procedure, a History and Physical                            was performed, and patient medications and                            allergies were reviewed. The patient's tolerance of                            previous anesthesia was also reviewed. The risks                            and benefits of the procedure and the sedation                            options and risks were discussed with the patient.                            All questions were answered, and informed consent                            was obtained. Prior Anticoagulants: The patient has                            taken Lovenox (enoxaparin), last dose was 1 day                            prior to procedure. ASA Grade Assessment: III - A                            patient with severe systemic disease. After  reviewing the risks and benefits, the patient was                            deemed in satisfactory condition to undergo the                            procedure.                           After obtaining informed consent, the scope was                            passed under direct vision. Throughout the                            procedure, the patient's blood  pressure, pulse, and                            oxygen saturations were monitored continuously. The                            [id number] scope was introduced through the                            mouth, and used to inject contrast into and used to                            inject contrast into the ventral pancreatic duct.                            The ERCP was performed with difficulty due to                            challenging cannulation. The patient tolerated the                            procedure well. Scope In: Scope Out: Findings:      A scout film of the abdomen was obtained. A biliary drain was seen in       the area of the right upper quadrant of the abdomen. The esophagus was       successfully intubated under direct vision. The scope was advanced to a       normal major papilla (with a small punctum) in the descending duodenum       without detailed examination of the pharynx, larynx and associated       structures, and upper GI tract. The upper GI tract was grossly normal       except for mild edema in the duodenal sweep - scope passed with mild       reistance. The major papilla was at a somewhat eccentric angle, and the       opening was small. Cannulation was very difficult and biliary       cannulation was not achieved. A standard size sphincterotome would not       pass into the punctum of the major papilla. A long  0.025 inch Antonietta Breach       was passed inadvertently into the ventral pancreatic duct. The ventral       pancreatic duct was superficially cannulated with the tapered       sphincterotome. As location of the wire was uncertain, a brief ,small       volume contrast injection was performed. I personally interpreted the       pancreatic duct images. Ductal flow of contrast was adequate. Image       quality was adequate. Contrast extended to the proximal pancreatic duct,       which was of normal caliber. The wire could not be kept in the PD in        order to facilitate passage of another wire into the biliary tree. The       bile duct could not be cannulated, thus the scope was removed. The total       fluoroscopy exposure time was 1 minute and 45 seconds. Impression:               Failed biliary cannulation. Recommendation:           - Return patient to hospital ward for ongoing care.                           - The patient will require either PTC with internal                            stent placement or transfer to tertiary care center                            for advanced biliary endoscopist attempt at ERCP. Procedure Code(s):        --- Professional ---                           [phone], Endoscopic retrograde                            cholangiopancreatography (ERCP); diagnostic,                            including collection of specimen(s) by brushing or                            washing, when performed (separate procedure) Diagnosis Code(s):        --- Professional ---                           K83.8, Other specified diseases of biliary tract CPT copyright 2017 American Medical Association. All rights reserved. The codes documented in this report are preliminary and upon coder review may  be revised to meet current compliance requirements. Baley Lorimer L. Loletha Carrow, MD 09/11/2017 3:05:45 PM This report has been signed electronically. Number of Addenda: 0

## 2017-09-11 NOTE — Interval H&P Note (Signed)
History and Physical Interval Note:  09/11/2017 1:12 PM  Nicholas Gray  has presented today for surgery, with the diagnosis of bile leaks  The various methods of treatment have been discussed with the patient and family. After consideration of risks, benefits and other options for treatment, the patient has consented to  Procedure(s): ENDOSCOPIC RETROGRADE CHOLANGIOPANCREATOGRAPHY (ERCP) (N/A) as a surgical intervention .  The patient's history has been reviewed, patient examined, no change in status, stable for surgery.  I have reviewed the patient's chart and labs.  Questions were answered to the patient's satisfaction.     Nelida Meuse III

## 2017-09-11 NOTE — Progress Notes (Signed)
Patient ID: Nicholas Gray, male   DOB: [birth year], 57 y.o.   MRN: [id number] Patient ID: Nicholas Gray, male   DOB: 07-04-1960, 57 y.o.   MRN: [id number]  PROGRESS NOTE    Nicholas Gray  GBE:[id number] DOB: 09/15/1960 DOA: 09/03/2017 PCP: [facility]   Brief Narrative: 57 year old Caucasian male with a past medical history of dilated cardiomyopathy with a EF of 25 to 30%, hypertension, obstructive sleep apnea, clean coronaries based on cardiac cath in 2010 presented with chest pain.  Patient was hospitalized for further management.  Patient was seen by cardiology.  He had a stress test which was intermediate risk.  He was also noted to have abdominal discomfort and pain.  He underwent ultrasound followed by CT scan and then a HIDA scan.  These tests suggest acute cholecystitis.  General surgery consulted.  Patient underwent laparoscopic cholecystectomy on 6/3.  Seems to be medically stable postoperatively however 6 /5 HIDA scan revealed a bile leak.  For ERCP and stent placement on 09/11/2017     Assessment & Plan:   Principal Problem:   Chest pain, rule out acute myocardial infarction Active Problems:   Essential hypertension   Dilated cardiomyopathy (HCC)   LBBB (left bundle branch block)   OSA (obstructive sleep apnea)   Chronic systolic CHF (congestive heart failure) (HCC)   RUQ pain   Bile leak Acute gangrenous  cholecystitis now with bile leak postoperatively  Acute cholecystitis with postoperative bile leak Initially patient's LFTs were normal.  Right upper quadrant ultrasound did not show any biliary disease.  Due to persistent symptoms of CT scan of the abdomen pelvis was done which suggested acute cholecystitis.  General surgery was consulted.  Patient underwent HIDA scan which shows acute cholecystitis.  Patient was cleared by cardiology.  Patient underwent laparoscopic cholecystectomy on 6/3.  Significantly inflamed gallbladder was noted with pus.   Gangrenous changes were noted in the cystic duct.  Continue Zosyn for now per general surgery.  LFTs are stable.  Patient has a drain due to high risk for biliary leak which we will continue at discharge.  Further management per general surgery.  Mobilize.    For ERCP today for biliary leak and stent placement  Chronic systolic CHF due to nonischemic cardiomyopathy Patient with mildly elevated troponins.  With history of LBBB. Seen by cardiology. Underwent stress test which was intermediate risk.  Patient cleared by cardiology for surgery.  He appears to be euvolemic.  There was some concern for hypovolemia due to rising creatinine as he was n.p.o. for almost 2 days.  Patient was gently hydrated.  Creatinine has improved.  We will discontinue fluids.  Continue to hold Lasix and ACE inhibitor for now we will consider restarting this in the next couple days.    Acute renal failure Unclear if patient has a history of CKD.  Being climbed from 1.31-1.63 and then 1.72 now normal at 1.1.    Currently creatinine is normalized after holding diuretics ACE inhibitor and with IV fluids.  He is making urine.    Now have Stop IV fluids to avoid fluid overload.  He is tolerating his diet well.    Continue to monitor.  Essential hypertension Blood pressure is reasonably well controlled.  Continue with beta-blocker.  ACE inhibitor on hold due to elevated creatinine..  Obstructive sleep apnea CPAP.    DVT prophylaxis: Lovenox  Code Status: Full code  Family Communication: None  Disposition Plan: Per general surgery and GI  Consultants:   General surgery and cardiology and GI  Procedures: Status post ex lap on 09/07/2017  Stress test  There was no ST segment deviation noted during stress.  This is an intermediate risk study.  The left ventricular ejection fraction is moderately decreased (30-44%).  1. Fixed medium-sized, moderate intensity mid to apical anteroseptal, mid to apical inferoseptal, and  true apex perfusion defect. Prior infarction involving septum versus LBBB artifact. No significant ischemia.  2. EF 39% with septal dyskinesis.  Intermediate risk study because of low EF.       Subjective: Patient moving around more.  He is passing gas.  Patient n.p.o. for ERCP today.  Objective: Vitals:   09/11/17 1200 09/11/17 1504 09/11/17 1510 09/11/17 1520  BP: 122/86 (!) 160/82 (!) 141/81 (!) 141/82  Pulse:  77 79 81  Resp: (!) 23 (!) 24 20 19   Temp: 97.8 F (36.6 C) 98.4 F (36.9 C)    TempSrc: Oral Oral    SpO2: 92% 96% 94% 94%  Weight: 114.8 kg (253 lb)     Height: 5\' 10"  (1.778 m)       Intake/Output Summary (Last 24 hours) at 09/11/2017 1540 Last data filed at 09/11/2017 1502 Gross per 24 hour  Intake 720 ml  Output 1050 ml  Net -330 ml   Filed Weights   09/09/17 0500 09/11/17 0545 09/11/17 1200  Weight: 117.8 kg (259 lb 12.8 oz) 114.9 kg (253 lb 3.2 oz) 114.8 kg (253 lb)    Examination:  General exam: Appears calm and comfortable  Respiratory system: Clear to auscultation. Respiratory effort normal. Cardiovascular system: S1 & S2 heard, RRR. No JVD, murmurs, rubs, gallops or clicks. No pedal edema. Gastrointestinal system: Abdomen is nondistended, soft and nontender. No organomegaly or masses felt. Normal bowel sounds heard. Central nervous system: Alert and oriented. No focal neurological deficits. Extremities: Symmetric 5 x 5 power. Skin: No rashes, lesions or ulcers Psychiatry: Judgement and insight appear normal. Mood & affect appropriate.     Data Reviewed: I have personally reviewed following labs and imaging studies  CBC: Recent Labs  Lab 09/06/17 0532 09/07/17 0408 09/08/17 0442 09/09/17 0453 09/10/17 0357  WBC 12.7* 10.8* 9.8 11.8* 9.5  HGB 10.9* 11.1* 10.9* 10.9* 10.9*  HCT 32.2* 32.3* 32.2* 32.4* 33.2*  MCV 91.7 90.5 91.5 93.1 91.7  PLT 148* 155 150 182 671   Basic Metabolic Panel: Recent Labs  Lab 09/06/17 0532 09/07/17 0408  09/08/17 0442 09/09/17 0453 09/10/17 0357  NA 131* 129* 131* 130* 130*  K 4.0 4.0 4.4 4.0 4.2  CL 97* 98* 96* 98* 98*  CO2 24 23 24 26 23   GLUCOSE 115* 106* 185* 115* 99  BUN 21* 16 19 22* 18  CREATININE 1.72* 1.61* 1.39* 1.34* 1.14  CALCIUM 8.4* 8.1* 8.5* 8.0* 8.1*   GFR: Estimated Creatinine Clearance: 89.6 mL/min (by C-G formula based on SCr of 1.14 mg/dL). Liver Function Tests: Recent Labs  Lab 09/06/17 0532 09/07/17 0408 09/08/17 0442 09/09/17 0453 09/10/17 0357  AST 32 128* 149* 160* 99*  ALT 35 140* 193* 252* 206*  ALKPHOS 68 72 84 87 80  BILITOT 1.2 1.7* 1.3* 0.8 1.0  PROT 6.3* 6.6 6.9 6.6 6.3*  ALBUMIN 3.2* 3.0* 2.8* 2.6* 2.7*   No results for input(s): LIPASE, AMYLASE in the last 168 hours. No results for input(s): AMMONIA in the last 168 hours. Coagulation Profile: No results for input(s): INR, PROTIME in the last 168 hours. Cardiac Enzymes: No results for  input(s): CKTOTAL, CKMB, CKMBINDEX, TROPONINI in the last 168 hours. BNP (last 3 results) No results for input(s): PROBNP in the last 8760 hours. HbA1C: No results for input(s): HGBA1C in the last 72 hours. CBG: No results for input(s): GLUCAP in the last 168 hours. Lipid Profile: No results for input(s): CHOL, HDL, LDLCALC, TRIG, CHOLHDL, LDLDIRECT in the last 72 hours. Thyroid Function Tests: No results for input(s): TSH, T4TOTAL, FREET4, T3FREE, THYROIDAB in the last 72 hours. Anemia Panel: No results for input(s): VITAMINB12, FOLATE, FERRITIN, TIBC, IRON, RETICCTPCT in the last 72 hours. Sepsis Labs: No results for input(s): PROCALCITON, LATICACIDVEN in the last 168 hours.  Recent Results (from the past 240 hour(s))  Surgical pcr screen     Status: Abnormal   Collection Time: 09/07/17  2:00 PM  Result Value Ref Range Status   MRSA, PCR NEGATIVE NEGATIVE Final   Staphylococcus aureus POSITIVE (A) NEGATIVE Final    Comment: (NOTE) The Xpert SA Assay (FDA approved for NASAL specimens in patients  32 years of age and older), is one component of a comprehensive surveillance program. It is not intended to diagnose infection nor to guide or monitor treatment. Performed at [facility], [place] [address] [place], [place] [address]          Radiology Studies: Nm Hepatobiliary Liver Func  Result Date: 09/09/2017 CLINICAL DATA:  57 year old male status post cholecystectomy on 09/06/2017 with persistent right upper quadrant pain. EXAM: NUCLEAR MEDICINE HEPATOBILIARY IMAGING TECHNIQUE: Sequential images of the abdomen were obtained out to 60 minutes following intravenous administration of radiopharmaceutical. RADIOPHARMACEUTICALS:  5 mCi Tc-47m  Choletec IV COMPARISON:  09/06/2017. FINDINGS: Prompt uptake and biliary excretion of activity by the liver is seen. Activity is visualized in the gallbladder fossa, followed by eventual spillage of activity beneath the right lobe of the liver into the upper right peritoneal cavity. Activity also extends into the small bowel via the common bile duct. IMPRESSION: 1. Study is positive for bile leak, as above. These results will be called to the ordering clinician or representative by the Radiologist Assistant, and communication documented in the PACS or zVision Dashboard. Electronically Signed   By: Vinnie Langton M.D.   On: 09/09/2017 18:13   Dg Ercp Biliary & Pancreatic Ducts  Result Date: 09/11/2017 CLINICAL DATA:  57 year old male status post unsuccessful ERCP EXAM: ERCP TECHNIQUE: Multiple spot images obtained with the fluoroscopic device and submitted for interpretation post-procedure. FLUOROSCOPY TIME:  Fluoroscopy Time:  1 minutes 45 seconds COMPARISON:  None. FINDINGS: A single saved intra procedural images submitted for review. The image demonstrates a flexible endoscope in the descending duodenum with wire cannulation of the distal common bile duct. IMPRESSION: Unsuccessful ERCP. These images were submitted for radiologic interpretation only.  Please see the procedural report for the amount of contrast and the fluoroscopy time utilized. Electronically Signed   By: Jacqulynn Cadet M.D.   On: 09/11/2017 15:31        Scheduled Meds: . allopurinol  100 mg Oral BID  . carvedilol  25 mg Oral BID WC  . docusate sodium  100 mg Oral BID  . enoxaparin (LOVENOX) injection  40 mg Subcutaneous Q24H  . famotidine  20 mg Oral Daily  . indomethacin  100 mg Rectal Once  . mupirocin ointment   Nasal BID  . pantoprazole  40 mg Oral BID AC  . simvastatin  40 mg Oral QHS   Continuous Infusions:   LOS: 2 days    Time spent:  32 minutes    Hilaria Titsworth A, MD [facility] Pager [phone]  If 7PM-7AM, please contact night-coverage [email or web address] Password [email or web address] 09/11/2017, 3:40 PM

## 2017-09-11 NOTE — Anesthesia Preprocedure Evaluation (Signed)

## 2017-09-11 NOTE — Anesthesia Postprocedure Evaluation (Signed)
Anesthesia Post Note  Patient: Nicholas Gray  Procedure(s) Performed: ENDOSCOPIC RETROGRADE CHOLANGIOPANCREATOGRAPHY (ERCP) (N/A )     Patient location during evaluation: Endoscopy Anesthesia Type: General Level of consciousness: awake and alert Pain management: pain level controlled Vital Signs Assessment: post-procedure vital signs reviewed and stable Respiratory status: spontaneous breathing, nonlabored ventilation, respiratory function stable and patient connected to nasal cannula oxygen Cardiovascular status: blood pressure returned to baseline and stable Postop Assessment: no apparent nausea or vomiting Anesthetic complications: no    Last Vitals:  Vitals:   09/11/17 1543 09/11/17 1720  BP: 135/77 133/68  Pulse:    Resp:    Temp:    SpO2:      Last Pain:  Vitals:   09/11/17 1721  TempSrc:   PainSc: 0-No pain                 Stayce Delancy COKER

## 2017-09-11 NOTE — Anesthesia Procedure Notes (Signed)
Procedure Name: Intubation Date/Time: 09/11/2017 1:15 PM Performed by: Cleda Daub, CRNA Pre-anesthesia Checklist: Patient identified, Emergency Drugs available, Suction available and Patient being monitored Patient Re-evaluated:Patient Re-evaluated prior to induction Oxygen Delivery Method: Circle system utilized Preoxygenation: Pre-oxygenation with 100% oxygen Induction Type: IV induction Ventilation: Mask ventilation without difficulty and Mask ventilation throughout procedure Laryngoscope Size: Mac and 3 Grade View: Grade I Tube type: Oral Tube size: 7.5 mm Number of attempts: 1 Airway Equipment and Method: Stylet Placement Confirmation: ETT inserted through vocal cords under direct vision,  positive ETCO2 and breath sounds checked- equal and bilateral Secured at: 21 cm Tube secured with: Tape Dental Injury: Teeth and Oropharynx as per pre-operative assessment

## 2017-09-11 NOTE — Progress Notes (Signed)
Spoke with Dr. Loletha Carrow, unable to cannulate the bile duct. Spoke with Dr. Anselm Pancoast about potential IR procedure. -will proceed with CT ab/pelv to evaluate for undrained collection and possibility to cannulate bile duct with IR

## 2017-09-11 NOTE — Progress Notes (Signed)
Initial Nutrition Assessment  DOCUMENTATION CODES:   Obesity unspecified  INTERVENTION:    RD to add PO supplements as needed when diet advanced  NUTRITION DIAGNOSIS:   Inadequate oral intake related to inability to eat as evidenced by NPO status.  GOAL:   Patient will meet greater than or equal to 90% of their needs  MONITOR:   Diet advancement, PO intake  REASON FOR ASSESSMENT:   Malnutrition Screening Tool    ASSESSMENT:   57 yo male with PMH of GERD, diverticulosis, HTN, HLD, cardiomyopathy, sleep apnea, and CHF who was admitted on 5/30 with chest pain, found to have bile leak.   Unable to speak with patient or complete Nutrition-Focused physical exam at this time. Patient is out of room for a procedure (ERCP).  Per chart review, patient has been NPO or on clear liquids for most of his hospital stay. He has had solid foods for a few days.  He reported weight loss and poor appetite on malnutrition screening tool completed today.    Labs reviewed. Sodium 130 (L) Medications reviewed and include colace, pepcid, protonix.  NUTRITION - FOCUSED PHYSICAL EXAM:  unable to complete NFPE at this time  Diet Order:   Diet Order           Diet NPO time specified  Diet effective now          EDUCATION NEEDS:   No education needs have been identified at this time  Skin:  Skin Assessment: Skin Integrity Issues: Skin Integrity Issues:: Incisions Incisions: abdomen  Last BM:  6/6  Height:   Ht Readings from Last 1 Encounters:  09/11/17 5\' 10"  (1.778 m)    Weight:   Wt Readings from Last 1 Encounters:  09/11/17 253 lb (114.8 kg)    Ideal Body Weight:  75.5 kg  BMI:  Body mass index is 36.3 kg/m.  Estimated Nutritional Needs:   Kcal:  2100-2300  Protein:  100-115 gm  Fluid:  2-2.3 L    Molli Barrows, RD, LDN, CNSC Pager [phone] After Hours Pager [phone]

## 2017-09-11 NOTE — Progress Notes (Signed)
Patient ID: Nicholas Gray, male   DOB: 11/08/1960, 57 y.o.   MRN: [id number]    4 Days Post-Op  Subjective: Feels better since mobilizing some and passing flatus.  Ready for ERCP today.  Objective: Vital signs in last 24 hours: Temp:  [97.7 F (36.5 C)-98.8 F (37.1 C)] 98.1 F (36.7 C) (06/07 0545) Pulse Rate:  [68-77] 77 (06/07 0545) Resp:  [18-20] 18 (06/07 0545) BP: (128-148)/(71-80) 148/80 (06/07 0545) SpO2:  [92 %-95 %] 94 % (06/07 0545) Weight:  [114.9 kg (253 lb 3.2 oz)] 114.9 kg (253 lb 3.2 oz) (06/07 0545) Last BM Date: 09/10/17  Intake/Output from previous day: 06/06 0701 - 06/07 0700 In: 420 [P.O.:420] Out: 850 [Urine:300; Drains:550] Intake/Output this shift: No intake/output data recorded.  PE: Abd: soft, less tender, +BS, JP drain with bilious output. 550cc documented yesterday.  Lab Results:  Recent Labs    09/09/17 0453 09/10/17 0357  WBC 11.8* 9.5  HGB 10.9* 10.9*  HCT 32.4* 33.2*  PLT 182 215   BMET Recent Labs    09/09/17 0453 09/10/17 0357  NA 130* 130*  K 4.0 4.2  CL 98* 98*  CO2 26 23  GLUCOSE 115* 99  BUN 22* 18  CREATININE 1.34* 1.14  CALCIUM 8.0* 8.1*   PT/INR No results for input(s): LABPROT, INR in the last 72 hours. CMP     Component Value Date/Time   NA 130 (L) 09/10/2017 0357   K 4.2 09/10/2017 0357   CL 98 (L) 09/10/2017 0357   CO2 23 09/10/2017 0357   GLUCOSE 99 09/10/2017 0357   BUN 18 09/10/2017 0357   CREATININE 1.14 09/10/2017 0357   CALCIUM 8.1 (L) 09/10/2017 0357   PROT 6.3 (L) 09/10/2017 0357   ALBUMIN 2.7 (L) 09/10/2017 0357   AST 99 (H) 09/10/2017 0357   ALT 206 (H) 09/10/2017 0357   ALKPHOS 80 09/10/2017 0357   BILITOT 1.0 09/10/2017 0357   GFRNONAA >60 09/10/2017 0357   GFRAA >60 09/10/2017 0357   Lipase     Component Value Date/Time   LIPASE 25 09/04/2017 0902       Studies/Results: Nm Hepatobiliary Liver Func  Result Date: 09/09/2017 CLINICAL DATA:  57 year old male status post  cholecystectomy on 09/06/2017 with persistent right upper quadrant pain. EXAM: NUCLEAR MEDICINE HEPATOBILIARY IMAGING TECHNIQUE: Sequential images of the abdomen were obtained out to 60 minutes following intravenous administration of radiopharmaceutical. RADIOPHARMACEUTICALS:  5 mCi Tc-72m  Choletec IV COMPARISON:  09/06/2017. FINDINGS: Prompt uptake and biliary excretion of activity by the liver is seen. Activity is visualized in the gallbladder fossa, followed by eventual spillage of activity beneath the right lobe of the liver into the upper right peritoneal cavity. Activity also extends into the small bowel via the common bile duct. IMPRESSION: 1. Study is positive for bile leak, as above. These results will be called to the ordering clinician or representative by the Radiologist Assistant, and communication documented in the PACS or zVision Dashboard. Electronically Signed   By: Vinnie Langton M.D.   On: 09/09/2017 18:13    Anti-infectives: Anti-infectives (From admission, onward)   Start     Dose/Rate Route Frequency Ordered Stop   09/05/17 2000  piperacillin-tazobactam (ZOSYN) IVPB 3.375 g     3.375 g 12.5 mL/hr over 240 Minutes Intravenous Every 8 hours 09/05/17 1908 09/08/17 2359       Assessment/Plan Chronic systolic CHF HTN OSA AKI  Acutegangrenouscholecystitis,  - s/p lap chole by Dr. Kieth Brightly, 09/07/17 Bile leak seen  on HIDA - GI for ERCP today. -may resume heart healthy diet after ERCP from our standpoint, -cont JP drain and will be discharged with this as well.  FEN -NPO for ERCP VTE -Lovenox/SCDs ID -Zosyn6/2 -->6/4 Foley: none Follow up: [place]: ERCP today   LOS: 2 days    Henreitta Cea , [facility] 09/11/2017, 9:25 AM Pager: [phone]

## 2017-09-11 NOTE — Transfer of Care (Signed)
Immediate Anesthesia Transfer of Care Note  Patient: Nicholas Gray  Procedure(s) Performed: ENDOSCOPIC RETROGRADE CHOLANGIOPANCREATOGRAPHY (ERCP) (N/A )  Patient Location: PACU  Anesthesia Type:General  Level of Consciousness: awake, alert , oriented and patient cooperative  Airway & Oxygen Therapy: Patient Spontanous Breathing and Patient connected to face mask oxygen  Post-op Assessment: Report given to RN and Post -op Vital signs reviewed and stable  Post vital signs: Reviewed and stable  Last Vitals:  Vitals Value Taken Time  BP 160/82 09/11/2017  3:05 PM  Temp 36.9 C 09/11/2017  3:04 PM  Pulse 79 09/11/2017  3:09 PM  Resp 20 09/11/2017  3:09 PM  SpO2 94 % 09/11/2017  3:09 PM  Vitals shown include unvalidated device data.  Last Pain:  Vitals:   09/11/17 1504  TempSrc: Oral  PainSc:       Patients Stated Pain Goal: 0 (49/70/26 3785)  Complications: No apparent anesthesia complications

## 2017-09-12 ENCOUNTER — Inpatient Hospital Stay (HOSPITAL_COMMUNITY): Payer: 59

## 2017-09-12 MED ORDER — LISINOPRIL 20 MG PO TABS
20.0000 mg | ORAL_TABLET | Freq: Two times a day (BID) | ORAL | Status: DC
  Administered 2017-09-12 – 2017-09-13 (×2): 20 mg via ORAL
  Filled 2017-09-12 (×2): qty 1

## 2017-09-12 MED ORDER — FUROSEMIDE 20 MG PO TABS: 20.0000 mg | ORAL_TABLET | Freq: Every day | ORAL | Status: DC | PRN

## 2017-09-12 NOTE — Progress Notes (Addendum)
Patient ID: Nicholas Gray, male   DOB: 11-02-1960, 57 y.o.   MRN: [id number]  Bile leak post Cholecystectomy 09/07/17  Unsuccessful ERCP-- unable to cannulate duct  CT:  IMPRESSION: Evidence of recent cholecystectomy. Percutaneous drain over the right abdomen with tip in the epigastric region. Small amount of fluid over the surgical bed of the gallbladder fossa with 3 mm stone projecting over the gallbladder fossa likely within the cystic duct.  Mild bibasilar atelectasis with small amount right pleural fluid.  Mild colonic diverticulosis.  Dr Anselm Pancoast has reviewed imaging Will await evaluation from Surgery team

## 2017-09-12 NOTE — Plan of Care (Signed)
  Problem: Clinical Measurements: Goal: Respiratory complications will improve Outcome: Progressing Goal: Cardiovascular complication will be avoided Outcome: Progressing   Problem: Pain Managment: Goal: General experience of comfort will improve Outcome: Progressing   Problem: Activity: Goal: Risk for activity intolerance will decrease Outcome: Progressing   Problem: Nutrition: Goal: Adequate nutrition will be maintained Outcome: Progressing   Problem: Coping: Goal: Level of anxiety will decrease Outcome: Progressing   Problem: Elimination: Goal: Will not experience complications related to bowel motility Outcome: Progressing Goal: Will not experience complications related to urinary retention Outcome: Progressing   Problem: Safety: Goal: Ability to remain free from injury will improve Outcome: Progressing

## 2017-09-12 NOTE — Progress Notes (Signed)
Patient ID: Nicholas Gray, male   DOB: 1960-05-14, 57 y.o.   MRN: [id number]   Dr Anselm Pancoast has spoken to [facility] Reviewed imaging and options Would agree since leak is controlled and no ductal dilatation would recommend IR to HOLD on any procedure for now. Probably in pt best interest to consider re attempt at ERCP at some pont if needed

## 2017-09-12 NOTE — Progress Notes (Signed)
Patient ID: Nicholas Gray, male   DOB: 1961/02/08, 57 y.o.   MRN: [id number] Patient ID: Nicholas Gray, male   DOB: 25-Jul-1960, 57 y.o.   MRN: [id number] Patient ID: Nicholas Gray, male   DOB: 08-10-1960, 57 y.o.   MRN: [id number]  PROGRESS NOTE    Nicholas Gray  GEX:[id number] DOB: 07-15-1960 DOA: 09/03/2017 PCP: [facility]   Brief Narrative: 57 year old Caucasian male with a past medical history of dilated cardiomyopathy with a EF of 25 to 30%, hypertension, obstructive sleep apnea, clean coronaries based on cardiac cath in 2010 presented with chest pain.  Patient was hospitalized for further management.  Patient was seen by cardiology.  He had a stress test which was intermediate risk.  He was also noted to have abdominal discomfort and pain.  He underwent ultrasound followed by CT scan and then a HIDA scan.  These tests suggest acute cholecystitis.  General surgery consulted.  Patient underwent laparoscopic cholecystectomy on 6/3.  Seems to be medically stable postoperatively however 6 /5 HIDA scan revealed a bile leak.  For ERCP and failed stent placement on 09/11/2017.   Assessment & Plan: Main problem  acute gangrenous  cholecystitis now with bile leak postoperatively  Principal Problem:   Chest pain, rule out acute myocardial infarction Active Problems:   Essential hypertension   Dilated cardiomyopathy (HCC)   LBBB (left bundle branch block)   OSA (obstructive sleep apnea)   Chronic systolic CHF (congestive heart failure) (HCC)   RUQ pain   Bile leak  Acute cholecystitis with postoperative bile leak Initially patient's LFTs were normal.  Right upper quadrant ultrasound did not show any biliary disease.  Due to persistent symptoms of CT scan of the abdomen pelvis was done which suggested acute cholecystitis.  General surgery was consulted.  Patient underwent HIDA scan which shows acute cholecystitis.  Patient was cleared by cardiology.  Patient  underwent laparoscopic cholecystectomy on 6/3.  Significantly inflamed gallbladder was noted with pus.  Gangrenous changes were noted in the cystic duct.  Continue Zosyn for now per general surgery.  LFTs are stable.  Patient has a drain due to high risk for biliary leak which we will continue at discharge.  Further management per general surgery.  Mobilize.    ERCP attempt yesterday with failed placement of stent. Further work-up per GI team and surgery team.  Overall patient clinically seems to be improving despite biliary leak found 3 days ago.  Chronic systolic CHF due to nonischemic cardiomyopathy Patient with mildly elevated troponins.  With history of LBBB. Seen by cardiology. Underwent stress test which was intermediate risk.  Patient cleared by cardiology for surgery.  He appears to be euvolemic.  There was some concern for hypovolemia due to rising creatinine as he was n.p.o. for almost 2 days.  Patient was gently hydrated.  Creatinine has improved.  We will discontinue fluids.    Will restart ACE inhibitor and Lasix at this time.  Acute renal failure Unclear if patient has a history of CKD.  Being climbed from 1.31-1.63 and then 1.72 now normal at 1.1.    Currently creatinine is normalized after holding diuretics ACE inhibitor and with IV fluids.  He is making urine.    Now have Stop IV fluids to avoid fluid overload.  He is tolerating his diet well.    Resume ACE inhibitor and Lasix at this time.  Essential hypertension Blood pressure is reasonably well controlled.  Continue with beta-blocker.  Resume ACE inhibitor  Obstructive sleep apnea CPAP.    DVT prophylaxis: Lovenox  Code Status: Full code  Family Communication: None  Disposition Plan: Per general surgery and GI  Consultants:   General surgery and cardiology and GI  Procedures: Status post ex lap on 09/07/2017  Stress test  There was no ST segment deviation noted during stress.  This is an intermediate risk  study.  The left ventricular ejection fraction is moderately decreased (30-44%).  1. Fixed medium-sized, moderate intensity mid to apical anteroseptal, mid to apical inferoseptal, and true apex perfusion defect. Prior infarction involving septum versus LBBB artifact. No significant ischemia.  2. EF 39% with septal dyskinesis.  Intermediate risk study because of low EF.       Subjective: Patient's pain is much better.  He is having bowel movements.  He is ambulating in the hallway.  He overall clinically is doing better over the last 48 hours.  Zosyn is continued due to bile leak.  Objective: Vitals:   09/11/17 1720 09/11/17 2006 09/12/17 0353 09/12/17 1417  BP: 133/68 125/65 (!) 148/85 132/70  Pulse:  66 70 70  Resp:  18 (!) 23   Temp:  98.7 F (37.1 C) 98.5 F (36.9 C)   TempSrc:  Oral Oral   SpO2:  94% 97% 98%  Weight:   113.9 kg (251 lb)   Height:        Intake/Output Summary (Last 24 hours) at 09/12/2017 1459 Last data filed at 09/12/2017 1000 Gross per 24 hour  Intake 1482 ml  Output 707 ml  Net 775 ml   Filed Weights   09/11/17 0545 09/11/17 1200 09/12/17 0353  Weight: 114.9 kg (253 lb 3.2 oz) 114.8 kg (253 lb) 113.9 kg (251 lb)    Examination:  General exam: Appears calm and comfortable  Respiratory system: Clear to auscultation. Respiratory effort normal. Cardiovascular system: S1 & S2 heard, RRR. No JVD, murmurs, rubs, gallops or clicks. No pedal edema. Gastrointestinal system: Abdomen is nondistended, soft and nontender. No organomegaly or masses felt. Normal bowel sounds heard. Central nervous system: Alert and oriented. No focal neurological deficits. Extremities: Symmetric 5 x 5 power. Skin: No rashes, lesions or ulcers Psychiatry: Judgement and insight appear normal. Mood & affect appropriate.     Data Reviewed: I have personally reviewed following labs and imaging studies  CBC: Recent Labs  Lab 09/06/17 0532 09/07/17 0408 09/08/17 0442  09/09/17 0453 09/10/17 0357  WBC 12.7* 10.8* 9.8 11.8* 9.5  HGB 10.9* 11.1* 10.9* 10.9* 10.9*  HCT 32.2* 32.3* 32.2* 32.4* 33.2*  MCV 91.7 90.5 91.5 93.1 91.7  PLT 148* 155 150 182 778   Basic Metabolic Panel: Recent Labs  Lab 09/06/17 0532 09/07/17 0408 09/08/17 0442 09/09/17 0453 09/10/17 0357  NA 131* 129* 131* 130* 130*  K 4.0 4.0 4.4 4.0 4.2  CL 97* 98* 96* 98* 98*  CO2 24 23 24 26 23   GLUCOSE 115* 106* 185* 115* 99  BUN 21* 16 19 22* 18  CREATININE 1.72* 1.61* 1.39* 1.34* 1.14  CALCIUM 8.4* 8.1* 8.5* 8.0* 8.1*   GFR: Estimated Creatinine Clearance: 89.3 mL/min (by C-G formula based on SCr of 1.14 mg/dL). Liver Function Tests: Recent Labs  Lab 09/06/17 0532 09/07/17 0408 09/08/17 0442 09/09/17 0453 09/10/17 0357  AST 32 128* 149* 160* 99*  ALT 35 140* 193* 252* 206*  ALKPHOS 68 72 84 87 80  BILITOT 1.2 1.7* 1.3* 0.8 1.0  PROT 6.3* 6.6 6.9 6.6 6.3*  ALBUMIN 3.2* 3.0* 2.8* 2.6* 2.7*   No results for input(s): LIPASE, AMYLASE in the last 168 hours. No results for input(s): AMMONIA in the last 168 hours. Coagulation Profile: No results for input(s): INR, PROTIME in the last 168 hours. Cardiac Enzymes: No results for input(s): CKTOTAL, CKMB, CKMBINDEX, TROPONINI in the last 168 hours. BNP (last 3 results) No results for input(s): PROBNP in the last 8760 hours. HbA1C: No results for input(s): HGBA1C in the last 72 hours. CBG: No results for input(s): GLUCAP in the last 168 hours. Lipid Profile: No results for input(s): CHOL, HDL, LDLCALC, TRIG, CHOLHDL, LDLDIRECT in the last 72 hours. Thyroid Function Tests: No results for input(s): TSH, T4TOTAL, FREET4, T3FREE, THYROIDAB in the last 72 hours. Anemia Panel: No results for input(s): VITAMINB12, FOLATE, FERRITIN, TIBC, IRON, RETICCTPCT in the last 72 hours. Sepsis Labs: No results for input(s): PROCALCITON, LATICACIDVEN in the last 168 hours.  Recent Results (from the past 240 hour(s))  Surgical pcr  screen     Status: Abnormal   Collection Time: 09/07/17  2:00 PM  Result Value Ref Range Status   MRSA, PCR NEGATIVE NEGATIVE Final   Staphylococcus aureus POSITIVE (A) NEGATIVE Final    Comment: (NOTE) The Xpert SA Assay (FDA approved for NASAL specimens in patients 5 years of age and older), is one component of a comprehensive surveillance program. It is not intended to diagnose infection nor to guide or monitor treatment. Performed at [facility], [place] [address], [place], [place] [address]          Radiology Studies: Ct Abdomen Pelvis W Contrast  Result Date: 09/12/2017 CLINICAL DATA:  Evaluate possible fistula of bile duct. EXAM: CT ABDOMEN AND PELVIS WITH CONTRAST TECHNIQUE: Multidetector CT imaging of the abdomen and pelvis was performed using the standard protocol following bolus administration of intravenous contrast. CONTRAST:  169mL OMNIPAQUE IOHEXOL 300 MG/ML  SOLN COMPARISON:  09/05/2017 FINDINGS: Lower chest: Minimal bibasilar atelectasis and tiny amount right pleural fluid. Few tiny right pericardial phrenic lymph nodes slightly more prominent. Hepatobiliary: Recent cholecystectomy. Tiny 3 mm stone seen over the gallbladder fossa which may be within the cystic duct. Liver is otherwise within normal. Common bile duct is unremarkable. There is a percutaneous drainage catheter extending from the right abdomen coursing to have tip in the epigastric region. Minimal fluid over the surgical bed of the gallbladder fossa. Pancreas: Normal. Spleen: Normal. Adrenals/Urinary Tract: Adrenal glands are normal. Kidneys are normal in size without hydronephrosis. No significant nephrolithiasis. Ureters and bladder are normal. Stomach/Bowel: Stomach and small bowel are within normal. Appendix is not visualized. Mild diverticulosis of the colon. Vascular/Lymphatic: Within normal. Reproductive: Normal. Other: No significant free peritoneal fluid or focal inflammatory change. Musculoskeletal:  Mild degenerate change of the spine. IMPRESSION: Evidence of recent cholecystectomy. Percutaneous drain over the right abdomen with tip in the epigastric region. Small amount of fluid over the surgical bed of the gallbladder fossa with 3 mm stone projecting over the gallbladder fossa likely within the cystic duct. Mild bibasilar atelectasis with small amount right pleural fluid. Mild colonic diverticulosis. Electronically Signed   By: Marin Olp M.D.   On: 09/12/2017 03:00   Dg Ercp Biliary & Pancreatic Ducts  Result Date: 09/11/2017 CLINICAL DATA:  57 year old male status post unsuccessful ERCP EXAM: ERCP TECHNIQUE: Multiple spot images obtained with the fluoroscopic device and submitted for interpretation post-procedure. FLUOROSCOPY TIME:  Fluoroscopy Time:  1 minutes 45 seconds COMPARISON:  None. FINDINGS: A single saved intra procedural  images submitted for review. The image demonstrates a flexible endoscope in the descending duodenum with wire cannulation of the distal common bile duct. IMPRESSION: Unsuccessful ERCP. These images were submitted for radiologic interpretation only. Please see the procedural report for the amount of contrast and the fluoroscopy time utilized. Electronically Signed   By: Jacqulynn Cadet M.D.   On: 09/11/2017 15:31        Scheduled Meds: . allopurinol  100 mg Oral BID  . carvedilol  25 mg Oral BID WC  . docusate sodium  100 mg Oral BID  . enoxaparin (LOVENOX) injection  40 mg Subcutaneous Q24H  . famotidine  20 mg Oral Daily  . indomethacin  100 mg Rectal Once  . mupirocin ointment   Nasal BID  . pantoprazole  40 mg Oral BID AC  . simvastatin  40 mg Oral QHS   Continuous Infusions:   LOS: 3 days    Time spent: 31 minutes    Lawonda Pretlow A, MD [facility] Pager [phone]  If 7PM-7AM, please contact night-coverage [email or web address] Password [email or web address] 09/12/2017, 2:59 PM

## 2017-09-12 NOTE — Progress Notes (Signed)
Physical Therapy Treatment Patient Details Name: Nicholas Gray MRN: [id number] DOB: Oct 19, 1960 Today's Date: 09/12/2017    History of Present Illness Admit with Acute gangrenous cholecystitis needing cholecystectomy.  Hx CHF, HTN.     PT Comments    Patient progressing with therapy this visit, ambulating unit without AD or LOB, mild unsteadiness due to weakness noted. Pt without c/o of dizziness with sitting and standing and did not become symptomatic during session. No concerns for safe return home from PT standpoint once medically ready. Discussed importance of ambulating and regaining strength and balance back, pt verbalizes understanding.        Follow Up Recommendations  Supervision - Intermittent;No PT follow up     Equipment Recommendations  Other (comment)    Recommendations for Other Services       Precautions / Restrictions Precautions Precautions: Fall Precaution Comments: JP drain Restrictions Weight Bearing Restrictions: No    Mobility  Bed Mobility Overal bed mobility: Needs Assistance Bed Mobility: Supine to Sit;Sit to Supine     Supine to sit: Supervision Sit to supine: Supervision   General bed mobility comments: No dizziness with sitting or standing today  Transfers Overall transfer level: Needs assistance Equipment used: Rolling walker (2 wheeled) Transfers: Sit to/from Stand Sit to Stand: Supervision;Min guard         General transfer comment: pt able to stand with min guard sit with supervision   Ambulation/Gait Ambulation/Gait assistance: Min guard;Supervision Ambulation Distance (Feet): 180 Feet Assistive device: Rolling walker (2 wheeled) Gait Pattern/deviations: Step-through pattern;Decreased stride length Gait velocity: decreased   General Gait Details: patient amblulating without AD or LOB   Stairs             Wheelchair Mobility    Modified Rankin (Stroke Patients Only)       Balance Overall balance  assessment: Needs assistance Sitting-balance support: No upper extremity supported;Feet supported Sitting balance-Leahy Scale: Good     Standing balance support: Bilateral upper extremity supported;During functional activity Standing balance-Leahy Scale: Fair Standing balance comment: walking without AD                            Cognition Arousal/Alertness: Awake/alert Behavior During Therapy: WFL for tasks assessed/performed Overall Cognitive Status: Within Functional Limits for tasks assessed                                        Exercises      General Comments        Pertinent Vitals/Pain Pain Assessment: 0-10 Pain Score: 5  Pain Location: abdomen, drain site Pain Descriptors / Indicators: Grimacing;Guarding Pain Intervention(s): Limited activity within patient's tolerance;Monitored during session;Repositioned    Home Living                      Prior Function            PT Goals (current goals can now be found in the care plan section) Acute Rehab PT Goals Patient Stated Goal: to go home PT Goal Formulation: With patient Time For Goal Achievement: 09/23/17 Potential to Achieve Goals: Good Progress towards PT goals: Progressing toward goals    Frequency    Min 3X/week      PT Plan Current plan remains appropriate    Co-evaluation  AM-PAC PT "6 Clicks" Daily Activity  Outcome Measure  Difficulty turning over in bed (including adjusting bedclothes, sheets and blankets)?: None Difficulty moving from lying on back to sitting on the side of the bed? : None Difficulty sitting down on and standing up from a chair with arms (e.g., wheelchair, bedside commode, etc,.)?: None Help needed moving to and from a bed to chair (including a wheelchair)?: A Little Help needed walking in hospital room?: A Little Help needed climbing 3-5 steps with a railing? : A Little 6 Click Score: 21    End of Session  Equipment Utilized During Treatment: Gait belt Activity Tolerance: Patient tolerated treatment well Patient left: with call bell/phone within reach;in bed;with family/visitor present Nurse Communication: Mobility status PT Visit Diagnosis: Unsteadiness on feet (R26.81);Dizziness and giddiness (R42);Muscle weakness (generalized) (M62.81)     Time: 2103-1281 PT Time Calculation (min) (ACUTE ONLY): 23 min  Charges:  $Gait Training: 8-22 mins                    G Codes:       Reinaldo Berber, PT, DPT Acute Rehab Services Pager: [phone]     Reinaldo Berber 09/12/2017, 11:29 AM

## 2017-09-12 NOTE — Plan of Care (Signed)
Denies Pain. Ambulates in hallway. IS given.

## 2017-09-12 NOTE — Progress Notes (Signed)
1 Day Post-Op   Subjective/Chief Complaint: No complaints   Objective: Vital signs in last 24 hours: Temp:  [97.8 F (36.6 C)-98.7 F (37.1 C)] 98.5 F (36.9 C) (06/08 0353) Pulse Rate:  [66-81] 70 (06/08 0353) Resp:  [18-24] 23 (06/08 0353) BP: (122-160)/(65-86) 148/85 (06/08 0353) SpO2:  [92 %-97 %] 97 % (06/08 0353) Weight:  [113.9 kg (251 lb)-114.8 kg (253 lb)] 113.9 kg (251 lb) (06/08 0353) Last BM Date: 09/11/17  Intake/Output from previous day: 06/07 0701 - 06/08 0700 In: 1482 [P.O.:1182; I.V.:300] Out: 1055 [Urine:800; Drains:255] Intake/Output this shift: No intake/output data recorded.  General appearance: alert and cooperative Resp: clear to auscultation bilaterally Cardio: regular rate and rhythm GI: soft, nontender. drain output bilious  Lab Results:  Recent Labs    09/10/17 0357  WBC 9.5  HGB 10.9*  HCT 33.2*  PLT 215   BMET Recent Labs    09/10/17 0357  NA 130*  K 4.2  CL 98*  CO2 23  GLUCOSE 99  BUN 18  CREATININE 1.14  CALCIUM 8.1*   PT/INR No results for input(s): LABPROT, INR in the last 72 hours. ABG No results for input(s): PHART, HCO3 in the last 72 hours.  Invalid input(s): PCO2, PO2  Studies/Results: Ct Abdomen Pelvis W Contrast  Result Date: 09/12/2017 CLINICAL DATA:  Evaluate possible fistula of bile duct. EXAM: CT ABDOMEN AND PELVIS WITH CONTRAST TECHNIQUE: Multidetector CT imaging of the abdomen and pelvis was performed using the standard protocol following bolus administration of intravenous contrast. CONTRAST:  188mL OMNIPAQUE IOHEXOL 300 MG/ML  SOLN COMPARISON:  09/05/2017 FINDINGS: Lower chest: Minimal bibasilar atelectasis and tiny amount right pleural fluid. Few tiny right pericardial phrenic lymph nodes slightly more prominent. Hepatobiliary: Recent cholecystectomy. Tiny 3 mm stone seen over the gallbladder fossa which may be within the cystic duct. Liver is otherwise within normal. Common bile duct is unremarkable.  There is a percutaneous drainage catheter extending from the right abdomen coursing to have tip in the epigastric region. Minimal fluid over the surgical bed of the gallbladder fossa. Pancreas: Normal. Spleen: Normal. Adrenals/Urinary Tract: Adrenal glands are normal. Kidneys are normal in size without hydronephrosis. No significant nephrolithiasis. Ureters and bladder are normal. Stomach/Bowel: Stomach and small bowel are within normal. Appendix is not visualized. Mild diverticulosis of the colon. Vascular/Lymphatic: Within normal. Reproductive: Normal. Other: No significant free peritoneal fluid or focal inflammatory change. Musculoskeletal: Mild degenerate change of the spine. IMPRESSION: Evidence of recent cholecystectomy. Percutaneous drain over the right abdomen with tip in the epigastric region. Small amount of fluid over the surgical bed of the gallbladder fossa with 3 mm stone projecting over the gallbladder fossa likely within the cystic duct. Mild bibasilar atelectasis with small amount right pleural fluid. Mild colonic diverticulosis. Electronically Signed   By: Marin Olp M.D.   On: 09/12/2017 03:00   Dg Ercp Biliary & Pancreatic Ducts  Result Date: 09/11/2017 CLINICAL DATA:  57 year old male status post unsuccessful ERCP EXAM: ERCP TECHNIQUE: Multiple spot images obtained with the fluoroscopic device and submitted for interpretation post-procedure. FLUOROSCOPY TIME:  Fluoroscopy Time:  1 minutes 45 seconds COMPARISON:  None. FINDINGS: A single saved intra procedural images submitted for review. The image demonstrates a flexible endoscope in the descending duodenum with wire cannulation of the distal common bile duct. IMPRESSION: Unsuccessful ERCP. These images were submitted for radiologic interpretation only. Please see the procedural report for the amount of contrast and the fluoroscopy time utilized. Electronically Signed   By: Myrle Sheng  Laurence Ferrari M.D.   On: 09/11/2017 15:31     Anti-infectives: Anti-infectives (From admission, onward)   Start     Dose/Rate Route Frequency Ordered Stop   09/11/17 1145  ampicillin-sulbactam (UNASYN) 1.5 g in sodium chloride 0.9 % 100 mL IVPB     1.5 g 200 mL/hr over 30 Minutes Intravenous  Once 09/11/17 1143 09/11/17 1900   09/05/17 2000  piperacillin-tazobactam (ZOSYN) IVPB 3.375 g     3.375 g 12.5 mL/hr over 240 Minutes Intravenous Every 8 hours 09/05/17 1908 09/08/17 2359      Assessment/Plan: s/p Procedure(s): ENDOSCOPIC RETROGRADE CHOLANGIOPANCREATOGRAPHY (ERCP) (N/A) ERCP unsuccessful  I doubt IR would be able to stent across area since ducts are not dilated but will defer to them Since he is not ill appearing and leak seems well drained, another option is to continue the drain and give it time to see if leak will resolve on its own. If not another attempt at ERCP may be easier in a couple weeks when some of the swelling has gone down. Will follow. If no procedures planned for today then would advance diet  LOS: 3 days    TOTH III,PAUL S 09/12/2017

## 2017-09-13 ENCOUNTER — Encounter (HOSPITAL_COMMUNITY): Payer: Self-pay | Admitting: Gastroenterology

## 2017-09-13 MED ORDER — ACETAMINOPHEN 500 MG PO TABS: 1000.0000 mg | ORAL_TABLET | Freq: Four times a day (QID) | ORAL | 0 refills | Status: DC | PRN

## 2017-09-13 NOTE — Discharge Instructions (Signed)
[facility] - DISCHARGE INSTRUCTIONS TO PATIENT  Activity:  Driving - May drive, if doing well  Wound Care:   May shower.       Empty and record the drain output.  Empty at least twice a day  Diet:  As tolerated  Follow up appointment:  Call Dr. Amie Portland office [facility]) at [phone] for an appointment in 1 to 2 weeks.  Medications and dosages:  Resume your home medications.  Call Dr. Kieth Brightly or his office  [phone]) if you have:  Temperature greater than 100.4,  Persistent nausea and vomiting,  Severe uncontrolled pain,  Redness, tenderness, or signs of infection (pain, swelling, redness, odor or green/yellow discharge around the site),  Any other questions or concerns you may have after discharge.  In an emergency, call 911 or go to an Emergency Department at a nearby hospital.

## 2017-09-13 NOTE — Progress Notes (Addendum)
2 Days Post-Op   Subjective/Chief Complaint: Tolerating diet. Anxious to go home.  Objective: Vital signs in last 24 hours: Temp:  [98.1 F (36.7 C)-98.6 F (37 C)] 98.1 F (36.7 C) (06/09 0453) Pulse Rate:  [66-70] 66 (06/09 0453) Resp:  [18-26] 18 (06/09 0453) BP: (114-132)/(67-75) 114/67 (06/09 0453) SpO2:  [97 %-98 %] 98 % (06/09 0453) Weight:  [111.9 kg (246 lb 11.2 oz)] 111.9 kg (246 lb 11.2 oz) (06/09 0453) Last BM Date: 09/12/17  Intake/Output from previous day: 06/08 0701 - 06/09 0700 In: 240 [P.O.:240] Out: 117 [Drains:117] Intake/Output this shift: No intake/output data recorded.  General appearance: alert and cooperative Resp: clear to auscultation bilaterally Cardio: regular rate and rhythm GI: soft, nontender. drain output bilious  Lab Results:  No results for input(s): WBC, HGB, HCT, PLT in the last 72 hours. BMET No results for input(s): NA, K, CL, CO2, GLUCOSE, BUN, CREATININE, CALCIUM in the last 72 hours. PT/INR No results for input(s): LABPROT, INR in the last 72 hours. ABG No results for input(s): PHART, HCO3 in the last 72 hours.  Invalid input(s): PCO2, PO2  Studies/Results: Ct Abdomen Pelvis W Contrast  Result Date: 09/12/2017 CLINICAL DATA:  Evaluate possible fistula of bile duct. EXAM: CT ABDOMEN AND PELVIS WITH CONTRAST TECHNIQUE: Multidetector CT imaging of the abdomen and pelvis was performed using the standard protocol following bolus administration of intravenous contrast. CONTRAST:  186mL OMNIPAQUE IOHEXOL 300 MG/ML  SOLN COMPARISON:  09/05/2017 FINDINGS: Lower chest: Minimal bibasilar atelectasis and tiny amount right pleural fluid. Few tiny right pericardial phrenic lymph nodes slightly more prominent. Hepatobiliary: Recent cholecystectomy. Tiny 3 mm stone seen over the gallbladder fossa which may be within the cystic duct. Liver is otherwise within normal. Common bile duct is unremarkable. There is a percutaneous drainage catheter  extending from the right abdomen coursing to have tip in the epigastric region. Minimal fluid over the surgical bed of the gallbladder fossa. Pancreas: Normal. Spleen: Normal. Adrenals/Urinary Tract: Adrenal glands are normal. Kidneys are normal in size without hydronephrosis. No significant nephrolithiasis. Ureters and bladder are normal. Stomach/Bowel: Stomach and small bowel are within normal. Appendix is not visualized. Mild diverticulosis of the colon. Vascular/Lymphatic: Within normal. Reproductive: Normal. Other: No significant free peritoneal fluid or focal inflammatory change. Musculoskeletal: Mild degenerate change of the spine. IMPRESSION: Evidence of recent cholecystectomy. Percutaneous drain over the right abdomen with tip in the epigastric region. Small amount of fluid over the surgical bed of the gallbladder fossa with 3 mm stone projecting over the gallbladder fossa likely within the cystic duct. Mild bibasilar atelectasis with small amount right pleural fluid. Mild colonic diverticulosis. Electronically Signed   By: Marin Olp M.D.   On: 09/12/2017 03:00   Dg Ercp Biliary & Pancreatic Ducts  Result Date: 09/11/2017 CLINICAL DATA:  57 year old male status post unsuccessful ERCP EXAM: ERCP TECHNIQUE: Multiple spot images obtained with the fluoroscopic device and submitted for interpretation post-procedure. FLUOROSCOPY TIME:  Fluoroscopy Time:  1 minutes 45 seconds COMPARISON:  None. FINDINGS: A single saved intra procedural images submitted for review. The image demonstrates a flexible endoscope in the descending duodenum with wire cannulation of the distal common bile duct. IMPRESSION: Unsuccessful ERCP. These images were submitted for radiologic interpretation only. Please see the procedural report for the amount of contrast and the fluoroscopy time utilized. Electronically Signed   By: Jacqulynn Cadet M.D.   On: 09/11/2017 15:31    Anti-infectives: Anti-infectives (From admission,  onward)   Start  Dose/Rate Route Frequency Ordered Stop   09/11/17 1145  ampicillin-sulbactam (UNASYN) 1.5 g in sodium chloride 0.9 % 100 mL IVPB     1.5 g 200 mL/hr over 30 Minutes Intravenous  Once 09/11/17 1143 09/11/17 1900   09/05/17 2000  piperacillin-tazobactam (ZOSYN) IVPB 3.375 g     3.375 g 12.5 mL/hr over 240 Minutes Intravenous Every 8 hours 09/05/17 1908 09/08/17 2359      Assessment/Plan: 1.  Bile leak  Lap chole - 09/07/2017 - Kinsinger  ERCP unsuccessful - 09/11/2017 - Danis  Since he is not ill appearing and leak seems well drained, will continue the drain and give it time to see if leak will resolve on its own.   117 cc recorded in JP over last 24 hours.  Okay to discharge from our standpoint.  Follow up with Dr. Kieth Brightly in 1 to 2 weeks.  2.  Cardiomyopathy/chronic congestive heart failure 3.  HTN 4.  OSA 5.  DVT proph - Lovenox   LOS: 4 days    Shann Medal 09/13/2017

## 2017-09-13 NOTE — Plan of Care (Signed)
Discharged to home. Education complete for home care instructions for jp drain and follow up.

## 2017-09-13 NOTE — Discharge Summary (Signed)
Physician Discharge Summary  BREVON DEWALD PYP:[id number] DOB: 1960/06/20 DOA: 09/03/2017  PCP: [facility]  Admit date: 09/03/2017 Discharge date: 09/13/2017  Time spent: 45 minutes  Recommendations for Outpatient Follow-up:  1. Dr. Alvino Blood with general surgery on 09/24/2017 at 3 PM 2. Follow-up with PCP in 1 week.  Discharge Diagnoses:  Principal Problem:   Chest pain, rule out acute myocardial infarction Active Problems:   Essential hypertension   Dilated cardiomyopathy (HCC)   LBBB (left bundle branch block)   OSA (obstructive sleep apnea)   Chronic systolic CHF (congestive heart failure) (HCC)   RUQ pain   Bile leak   Discharge Condition: Stable and improved  Diet recommendation: Healthy heart diet  Filed Weights   09/11/17 1200 09/12/17 0353 09/13/17 0453  Weight: 114.8 kg (253 lb) 113.9 kg (251 lb) 111.9 kg (246 lb 11.2 oz)    History of present illness:  57 year old Caucasian male with a past medical history of dilated cardiomyopathy with a EF of 25 to 30%, hypertension, obstructive sleep apnea, clean coronaries based on cardiac cath in 2010 presented with chest pain. Patient was hospitalized for further management. Patient was seen by cardiology. He had a stress test which was intermediate risk. He was also noted to have abdominal discomfort and pain. He underwent ultrasound followed by CT scan and then a HIDA scan. These tests suggest acute cholecystitis. General surgery consulted.Patient underwent laparoscopic cholecystectomy on 6/3 and found to have gangrenous gallbladder. Seems to be medically stable postoperatively however 6 /5 HIDA scan revealed a bile leak.  For ERCP and failed stent placement on 09/11/2017.    Hospital Course:  Acute cholecystitis with postoperative bile leak Initially patient's LFTs were normal. Right upper quadrant ultrasound did not show any biliary disease. Due to persistent symptoms of CT scan of the  abdomen pelvis was done which suggested acute cholecystitis. General surgery was consulted. Patient underwent HIDA scan which shows acute cholecystitis. Patient was cleared by cardiology. Patient underwent laparoscopic cholecystectomy on 6/3. Significantly inflamed gallbladder was noted with pus. Gangrenous changes were noted in the cystic duct. Zosyn was continued for 3 days and then stopped  On 09/08/2017.  LFTs are stable.  ERCP attempt 09/11/2017 with failed placement of stent.  At this point it was considered for IR to put in drain however due to the fact that patient clinically was doing remarkably well it was decided to manage conservatively.  Patient will be discharged with a drain in place with close follow-up with general surgery.  He has had marked improvement of his pain over the last several days.  His diet has been advanced and is tolerating diet.  He has been ambulating the hallways without any difficulty.  He is having normal bowel movements.  Patient is instructed that if he have any fever or worsening pain to return to the emergency department or call his surgeon.  General surgery will be considering a couple weeks to repeat ERCP if indicated at that time.   Chronic systolic CHF due to nonischemic cardiomyopathy Patient with mildly elevated troponins. With history of LBBB. Seen by cardiology. Underwent stress test which was intermediate risk. Patient cleared by cardiology for surgery.  There was some concern for hypovolemia due to rising creatinine as he was n.p.o. for almost 2 days. Patient was gently hydrated. Creatinine has improved. ACE inhibitor and Lasix resumed 2 days ago.  Continue at discharge.  Acute renal failure Unclear if patient has a history of CKD. Being climbed from  1.31-1.63 and then 1.72 now normal at 1.1.   Currently creatinine is normalized after holding diuretics ACE inhibitor and with IV fluids in the perioperative.Marland KitchenHe is making urine.     ACE  inhibitor and Lasix have been resumed at this point.  Essential hypertension Blood pressure is reasonably well controlled. Continue with beta-blocker.   Resume ACE inhibitor  Obstructive sleep apnea CPAP.     Procedures: Status post ex lap on 09/07/2017 Status post ERCP with failed attempt at stenting on 09/11/2017  Consultations:  General surgery  Cardiology  Gastroenterology  Discharge Exam: Vitals:   09/12/17 2014 09/13/17 0453  BP: 124/75 114/67  Pulse: 70 66  Resp: (!) 26 18  Temp: 98.6 F (37 C) 98.1 F (36.7 C)  SpO2: 97% 98%    General: Alert and oriented x4 no apparent distress cooperative and friendly Cardiovascular: Regular rate and rhythm without murmurs rubs or gallops Respiratory: Clear to auscultation bilaterally no wheezes rhonchi rales  Discharge Instructions   Discharge Instructions    Call MD for:  redness, tenderness, or signs of infection (pain, swelling, redness, odor or green/yellow discharge around incision site)   Complete by:  As directed    Call MD for:  severe uncontrolled pain   Complete by:  As directed    Call MD for:  temperature >100.4   Complete by:  As directed    Diet - low sodium heart healthy   Complete by:  As directed    Increase activity slowly   Complete by:  As directed      Allergies as of 09/13/2017   No Known Allergies     Medication List    TAKE these medications   acetaminophen 500 MG tablet Commonly known as:  TYLENOL Take 2 tablets (1,000 mg total) by mouth every 6 (six) hours as needed for mild pain or headache.   allopurinol 100 MG tablet Commonly known as:  ZYLOPRIM Take 100 mg by mouth 2 (two) times daily.   carvedilol 25 MG tablet Commonly known as:  COREG TAKE (1) TABLET BY MOUTH TWICE DAILY WITH A MEAL.   furosemide 20 MG tablet Commonly known as:  LASIX Take 20 mg by mouth daily as needed (swelling).   lisinopril 10 MG tablet Commonly known as:  PRINIVIL,ZESTRIL TAKE 2 TABLETS BY  MOUTH TWICE DAILY.   pantoprazole 40 MG tablet Commonly known as:  PROTONIX Take 1 tablet (40 mg total) by mouth 2 (two) times daily before a meal for 14 days.   ranitidine 150 MG tablet Commonly known as:  ZANTAC Take 150 mg by mouth daily.   simvastatin 40 MG tablet Commonly known as:  ZOCOR Take 40 mg by mouth at bedtime.      No Known Allergies Follow-up Information    Larey Dresser, MD Follow up.   Specialty:  Cardiology Why:  You have an appointment with the Advanced Heart Failure clinic on 09/11/17 at 9:40. Please arrive 15 minutes early for check in. Please call office for parking directions. The parking code for June is 1300.  Contact information: [place] [address] [phone]        Kinsinger, Arta Bruce, MD Follow up on 09/24/2017.   Specialty:  General Surgery Why:  3:00pm, arrive by 2:30pm for check in and paperwork Contact information: [address] [place] [address] [phone]        [facility], [place] Follow up in 1 week(s).   Specialty:  Family Medicine Contact information: [address] [place] [address] [phone]        Minus Breeding, MD .   Specialty:  Cardiology Contact information: [address] [place] [address] [phone]            The results of significant diagnostics from this hospitalization (including imaging, microbiology, ancillary and laboratory) are listed below for reference.    Significant Diagnostic Studies: Ct Abdomen Pelvis Wo Contrast  Result Date: 09/05/2017 CLINICAL DATA:  57 year old male with history of abdominal pain. Gastroenteritis or colitis is suspected. EXAM: CT ABDOMEN AND PELVIS WITHOUT CONTRAST TECHNIQUE: Multidetector CT imaging of the abdomen and pelvis was performed following the standard protocol without IV contrast. COMPARISON:  No priors. FINDINGS: Lower chest: Linear areas of scarring or atelectasis are  noted in the lower lobes of the lungs bilaterally. Hepatobiliary: Diffuse low attenuation throughout the visualized hepatic parenchyma, indicative of severe hepatic steatosis. No definite cystic or solid hepatic lesions are confidently identified on today's noncontrast CT examination. There is a gallstone in the region of the neck of the gallbladder or cystic duct. The gallbladder is only moderately distended, but the gallbladder wall appears thickened and there are extensive surrounding inflammatory changes, concerning for an acute cholecystitis. Pancreas: No definite pancreatic mass or peripancreatic fluid or inflammatory changes are noted on today's noncontrast CT examination. Spleen: Unremarkable. Adrenals/Urinary Tract: Unenhanced appearance of the kidneys and bilateral adrenal glands is normal. No hydroureteronephrosis. No nephrolithiasis. Urinary bladder is normal in appearance. Stomach/Bowel: Unenhanced appearance of the stomach is normal. No pathologic dilatation of small bowel or colon. Inflammatory changes are noted adjacent to the second portion of the duodenum, likely related to the acute process in the gallbladder. Numerous colonic diverticulae are noted, without surrounding inflammatory changes to suggest an acute diverticulitis at this time. Inflammatory changes are also noted adjacent to the hepatic flexure of the colon, also likely from the gallbladder. The appendix is not confidently identified and may be surgically absent. Regardless, there are no inflammatory changes noted adjacent to the cecum to suggest the presence of an acute appendicitis at this time. Vascular/Lymphatic: Aortic atherosclerosis. No lymphadenopathy noted in the abdomen or pelvis. Reproductive: Prostate gland and seminal vesicles are unremarkable in appearance. Other: Trace volume of ascites.  No pneumoperitoneum. Musculoskeletal: There are no aggressive appearing lytic or blastic lesions noted in the visualized portions of  the skeleton. IMPRESSION: 1. Findings are highly concerning for an acute cholecystitis. This could be confirmed with right upper quadrant ultrasound. 2. Severe hepatic steatosis. 3. Aortic atherosclerosis. 4. Trace volume of ascites, likely reactive. These results were called by telephone at the time of interpretation on 09/05/2017 at 6:50 pm to Nurse Ray for Dr. Bonnielee Haff, who verbally acknowledged these results. Aortic Atherosclerosis (ICD10-I70.0). Electronically Signed   By: Vinnie Langton M.D.   On: 09/05/2017 18:51   Dg Chest 2 View  Result Date: 09/03/2017 CLINICAL DATA:  Chest pain and shortness of Breath EXAM: CHEST - 2 VIEW COMPARISON:  03/12/2009 FINDINGS: Cardiac shadow is mildly enlarged. Minimal left basilar atelectasis is seen in the lingula. No sizable effusion is noted. No acute bony abnormality is seen. IMPRESSION: Mild left basilar atelectasis. Electronically Signed   By: Inez Catalina M.D.   On: 09/03/2017 11:52   Nm Hepatobiliary Liver Func  Result Date: 09/09/2017 CLINICAL DATA:  57 year old male status post cholecystectomy on 09/06/2017 with persistent right upper quadrant pain. EXAM: NUCLEAR MEDICINE HEPATOBILIARY IMAGING TECHNIQUE: Sequential images of the  abdomen were obtained out to 60 minutes following intravenous administration of radiopharmaceutical. RADIOPHARMACEUTICALS:  5 mCi Tc-5m  Choletec IV COMPARISON:  09/06/2017. FINDINGS: Prompt uptake and biliary excretion of activity by the liver is seen. Activity is visualized in the gallbladder fossa, followed by eventual spillage of activity beneath the right lobe of the liver into the upper right peritoneal cavity. Activity also extends into the small bowel via the common bile duct. IMPRESSION: 1. Study is positive for bile leak, as above. These results will be called to the ordering clinician or representative by the Radiologist Assistant, and communication documented in the PACS or zVision Dashboard. Electronically Signed    By: Vinnie Langton M.D.   On: 09/09/2017 18:13   US Abdomen Complete  Result Date: 09/04/2017 CLINICAL DATA:  Right upper quadrant abdominal pain over the last 2 days. EXAM: ABDOMEN ULTRASOUND COMPLETE COMPARISON:  None. FINDINGS: Gallbladder: No gallstones or wall thickening visualized. No sonographic Murphy sign noted by sonographer. Common bile duct: Diameter: 5 mm common normal Liver: Diffusely echogenic liver suggesting fatty change. No focal lesion or ductal dilatation. Portal vein is patent on color Doppler imaging with normal direction of blood flow towards the liver. IVC: Poorly seen because of overlying bowel gas. Pancreas: Head and body appear normal. Tail not well seen because of overlying bowel gas. Spleen: Size and appearance within normal limits. Right Kidney: Length: 12.0 cm. 1 cm cyst. Echogenicity within normal limits. No mass or hydronephrosis visualized. Left Kidney: Length: 10.9 cm. Echogenicity within normal limits. No mass or hydronephrosis visualized. Abdominal aorta: Maximal diameter 2.6 cm. Partially obscured by gas. Other findings: No ascites IMPRESSION: Diffusely echogenic liver consistent with steatosis. This can be painful. No gallbladder pathology or ductal dilatation is seen. Electronically Signed   By: Nelson Chimes M.D.   On: 09/04/2017 11:24   Ct Abdomen Pelvis W Contrast  Result Date: 09/12/2017 CLINICAL DATA:  Evaluate possible fistula of bile duct. EXAM: CT ABDOMEN AND PELVIS WITH CONTRAST TECHNIQUE: Multidetector CT imaging of the abdomen and pelvis was performed using the standard protocol following bolus administration of intravenous contrast. CONTRAST:  134mL OMNIPAQUE IOHEXOL 300 MG/ML  SOLN COMPARISON:  09/05/2017 FINDINGS: Lower chest: Minimal bibasilar atelectasis and tiny amount right pleural fluid. Few tiny right pericardial phrenic lymph nodes slightly more prominent. Hepatobiliary: Recent cholecystectomy. Tiny 3 mm stone seen over the gallbladder fossa which  may be within the cystic duct. Liver is otherwise within normal. Common bile duct is unremarkable. There is a percutaneous drainage catheter extending from the right abdomen coursing to have tip in the epigastric region. Minimal fluid over the surgical bed of the gallbladder fossa. Pancreas: Normal. Spleen: Normal. Adrenals/Urinary Tract: Adrenal glands are normal. Kidneys are normal in size without hydronephrosis. No significant nephrolithiasis. Ureters and bladder are normal. Stomach/Bowel: Stomach and small bowel are within normal. Appendix is not visualized. Mild diverticulosis of the colon. Vascular/Lymphatic: Within normal. Reproductive: Normal. Other: No significant free peritoneal fluid or focal inflammatory change. Musculoskeletal: Mild degenerate change of the spine. IMPRESSION: Evidence of recent cholecystectomy. Percutaneous drain over the right abdomen with tip in the epigastric region. Small amount of fluid over the surgical bed of the gallbladder fossa with 3 mm stone projecting over the gallbladder fossa likely within the cystic duct. Mild bibasilar atelectasis with small amount right pleural fluid. Mild colonic diverticulosis. Electronically Signed   By: Marin Olp M.D.   On: 09/12/2017 03:00   Nm Hepato W/eject Fract  Result Date: 09/06/2017 CLINICAL DATA:  RIGHT upper quadrant pain. EXAM: NUCLEAR MEDICINE HEPATOBILIARY IMAGING TECHNIQUE: Sequential images of the abdomen were obtained out to 60 minutes following intravenous administration of radiopharmaceutical. RADIOPHARMACEUTICALS:  5.2 mCi Tc-22m  Choletec IV and 3 mg morphine COMPARISON:  CT abdomen dated 09/05/2017. FINDINGS: Prompt uptake and biliary excretion of activity by the liver is seen. Biliary activity passes into small bowel, consistent with patent common bile duct. No gallbladder activity visualized indicating acute cholecystitis. IMPRESSION: Findings are consistent with acute cholecystitis. Electronically Signed   By: Franki Cabot M.D.   On: 09/06/2017 11:44   Nm Myocar Multi W/spect W/wall Motion / Ef  Result Date: 09/04/2017  There was no ST segment deviation noted during stress.  This is an intermediate risk study.  The left ventricular ejection fraction is moderately decreased (30-44%).  1. Fixed medium-sized, moderate intensity mid to apical anteroseptal, mid to apical inferoseptal, and true apex perfusion defect.  Prior infarction involving septum versus LBBB artifact.  No significant ischemia. 2. EF 39% with septal dyskinesis. Intermediate risk study because of low EF.   Dg Ercp Biliary & Pancreatic Ducts  Result Date: 09/11/2017 CLINICAL DATA:  57 year old male status post unsuccessful ERCP EXAM: ERCP TECHNIQUE: Multiple spot images obtained with the fluoroscopic device and submitted for interpretation post-procedure. FLUOROSCOPY TIME:  Fluoroscopy Time:  1 minutes 45 seconds COMPARISON:  None. FINDINGS: A single saved intra procedural images submitted for review. The image demonstrates a flexible endoscope in the descending duodenum with wire cannulation of the distal common bile duct. IMPRESSION: Unsuccessful ERCP. These images were submitted for radiologic interpretation only. Please see the procedural report for the amount of contrast and the fluoroscopy time utilized. Electronically Signed   By: Jacqulynn Cadet M.D.   On: 09/11/2017 15:31    Microbiology: Recent Results (from the past 240 hour(s))  Surgical pcr screen     Status: Abnormal   Collection Time: 09/07/17  2:00 PM  Result Value Ref Range Status   MRSA, PCR NEGATIVE NEGATIVE Final   Staphylococcus aureus POSITIVE (A) NEGATIVE Final    Comment: (NOTE) The Xpert SA Assay (FDA approved for NASAL specimens in patients 81 years of age and older), is one component of a comprehensive surveillance program. It is not intended to diagnose infection nor to guide or monitor treatment. Performed at [facility], [place] [address]  [place], [place] [address]      Labs: Basic Metabolic Panel: Recent Labs  Lab 09/07/17 0408 09/08/17 0442 09/09/17 0453 09/10/17 0357  NA 129* 131* 130* 130*  K 4.0 4.4 4.0 4.2  CL 98* 96* 98* 98*  CO2 23 24 26 23   GLUCOSE 106* 185* 115* 99  BUN 16 19 22* 18  CREATININE 1.61* 1.39* 1.34* 1.14  CALCIUM 8.1* 8.5* 8.0* 8.1*   Liver Function Tests: Recent Labs  Lab 09/07/17 0408 09/08/17 0442 09/09/17 0453 09/10/17 0357  AST 128* 149* 160* 99*  ALT 140* 193* 252* 206*  ALKPHOS 72 84 87 80  BILITOT 1.7* 1.3* 0.8 1.0  PROT 6.6 6.9 6.6 6.3*  ALBUMIN 3.0* 2.8* 2.6* 2.7*   No results for input(s): LIPASE, AMYLASE in the last 168 hours. No results for input(s): AMMONIA in the last 168 hours. CBC: Recent Labs  Lab 09/07/17 0408 09/08/17 0442 09/09/17 0453 09/10/17 0357  WBC 10.8* 9.8 11.8* 9.5  HGB 11.1* 10.9* 10.9* 10.9*  HCT 32.3* 32.2* 32.4* 33.2*  MCV 90.5 91.5 93.1 91.7  PLT 155 150 182 215   Cardiac  Enzymes: No results for input(s): CKTOTAL, CKMB, CKMBINDEX, TROPONINI in the last 168 hours. BNP: BNP (last 3 results) Recent Labs    09/03/17 1115  BNP 7.1    ProBNP (last 3 results) No results for input(s): PROBNP in the last 8760 hours.  CBG: No results for input(s): GLUCAP in the last 168 hours.     Signed:  Phillips Grout MD.  [facility] 09/13/2017, 10:28 AM

## 2017-09-24 ENCOUNTER — Other Ambulatory Visit (HOSPITAL_COMMUNITY): Payer: Self-pay | Admitting: General Surgery

## 2017-09-24 ENCOUNTER — Telehealth: Payer: Self-pay | Admitting: Cardiology

## 2017-09-24 DIAGNOSIS — K9189 Other postprocedural complications and disorders of digestive system: Principal | ICD-10-CM

## 2017-09-24 DIAGNOSIS — K838 Other specified diseases of biliary tract: Secondary | ICD-10-CM

## 2017-09-24 NOTE — Telephone Encounter (Signed)
New Message     1) Are you calling to confirm a diagnosis or obtain personal health information (Y/N)? Y  2) If so, what information is requested? Most recent EF   Please route to Medical Records or your medical records site representative

## 2017-09-25 ENCOUNTER — Encounter (HOSPITAL_COMMUNITY): Payer: Self-pay

## 2017-09-25 ENCOUNTER — Other Ambulatory Visit (HOSPITAL_COMMUNITY): Payer: Self-pay

## 2017-09-25 ENCOUNTER — Ambulatory Visit (HOSPITAL_COMMUNITY)
Admission: RE | Admit: 2017-09-25 | Discharge: 2017-09-25 | Disposition: A | Discharge status: Home / Self Care | Payer: 59 | Source: Ambulatory Visit | Attending: Cardiology | Admitting: Cardiology

## 2017-09-25 VITALS — BP 101/57 | HR 66 | Wt 252.1 lb

## 2017-09-25 DIAGNOSIS — I420 Dilated cardiomyopathy: Secondary | ICD-10-CM

## 2017-09-25 DIAGNOSIS — E785 Hyperlipidemia, unspecified: Secondary | ICD-10-CM | POA: Diagnosis not present

## 2017-09-25 DIAGNOSIS — I447 Left bundle-branch block, unspecified: Secondary | ICD-10-CM | POA: Diagnosis not present

## 2017-09-25 DIAGNOSIS — Z87891 Personal history of nicotine dependence: Secondary | ICD-10-CM | POA: Insufficient documentation

## 2017-09-25 DIAGNOSIS — I110 Hypertensive heart disease with heart failure: Secondary | ICD-10-CM | POA: Insufficient documentation

## 2017-09-25 DIAGNOSIS — G4733 Obstructive sleep apnea (adult) (pediatric): Secondary | ICD-10-CM | POA: Diagnosis not present

## 2017-09-25 DIAGNOSIS — Z79899 Other long term (current) drug therapy: Secondary | ICD-10-CM | POA: Diagnosis not present

## 2017-09-25 DIAGNOSIS — Z6836 Body mass index (BMI) 36.0-36.9, adult: Secondary | ICD-10-CM | POA: Diagnosis not present

## 2017-09-25 DIAGNOSIS — K219 Gastro-esophageal reflux disease without esophagitis: Secondary | ICD-10-CM | POA: Diagnosis not present

## 2017-09-25 DIAGNOSIS — I5022 Chronic systolic (congestive) heart failure: Secondary | ICD-10-CM

## 2017-09-25 DIAGNOSIS — E669 Obesity, unspecified: Secondary | ICD-10-CM | POA: Diagnosis not present

## 2017-09-25 DIAGNOSIS — I429 Cardiomyopathy, unspecified: Secondary | ICD-10-CM | POA: Diagnosis not present

## 2017-09-25 MED ORDER — SACUBITRIL-VALSARTAN 24-26 MG PO TABS: 1.0000 | ORAL_TABLET | Freq: Two times a day (BID) | ORAL | 3 refills | Status: DC

## 2017-09-25 NOTE — Progress Notes (Signed)
Advanced Heart Failure Clinic Note   Referring Physician: Dr. Percival Spanish PCP: [facility] PCP-Cardiologist: Minus Breeding, MD  HF Cardiology: Dr. [name]  HPI:  Nicholas Gray is a 57 y.o. male with chronic systolic CHF, NICM (Normal coronaries 2010), HTN, LBBB, Obesity, and OSA on CPAP.  He has been known to have a cardiomyopathy for the last 10 years or so.   Seen in [place] office 08/13/17 and was overall feeling well. No new symptoms. Had follow up Echo as below 08/27/17 that showed worsening LV dysfunction. Referred by Dr. Percival Spanish to CHF team for further evaluation and treatment of CHF.    Pt admitted 5/30 - 09/13/2017 with Chest/upper abdominal discomfort and pain. CT scan and HIDA consistent with acute cholecystitis. Underwent laproscopic chole on 09/07/17 and found to have gangrenous GB. 09/09/17 repeat HIDA scan showed bile leak. ERCP 09/11/17, but patient had failed stent placement. Considered for IR for drain placement, but decided on conservative management.   Overall feeling OK after recent hospitalization. States his CHF was diagnosed about 10 years ago when routine work up showed LBBB -> Echo showed low EF. He has had intermittent CP but cath in 2010 without CAD as below.  He has mild SOB with activity, but was able to hunt turkeys as recently as this spring. Walks 400-500 yards without difficulty on flat ground. Mild SOB with incline and stairs. He denies orthopnea. Uses his CPAP nightly. He feels like he has gotten slightly worse over the past 10 years, but nothing remarkable. Not working currently.  Echo 08/27/17 LVEF 25-30%, Grade 1 DD, Mild MR, Mild LAE, mild RV dilation, PA peak pressure 34 mm Hg.   Labs (6/19): K 4.2, creatinine 1.14  ECG: NSR, LBBB 166 msec  Social History: Lives in [place], [place]. 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 Denies heavy ETOH use.  Family history: He denies immediate family history of CHF, but thinks  his maternal grandparents may have had "heart problems"   Review of systems complete and found to be negative unless listed in HPI.    Past Medical History: 1. Chronic systolic CHF: Suspected nonischemic cardiomyopathy (normal coronaries 2010).  - Echo 2012 with EF 30% - Echo 12/15 with EF 40-45% - Echo 2017 with EF 35-40% - Echo 08/27/17 LVEF 25-30%, Grade 1 DD, Mild MR, Mild LAE, mild RV dilation, PA peak pressure 34 mm Hg.  2. HTN 3. LBBB, chronic - QRS 166 on EKG 09/04/17 4. Obesity Body mass index is 36.18 kg/m. 5. OSA: Reports compliance with his CPAP.  6. GERD 7. Cholecystitis: Underwent laproscopic chole on 09/07/17 and found to have gangrenous GB.  - 09/09/17 repeat HIDA scan showed bile leak.  - ERCP 09/11/17, but patient had failed stent placement.  Current Outpatient Medications  Medication Sig Dispense Refill  . acetaminophen (TYLENOL) 500 MG tablet Take 2 tablets (1,000 mg total) by mouth every 6 (six) hours as needed for mild pain or headache. 30 tablet 0  . allopurinol (ZYLOPRIM) 100 MG tablet Take 100 mg by mouth 2 (two) times daily.     . carvedilol (COREG) 25 MG tablet TAKE (1) TABLET BY MOUTH TWICE DAILY WITH A MEAL. 60 tablet 11  . furosemide (LASIX) 20 MG tablet Take 20 mg by mouth daily as needed (swelling).    Marland Kitchen lisinopril (PRINIVIL,ZESTRIL) 10 MG tablet TAKE 2 TABLETS BY MOUTH TWICE DAILY. 120 tablet 11  . pantoprazole (PROTONIX) 40 MG tablet  Take 1 tablet (40 mg total) by mouth 2 (two) times daily before a meal for 14 days. 28 tablet 0  . ranitidine (ZANTAC) 150 MG tablet Take 150 mg by mouth daily.    . simvastatin (ZOCOR) 40 MG tablet Take 40 mg by mouth at bedtime.     No current facility-administered medications for this encounter.    No Known Allergies  Vitals:   09/25/17 1451  BP: (!) 101/57  Pulse: 66  SpO2: 100%  Weight: 252 lb 1.9 oz (114.4 kg)   Wt Readings from Last 3 Encounters:  09/25/17 252 lb 1.9 oz (114.4 kg)  09/13/17 246 lb 11.2 oz  (111.9 kg)  08/13/17 279 lb 12.8 oz (126.9 kg)    PHYSICAL EXAM: General:  Well appearing. No respiratory difficulty HEENT: normal Neck: supple. no JVD. Carotids 2+ bilat; no bruits. No lymphadenopathy or thyromegaly appreciated. Cor: PMI nondisplaced. Regular rate & rhythm. No rubs, gallops or murmurs. Lungs: clear Abdomen: Obese, nontender, nondistended. No hepatosplenomegaly. No bruits or masses. Good bowel sounds. Extremities: no cyanosis, clubbing, rash, edema Neuro: alert & oriented x 3, cranial nerves grossly intact. moves all 4 extremities w/o difficulty. Affect pleasant.  ASSESSMENT & PLAN:  1. Chronic systolic CHF, NICM (Normal coronaries 2010) - Echo 08/27/17 LVEF 25-30%, Grade 1 DD, Mild MR, Mild LAE, mild RV dilation, PA peak pressure 34 mm Hg.  - NYHA II - early III symptoms - Volume status stable on exam. - Continue lasix as needed - Stop lisinopril. Start Entresto 24/26 mg BID after 36 hour wash out. (am of 09/27/17) - Consider spironolactone as BP tolerates - Continue coreg 25 mg BID - Consider cMRI.  - Will plan R/LHC to further evaluate given fall in EF.  - If EF remains < 35%, would plan on CRT-D placement given LBBB.   2. HTN - Will adjust meds in setting of treating his CHF. Relatively soft today.   3. LBBB - QRS 166 on EKG 09/04/17  4. Obesity - Body mass index is 36.18 kg/m.  - Encouraged weight loss  5. OSA - Encouraged nightly CPAP.   6. Cholecystitis - Complicated course as above.  - Has drain in place. HIDA scan planned for next week with surgical follow up after.  7. HLD - Continue statin.   Switch to [facility] as above. Plan R/LHC 3 weeks. RTC 4-6 weeks.   Shirley Friar, PA-C 09/25/17   Patient seen with PA, agree with the above note.  He has had a cardiomyopathy for at least the last 10 years, nonischemic based on cath from 2010.  Recent repeat echo showed a fall in EF to the 25-30% range.  He does not have an ICD as prior  echoes have shown EF in the 40% range.  NYHA class II range, fairly mild progression of symptoms over the last few years, still able to get out to [place] hunt and works full time.   ECG with LBBB, QRS 166 msec  On exam, he is not volume overloaded. JVP not elevated, clear lungs, regular rhythm without S3.   We will stop lisinopril and after 36 hour washout start Entresto 24/26 bid. BMET in 10 days.  Next steps will be to titrate up Entresto and add spironolactone.   With fall in EF on echo, I would like to assess his hemodynamics and coronaries as above.  Will arrange for left and right heart catheterization. He understands risks/benefits and agrees to proceed.   After optimization of meds,  will arrange for cardiac MRI to look for infiltrative disease and also to quantify EF.  If EF < 35%, will need CRT-D implantation.   [name] 09/27/2017

## 2017-09-25 NOTE — H&P (View-Only) (Signed)
Advanced Heart Failure Clinic Note   Referring Physician: Dr. Percival Spanish PCP: [place] PCP-Cardiologist: Minus Breeding, MD  HF Cardiology: Dr. [name]  HPI:  Nicholas Gray is a 57 y.o. male with chronic systolic CHF, NICM (Normal coronaries 2010), HTN, LBBB, Obesity, and OSA on CPAP.  He has been known to have a cardiomyopathy for the last 10 years or so.   Seen in [facility] office 08/13/17 and was overall feeling well. No new symptoms. Had follow up Echo as below 08/27/17 that showed worsening LV dysfunction. Referred by Dr. Percival Spanish to CHF team for further evaluation and treatment of CHF.    Pt admitted 5/30 - 09/13/2017 with Chest/upper abdominal discomfort and pain. CT scan and HIDA consistent with acute cholecystitis. Underwent laproscopic chole on 09/07/17 and found to have gangrenous GB. 09/09/17 repeat HIDA scan showed bile leak. ERCP 09/11/17, but patient had failed stent placement. Considered for IR for drain placement, but decided on conservative management.   Overall feeling OK after recent hospitalization. States his CHF was diagnosed about 10 years ago when routine work up showed LBBB -> Echo showed low EF. He has had intermittent CP but cath in 2010 without CAD as below.  He has mild SOB with activity, but was able to hunt turkeys as recently as this spring. Walks 400-500 yards without difficulty on flat ground. Mild SOB with incline and stairs. He denies orthopnea. Uses his CPAP nightly. He feels like he has gotten slightly worse over the past 10 years, but nothing remarkable. Not working currently.  Echo 08/27/17 LVEF 25-30%, Grade 1 DD, Mild MR, Mild LAE, mild RV dilation, PA peak pressure 34 mm Hg.   Labs (6/19): K 4.2, creatinine 1.14  ECG: NSR, LBBB 166 msec  Social History: Lives in [place], [place]. 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 Denies heavy ETOH use.  Family history: He denies immediate family history of CHF, but thinks  his maternal grandparents may have had "heart problems"   Review of systems complete and found to be negative unless listed in HPI.    Past Medical History: 1. Chronic systolic CHF: Suspected nonischemic cardiomyopathy (normal coronaries 2010).  - Echo 2012 with EF 30% - Echo 12/15 with EF 40-45% - Echo 2017 with EF 35-40% - Echo 08/27/17 LVEF 25-30%, Grade 1 DD, Mild MR, Mild LAE, mild RV dilation, PA peak pressure 34 mm Hg.  2. HTN 3. LBBB, chronic - QRS 166 on EKG 09/04/17 4. Obesity Body mass index is 36.18 kg/m. 5. OSA: Reports compliance with his CPAP.  6. GERD 7. Cholecystitis: Underwent laproscopic chole on 09/07/17 and found to have gangrenous GB.  - 09/09/17 repeat HIDA scan showed bile leak.  - ERCP 09/11/17, but patient had failed stent placement.  Current Outpatient Medications  Medication Sig Dispense Refill  . acetaminophen (TYLENOL) 500 MG tablet Take 2 tablets (1,000 mg total) by mouth every 6 (six) hours as needed for mild pain or headache. 30 tablet 0  . allopurinol (ZYLOPRIM) 100 MG tablet Take 100 mg by mouth 2 (two) times daily.     . carvedilol (COREG) 25 MG tablet TAKE (1) TABLET BY MOUTH TWICE DAILY WITH A MEAL. 60 tablet 11  . furosemide (LASIX) 20 MG tablet Take 20 mg by mouth daily as needed (swelling).    Marland Kitchen lisinopril (PRINIVIL,ZESTRIL) 10 MG tablet TAKE 2 TABLETS BY MOUTH TWICE DAILY. 120 tablet 11  . pantoprazole (PROTONIX) 40 MG tablet  Take 1 tablet (40 mg total) by mouth 2 (two) times daily before a meal for 14 days. 28 tablet 0  . ranitidine (ZANTAC) 150 MG tablet Take 150 mg by mouth daily.    . simvastatin (ZOCOR) 40 MG tablet Take 40 mg by mouth at bedtime.     No current facility-administered medications for this encounter.    No Known Allergies  Vitals:   09/25/17 1451  BP: (!) 101/57  Pulse: 66  SpO2: 100%  Weight: 252 lb 1.9 oz (114.4 kg)   Wt Readings from Last 3 Encounters:  09/25/17 252 lb 1.9 oz (114.4 kg)  09/13/17 246 lb 11.2 oz  (111.9 kg)  08/13/17 279 lb 12.8 oz (126.9 kg)    PHYSICAL EXAM: General:  Well appearing. No respiratory difficulty HEENT: normal Neck: supple. no JVD. Carotids 2+ bilat; no bruits. No lymphadenopathy or thyromegaly appreciated. Cor: PMI nondisplaced. Regular rate & rhythm. No rubs, gallops or murmurs. Lungs: clear Abdomen: Obese, nontender, nondistended. No hepatosplenomegaly. No bruits or masses. Good bowel sounds. Extremities: no cyanosis, clubbing, rash, edema Neuro: alert & oriented x 3, cranial nerves grossly intact. moves all 4 extremities w/o difficulty. Affect pleasant.  ASSESSMENT & PLAN:  1. Chronic systolic CHF, NICM (Normal coronaries 2010) - Echo 08/27/17 LVEF 25-30%, Grade 1 DD, Mild MR, Mild LAE, mild RV dilation, PA peak pressure 34 mm Hg.  - NYHA II - early III symptoms - Volume status stable on exam. - Continue lasix as needed - Stop lisinopril. Start Entresto 24/26 mg BID after 36 hour wash out. (am of 09/27/17) - Consider spironolactone as BP tolerates - Continue coreg 25 mg BID - Consider cMRI.  - Will plan R/LHC to further evaluate given fall in EF.  - If EF remains < 35%, would plan on CRT-D placement given LBBB.   2. HTN - Will adjust meds in setting of treating his CHF. Relatively soft today.   3. LBBB - QRS 166 on EKG 09/04/17  4. Obesity - Body mass index is 36.18 kg/m.  - Encouraged weight loss  5. OSA - Encouraged nightly CPAP.   6. Cholecystitis - Complicated course as above.  - Has drain in place. HIDA scan planned for next week with surgical follow up after.  7. HLD - Continue statin.   Switch to [facility] as above. Plan R/LHC 3 weeks. RTC 4-6 weeks.   Shirley Friar, PA-C 09/25/17   Patient seen with PA, agree with the above note.  He has had a cardiomyopathy for at least the last 10 years, nonischemic based on cath from 2010.  Recent repeat echo showed a fall in EF to the 25-30% range.  He does not have an ICD as prior  echoes have shown EF in the 40% range.  NYHA class II range, fairly mild progression of symptoms over the last few years, still able to get out to [place] hunt and works full time.   ECG with LBBB, QRS 166 msec  On exam, he is not volume overloaded. JVP not elevated, clear lungs, regular rhythm without S3.   We will stop lisinopril and after 36 hour washout start Entresto 24/26 bid. BMET in 10 days.  Next steps will be to titrate up Entresto and add spironolactone.   With fall in EF on echo, I would like to assess his hemodynamics and coronaries as above.  Will arrange for left and right heart catheterization. He understands risks/benefits and agrees to proceed.   After optimization of meds,  will arrange for cardiac MRI to look for infiltrative disease and also to quantify EF.  If EF < 35%, will need CRT-D implantation.   [name] 09/27/2017

## 2017-09-25 NOTE — Patient Instructions (Signed)
Stop Lisinopril  Start Entresto 24/26 mg (1 tab), twice a day (09/27/2017)  Your physician recommends that you return for lab work in: 2 weeks   Your physician has requested that you have a cardiac catheterization. Cardiac catheterization is used to diagnose and/or treat various heart conditions. Doctors may recommend this procedure for a number of different reasons. The most common reason is to evaluate chest pain. Chest pain can be a symptom of coronary artery disease (CAD), and cardiac catheterization can show whether plaque is narrowing or blocking your heart's arteries. This procedure is also used to evaluate the valves, as well as measure the blood flow and oxygen levels in different parts of your heart. For further information please visit [email or web address]. Please follow instruction sheet, as given.  Your physician recommends that you schedule a follow-up appointment in: 6 weeks with Dr. [name]

## 2017-09-28 ENCOUNTER — Other Ambulatory Visit (HOSPITAL_COMMUNITY): Payer: Self-pay | Admitting: General Surgery

## 2017-09-28 ENCOUNTER — Encounter (HOSPITAL_COMMUNITY): Payer: Self-pay | Admitting: Cardiology

## 2017-09-28 ENCOUNTER — Telehealth (HOSPITAL_COMMUNITY): Payer: Self-pay | Admitting: *Deleted

## 2017-09-28 ENCOUNTER — Encounter (HOSPITAL_COMMUNITY)
Admission: RE | Admit: 2017-09-28 | Discharge: 2017-09-28 | Disposition: A | Discharge status: Home / Self Care | Payer: 59 | Source: Ambulatory Visit | Attending: General Surgery | Admitting: General Surgery

## 2017-09-28 DIAGNOSIS — K838 Other specified diseases of biliary tract: Secondary | ICD-10-CM | POA: Insufficient documentation

## 2017-09-28 DIAGNOSIS — K9189 Other postprocedural complications and disorders of digestive system: Principal | ICD-10-CM

## 2017-09-28 DIAGNOSIS — K829 Disease of gallbladder, unspecified: Secondary | ICD-10-CM | POA: Diagnosis not present

## 2017-09-28 DIAGNOSIS — K832 Perforation of bile duct: Secondary | ICD-10-CM | POA: Diagnosis not present

## 2017-09-28 MED ORDER — TECHNETIUM TC 99M MEBROFENIN IV KIT
5.0000 | PACK | Freq: Once | INTRAVENOUS | Status: AC | PRN
  Administered 2017-09-28: 5 via INTRAVENOUS

## 2017-09-28 NOTE — Telephone Encounter (Signed)
Have him monitor his blood pressure for a few days. If he has low blood pressure (SBP less than 90) or feels more dizzy, have him call us back. His SBP was in 100s when he saw Dr [name]. We would like to keep him on Entresto if can tolerate.

## 2017-09-28 NOTE — Telephone Encounter (Signed)
Called patient and he is agreeable with plan.  He will call us back if his SBP is lower than 90 OR if he becomes symptomatic (lightheaded/dizzy).  No further questions.

## 2017-09-28 NOTE — Telephone Encounter (Signed)
I called patient directly in regards to his mychart message below.  He stated he started entresto 24-26 mg yesterday morning.  He took it at 5:30 am and then had his PCP check it at 11:00 am and it was 82/48.  Patient stated he felt fine other than a little light headed when standing.  While I was on the phone with him he checked it BP at home and it was 108/63.  I will forward to Lillia Mountain, RN to review and see if we need to adjust any medications or if he should continue as is since his BP is back stable.        Message   ----- Message from [place], Generic sent at 09/28/2017 12:01 PM EDT -----    Had a visit with primary health provider today. My blood pressure was 82 over 48. I was told to let you know.

## 2017-09-29 ENCOUNTER — Telehealth (HOSPITAL_COMMUNITY): Payer: Self-pay | Admitting: *Deleted

## 2017-09-29 ENCOUNTER — Telehealth: Payer: Self-pay | Admitting: Cardiology

## 2017-09-29 NOTE — Telephone Encounter (Signed)
Entresto PA approved through [employer] from 09/29/17 through 09/30/18. PA# [id number].

## 2017-09-29 NOTE — Telephone Encounter (Signed)
New message    Caryl Pina, RN with [facility] is calling to let Dr. Percival Spanish know that pt has been enrolled in heart failure disease management program through them. He will do daily weights and she needs to know about his last ejection fraction.

## 2017-09-29 NOTE — Telephone Encounter (Signed)
Routed to MD as FYI.

## 2017-10-01 ENCOUNTER — Ambulatory Visit: Payer: 59 | Admitting: Cardiology

## 2017-10-01 SURGERY — ERCP, WITH INTERVENTION IF INDICATED
Anesthesia: Monitor Anesthesia Care

## 2017-10-09 ENCOUNTER — Telehealth (HOSPITAL_COMMUNITY): Payer: Self-pay | Admitting: *Deleted

## 2017-10-09 ENCOUNTER — Ambulatory Visit (HOSPITAL_COMMUNITY)
Admission: RE | Admit: 2017-10-09 | Discharge: 2017-10-09 | Disposition: A | Discharge status: Home / Self Care | Payer: 59 | Source: Ambulatory Visit | Attending: Internal Medicine | Admitting: Internal Medicine

## 2017-10-09 ENCOUNTER — Encounter (HOSPITAL_COMMUNITY): Payer: Self-pay

## 2017-10-09 DIAGNOSIS — I5022 Chronic systolic (congestive) heart failure: Secondary | ICD-10-CM | POA: Diagnosis not present

## 2017-10-09 LAB — BASIC METABOLIC PANEL
Anion gap: 9 (ref 5–15)
BUN: 10 mg/dL (ref 6–20)
CALCIUM: 8.1 mg/dL — AB (ref 8.9–10.3)
CHLORIDE: 105 mmol/L (ref 98–111)
CO2: 26 mmol/L (ref 22–32)
CREATININE: 1.08 mg/dL (ref 0.61–1.24)
GFR calc non Af Amer: >60 mL/min (ref >60)
GLUCOSE: 127 mg/dL — AB (ref 70–99)
Potassium: 3.3 mmol/L — ABNORMAL LOW (ref 3.5–5.1)
Sodium: 140 mmol/L (ref 135–145)

## 2017-10-09 MED ORDER — POTASSIUM CHLORIDE CRYS ER 20 MEQ PO TBCR: 20.0000 meq | EXTENDED_RELEASE_TABLET | Freq: Every day | ORAL | 3 refills | Status: DC

## 2017-10-09 NOTE — Telephone Encounter (Signed)
Notes recorded by Scarlette Calico, RN on 10/09/2017 at 4:02 PM EDT Discussed w/Nicholas Tillery PA, he advised pt to start KCL take 40 meq first day then 20 meq daily, recheck lab in 1 week. Spoke w/pt he is aware, agreeable and verbalizes understanding, repeat lab sch for 7/15

## 2017-10-12 ENCOUNTER — Encounter (HOSPITAL_COMMUNITY): Payer: Self-pay | Admitting: Cardiology

## 2017-10-12 ENCOUNTER — Ambulatory Visit (HOSPITAL_COMMUNITY)
Admission: RE | Disposition: A | Discharge status: Home / Self Care | Payer: Self-pay | Source: Ambulatory Visit | Attending: Cardiology

## 2017-10-12 ENCOUNTER — Ambulatory Visit (HOSPITAL_COMMUNITY)
Admission: RE | Admit: 2017-10-12 | Discharge: 2017-10-12 | Disposition: A | Discharge status: Home / Self Care | Payer: 59 | Source: Ambulatory Visit | Attending: Cardiology | Admitting: Cardiology

## 2017-10-12 ENCOUNTER — Other Ambulatory Visit (HOSPITAL_COMMUNITY): Payer: Self-pay

## 2017-10-12 DIAGNOSIS — E785 Hyperlipidemia, unspecified: Secondary | ICD-10-CM | POA: Diagnosis not present

## 2017-10-12 DIAGNOSIS — I428 Other cardiomyopathies: Secondary | ICD-10-CM | POA: Diagnosis not present

## 2017-10-12 DIAGNOSIS — K219 Gastro-esophageal reflux disease without esophagitis: Secondary | ICD-10-CM | POA: Insufficient documentation

## 2017-10-12 DIAGNOSIS — K810 Acute cholecystitis: Secondary | ICD-10-CM | POA: Diagnosis not present

## 2017-10-12 DIAGNOSIS — I110 Hypertensive heart disease with heart failure: Secondary | ICD-10-CM | POA: Insufficient documentation

## 2017-10-12 DIAGNOSIS — Z87891 Personal history of nicotine dependence: Secondary | ICD-10-CM | POA: Insufficient documentation

## 2017-10-12 DIAGNOSIS — I5022 Chronic systolic (congestive) heart failure: Secondary | ICD-10-CM | POA: Diagnosis not present

## 2017-10-12 DIAGNOSIS — Z6836 Body mass index (BMI) 36.0-36.9, adult: Secondary | ICD-10-CM | POA: Diagnosis not present

## 2017-10-12 DIAGNOSIS — I272 Pulmonary hypertension, unspecified: Secondary | ICD-10-CM | POA: Insufficient documentation

## 2017-10-12 DIAGNOSIS — I447 Left bundle-branch block, unspecified: Secondary | ICD-10-CM | POA: Insufficient documentation

## 2017-10-12 DIAGNOSIS — E669 Obesity, unspecified: Secondary | ICD-10-CM | POA: Insufficient documentation

## 2017-10-12 DIAGNOSIS — G4733 Obstructive sleep apnea (adult) (pediatric): Secondary | ICD-10-CM | POA: Diagnosis not present

## 2017-10-12 DIAGNOSIS — I509 Heart failure, unspecified: Secondary | ICD-10-CM | POA: Diagnosis not present

## 2017-10-12 HISTORY — PX: RIGHT/LEFT HEART CATH AND CORONARY ANGIOGRAPHY: CATH118266

## 2017-10-12 LAB — POCT I-STAT 3, VENOUS BLOOD GAS (G3P V)
ACID-BASE DEFICIT: 3 mmol/L — AB (ref 0.0–2.0)
Acid-base deficit: 3 mmol/L — ABNORMAL HIGH (ref 0.0–2.0)
BICARBONATE: 21.8 mmol/L (ref 20.0–28.0)
BICARBONATE: 22.1 mmol/L (ref 20.0–28.0)
O2 SAT: 68 %
O2 Saturation: 71 %
PO2 VEN: 37 mmHg (ref 32.0–45.0)
TCO2: 23 mmol/L (ref 22–32)
TCO2: 23 mmol/L (ref 22–32)
pCO2, Ven: 38.3 mmHg — ABNORMAL LOW (ref 44.0–60.0)
pCO2, Ven: 38.4 mmHg — ABNORMAL LOW (ref 44.0–60.0)
pH, Ven: 7.363 (ref 7.250–7.430)
pH, Ven: 7.367 (ref 7.250–7.430)
pO2, Ven: 39 mmHg (ref 32.0–45.0)

## 2017-10-12 LAB — CBC
HCT: 29.8 % — ABNORMAL LOW (ref 39.0–52.0)
HEMOGLOBIN: 9.7 g/dL — AB (ref 13.0–17.0)
MCH: 30.8 pg (ref 26.0–34.0)
MCHC: 32.6 g/dL (ref 30.0–36.0)
MCV: 94.6 fL (ref 78.0–100.0)
PLATELETS: 177 10*3/uL (ref 150–400)
RBC: 3.15 MIL/uL — ABNORMAL LOW (ref 4.22–5.81)
RDW: 13.2 % (ref 11.5–15.5)
WBC: 5.6 10*3/uL (ref 4.0–10.5)

## 2017-10-12 LAB — BASIC METABOLIC PANEL
Anion gap: 9 (ref 5–15)
BUN: 9 mg/dL (ref 6–20)
CALCIUM: 7.4 mg/dL — AB (ref 8.9–10.3)
CO2: 26 mmol/L (ref 22–32)
Chloride: 105 mmol/L (ref 98–111)
Creatinine, Ser: 1.07 mg/dL (ref 0.61–1.24)
Glucose, Bld: 112 mg/dL — ABNORMAL HIGH (ref 70–99)
Potassium: 3.1 mmol/L — ABNORMAL LOW (ref 3.5–5.1)
SODIUM: 140 mmol/L (ref 135–145)

## 2017-10-12 SURGERY — RIGHT/LEFT HEART CATH AND CORONARY ANGIOGRAPHY
Anesthesia: LOCAL

## 2017-10-12 MED ORDER — ASPIRIN 81 MG PO CHEW
CHEWABLE_TABLET | ORAL | Status: AC
  Administered 2017-10-12: 81 mg via ORAL
  Filled 2017-10-12: qty 1

## 2017-10-12 MED ORDER — VERAPAMIL HCL 2.5 MG/ML IV SOLN
INTRAVENOUS | Status: AC
  Filled 2017-10-12: qty 2

## 2017-10-12 MED ORDER — FENTANYL CITRATE (PF) 100 MCG/2ML IJ SOLN
INTRAMUSCULAR | Status: DC | PRN
  Administered 2017-10-12: 25 ug via INTRAVENOUS

## 2017-10-12 MED ORDER — HEPARIN (PORCINE) IN NACL 2000-0.9 UNIT/L-% IV SOLN
INTRAVENOUS | Status: AC
  Filled 2017-10-12: qty 2000

## 2017-10-12 MED ORDER — HEPARIN SODIUM (PORCINE) 1000 UNIT/ML IJ SOLN
INTRAMUSCULAR | Status: AC
  Filled 2017-10-12: qty 1

## 2017-10-12 MED ORDER — MIDAZOLAM HCL 2 MG/2ML IJ SOLN
INTRAMUSCULAR | Status: DC | PRN
  Administered 2017-10-12: 1 mg via INTRAVENOUS

## 2017-10-12 MED ORDER — VERAPAMIL HCL 2.5 MG/ML IV SOLN
INTRAVENOUS | Status: DC | PRN
  Administered 2017-10-12: 10 mL via INTRA_ARTERIAL

## 2017-10-12 MED ORDER — SODIUM CHLORIDE 0.9% FLUSH: 3.0000 mL | INTRAVENOUS | Status: DC | PRN

## 2017-10-12 MED ORDER — SODIUM CHLORIDE 0.9% FLUSH: 3.0000 mL | Freq: Two times a day (BID) | INTRAVENOUS | Status: DC

## 2017-10-12 MED ORDER — HEPARIN SODIUM (PORCINE) 1000 UNIT/ML IJ SOLN
INTRAMUSCULAR | Status: DC | PRN
  Administered 2017-10-12: 4500 [IU] via INTRAVENOUS

## 2017-10-12 MED ORDER — ONDANSETRON HCL 4 MG/2ML IJ SOLN
4.0000 mg | Freq: Four times a day (QID) | INTRAMUSCULAR | Status: DC | PRN
Start: 2017-10-12 — End: 2017-10-12

## 2017-10-12 MED ORDER — LIDOCAINE HCL (PF) 1 % IJ SOLN
INTRAMUSCULAR | Status: DC | PRN
  Administered 2017-10-12 (×2): 2 mL

## 2017-10-12 MED ORDER — POTASSIUM CHLORIDE 20 MEQ/15ML (10%) PO SOLN
ORAL | Status: AC
  Filled 2017-10-12: qty 30

## 2017-10-12 MED ORDER — LIDOCAINE HCL (PF) 1 % IJ SOLN
INTRAMUSCULAR | Status: AC
  Filled 2017-10-12: qty 30

## 2017-10-12 MED ORDER — SODIUM CHLORIDE 0.9 % IV SOLN
INTRAVENOUS | Status: DC
  Administered 2017-10-12: 06:00:00 via INTRAVENOUS

## 2017-10-12 MED ORDER — SODIUM CHLORIDE 0.9 % IV SOLN: 250.0000 mL | INTRAVENOUS | Status: DC | PRN

## 2017-10-12 MED ORDER — ACETAMINOPHEN 325 MG PO TABS: 650.0000 mg | ORAL_TABLET | ORAL | Status: DC | PRN

## 2017-10-12 MED ORDER — ASPIRIN 81 MG PO CHEW
81.0000 mg | CHEWABLE_TABLET | ORAL | Status: AC
  Administered 2017-10-12: 81 mg via ORAL

## 2017-10-12 MED ORDER — POTASSIUM CHLORIDE CRYS ER 20 MEQ PO TBCR
40.0000 meq | EXTENDED_RELEASE_TABLET | Freq: Every day | ORAL | 3 refills | Status: DC
Start: 2017-10-12 — End: 2017-10-21

## 2017-10-12 MED ORDER — MIDAZOLAM HCL 2 MG/2ML IJ SOLN
INTRAMUSCULAR | Status: AC
  Filled 2017-10-12: qty 2

## 2017-10-12 MED ORDER — IOHEXOL 350 MG/ML SOLN
INTRAVENOUS | Status: DC | PRN
  Administered 2017-10-12: 65 mL via INTRA_ARTERIAL

## 2017-10-12 MED ORDER — SODIUM CHLORIDE 0.9 % IV SOLN: INTRAVENOUS | Status: DC

## 2017-10-12 MED ORDER — FENTANYL CITRATE (PF) 100 MCG/2ML IJ SOLN
INTRAMUSCULAR | Status: AC
  Filled 2017-10-12: qty 2

## 2017-10-12 MED ORDER — POTASSIUM CHLORIDE CRYS ER 20 MEQ PO TBCR
40.0000 meq | EXTENDED_RELEASE_TABLET | Freq: Once | ORAL | Status: AC
  Administered 2017-10-12: 40 meq via ORAL
  Filled 2017-10-12: qty 2

## 2017-10-12 MED ORDER — HEPARIN (PORCINE) IN NACL 2-0.9 UNITS/ML
INTRAMUSCULAR | Status: AC | PRN
  Administered 2017-10-12 (×2): 500 mL

## 2017-10-12 SURGICAL SUPPLY — 11 items

## 2017-10-12 NOTE — Interval H&P Note (Signed)
History and Physical Interval Note:  10/12/2017 7:42 AM  Nicholas Gray  has presented today for surgery, with the diagnosis of hf  The various methods of treatment have been discussed with the patient and family. After consideration of risks, benefits and other options for treatment, the patient has consented to  Procedure(s): RIGHT/LEFT HEART CATH AND CORONARY ANGIOGRAPHY (N/A) as a surgical intervention .  The patient's history has been reviewed, patient examined, no change in status, stable for surgery.  I have reviewed the patient's chart and labs.  Questions were answered to the patient's satisfaction.     D[place] [employer]

## 2017-10-12 NOTE — Discharge Instructions (Signed)
**Note Nicholas Gray-identified via Obfuscation** Radial Site Care °Refer to this sheet in the next few weeks. These instructions provide you with information about caring for yourself after your procedure. Your health care provider may also give you more specific instructions. Your treatment has been planned according to current medical practices, but problems sometimes occur. Call your health care provider if you have any problems or questions after your procedure. °What can I expect after the procedure? °After your procedure, it is typical to have the following: °· Bruising at the radial site that usually fades within 1-2 weeks. °· Blood collecting in the tissue (hematoma) that may be painful to the touch. It should usually decrease in size and tenderness within 1-2 weeks. ° °Follow these instructions at home: °· Take medicines only as directed by your health care provider. °· You may shower 24-48 hours after the procedure or as directed by your health care provider. Remove the bandage (dressing) and gently wash the site with plain soap and water. Pat the area dry with a clean towel. Do not rub the site, because this may cause bleeding. °· Do not take baths, swim, or use a hot tub until your health care provider approves. °· Check your insertion site every day for redness, swelling, or drainage. °· Do not apply powder or lotion to the site. °· Do not flex or bend the affected arm for 24 hours or as directed by your health care provider. °· Do not push or pull heavy objects with the affected arm for 24 hours or as directed by your health care provider. °· Do not lift over 10 lb (4.5 kg) for 5 days after your procedure or as directed by your health care provider. °· Ask your health care provider when it is okay to: °? Return to work or school. °? Resume usual physical activities or sports. °? Resume sexual activity. °· Do not drive home if you are discharged the same day as the procedure. Have someone else drive you. °· You may drive 24 hours after the procedure  unless otherwise instructed by your health care provider. °· Do not operate machinery or power tools for 24 hours after the procedure. °· If your procedure was done as an outpatient procedure, which means that you went home the same day as your procedure, a responsible adult should be with you for the first 24 hours after you arrive home. °· Keep all follow-up visits as directed by your health care provider. This is important. °Contact a health care provider if: °· You have a fever. °· You have chills. °· You have increased bleeding from the radial site. Hold pressure on the site. °Get help right away if: °· You have unusual pain at the radial site. °· You have redness, warmth, or swelling at the radial site. °· You have drainage (other than a small amount of blood on the dressing) from the radial site. °· The radial site is bleeding, and the bleeding does not stop after 30 minutes of holding steady pressure on the site. °· Your arm or hand becomes pale, cool, tingly, or numb. °This information is not intended to replace advice given to you by your health care provider. Make sure you discuss any questions you have with your health care provider. °Document Released: 04/26/2010 Document Revised: 08/30/2015 Document Reviewed: 10/10/2013 °Elsevier Interactive Patient Education © 2018 [employer] ° °

## 2017-10-13 MED FILL — Heparin Sod (Porcine)-NaCl IV Soln 1000 Unit/500ML-0.9%: INTRAVENOUS | Qty: 500 | Status: AC

## 2017-10-19 ENCOUNTER — Ambulatory Visit (HOSPITAL_COMMUNITY)
Admission: RE | Admit: 2017-10-19 | Discharge: 2017-10-19 | Disposition: A | Discharge status: Home / Self Care | Payer: 59 | Source: Ambulatory Visit | Attending: Cardiology | Admitting: Cardiology

## 2017-10-19 DIAGNOSIS — I5022 Chronic systolic (congestive) heart failure: Secondary | ICD-10-CM | POA: Insufficient documentation

## 2017-10-19 LAB — BASIC METABOLIC PANEL
Anion gap: 8 (ref 5–15)
BUN: 11 mg/dL (ref 6–20)
CHLORIDE: 105 mmol/L (ref 98–111)
CO2: 25 mmol/L (ref 22–32)
CREATININE: 1.08 mg/dL (ref 0.61–1.24)
Calcium: 7.4 mg/dL — ABNORMAL LOW (ref 8.9–10.3)
GFR calc Af Amer: >60 mL/min (ref >60)
GLUCOSE: 112 mg/dL — AB (ref 70–99)
Potassium: 3.7 mmol/L (ref 3.5–5.1)
SODIUM: 138 mmol/L (ref 135–145)

## 2017-10-21 ENCOUNTER — Other Ambulatory Visit (HOSPITAL_COMMUNITY): Payer: Self-pay | Admitting: *Deleted

## 2017-10-21 ENCOUNTER — Encounter (HOSPITAL_COMMUNITY): Payer: Self-pay | Admitting: Cardiology

## 2017-10-21 MED ORDER — POTASSIUM CHLORIDE CRYS ER 20 MEQ PO TBCR
40.0000 meq | EXTENDED_RELEASE_TABLET | Freq: Every day | ORAL | 3 refills | Status: DC
Start: 2017-10-21 — End: 2017-11-30

## 2017-10-21 MED ORDER — FUROSEMIDE 20 MG PO TABS: 20.0000 mg | ORAL_TABLET | Freq: Every day | ORAL | 3 refills | Status: DC | PRN

## 2017-10-21 MED ORDER — FUROSEMIDE 20 MG PO TABS: 20.0000 mg | ORAL_TABLET | Freq: Every day | ORAL | 3 refills | Status: DC

## 2017-10-21 NOTE — Telephone Encounter (Signed)
Per Dr.McLean take Lasix 40mg  for two days after that take Lasix 20mg  daily. Bmet in 10days. Lab appt scheduled patient aware and agreeable with plan.

## 2017-11-02 ENCOUNTER — Ambulatory Visit (HOSPITAL_COMMUNITY)
Admission: RE | Admit: 2017-11-02 | Discharge: 2017-11-02 | Disposition: A | Discharge status: Home / Self Care | Payer: 59 | Source: Ambulatory Visit | Attending: Cardiology | Admitting: Cardiology

## 2017-11-02 ENCOUNTER — Other Ambulatory Visit (HOSPITAL_COMMUNITY): Payer: Self-pay | Admitting: *Deleted

## 2017-11-02 DIAGNOSIS — I5022 Chronic systolic (congestive) heart failure: Secondary | ICD-10-CM | POA: Diagnosis not present

## 2017-11-02 LAB — BASIC METABOLIC PANEL
ANION GAP: 15 (ref 5–15)
BUN: 11 mg/dL (ref 6–20)
CALCIUM: 8.3 mg/dL — AB (ref 8.9–10.3)
CO2: 22 mmol/L (ref 22–32)
Chloride: 100 mmol/L (ref 98–111)
Creatinine, Ser: 1.03 mg/dL (ref 0.61–1.24)
GFR calc Af Amer: >60 mL/min (ref >60)
GLUCOSE: 138 mg/dL — AB (ref 70–99)
Potassium: 3.5 mmol/L (ref 3.5–5.1)
Sodium: 137 mmol/L (ref 135–145)

## 2017-11-06 ENCOUNTER — Emergency Department (HOSPITAL_COMMUNITY): Payer: 59

## 2017-11-06 ENCOUNTER — Inpatient Hospital Stay (HOSPITAL_COMMUNITY)
Admission: EM | Admit: 2017-11-06 | Discharge: 2017-11-09 | DRG: 445 | Disposition: A | Discharge status: Home / Self Care | Payer: 59 | Attending: Family Medicine | Admitting: Family Medicine

## 2017-11-06 ENCOUNTER — Other Ambulatory Visit: Payer: Self-pay

## 2017-11-06 ENCOUNTER — Encounter (HOSPITAL_COMMUNITY): Payer: Self-pay | Admitting: Emergency Medicine

## 2017-11-06 DIAGNOSIS — R109 Unspecified abdominal pain: Secondary | ICD-10-CM | POA: Diagnosis not present

## 2017-11-06 DIAGNOSIS — Z86010 Personal history of colonic polyps: Secondary | ICD-10-CM

## 2017-11-06 DIAGNOSIS — I420 Dilated cardiomyopathy: Secondary | ICD-10-CM | POA: Diagnosis present

## 2017-11-06 DIAGNOSIS — M1990 Unspecified osteoarthritis, unspecified site: Secondary | ICD-10-CM | POA: Diagnosis present

## 2017-11-06 DIAGNOSIS — Z6836 Body mass index (BMI) 36.0-36.9, adult: Secondary | ICD-10-CM

## 2017-11-06 DIAGNOSIS — K9189 Other postprocedural complications and disorders of digestive system: Secondary | ICD-10-CM | POA: Diagnosis present

## 2017-11-06 DIAGNOSIS — K219 Gastro-esophageal reflux disease without esophagitis: Secondary | ICD-10-CM | POA: Diagnosis not present

## 2017-11-06 DIAGNOSIS — Z9049 Acquired absence of other specified parts of digestive tract: Secondary | ICD-10-CM

## 2017-11-06 DIAGNOSIS — Z0389 Encounter for observation for other suspected diseases and conditions ruled out: Secondary | ICD-10-CM | POA: Diagnosis not present

## 2017-11-06 DIAGNOSIS — J9811 Atelectasis: Secondary | ICD-10-CM | POA: Diagnosis present

## 2017-11-06 DIAGNOSIS — Z87891 Personal history of nicotine dependence: Secondary | ICD-10-CM

## 2017-11-06 DIAGNOSIS — G4733 Obstructive sleep apnea (adult) (pediatric): Secondary | ICD-10-CM | POA: Diagnosis present

## 2017-11-06 DIAGNOSIS — Y838 Other surgical procedures as the cause of abnormal reaction of the patient, or of later complication, without mention of misadventure at the time of the procedure: Secondary | ICD-10-CM | POA: Diagnosis present

## 2017-11-06 DIAGNOSIS — K5730 Diverticulosis of large intestine without perforation or abscess without bleeding: Secondary | ICD-10-CM | POA: Diagnosis present

## 2017-11-06 DIAGNOSIS — I272 Pulmonary hypertension, unspecified: Secondary | ICD-10-CM | POA: Diagnosis present

## 2017-11-06 DIAGNOSIS — N181 Chronic kidney disease, stage 1: Secondary | ICD-10-CM | POA: Diagnosis present

## 2017-11-06 DIAGNOSIS — K839 Disease of biliary tract, unspecified: Secondary | ICD-10-CM | POA: Diagnosis not present

## 2017-11-06 DIAGNOSIS — I5022 Chronic systolic (congestive) heart failure: Secondary | ICD-10-CM | POA: Diagnosis not present

## 2017-11-06 DIAGNOSIS — I130 Hypertensive heart and chronic kidney disease with heart failure and stage 1 through stage 4 chronic kidney disease, or unspecified chronic kidney disease: Secondary | ICD-10-CM | POA: Diagnosis present

## 2017-11-06 DIAGNOSIS — I447 Left bundle-branch block, unspecified: Secondary | ICD-10-CM | POA: Diagnosis present

## 2017-11-06 DIAGNOSIS — K8050 Calculus of bile duct without cholangitis or cholecystitis without obstruction: Secondary | ICD-10-CM | POA: Diagnosis not present

## 2017-11-06 DIAGNOSIS — K832 Perforation of bile duct: Secondary | ICD-10-CM | POA: Diagnosis not present

## 2017-11-06 DIAGNOSIS — E785 Hyperlipidemia, unspecified: Secondary | ICD-10-CM | POA: Diagnosis present

## 2017-11-06 DIAGNOSIS — M109 Gout, unspecified: Secondary | ICD-10-CM | POA: Diagnosis present

## 2017-11-06 DIAGNOSIS — R1011 Right upper quadrant pain: Secondary | ICD-10-CM | POA: Diagnosis not present

## 2017-11-06 DIAGNOSIS — R079 Chest pain, unspecified: Secondary | ICD-10-CM | POA: Diagnosis not present

## 2017-11-06 LAB — HEPATIC FUNCTION PANEL
ALT: 387 U/L — ABNORMAL HIGH (ref 0–44)
AST: 587 U/L — AB (ref 15–41)
Albumin: 4.4 g/dL (ref 3.5–5.0)
Alkaline Phosphatase: 145 U/L — ABNORMAL HIGH (ref 38–126)
BILIRUBIN DIRECT: 1.6 mg/dL — AB (ref 0.0–0.2)
Indirect Bilirubin: 1.6 mg/dL — ABNORMAL HIGH (ref 0.3–0.9)
Total Bilirubin: 3.2 mg/dL — ABNORMAL HIGH (ref 0.3–1.2)
Total Protein: 7.9 g/dL (ref 6.5–8.1)

## 2017-11-06 LAB — CBC
HCT: 33 % — ABNORMAL LOW (ref 39.0–52.0)
HEMOGLOBIN: 10.7 g/dL — AB (ref 13.0–17.0)
MCH: 30.9 pg (ref 26.0–34.0)
MCHC: 32.4 g/dL (ref 30.0–36.0)
MCV: 95.4 fL (ref 78.0–100.0)
Platelets: 197 10*3/uL (ref 150–400)
RBC: 3.46 MIL/uL — ABNORMAL LOW (ref 4.22–5.81)
RDW: 13.1 % (ref 11.5–15.5)
WBC: 6.1 10*3/uL (ref 4.0–10.5)

## 2017-11-06 LAB — BASIC METABOLIC PANEL
Anion gap: 11 (ref 5–15)
BUN: 13 mg/dL (ref 6–20)
CO2: 24 mmol/L (ref 22–32)
CREATININE: 1.04 mg/dL (ref 0.61–1.24)
Calcium: 8.6 mg/dL — ABNORMAL LOW (ref 8.9–10.3)
Chloride: 102 mmol/L (ref 98–111)
GFR calc Af Amer: >60 mL/min (ref >60)
Glucose, Bld: 134 mg/dL — ABNORMAL HIGH (ref 70–99)
Potassium: 3.7 mmol/L (ref 3.5–5.1)
SODIUM: 137 mmol/L (ref 135–145)

## 2017-11-06 LAB — LIPASE, BLOOD: Lipase: 47 U/L (ref 11–51)

## 2017-11-06 LAB — I-STAT TROPONIN, ED: TROPONIN I, POC: 0 ng/mL (ref 0.00–0.08)

## 2017-11-06 MED ORDER — MORPHINE SULFATE (PF) 4 MG/ML IV SOLN
4.0000 mg | Freq: Once | INTRAVENOUS | Status: AC
  Administered 2017-11-06: 4 mg via INTRAVENOUS
  Filled 2017-11-06: qty 1

## 2017-11-06 MED ORDER — FENTANYL CITRATE (PF) 100 MCG/2ML IJ SOLN
25.0000 ug | INTRAMUSCULAR | Status: DC | PRN
  Administered 2017-11-07 – 2017-11-08 (×7): 50 ug via INTRAVENOUS
  Filled 2017-11-06 (×7): qty 2

## 2017-11-06 MED ORDER — ONDANSETRON HCL 4 MG PO TABS: 4.0000 mg | ORAL_TABLET | Freq: Four times a day (QID) | ORAL | Status: DC | PRN

## 2017-11-06 MED ORDER — ONDANSETRON HCL 4 MG/2ML IJ SOLN: 4.0000 mg | Freq: Four times a day (QID) | INTRAMUSCULAR | Status: DC | PRN

## 2017-11-06 MED ORDER — FAMOTIDINE IN NACL 20-0.9 MG/50ML-% IV SOLN
20.0000 mg | Freq: Two times a day (BID) | INTRAVENOUS | Status: DC
  Administered 2017-11-06 – 2017-11-09 (×6): 20 mg via INTRAVENOUS
  Filled 2017-11-06 (×6): qty 50

## 2017-11-06 MED ORDER — LACTATED RINGERS IV SOLN: INTRAVENOUS | Status: DC

## 2017-11-06 MED ORDER — ACETAMINOPHEN 650 MG RE SUPP: 650.0000 mg | Freq: Four times a day (QID) | RECTAL | Status: DC | PRN

## 2017-11-06 MED ORDER — POTASSIUM CHLORIDE IN NACL 20-0.9 MEQ/L-% IV SOLN
INTRAVENOUS | Status: AC
  Administered 2017-11-06: 20:00:00 via INTRAVENOUS
  Filled 2017-11-06: qty 1000

## 2017-11-06 MED ORDER — ONDANSETRON HCL 4 MG/2ML IJ SOLN
4.0000 mg | Freq: Once | INTRAMUSCULAR | Status: AC
  Administered 2017-11-06: 4 mg via INTRAVENOUS
  Filled 2017-11-06: qty 2

## 2017-11-06 MED ORDER — SODIUM CHLORIDE 0.9 % IV BOLUS
1000.0000 mL | Freq: Once | INTRAVENOUS | Status: AC
  Administered 2017-11-06: 1000 mL via INTRAVENOUS

## 2017-11-06 MED ORDER — PIPERACILLIN-TAZOBACTAM 3.375 G IVPB 30 MIN
3.3750 g | Freq: Once | INTRAVENOUS | Status: AC
  Administered 2017-11-06: 3.375 g via INTRAVENOUS
  Filled 2017-11-06: qty 50

## 2017-11-06 MED ORDER — ACETAMINOPHEN 325 MG PO TABS: 650.0000 mg | ORAL_TABLET | Freq: Four times a day (QID) | ORAL | Status: DC | PRN

## 2017-11-06 MED ORDER — IOHEXOL 300 MG/ML  SOLN
100.0000 mL | Freq: Once | INTRAMUSCULAR | Status: AC | PRN
  Administered 2017-11-06: 100 mL via INTRAVENOUS

## 2017-11-06 NOTE — ED Triage Notes (Signed)
Patient reports epigastric pain that has been constant since 0600. Patient reports squeezing pain. Patient denies any shortness of breath or nausea. Patient reports pain worst with inspiration.  Patient alert and oriented.

## 2017-11-06 NOTE — H&P (Signed)
History and Physical    Nicholas Gray KVQ:[id number] DOB: [birth year] DOA: 11/06/2017  PCP: [facility]   Patient coming from: Home   Chief Complaint: Epigastric pain   HPI: Nicholas Gray is a 57 y.o. male with medical history significant for chronic systolic CHF, OSA, and recent cholecystitis status post laparoscopic cholecystectomy in June, now presenting with acute onset of severe epigastric pain.  Patient was discharged from hospital in June after undergoing laparoscopic cholecystectomy that was complicated by bile leak and failed stent placement.  He had been doing well and in his usual state until early this morning when he developed acute and severe pain in the epigastrium.  Pain has been constant, severe, without alleviating or exacerbating factors, and localized.  He denies any fevers or chills.  Denies any significant nausea and denies vomiting or diarrhea.  Patient also denies chest pain, shortness breath, or cough.  ED Course: Upon arrival to the ED, patient is found to be afebrile, saturating well on room air, and with vitals otherwise stable.  EKG features sinus rhythm with nonspecific IVCD.  Chest x-ray is notable for mild enlargement of the cardiac silhouette and bibasilar atelectasis.  CT of the abdomen and pelvis is notable for a CBD calcification concerning for choledocholithiasis, as well as a small fluid collection in the gallbladder fossa.  Chemistry panel is notable for increased transaminases with AST now 587 and ALT 387.  Total bilirubin is newly elevated to 3.2.  CBC features a normal WBC and stable normocytic anemia.  Troponin is undetectable.  Gastroenterology was consulted by the ED physician for likely ERCP.  The fluid collection was discussed with surgery and no immediate intervention is planned.  Patient was given a liter of normal saline, Zosyn, and morphine in the ED.  He remains hemodynamically stable and will be admitted for ongoing  evaluation and management of choledocholithiasis.  Review of Systems:  All other systems reviewed and apart from HPI, are negative.  Past Medical History:  Diagnosis Date  . Arthritis    gout  . Cardiomyopathy    EF 30-40% (Normal coronaries cath 2010).  Echo 2015 EF 40%  . CHF (congestive heart failure) ([place])   . Colonic polyp   . Diverticulosis of colon   . GERD (gastroesophageal reflux disease)   . History of chicken pox   . HLD (hyperlipidemia)    recent  . HTN (hypertension)    recent  . Sleep apnea    does use CPAP.      Past Surgical History:  Procedure Laterality Date  . CARDIAC CATHETERIZATION  2010  . CHOLECYSTECTOMY N/A 09/07/2017   Procedure: LAPAROSCOPIC CHOLECYSTECTOMY;  Surgeon: Kinsinger, Arta Bruce, MD;  Location: [place];  Service: General;  Laterality: N/A;  LAPAROSCOPIC CHOLECYSTECTOMY  . ERCP N/A 09/11/2017   Procedure: ENDOSCOPIC RETROGRADE CHOLANGIOPANCREATOGRAPHY (ERCP);  Surgeon: Doran Stabler, MD;  Location: [facility];  Service: Gastroenterology;  Laterality: N/A;  . RIGHT/LEFT HEART CATH AND CORONARY ANGIOGRAPHY N/A 10/12/2017   Procedure: RIGHT/LEFT HEART CATH AND CORONARY ANGIOGRAPHY;  Surgeon: Larey Dresser, MD;  Location: [facility];  Service: Cardiovascular;  Laterality: N/A;  . TONSILLECTOMY AND ADENOIDECTOMY       reports that he quit smoking about 37 years ago. He has a 4.50 pack-year smoking history. He has never used smokeless tobacco. He reports that he does not drink alcohol or use drugs.  No Known Allergies  Family History  Problem Relation Age of Onset  .  Arthritis Unknown        family history  . Diabetes Unknown        family history  . Hyperlipidemia Unknown        family history  . Lung cancer Unknown        family history  . Stroke Unknown        family history     Prior to Admission medications   Medication Sig Start Date End Date Taking? Authorizing Provider  allopurinol (ZYLOPRIM) 100 MG tablet Take  100 mg by mouth 2 (two) times daily.    Yes [provider]  carvedilol (COREG) 25 MG tablet TAKE (1) TABLET BY MOUTH TWICE DAILY WITH A MEAL. 01/09/17  Yes Minus Breeding, MD  furosemide (LASIX) 20 MG tablet Take 1 tablet (20 mg total) by mouth daily. 10/21/17  Yes Larey Dresser, MD  potassium chloride SA (K-DUR,KLOR-CON) 20 MEQ tablet Take 2 tablets (40 mEq total) by mouth daily. Patient taking differently: Take 20 mEq by mouth 2 (two) times daily.  10/21/17  Yes Larey Dresser, MD  ranitidine (ZANTAC) 150 MG tablet Take 150 mg by mouth daily.   Yes [provider]  sacubitril-valsartan (ENTRESTO) 24-26 MG Take 1 tablet by mouth 2 (two) times daily. 09/25/17  Yes Larey Dresser, MD  simvastatin (ZOCOR) 40 MG tablet Take 40 mg by mouth at bedtime.   Yes [provider]  pantoprazole (PROTONIX) 40 MG tablet Take 1 tablet (40 mg total) by mouth 2 (two) times daily before a meal for 14 days. Patient not taking: Reported on 11/06/2017 09/05/17 10/12/17  Bonnielee Haff, MD    Physical Exam: Vitals:   11/06/17 1645 11/06/17 1700 11/06/17 1715 11/06/17 1730  BP: 133/88 (!) 144/71 130/61 (!) 142/78  Pulse: 74 78  85  Resp: (!) 28 (!) 28  (!) 33  Temp:      TempSrc:      SpO2: 97% 96%  93%  Weight:      Height:          Constitutional: NAD, appears uncomfortable Eyes: PERTLA, lids and conjunctivae normal ENMT: Mucous membranes are moist. Posterior pharynx clear of any exudate or lesions.   Neck: normal, supple, no masses, no thyromegaly Respiratory: clear to auscultation bilaterally, no wheezing, no crackles. Normal respiratory effort.   Cardiovascular: S1 & S2 heard, regular rate and rhythm. Trace pedal edema bilaterally. No significant JVD. Abdomen: No distension, soft, tender in epigastrium, no rebound pain or gaurding. Bowel sounds active.  Musculoskeletal: no clubbing / cyanosis. No joint deformity upper and lower extremities.   Skin: no significant rashes,  lesions, ulcers. Warm, dry, well-perfused. Neurologic: CN 2-12 grossly intact. Sensation intact. Strength 5/5 in all 4 limbs.  Psychiatric: Alert and oriented x 3. Calm, cooperative.     Labs on Admission: I have personally reviewed following labs and imaging studies  CBC: Recent Labs  Lab 11/06/17 1145  WBC 6.1  HGB 10.7*  HCT 33.0*  MCV 95.4  PLT 161   Basic Metabolic Panel: Recent Labs  Lab 11/02/17 0834 11/06/17 1145  NA 137 137  K 3.5 3.7  CL 100 102  CO2 22 24  GLUCOSE 138* 134*  BUN 11 13  CREATININE 1.03 1.04  CALCIUM 8.3* 8.6*   GFR: Estimated Creatinine Clearance: 98 mL/min (by C-G formula based on SCr of 1.04 mg/dL). Liver Function Tests: Recent Labs  Lab 11/06/17 1659  AST 587*  ALT 387*  ALKPHOS 145*  BILITOT  3.2*  PROT 7.9  ALBUMIN 4.4   Recent Labs  Lab 11/06/17 1659  LIPASE 47   No results for input(s): AMMONIA in the last 168 hours. Coagulation Profile: No results for input(s): INR, PROTIME in the last 168 hours. Cardiac Enzymes: No results for input(s): CKTOTAL, CKMB, CKMBINDEX, TROPONINI in the last 168 hours. BNP (last 3 results) No results for input(s): PROBNP in the last 8760 hours. HbA1C: No results for input(s): HGBA1C in the last 72 hours. CBG: No results for input(s): GLUCAP in the last 168 hours. Lipid Profile: No results for input(s): CHOL, HDL, LDLCALC, TRIG, CHOLHDL, LDLDIRECT in the last 72 hours. Thyroid Function Tests: No results for input(s): TSH, T4TOTAL, FREET4, T3FREE, THYROIDAB in the last 72 hours. Anemia Panel: No results for input(s): VITAMINB12, FOLATE, FERRITIN, TIBC, IRON, RETICCTPCT in the last 72 hours. Urine analysis: No results found for: COLORURINE, APPEARANCEUR, LABSPEC, PHURINE, GLUCOSEU, HGBUR, BILIRUBINUR, KETONESUR, PROTEINUR, UROBILINOGEN, NITRITE, LEUKOCYTESUR Sepsis Labs: @LABRCNTIP (procalcitonin:4,lacticidven:4) )No results found for this or any previous visit (from the past 240 hour(s)).     Radiological Exams on Admission: Dg Chest 2 View  Result Date: 11/06/2017 CLINICAL DATA:  Chest pressure and pain beginning this morning, history CHF, hypertension EXAM: CHEST - 2 VIEW COMPARISON:  09/03/2017 FINDINGS: Enlargement of cardiac silhouette. Mediastinal contours and pulmonary vascularity normal. Bibasilar atelectasis. Lungs otherwise clear. No pulmonary infiltrate, pleural effusion or pneumothorax. Bones unremarkable. IMPRESSION: Mild enlargement of cardiac silhouette with bibasilar atelectasis. Electronically Signed   By: Lavonia Dana M.D.   On: 11/06/2017 12:28   Ct Abdomen Pelvis W Contrast  Result Date: 11/06/2017 CLINICAL DATA:  Acute abdominal pain, RIGHT upper quadrant pain beginning this morning, recent cholecystectomy, history dilated cardiomyopathy, CHF, essential hypertension, former smoker EXAM: CT ABDOMEN AND PELVIS WITH CONTRAST TECHNIQUE: Multidetector CT imaging of the abdomen and pelvis was performed using the standard protocol following bolus administration of intravenous contrast. Sagittal and coronal MPR images reconstructed from axial data set. CONTRAST:  140mL OMNIPAQUE IOHEXOL 300 MG/ML SOLN IV. No oral contrast. COMPARISON:  09/12/2017 FINDINGS: Lower chest: Subsegmental atelectasis RIGHT base Hepatobiliary: Post cholecystectomy. Small fluid collection at gallbladder fossa, could be a sterile or infected postoperative collection measuring 3.2 x 2.0 x 2.6 cm. Liver otherwise normal appearance. No biliary dilatation identified. However, a 3 mm diameter calcification is seen in the distal CBD within the pancreatic head consistent with choledocholithiasis. On the prior study a calcification of similar size with seen within the cystic duct stump. Pancreas: Normal appearance Spleen: Normal appearance Adrenals/Urinary Tract: Adrenal glands, kidneys, ureters, and bladder normal appearance Stomach/Bowel: Nonvisualization of appendix but no pericecal inflammatory process identified.  Stomach and bowel loops otherwise normal appearance. Vascular/Lymphatic: Aorta normal caliber.  No adenopathy. Reproductive: Unremarkable Other: BILATERAL inguinal hernias containing fat. No free air or free fluid. Tiny umbilical hernia containing fat. Musculoskeletal: Unremarkable IMPRESSION: Post cholecystectomy with a small postoperative fluid collection at the gallbladder fossa measuring 3.2 x 2.0 x 2.6 cm in size, could be sterile or infected. 3 mm distal CBD calcification consistent with choledocholithiasis, newly located at this position since the previous exam; no biliary dilatation identified. Electronically Signed   By: Lavonia Dana M.D.   On: 11/06/2017 17:37    EKG: Independently reviewed. Sinus rhythm, non-specific IVCD.   Assessment/Plan   1. Choledocholithiasis   - Presents with acute-onset of epigastric pain this am; is s/p lap cholecystectomy on 04/12/08 complicated by bile leak and failed ERCP stent placement on 6/7  - He is afebrile,  hemodynamically stable, and there is no leukocytosis  - Transaminases have increased with AST now 587 and ALT 387; total bili newly elevated to 3.2; lipase wnl  - CT with small fluid-collection at gallbladder fossa and CBD calcification concerning for choledocholithiasis  - GI is consulting and much appreciated  - Keep NPO, continue supportive care, infection surveillance   2. Fluid collection in gall bladder fossa  - Noted on admission CT  - No infectious s/s, more likely sterile collection  - Surgery was contacted by ED physician; no intervention planned for now given lack of fever or leukocytosis   3. Chronic systolic CHF  - Appears well-compensated, wt stable from recent discharge   - EF 25-30% on TTE in May, no significant CAD on cath in July '19  - He is NPO on admission, following I/O's and daily wts, provided very gentle IVF hydration    4. OSA  - Continue CPAP qHS     DVT prophylaxis: SCD's  Code Status: Full  Family Communication:  Discussed with patient  Consults called: GI, surgery Admission status: Inpatient     Vianne Bulls, MD [facility] Pager [phone]  If 7PM-7AM, please contact night-coverage [email or web address] Password [email or web address]  11/06/2017, 7:20 PM

## 2017-11-06 NOTE — ED Provider Notes (Signed)
[facility] EMERGENCY DEPARTMENT Provider Note   CSN: [id number] Arrival date & time: 11/06/17  1133     History   Chief Complaint Chief Complaint  Patient presents with  . Chest Pain    HPI Nicholas Gray is a 57 y.o. male.  57 yo M with a chief complaint of right upper quadrant abdominal pain.  Feels that this feels similar from when he had his gallbladder removed.  This was done a couple months ago.  Unfortunate complicated with him requiring a percutaneous drain for about a month.  Pain started about 10 hours ago.  Sharp and stabbing.  Goes from the right upper quadrant to the back.  Nothing seems to make this better or worse.  Have not spontaneously resolved.  Having some chills but denies fevers.  Denies nausea or vomiting.  Denies trauma.  The history is provided by the patient.  Abdominal Pain   This is a recurrent problem. The current episode started 6 to 12 hours ago. The problem occurs constantly. The problem has been gradually worsening. The pain is associated with an unknown factor. The pain is located in the RUQ. The quality of the pain is sharp and shooting. The pain is at a severity of 10/10. The pain is severe. Pertinent negatives include fever, diarrhea, vomiting, headaches, arthralgias and myalgias. Nothing aggravates the symptoms. Nothing relieves the symptoms. Past workup includes surgery.    Past Medical History:  Diagnosis Date  . Arthritis    gout  . Cardiomyopathy    EF 30-40% (Normal coronaries cath 2010).  Echo 2015 EF 40%  . CHF (congestive heart failure) ([place])   . Colonic polyp   . Diverticulosis of colon   . GERD (gastroesophageal reflux disease)   . History of chicken pox   . HLD (hyperlipidemia)    recent  . HTN (hypertension)    recent  . Sleep apnea    does use CPAP.      Patient Active Problem List   Diagnosis Date Noted  . Choledocholithiasis 11/06/2017  . Bile leak   . RUQ pain   . Chronic systolic CHF  (congestive heart failure) ([place]) 09/03/2017  . ACTINIC KERATOSIS 11/01/2009  . Dilated cardiomyopathy ([place]) 06/12/2009  . OSA (obstructive sleep apnea) 05/02/2009  . Morbid obesity ([place]) 01/31/2009  . LBBB (left bundle branch block) 12/25/2008  . HYPERLIPIDEMIA 11/22/2008  . COLONIC POLYPS 10/19/2008  . Essential hypertension 10/19/2008  . GERD 10/19/2008  . DIVERTICULITIS, COLON 10/19/2008    Past Surgical History:  Procedure Laterality Date  . CARDIAC CATHETERIZATION  2010  . CHOLECYSTECTOMY N/A 09/07/2017   Procedure: LAPAROSCOPIC CHOLECYSTECTOMY;  Surgeon: Kinsinger, Arta Bruce, MD;  Location: [facility];  Service: General;  Laterality: N/A;  LAPAROSCOPIC CHOLECYSTECTOMY  . ERCP N/A 09/11/2017   Procedure: ENDOSCOPIC RETROGRADE CHOLANGIOPANCREATOGRAPHY (ERCP);  Surgeon: Doran Stabler, MD;  Location: [facility];  Service: Gastroenterology;  Laterality: N/A;  . RIGHT/LEFT HEART CATH AND CORONARY ANGIOGRAPHY N/A 10/12/2017   Procedure: RIGHT/LEFT HEART CATH AND CORONARY ANGIOGRAPHY;  Surgeon: Larey Dresser, MD;  Location: [facility];  Service: Cardiovascular;  Laterality: N/A;  . TONSILLECTOMY AND ADENOIDECTOMY          Home Medications    Prior to Admission medications   Medication Sig Start Date End Date Taking? Authorizing Provider  allopurinol (ZYLOPRIM) 100 MG tablet Take 100 mg by mouth 2 (two) times daily.    Yes [provider]  carvedilol (COREG) 25  MG tablet TAKE (1) TABLET BY MOUTH TWICE DAILY WITH A MEAL. 01/09/17  Yes Minus Breeding, MD  furosemide (LASIX) 20 MG tablet Take 1 tablet (20 mg total) by mouth daily. 10/21/17  Yes Larey Dresser, MD  potassium chloride SA (K-DUR,KLOR-CON) 20 MEQ tablet Take 2 tablets (40 mEq total) by mouth daily. Patient taking differently: Take 20 mEq by mouth 2 (two) times daily.  10/21/17  Yes Larey Dresser, MD  ranitidine (ZANTAC) 150 MG tablet Take 150 mg by mouth daily.   Yes [provider]    sacubitril-valsartan (ENTRESTO) 24-26 MG Take 1 tablet by mouth 2 (two) times daily. 09/25/17  Yes Larey Dresser, MD  simvastatin (ZOCOR) 40 MG tablet Take 40 mg by mouth at bedtime.   Yes [provider]  pantoprazole (PROTONIX) 40 MG tablet Take 1 tablet (40 mg total) by mouth 2 (two) times daily before a meal for 14 days. Patient not taking: Reported on 11/06/2017 09/05/17 10/12/17  Bonnielee Haff, MD    Family History Family History  Problem Relation Age of Onset  . Arthritis Unknown        family history  . Diabetes Unknown        family history  . Hyperlipidemia Unknown        family history  . Lung cancer Unknown        family history  . Stroke Unknown        family history    Social History Social History   Tobacco Use  . Smoking status: Former Smoker    Packs/day: 1.50    Years: 3.00    Pack years: 4.50    Last attempt to quit: 04/07/1980    Years since quitting: 37.6  . Smokeless tobacco: Never Used  Substance Use Topics  . Alcohol use: No  . Drug use: No    Comment: quit in 1999, marijuana     Allergies   Patient has no known allergies.   Review of Systems Review of Systems  Constitutional: Negative for chills and fever.  HENT: Negative for congestion and facial swelling.   Eyes: Negative for discharge and visual disturbance.  Respiratory: Negative for shortness of breath.   Cardiovascular: Negative for chest pain and palpitations.  Gastrointestinal: Positive for abdominal pain. Negative for diarrhea and vomiting.  Musculoskeletal: Negative for arthralgias and myalgias.  Skin: Negative for color change and rash.  Neurological: Negative for tremors, syncope and headaches.  Psychiatric/Behavioral: Negative for confusion and dysphoric mood.     Physical Exam Updated Vital Signs BP 134/63 (BP Location: Right Arm)   Pulse 84   Temp 99.7 F (37.6 C) (Oral)   Resp (!) 22   Ht 5\' 10"  (1.778 m)   Wt 116.2 kg (256 lb 2.8 oz)   SpO2 94%   BMI  36.76 kg/m   Physical Exam  Constitutional: He is oriented to person, place, and time. He appears well-developed and well-nourished.  HENT:  Head: Normocephalic and atraumatic.  Eyes: Pupils are equal, round, and reactive to light. EOM are normal.  Neck: Normal range of motion. Neck supple. No JVD present.  Cardiovascular: Normal rate and regular rhythm. Exam reveals no gallop and no friction rub.  No murmur heard. Pulmonary/Chest: No respiratory distress. He has no wheezes.  Abdominal: He exhibits no distension and no mass. There is tenderness (worst to the RUQ, negative murphys). There is no rebound and no guarding.  Musculoskeletal: Normal range of motion.  Neurological: He is  alert and oriented to person, place, and time.  Skin: No rash noted. No pallor.  Psychiatric: He has a normal mood and affect. His behavior is normal.  Nursing note and vitals reviewed.    ED Treatments / Results  Labs (all labs ordered are listed, but only abnormal results are displayed) Labs Reviewed  BASIC METABOLIC PANEL - Abnormal; Notable for the following components:      Result Value   Glucose, Bld 134 (*)    Calcium 8.6 (*)    All other components within normal limits  CBC - Abnormal; Notable for the following components:   RBC 3.46 (*)    Hemoglobin 10.7 (*)    HCT 33.0 (*)    All other components within normal limits  HEPATIC FUNCTION PANEL - Abnormal; Notable for the following components:   AST 587 (*)    ALT 387 (*)    Alkaline Phosphatase 145 (*)    Total Bilirubin 3.2 (*)    Bilirubin, Direct 1.6 (*)    Indirect Bilirubin 1.6 (*)    All other components within normal limits  LIPASE, BLOOD  COMPREHENSIVE METABOLIC PANEL  CBC WITH DIFFERENTIAL/PLATELET  LIPASE, BLOOD  I-STAT TROPONIN, ED    EKG EKG Interpretation  Date/Time:  Friday November 06 2017 11:40:07 EDT Ventricular Rate:  67 PR Interval:  192 QRS Duration: 154 QT Interval:  452 QTC Calculation: 477 R  Axis:   75 Text Interpretation:  Normal sinus rhythm Non-specific intra-ventricular conduction block Abnormal ECG morphology change from prior, slight widening of the qrs, likely LBBB Confirmed by Deno Etienne [phone]) on 11/06/2017 3:41:41 PM   Radiology Dg Chest 2 View  Result Date: 11/06/2017 CLINICAL DATA:  Chest pressure and pain beginning this morning, history CHF, hypertension EXAM: CHEST - 2 VIEW COMPARISON:  09/03/2017 FINDINGS: Enlargement of cardiac silhouette. Mediastinal contours and pulmonary vascularity normal. Bibasilar atelectasis. Lungs otherwise clear. No pulmonary infiltrate, pleural effusion or pneumothorax. Bones unremarkable. IMPRESSION: Mild enlargement of cardiac silhouette with bibasilar atelectasis. Electronically Signed   By: Lavonia Dana M.D.   On: 11/06/2017 12:28   Ct Abdomen Pelvis W Contrast  Result Date: 11/06/2017 CLINICAL DATA:  Acute abdominal pain, RIGHT upper quadrant pain beginning this morning, recent cholecystectomy, history dilated cardiomyopathy, CHF, essential hypertension, former smoker EXAM: CT ABDOMEN AND PELVIS WITH CONTRAST TECHNIQUE: Multidetector CT imaging of the abdomen and pelvis was performed using the standard protocol following bolus administration of intravenous contrast. Sagittal and coronal MPR images reconstructed from axial data set. CONTRAST:  153mL OMNIPAQUE IOHEXOL 300 MG/ML SOLN IV. No oral contrast. COMPARISON:  09/12/2017 FINDINGS: Lower chest: Subsegmental atelectasis RIGHT base Hepatobiliary: Post cholecystectomy. Small fluid collection at gallbladder fossa, could be a sterile or infected postoperative collection measuring 3.2 x 2.0 x 2.6 cm. Liver otherwise normal appearance. No biliary dilatation identified. However, a 3 mm diameter calcification is seen in the distal CBD within the pancreatic head consistent with choledocholithiasis. On the prior study a calcification of similar size with seen within the cystic duct stump. Pancreas: Normal  appearance Spleen: Normal appearance Adrenals/Urinary Tract: Adrenal glands, kidneys, ureters, and bladder normal appearance Stomach/Bowel: Nonvisualization of appendix but no pericecal inflammatory process identified. Stomach and bowel loops otherwise normal appearance. Vascular/Lymphatic: Aorta normal caliber.  No adenopathy. Reproductive: Unremarkable Other: BILATERAL inguinal hernias containing fat. No free air or free fluid. Tiny umbilical hernia containing fat. Musculoskeletal: Unremarkable IMPRESSION: Post cholecystectomy with a small postoperative fluid collection at the gallbladder fossa measuring 3.2 x 2.0 x 2.6  cm in size, could be sterile or infected. 3 mm distal CBD calcification consistent with choledocholithiasis, newly located at this position since the previous exam; no biliary dilatation identified. Electronically Signed   By: Lavonia Dana M.D.   On: 11/06/2017 17:37    Procedures Procedures (including critical care time)  Medications Ordered in ED Medications  famotidine (PEPCID) IVPB 20 mg premix ( Intravenous Rate/Dose Verify 11/06/17 2219)  0.9 % NaCl with KCl 20 mEq/ L  infusion ( Intravenous Stopped 11/06/17 2154)  acetaminophen (TYLENOL) tablet 650 mg (has no administration in time range)    Or  acetaminophen (TYLENOL) suppository 650 mg (has no administration in time range)  fentaNYL (SUBLIMAZE) injection 25-50 mcg (has no administration in time range)  ondansetron (ZOFRAN) tablet 4 mg (has no administration in time range)    Or  ondansetron (ZOFRAN) injection 4 mg (has no administration in time range)  morphine 4 MG/ML injection 4 mg (4 mg Intravenous Given 11/06/17 1656)  ondansetron (ZOFRAN) injection 4 mg (4 mg Intravenous Given 11/06/17 1656)  sodium chloride 0.9 % bolus 1,000 mL (0 mLs Intravenous Stopped 11/06/17 1841)  iohexol (OMNIPAQUE) 300 MG/ML solution 100 mL (100 mLs Intravenous Contrast Given 11/06/17 1713)  piperacillin-tazobactam (ZOSYN) IVPB 3.375 g (0 g  Intravenous Stopped 11/06/17 1916)     Initial Impression / Assessment and Plan / ED Course  I have reviewed the triage vital signs and the nursing notes.  Pertinent labs & imaging results that were available during my care of the patient were reviewed by me and considered in my medical decision making (see chart for details).     57 yo M with a chief complaint of right upper quadrant abdominal pain.  Patient has gallbladder taken out about 2 months ago he had a percutaneous drain for about a month.  Since then has been doing okay but started having fairly persistent pain going on for the past 10 hours or so.  Sharp and stabbing.  Subjective chills.  Chest pain work-up ordered in triage.  Will add on LFTs and lipase.  If the patient pain and nausea medicine.  This being postop we will order a CT to further evaluate.  CT scan with a fluid collection in the gallbladder fossa, also has a new common bile duct stone.  LFTs are elevated as well.  I discussed case with Dr. Havery Moros, gastroenterology he feels that the patient likely needs an ERCP here.  They will see the patient in the morning and reassess the actual timing but he recommends the patient be n.p.o.  I discussed the case with Dr. Redmond Pulling, general surgery at this point with the patient being afebrile and no leukocytosis would hold off on percutaneous drain placement.  Feels that ERCP is likely the next study of choice.  CRITICAL CARE Performed by: Cecilio Asper   Total critical care time: 35 minutes  Critical care time was exclusive of separately billable procedures and treating other patients.  Critical care was necessary to treat or prevent imminent or life-threatening deterioration.  Critical care was time spent personally by me on the following activities: development of treatment plan with patient and/or surrogate as well as nursing, discussions with consultants, evaluation of patient's response to treatment, examination  of patient, obtaining history from patient or surrogate, ordering and performing treatments and interventions, ordering and review of laboratory studies, ordering and review of radiographic studies, pulse oximetry and re-evaluation of patient's condition.  The patients results and plan were reviewed and  discussed.   Any x-rays performed were independently reviewed by myself.   Differential diagnosis were considered with the presenting HPI.  Medications  famotidine (PEPCID) IVPB 20 mg premix ( Intravenous Rate/Dose Verify 11/06/17 2219)  0.9 % NaCl with KCl 20 mEq/ L  infusion ( Intravenous Stopped 11/06/17 2154)  acetaminophen (TYLENOL) tablet 650 mg (has no administration in time range)    Or  acetaminophen (TYLENOL) suppository 650 mg (has no administration in time range)  fentaNYL (SUBLIMAZE) injection 25-50 mcg (has no administration in time range)  ondansetron (ZOFRAN) tablet 4 mg (has no administration in time range)    Or  ondansetron (ZOFRAN) injection 4 mg (has no administration in time range)  morphine 4 MG/ML injection 4 mg (4 mg Intravenous Given 11/06/17 1656)  ondansetron (ZOFRAN) injection 4 mg (4 mg Intravenous Given 11/06/17 1656)  sodium chloride 0.9 % bolus 1,000 mL (0 mLs Intravenous Stopped 11/06/17 1841)  iohexol (OMNIPAQUE) 300 MG/ML solution 100 mL (100 mLs Intravenous Contrast Given 11/06/17 1713)  piperacillin-tazobactam (ZOSYN) IVPB 3.375 g (0 g Intravenous Stopped 11/06/17 1916)    Vitals:   11/06/17 1700 11/06/17 1715 11/06/17 1730 11/06/17 2015  BP: (!) 144/71 130/61 (!) 142/78 134/63  Pulse: 78  85 84  Resp: (!) 28  (!) 33 (!) 22  Temp:    99.7 F (37.6 C)  TempSrc:    Oral  SpO2: 96%  93% 94%  Weight:    116.2 kg (256 lb 2.8 oz)  Height:    5\' 10"  (1.778 m)    Final diagnoses:  Choledocholithiasis    Admission/ observation were discussed with the admitting physician, patient and/or family and they are comfortable with the plan.   Final Clinical  Impressions(s) / ED Diagnoses   Final diagnoses:  Choledocholithiasis    ED Discharge Orders    None       Deno Etienne, DO 11/06/17 2343

## 2017-11-06 NOTE — ED Notes (Signed)
Patient transported to CT 

## 2017-11-07 DIAGNOSIS — I5022 Chronic systolic (congestive) heart failure: Secondary | ICD-10-CM

## 2017-11-07 DIAGNOSIS — K8050 Calculus of bile duct without cholangitis or cholecystitis without obstruction: Principal | ICD-10-CM

## 2017-11-07 LAB — LIPASE, BLOOD: Lipase: 33 U/L (ref 11–51)

## 2017-11-07 LAB — COMPREHENSIVE METABOLIC PANEL
ALBUMIN: 3.2 g/dL — AB (ref 3.5–5.0)
ALK PHOS: 124 U/L (ref 38–126)
ALT: 386 U/L — AB (ref 0–44)
AST: 336 U/L — AB (ref 15–41)
Anion gap: 9 (ref 5–15)
BUN: 8 mg/dL (ref 6–20)
CALCIUM: 7.8 mg/dL — AB (ref 8.9–10.3)
CO2: 24 mmol/L (ref 22–32)
CREATININE: 1.08 mg/dL (ref 0.61–1.24)
Chloride: 103 mmol/L (ref 98–111)
GFR calc non Af Amer: >60 mL/min (ref >60)
GLUCOSE: 124 mg/dL — AB (ref 70–99)
Potassium: 3.5 mmol/L (ref 3.5–5.1)
Sodium: 136 mmol/L (ref 135–145)
Total Bilirubin: 4.9 mg/dL — ABNORMAL HIGH (ref 0.3–1.2)
Total Protein: 6.2 g/dL — ABNORMAL LOW (ref 6.5–8.1)

## 2017-11-07 LAB — CBC WITH DIFFERENTIAL/PLATELET
Abs Immature Granulocytes: 0 10*3/uL (ref 0.0–0.1)
BASOS PCT: 0 %
Basophils Absolute: 0 10*3/uL (ref 0.0–0.1)
EOS ABS: 0.1 10*3/uL (ref 0.0–0.7)
EOS PCT: 1 %
HEMATOCRIT: 34 % — AB (ref 39.0–52.0)
Hemoglobin: 11.2 g/dL — ABNORMAL LOW (ref 13.0–17.0)
Immature Granulocytes: 1 %
Lymphocytes Relative: 4 %
Lymphs Abs: 0.3 10*3/uL — ABNORMAL LOW (ref 0.7–4.0)
MCH: 30.8 pg (ref 26.0–34.0)
MCHC: 32.9 g/dL (ref 30.0–36.0)
MCV: 93.4 fL (ref 78.0–100.0)
MONO ABS: 0.5 10*3/uL (ref 0.1–1.0)
MONOS PCT: 6 %
Neutro Abs: 7.3 10*3/uL (ref 1.7–7.7)
Neutrophils Relative %: 88 %
PLATELETS: 176 10*3/uL (ref 150–400)
RBC: 3.64 MIL/uL — ABNORMAL LOW (ref 4.22–5.81)
RDW: 12.8 % (ref 11.5–15.5)
WBC: 8.3 10*3/uL (ref 4.0–10.5)

## 2017-11-07 LAB — MAGNESIUM: Magnesium: 1.3 mg/dL — ABNORMAL LOW (ref 1.7–2.4)

## 2017-11-07 MED ORDER — CARVEDILOL 25 MG PO TABS
25.0000 mg | ORAL_TABLET | Freq: Two times a day (BID) | ORAL | Status: DC
  Administered 2017-11-07 – 2017-11-09 (×5): 25 mg via ORAL
  Filled 2017-11-07 (×5): qty 1

## 2017-11-07 MED ORDER — POTASSIUM CHLORIDE IN NACL 20-0.9 MEQ/L-% IV SOLN: INTRAVENOUS | Status: AC

## 2017-11-07 MED ORDER — POTASSIUM CHLORIDE IN NACL 20-0.9 MEQ/L-% IV SOLN
INTRAVENOUS | Status: AC
  Administered 2017-11-07: 12:00:00 via INTRAVENOUS
  Filled 2017-11-07: qty 1000

## 2017-11-07 MED ORDER — CARVEDILOL 25 MG PO TABS: 25.0000 mg | ORAL_TABLET | Freq: Two times a day (BID) | ORAL | Status: DC

## 2017-11-07 MED ORDER — MAGNESIUM SULFATE 2 GM/50ML IV SOLN
2.0000 g | Freq: Once | INTRAVENOUS | Status: AC
  Administered 2017-11-07: 2 g via INTRAVENOUS
  Filled 2017-11-07: qty 50

## 2017-11-07 NOTE — Plan of Care (Signed)
  Problem: Activity: Goal: Risk for activity intolerance will decrease Outcome: Progressing   Problem: Elimination: Goal: Will not experience complications related to bowel motility Outcome: Progressing Goal: Will not experience complications related to urinary retention Outcome: Progressing   Problem: Safety: Goal: Ability to remain free from injury will improve Outcome: Progressing   Problem: Skin Integrity: Goal: Risk for impaired skin integrity will decrease Outcome: Progressing   

## 2017-11-07 NOTE — Consult Note (Addendum)
Consultation  Referring Provider:     Mitzi Hansen Primary Care Physician:  [facility] Primary Gastroenterologist:       Wilfrid Lund  Reason for Consultation:     choledocholithiasis         HPI:   Nicholas Gray is a 57 y.o. male with a history of cholecystitis s/p lap chole on June 3rd, who developed a bile leak post-operatively s/p failed ERCP attempt and managed with drain placement, readmitted with choledocholithiasis.  Patient had ERCP by DR. Danis on 09/11/2017 - it was a difficult cannulation and biliary tree was not able to be stented. He was managed conservatively and had a drain in place for a period of time and was feeling better.   Unfortunately he developed RUQ to epigastric pain yesterday. CT abdomen and pelvis shows a 54mm distal CBD stone c/w  choledocholithiasis, as well as a small fluid collection in the gallbladder fossa.  He's had an elevation of bilirubin and ALT. Pain was severe last night but improved with pain medication today. No fevers. He has been empirically treated with Zosyn.  He does have a history of CHF, EF 25-30%, and OSA on CPAP. Other than mild discomfort he denies any other new complaints at present time. States he feels much better today.   Past Medical History:  Diagnosis Date  . Arthritis    gout  . Cardiomyopathy    EF 30-40% (Normal coronaries cath 2010).  Echo 2015 EF 40%  . CHF (congestive heart failure) ([place])   . Colonic polyp   . Diverticulosis of colon   . GERD (gastroesophageal reflux disease)   . History of chicken pox   . HLD (hyperlipidemia)    recent  . HTN (hypertension)    recent  . Sleep apnea    does use CPAP.      Past Surgical History:  Procedure Laterality Date  . CARDIAC CATHETERIZATION  2010  . CHOLECYSTECTOMY N/A 09/07/2017   Procedure: LAPAROSCOPIC CHOLECYSTECTOMY;  Surgeon: Kinsinger, Arta Bruce, MD;  Location: [place];  Service: General;  Laterality: N/A;  LAPAROSCOPIC CHOLECYSTECTOMY    . ERCP N/A 09/11/2017   Procedure: ENDOSCOPIC RETROGRADE CHOLANGIOPANCREATOGRAPHY (ERCP);  Surgeon: Doran Stabler, MD;  Location: [place];  Service: Gastroenterology;  Laterality: N/A;  . RIGHT/LEFT HEART CATH AND CORONARY ANGIOGRAPHY N/A 10/12/2017   Procedure: RIGHT/LEFT HEART CATH AND CORONARY ANGIOGRAPHY;  Surgeon: Larey Dresser, MD;  Location: [place] [facility];  Service: Cardiovascular;  Laterality: N/A;  . TONSILLECTOMY AND ADENOIDECTOMY      Family History  Problem Relation Age of Onset  . Arthritis Unknown        family history  . Diabetes Unknown        family history  . Hyperlipidemia Unknown        family history  . Lung cancer Unknown        family history  . Stroke Unknown        family history     Social History   Tobacco Use  . Smoking status: Former Smoker    Packs/day: 1.50    Years: 3.00    Pack years: 4.50    Last attempt to quit: 04/07/1980    Years since quitting: 37.6  . Smokeless tobacco: Never Used  Substance Use Topics  . Alcohol use: No  . Drug use: No    Comment: quit in 1999, marijuana    Prior to Admission medications  Medication Sig Start Date End Date Taking? Authorizing Provider  allopurinol (ZYLOPRIM) 100 MG tablet Take 100 mg by mouth 2 (two) times daily.    Yes [provider]  carvedilol (COREG) 25 MG tablet TAKE (1) TABLET BY MOUTH TWICE DAILY WITH A MEAL. 01/09/17  Yes Minus Breeding, MD  furosemide (LASIX) 20 MG tablet Take 1 tablet (20 mg total) by mouth daily. 10/21/17  Yes Larey Dresser, MD  potassium chloride SA (K-DUR,KLOR-CON) 20 MEQ tablet Take 2 tablets (40 mEq total) by mouth daily. Patient taking differently: Take 20 mEq by mouth 2 (two) times daily.  10/21/17  Yes Larey Dresser, MD  ranitidine (ZANTAC) 150 MG tablet Take 150 mg by mouth daily.   Yes [provider]  sacubitril-valsartan (ENTRESTO) 24-26 MG Take 1 tablet by mouth 2 (two) times daily. 09/25/17  Yes Larey Dresser, MD   simvastatin (ZOCOR) 40 MG tablet Take 40 mg by mouth at bedtime.   Yes [provider]  pantoprazole (PROTONIX) 40 MG tablet Take 1 tablet (40 mg total) by mouth 2 (two) times daily before a meal for 14 days. Patient not taking: Reported on 11/06/2017 09/05/17 10/12/17  Bonnielee Haff, MD    Current Facility-Administered Medications  Medication Dose Route Frequency Provider Last Rate Last Dose  . 0.9 % NaCl with KCl 20 mEq/ L  infusion   Intravenous Continuous Nita Sells, MD 50 mL/hr at 11/07/17 0816    . acetaminophen (TYLENOL) tablet 650 mg  650 mg Oral Q6H PRN Opyd, Ilene Qua, MD       Or  . acetaminophen (TYLENOL) suppository 650 mg  650 mg Rectal Q6H PRN Opyd, Ilene Qua, MD      . carvedilol (COREG) tablet 25 mg  25 mg Oral BID WC Nita Sells, MD   25 mg at 11/07/17 1024  . famotidine (PEPCID) IVPB 20 mg premix  20 mg Intravenous Q12H Opyd, Ilene Qua, MD 100 mL/hr at 11/07/17 1025 20 mg at 11/07/17 1025  . fentaNYL (SUBLIMAZE) injection 25-50 mcg  25-50 mcg Intravenous Q2H PRN Opyd, Ilene Qua, MD   50 mcg at 11/07/17 0351  . ondansetron (ZOFRAN) tablet 4 mg  4 mg Oral Q6H PRN Opyd, Ilene Qua, MD       Or  . ondansetron (ZOFRAN) injection 4 mg  4 mg Intravenous Q6H PRN Opyd, Ilene Qua, MD        Allergies as of 11/06/2017  . (No Known Allergies)     Review of Systems:    As per HPI, otherwise negative    Physical Exam:  Vital signs in last 24 hours: Temp:  [97.7 F (36.5 C)-99.9 F (37.7 C)] 99.9 F (37.7 C) (08/03 0511) Pulse Rate:  [65-89] 71 (08/03 0511) Resp:  [16-33] 20 (08/03 0511) BP: (130-150)/(61-88) 131/65 (08/03 0511) SpO2:  [93 %-100 %] 96 % (08/03 0511) Weight:  [252 lb (114.3 kg)-256 lb 2.8 oz (116.2 kg)] 256 lb 2.8 oz (116.2 kg) (08/02 2015) Last BM Date: 11/07/17 General:   Pleasant male in NAD Head:  Normocephalic and atraumatic. Eyes:   scleral icterus.   Conjunctiva pink. Ears:  Normal auditory acuity. Neck:  Supple Lungs:   Respirations even and unlabored. Lungs clear to auscultation bilaterally.    Heart:  Regular rate and rhythm; no MRG Abdomen:  Soft, nondistended, mild tenderness in epigastric and RUQ area. . No appreciable masses or hepatomegaly.  Rectal:  Not performed.  Msk:  Symmetrical without gross deformities.  Extremities:  Without edema. Neurologic:  Alert and  oriented x4;  grossly normal neurologically. Skin:  Intact without significant lesions or rashes. Psych:  Alert and cooperative. Normal affect.  LAB RESULTS: Recent Labs    11/06/17 1145 11/07/17 0445  WBC 6.1 8.3  HGB 10.7* 11.2*  HCT 33.0* 34.0*  PLT 197 176   BMET Recent Labs    11/06/17 1145 11/07/17 0445  NA 137 136  K 3.7 3.5  CL 102 103  CO2 24 24  GLUCOSE 134* 124*  BUN 13 8  CREATININE 1.04 1.08  CALCIUM 8.6* 7.8*   LFT Recent Labs    11/06/17 1659 11/07/17 0445  PROT 7.9 6.2*  ALBUMIN 4.4 3.2*  AST 587* 336*  ALT 387* 386*  ALKPHOS 145* 124  BILITOT 3.2* 4.9*  BILIDIR 1.6*  --   IBILI 1.6*  --    PT/INR No results for input(s): LABPROT, INR in the last 72 hours.  STUDIES: Dg Chest 2 View  Result Date: 11/06/2017 CLINICAL DATA:  Chest pressure and pain beginning this morning, history CHF, hypertension EXAM: CHEST - 2 VIEW COMPARISON:  09/03/2017 FINDINGS: Enlargement of cardiac silhouette. Mediastinal contours and pulmonary vascularity normal. Bibasilar atelectasis. Lungs otherwise clear. No pulmonary infiltrate, pleural effusion or pneumothorax. Bones unremarkable. IMPRESSION: Mild enlargement of cardiac silhouette with bibasilar atelectasis. Electronically Signed   By: Lavonia Dana M.D.   On: 11/06/2017 12:28   Ct Abdomen Pelvis W Contrast  Result Date: 11/06/2017 CLINICAL DATA:  Acute abdominal pain, RIGHT upper quadrant pain beginning this morning, recent cholecystectomy, history dilated cardiomyopathy, CHF, essential hypertension, former smoker EXAM: CT ABDOMEN AND PELVIS WITH CONTRAST TECHNIQUE:  Multidetector CT imaging of the abdomen and pelvis was performed using the standard protocol following bolus administration of intravenous contrast. Sagittal and coronal MPR images reconstructed from axial data set. CONTRAST:  1105mL OMNIPAQUE IOHEXOL 300 MG/ML SOLN IV. No oral contrast. COMPARISON:  09/12/2017 FINDINGS: Lower chest: Subsegmental atelectasis RIGHT base Hepatobiliary: Post cholecystectomy. Small fluid collection at gallbladder fossa, could be a sterile or infected postoperative collection measuring 3.2 x 2.0 x 2.6 cm. Liver otherwise normal appearance. No biliary dilatation identified. However, a 3 mm diameter calcification is seen in the distal CBD within the pancreatic head consistent with choledocholithiasis. On the prior study a calcification of similar size with seen within the cystic duct stump. Pancreas: Normal appearance Spleen: Normal appearance Adrenals/Urinary Tract: Adrenal glands, kidneys, ureters, and bladder normal appearance Stomach/Bowel: Nonvisualization of appendix but no pericecal inflammatory process identified. Stomach and bowel loops otherwise normal appearance. Vascular/Lymphatic: Aorta normal caliber.  No adenopathy. Reproductive: Unremarkable Other: BILATERAL inguinal hernias containing fat. No free air or free fluid. Tiny umbilical hernia containing fat. Musculoskeletal: Unremarkable IMPRESSION: Post cholecystectomy with a small postoperative fluid collection at the gallbladder fossa measuring 3.2 x 2.0 x 2.6 cm in size, could be sterile or infected. 3 mm distal CBD calcification consistent with choledocholithiasis, newly located at this position since the previous exam; no biliary dilatation identified. Electronically Signed   By: Lavonia Dana M.D.   On: 11/06/2017 17:37     Impression / Plan:   57 y/o male with a h/o CHF, OSA, history of cholecystitis s/p cholecystectomy in June which was complicated by a bile leak and failed ERCP at that time, managed conservatively,  who now presents with choledocholithiasis and a small fluid collection in the GB fossa. He's hemodynamically stable, pain controlled. Bilirubin trending up from 3.2 to 4.9 today, and ALT remains elevated as well.  ERCP is recommended to remove the stone and assess the bile duct and see if the leak has healed. I have discussed ERCP with the patient, risks / benefits of that and anesthesia. Question is the timing of this. I spoke with Dr. Benson Norway who is covering advanced endoscopy this weekend. Tentatively will try to add him to the schedule for tomorrow morning if anesthesia is available. Given the previous failed ERCP attempt however, this may be technically challenging. It is possible he may need referral to a tertiary care center if the bile duct cannot be cannulated. Please keep NPI after MN.  Call with questions.  [place] Cellar, MD [facility]    UPDATE: ERCP scheduled for tomorrow AM at 1100 with Dr. Benson Norway. Please keep NPO after MN.

## 2017-11-07 NOTE — H&P (View-Only) (Signed)
Consultation  Referring Provider:     Mitzi Hansen Primary Care Physician:  [facility] Primary Gastroenterologist:       Wilfrid Lund  Reason for Consultation:     choledocholithiasis         HPI:   Nicholas Gray is a 57 y.o. male with a history of cholecystitis s/p lap chole on June 3rd, who developed a bile leak post-operatively s/p failed ERCP attempt and managed with drain placement, readmitted with choledocholithiasis.  Patient had ERCP by DR. Danis on 09/11/2017 - it was a difficult cannulation and biliary tree was not able to be stented. He was managed conservatively and had a drain in place for a period of time and was feeling better.   Unfortunately he developed RUQ to epigastric pain yesterday. CT abdomen and pelvis shows a 64mm distal CBD stone c/w  choledocholithiasis, as well as a small fluid collection in the gallbladder fossa.  He's had an elevation of bilirubin and ALT. Pain was severe last night but improved with pain medication today. No fevers. He has been empirically treated with Zosyn.  He does have a history of CHF, EF 25-30%, and OSA on CPAP. Other than mild discomfort he denies any other new complaints at present time. States he feels much better today.   Past Medical History:  Diagnosis Date  . Arthritis    gout  . Cardiomyopathy    EF 30-40% (Normal coronaries cath 2010).  Echo 2015 EF 40%  . CHF (congestive heart failure) ([place])   . Colonic polyp   . Diverticulosis of colon   . GERD (gastroesophageal reflux disease)   . History of chicken pox   . HLD (hyperlipidemia)    recent  . HTN (hypertension)    recent  . Sleep apnea    does use CPAP.      Past Surgical History:  Procedure Laterality Date  . CARDIAC CATHETERIZATION  2010  . CHOLECYSTECTOMY N/A 09/07/2017   Procedure: LAPAROSCOPIC CHOLECYSTECTOMY;  Surgeon: Kinsinger, Arta Bruce, MD;  Location: [place];  Service: General;  Laterality: N/A;  LAPAROSCOPIC CHOLECYSTECTOMY    . ERCP N/A 09/11/2017   Procedure: ENDOSCOPIC RETROGRADE CHOLANGIOPANCREATOGRAPHY (ERCP);  Surgeon: Doran Stabler, MD;  Location: [place];  Service: Gastroenterology;  Laterality: N/A;  . RIGHT/LEFT HEART CATH AND CORONARY ANGIOGRAPHY N/A 10/12/2017   Procedure: RIGHT/LEFT HEART CATH AND CORONARY ANGIOGRAPHY;  Surgeon: Larey Dresser, MD;  Location: [place] [facility];  Service: Cardiovascular;  Laterality: N/A;  . TONSILLECTOMY AND ADENOIDECTOMY      Family History  Problem Relation Age of Onset  . Arthritis Unknown        family history  . Diabetes Unknown        family history  . Hyperlipidemia Unknown        family history  . Lung cancer Unknown        family history  . Stroke Unknown        family history     Social History   Tobacco Use  . Smoking status: Former Smoker    Packs/day: 1.50    Years: 3.00    Pack years: 4.50    Last attempt to quit: 04/07/1980    Years since quitting: 37.6  . Smokeless tobacco: Never Used  Substance Use Topics  . Alcohol use: No  . Drug use: No    Comment: quit in 1999, marijuana    Prior to Admission medications  Medication Sig Start Date End Date Taking? Authorizing Provider  allopurinol (ZYLOPRIM) 100 MG tablet Take 100 mg by mouth 2 (two) times daily.    Yes [provider]  carvedilol (COREG) 25 MG tablet TAKE (1) TABLET BY MOUTH TWICE DAILY WITH A MEAL. 01/09/17  Yes Minus Breeding, MD  furosemide (LASIX) 20 MG tablet Take 1 tablet (20 mg total) by mouth daily. 10/21/17  Yes Larey Dresser, MD  potassium chloride SA (K-DUR,KLOR-CON) 20 MEQ tablet Take 2 tablets (40 mEq total) by mouth daily. Patient taking differently: Take 20 mEq by mouth 2 (two) times daily.  10/21/17  Yes Larey Dresser, MD  ranitidine (ZANTAC) 150 MG tablet Take 150 mg by mouth daily.   Yes [provider]  sacubitril-valsartan (ENTRESTO) 24-26 MG Take 1 tablet by mouth 2 (two) times daily. 09/25/17  Yes Larey Dresser, MD   simvastatin (ZOCOR) 40 MG tablet Take 40 mg by mouth at bedtime.   Yes [provider]  pantoprazole (PROTONIX) 40 MG tablet Take 1 tablet (40 mg total) by mouth 2 (two) times daily before a meal for 14 days. Patient not taking: Reported on 11/06/2017 09/05/17 10/12/17  Bonnielee Haff, MD    Current Facility-Administered Medications  Medication Dose Route Frequency Provider Last Rate Last Dose  . 0.9 % NaCl with KCl 20 mEq/ L  infusion   Intravenous Continuous Nita Sells, MD 50 mL/hr at 11/07/17 0816    . acetaminophen (TYLENOL) tablet 650 mg  650 mg Oral Q6H PRN Opyd, Ilene Qua, MD       Or  . acetaminophen (TYLENOL) suppository 650 mg  650 mg Rectal Q6H PRN Opyd, Ilene Qua, MD      . carvedilol (COREG) tablet 25 mg  25 mg Oral BID WC Nita Sells, MD   25 mg at 11/07/17 1024  . famotidine (PEPCID) IVPB 20 mg premix  20 mg Intravenous Q12H Opyd, Ilene Qua, MD 100 mL/hr at 11/07/17 1025 20 mg at 11/07/17 1025  . fentaNYL (SUBLIMAZE) injection 25-50 mcg  25-50 mcg Intravenous Q2H PRN Opyd, Ilene Qua, MD   50 mcg at 11/07/17 0351  . ondansetron (ZOFRAN) tablet 4 mg  4 mg Oral Q6H PRN Opyd, Ilene Qua, MD       Or  . ondansetron (ZOFRAN) injection 4 mg  4 mg Intravenous Q6H PRN Opyd, Ilene Qua, MD        Allergies as of 11/06/2017  . (No Known Allergies)     Review of Systems:    As per HPI, otherwise negative    Physical Exam:  Vital signs in last 24 hours: Temp:  [97.7 F (36.5 C)-99.9 F (37.7 C)] 99.9 F (37.7 C) (08/03 0511) Pulse Rate:  [65-89] 71 (08/03 0511) Resp:  [16-33] 20 (08/03 0511) BP: (130-150)/(61-88) 131/65 (08/03 0511) SpO2:  [93 %-100 %] 96 % (08/03 0511) Weight:  [252 lb (114.3 kg)-256 lb 2.8 oz (116.2 kg)] 256 lb 2.8 oz (116.2 kg) (08/02 2015) Last BM Date: 11/07/17 General:   Pleasant male in NAD Head:  Normocephalic and atraumatic. Eyes:   scleral icterus.   Conjunctiva pink. Ears:  Normal auditory acuity. Neck:  Supple Lungs:   Respirations even and unlabored. Lungs clear to auscultation bilaterally.    Heart:  Regular rate and rhythm; no MRG Abdomen:  Soft, nondistended, mild tenderness in epigastric and RUQ area. . No appreciable masses or hepatomegaly.  Rectal:  Not performed.  Msk:  Symmetrical without gross deformities.  Extremities:  Without edema. Neurologic:  Alert and  oriented x4;  grossly normal neurologically. Skin:  Intact without significant lesions or rashes. Psych:  Alert and cooperative. Normal affect.  LAB RESULTS: Recent Labs    11/06/17 1145 11/07/17 0445  WBC 6.1 8.3  HGB 10.7* 11.2*  HCT 33.0* 34.0*  PLT 197 176   BMET Recent Labs    11/06/17 1145 11/07/17 0445  NA 137 136  K 3.7 3.5  CL 102 103  CO2 24 24  GLUCOSE 134* 124*  BUN 13 8  CREATININE 1.04 1.08  CALCIUM 8.6* 7.8*   LFT Recent Labs    11/06/17 1659 11/07/17 0445  PROT 7.9 6.2*  ALBUMIN 4.4 3.2*  AST 587* 336*  ALT 387* 386*  ALKPHOS 145* 124  BILITOT 3.2* 4.9*  BILIDIR 1.6*  --   IBILI 1.6*  --    PT/INR No results for input(s): LABPROT, INR in the last 72 hours.  STUDIES: Dg Chest 2 View  Result Date: 11/06/2017 CLINICAL DATA:  Chest pressure and pain beginning this morning, history CHF, hypertension EXAM: CHEST - 2 VIEW COMPARISON:  09/03/2017 FINDINGS: Enlargement of cardiac silhouette. Mediastinal contours and pulmonary vascularity normal. Bibasilar atelectasis. Lungs otherwise clear. No pulmonary infiltrate, pleural effusion or pneumothorax. Bones unremarkable. IMPRESSION: Mild enlargement of cardiac silhouette with bibasilar atelectasis. Electronically Signed   By: Lavonia Dana M.D.   On: 11/06/2017 12:28   Ct Abdomen Pelvis W Contrast  Result Date: 11/06/2017 CLINICAL DATA:  Acute abdominal pain, RIGHT upper quadrant pain beginning this morning, recent cholecystectomy, history dilated cardiomyopathy, CHF, essential hypertension, former smoker EXAM: CT ABDOMEN AND PELVIS WITH CONTRAST TECHNIQUE:  Multidetector CT imaging of the abdomen and pelvis was performed using the standard protocol following bolus administration of intravenous contrast. Sagittal and coronal MPR images reconstructed from axial data set. CONTRAST:  125mL OMNIPAQUE IOHEXOL 300 MG/ML SOLN IV. No oral contrast. COMPARISON:  09/12/2017 FINDINGS: Lower chest: Subsegmental atelectasis RIGHT base Hepatobiliary: Post cholecystectomy. Small fluid collection at gallbladder fossa, could be a sterile or infected postoperative collection measuring 3.2 x 2.0 x 2.6 cm. Liver otherwise normal appearance. No biliary dilatation identified. However, a 3 mm diameter calcification is seen in the distal CBD within the pancreatic head consistent with choledocholithiasis. On the prior study a calcification of similar size with seen within the cystic duct stump. Pancreas: Normal appearance Spleen: Normal appearance Adrenals/Urinary Tract: Adrenal glands, kidneys, ureters, and bladder normal appearance Stomach/Bowel: Nonvisualization of appendix but no pericecal inflammatory process identified. Stomach and bowel loops otherwise normal appearance. Vascular/Lymphatic: Aorta normal caliber.  No adenopathy. Reproductive: Unremarkable Other: BILATERAL inguinal hernias containing fat. No free air or free fluid. Tiny umbilical hernia containing fat. Musculoskeletal: Unremarkable IMPRESSION: Post cholecystectomy with a small postoperative fluid collection at the gallbladder fossa measuring 3.2 x 2.0 x 2.6 cm in size, could be sterile or infected. 3 mm distal CBD calcification consistent with choledocholithiasis, newly located at this position since the previous exam; no biliary dilatation identified. Electronically Signed   By: Lavonia Dana M.D.   On: 11/06/2017 17:37     Impression / Plan:   57 y/o male with a h/o CHF, OSA, history of cholecystitis s/p cholecystectomy in June which was complicated by a bile leak and failed ERCP at that time, managed conservatively,  who now presents with choledocholithiasis and a small fluid collection in the GB fossa. He's hemodynamically stable, pain controlled. Bilirubin trending up from 3.2 to 4.9 today, and ALT remains elevated as well.  ERCP is recommended to remove the stone and assess the bile duct and see if the leak has healed. I have discussed ERCP with the patient, risks / benefits of that and anesthesia. Question is the timing of this. I spoke with Dr. Benson Norway who is covering advanced endoscopy this weekend. Tentatively will try to add him to the schedule for tomorrow morning if anesthesia is available. Given the previous failed ERCP attempt however, this may be technically challenging. It is possible he may need referral to a tertiary care center if the bile duct cannot be cannulated. Please keep NPI after MN.  Call with questions.  Fenwick Cellar, MD [facility]    UPDATE: ERCP scheduled for tomorrow AM at 1100 with Dr. Benson Norway. Please keep NPO after MN.

## 2017-11-07 NOTE — Progress Notes (Signed)
[facility] PROGRESS NOTE  Nicholas Gray ETK:[id number] DOB: 06/30/1960 DOA: 11/06/2017 PCP: [facility]   Narrative: 57 year old male chronic systolic heart failure-last EF 08/27/2017 25-30%, NICM with normal coronaries 2010 LBB followed by Dr. Algernon Huxley of heart failure team BMI 36, OSA on CPAP Recent admission 09/07/2017 gangrenous gallbladder status post cholecystectomy with bile leak 09/09/2017 ERCP done 09/11/2017 with failed stent placement did a PERC drain Re-presented 11/06/2017 severe epigastric pain CT abdomen pelvis CBD calcification +?  Choledocholithiasis Transaminitis AST 587/ALT 387-T bili 3.2 General surgery consulted by ED as well as GI   A & Plan Choledocholithiasis with prior PERC drain/failed stent-GI to see-?  Intervention per them-transaminitis slowly resolving--Dr. Havery Moros of GI may be doing an ERCP and patient is n.p.o.--defer diet and graduation of the same per them-- on Zosyn--pain is controlled on pain meds Chronic bimodal heart failure + pulmonary hypertension-initially placed on saline 65 cc/H with K--Lasix 20, Coreg 25 twice resumed as slightly tachycardic this morning Sinus tachycardia-secondary to beta-blocker withdrawal-see above CKD 1-see above discussion regarding fluids     DVT prophylaxis: Lovenox  Code Status: Full family Communication: None disposition Plan: Dependent on consultant impressions and planning   Marcelyn Ruppe, MD  [facility] Direct contact: [phone] --Via amion app OR  --[email or web address]; password [email or web address]  7PM-7AM contact night coverage as above 11/07/2017, 7:30 AM  LOS: 1 day   Consultants:  Gi, gen surg  Procedures:  no  Antimicrobials:  zosyn  Interval history/Subjective: Pleasant abdominal pain is minimal No chest pain No fever Watching shark week on [place]  Objective:  Vitals:  Vitals:   11/06/17 2015 11/07/17 0511  BP: 134/63 131/65  Pulse: 84 71  Resp: (!) 22 20  Temp: 99.7 F  (37.6 C) 99.9 F (37.7 C)  SpO2: 94% 96%    Exam:  EOMI NCAT mild icterus no pallor-morbidly obese Chest clear S1-S2 tachycardic sinus tach Abdomen obese slightly tender in epigastrium postop scars from cholecystectomy and PERC drain noted Neurologically intact Euthymic Moves all 4 limbs equally No rash  I have personally reviewed the following:   Labs:  AST/ALT now down to 300 range from the 500 alk phos now 124 down from 145, T bili up from 3.2-4.9  Hemoglobin 11.2 WBC 8.3  Imaging studies:  CT scan  Medical tests:  None  Test discussed with performing physician:  No  Decision to obtain old records:  Yes  Review and summation of old records:  yes  Scheduled Meds: Continuous Infusions: . famotidine (PEPCID) IV 100 mL/hr at 11/06/17 2219    Principal Problem:   Choledocholithiasis Active Problems:   OSA (obstructive sleep apnea)   Chronic systolic CHF (congestive heart failure) (HCC)   LOS: 1 day

## 2017-11-07 NOTE — Progress Notes (Signed)
Placed patient on CPAP for the night with pressure set at [place]

## 2017-11-08 ENCOUNTER — Inpatient Hospital Stay (HOSPITAL_COMMUNITY): Payer: 59 | Admitting: Certified Registered Nurse Anesthetist

## 2017-11-08 ENCOUNTER — Encounter (HOSPITAL_COMMUNITY)
Admission: EM | Disposition: A | Discharge status: Home / Self Care | Payer: Self-pay | Source: Home / Self Care | Attending: Family Medicine

## 2017-11-08 ENCOUNTER — Inpatient Hospital Stay (HOSPITAL_COMMUNITY): Payer: 59

## 2017-11-08 ENCOUNTER — Encounter (HOSPITAL_COMMUNITY): Payer: Self-pay

## 2017-11-08 HISTORY — PX: BILIARY STENT PLACEMENT: SHX5538

## 2017-11-08 HISTORY — PX: SPHINCTEROTOMY: SHX5544

## 2017-11-08 HISTORY — PX: REMOVAL OF STONES: SHX5545

## 2017-11-08 HISTORY — PX: ERCP: SHX5425

## 2017-11-08 LAB — CBC WITH DIFFERENTIAL/PLATELET
ABS IMMATURE GRANULOCYTES: 0.1 10*3/uL (ref 0.0–0.1)
Basophils Absolute: 0 10*3/uL (ref 0.0–0.1)
Basophils Relative: 0 %
Eosinophils Absolute: 0.2 10*3/uL (ref 0.0–0.7)
Eosinophils Relative: 2 %
HCT: 32.2 % — ABNORMAL LOW (ref 39.0–52.0)
HEMOGLOBIN: 10.6 g/dL — AB (ref 13.0–17.0)
Immature Granulocytes: 1 %
LYMPHS PCT: 10 %
Lymphs Abs: 0.7 10*3/uL (ref 0.7–4.0)
MCH: 30.9 pg (ref 26.0–34.0)
MCHC: 32.9 g/dL (ref 30.0–36.0)
MCV: 93.9 fL (ref 78.0–100.0)
Monocytes Absolute: 0.7 10*3/uL (ref 0.1–1.0)
Monocytes Relative: 10 %
Neutro Abs: 5.4 10*3/uL (ref 1.7–7.7)
Neutrophils Relative %: 77 %
Platelets: 179 10*3/uL (ref 150–400)
RBC: 3.43 MIL/uL — ABNORMAL LOW (ref 4.22–5.81)
RDW: 13 % (ref 11.5–15.5)
WBC: 7.1 10*3/uL (ref 4.0–10.5)

## 2017-11-08 LAB — COMPREHENSIVE METABOLIC PANEL
ALK PHOS: 123 U/L (ref 38–126)
ALT: 267 U/L — ABNORMAL HIGH (ref 0–44)
AST: 135 U/L — ABNORMAL HIGH (ref 15–41)
Albumin: 3.2 g/dL — ABNORMAL LOW (ref 3.5–5.0)
Anion gap: 11 (ref 5–15)
BUN: 10 mg/dL (ref 6–20)
CALCIUM: 7.8 mg/dL — AB (ref 8.9–10.3)
CO2: 22 mmol/L (ref 22–32)
Chloride: 102 mmol/L (ref 98–111)
Creatinine, Ser: 1.15 mg/dL (ref 0.61–1.24)
GFR calc Af Amer: >60 mL/min (ref >60)
GFR calc non Af Amer: >60 mL/min (ref >60)
Glucose, Bld: 79 mg/dL (ref 70–99)
Potassium: 3.3 mmol/L — ABNORMAL LOW (ref 3.5–5.1)
Sodium: 135 mmol/L (ref 135–145)
Total Bilirubin: 4.6 mg/dL — ABNORMAL HIGH (ref 0.3–1.2)
Total Protein: 6.2 g/dL — ABNORMAL LOW (ref 6.5–8.1)

## 2017-11-08 SURGERY — ERCP, WITH INTERVENTION IF INDICATED
Anesthesia: General

## 2017-11-08 MED ORDER — ROCURONIUM BROMIDE 10 MG/ML (PF) SYRINGE
PREFILLED_SYRINGE | INTRAVENOUS | Status: DC | PRN
  Administered 2017-11-08: 30 mg via INTRAVENOUS

## 2017-11-08 MED ORDER — PROPOFOL 10 MG/ML IV BOLUS
INTRAVENOUS | Status: DC | PRN
  Administered 2017-11-08: 150 mg via INTRAVENOUS

## 2017-11-08 MED ORDER — INDOMETHACIN 50 MG RE SUPP
RECTAL | Status: AC
  Filled 2017-11-08: qty 2

## 2017-11-08 MED ORDER — SODIUM CHLORIDE 0.9 % IV SOLN
INTRAVENOUS | Status: DC | PRN
  Administered 2017-11-08: 25 ug/min via INTRAVENOUS

## 2017-11-08 MED ORDER — SODIUM CHLORIDE 0.9 % IV SOLN
INTRAVENOUS | Status: DC | PRN
  Administered 2017-11-08: 50 mL

## 2017-11-08 MED ORDER — MAGNESIUM SULFATE 2 GM/50ML IV SOLN
2.0000 g | Freq: Once | INTRAVENOUS | Status: AC
  Administered 2017-11-08: 2 g via INTRAVENOUS
  Filled 2017-11-08: qty 50

## 2017-11-08 MED ORDER — LIDOCAINE 2% (20 MG/ML) 5 ML SYRINGE
INTRAMUSCULAR | Status: DC | PRN
  Administered 2017-11-08: 60 mg via INTRAVENOUS

## 2017-11-08 MED ORDER — ALLOPURINOL 100 MG PO TABS
100.0000 mg | ORAL_TABLET | Freq: Two times a day (BID) | ORAL | Status: DC
  Administered 2017-11-08 – 2017-11-09 (×2): 100 mg via ORAL
  Filled 2017-11-08 (×2): qty 1

## 2017-11-08 MED ORDER — ONDANSETRON HCL 4 MG/2ML IJ SOLN
INTRAMUSCULAR | Status: DC | PRN
  Administered 2017-11-08: 4 mg via INTRAVENOUS

## 2017-11-08 MED ORDER — SUCCINYLCHOLINE CHLORIDE 200 MG/10ML IV SOSY
PREFILLED_SYRINGE | INTRAVENOUS | Status: DC | PRN
  Administered 2017-11-08: 120 mg via INTRAVENOUS

## 2017-11-08 MED ORDER — MIDAZOLAM HCL 2 MG/2ML IJ SOLN
INTRAMUSCULAR | Status: DC | PRN
  Administered 2017-11-08: 2 mg via INTRAVENOUS

## 2017-11-08 MED ORDER — PHENYLEPHRINE HCL 10 MG/ML IJ SOLN
INTRAMUSCULAR | Status: DC | PRN
  Administered 2017-11-08 (×2): 80 ug via INTRAVENOUS

## 2017-11-08 MED ORDER — DEXAMETHASONE SODIUM PHOSPHATE 10 MG/ML IJ SOLN
INTRAMUSCULAR | Status: DC | PRN
  Administered 2017-11-08: 10 mg via INTRAVENOUS

## 2017-11-08 MED ORDER — INDOMETHACIN 50 MG RE SUPP
RECTAL | Status: DC | PRN
  Administered 2017-11-08: 100 mg via RECTAL

## 2017-11-08 MED ORDER — GLUCAGON HCL RDNA (DIAGNOSTIC) 1 MG IJ SOLR
INTRAMUSCULAR | Status: AC
  Filled 2017-11-08: qty 1

## 2017-11-08 MED ORDER — LACTATED RINGERS IV SOLN
INTRAVENOUS | Status: DC
  Administered 2017-11-08: 1000 mL via INTRAVENOUS
  Administered 2017-11-08: 11:00:00 via INTRAVENOUS

## 2017-11-08 MED ORDER — FENTANYL CITRATE (PF) 250 MCG/5ML IJ SOLN
INTRAMUSCULAR | Status: DC | PRN
  Administered 2017-11-08: 50 ug via INTRAVENOUS

## 2017-11-08 MED ORDER — CIPROFLOXACIN IN D5W 400 MG/200ML IV SOLN
INTRAVENOUS | Status: DC | PRN
  Administered 2017-11-08: 400 mg via INTRAVENOUS

## 2017-11-08 MED ORDER — IOPAMIDOL (ISOVUE-300) INJECTION 61%
INTRAVENOUS | Status: AC
  Filled 2017-11-08: qty 50

## 2017-11-08 MED ORDER — SUGAMMADEX SODIUM 200 MG/2ML IV SOLN
INTRAVENOUS | Status: DC | PRN
  Administered 2017-11-08: 228.6 mg via INTRAVENOUS

## 2017-11-08 MED ORDER — PANTOPRAZOLE SODIUM 40 MG PO TBEC
40.0000 mg | DELAYED_RELEASE_TABLET | Freq: Two times a day (BID) | ORAL | Status: DC
  Administered 2017-11-08 – 2017-11-09 (×2): 40 mg via ORAL
  Filled 2017-11-08 (×2): qty 1

## 2017-11-08 MED ORDER — CIPROFLOXACIN IN D5W 400 MG/200ML IV SOLN
INTRAVENOUS | Status: AC
  Filled 2017-11-08: qty 200

## 2017-11-08 NOTE — Transfer of Care (Signed)
Immediate Anesthesia Transfer of Care Note  Patient: Nicholas Gray  Procedure(s) Performed: ENDOSCOPIC RETROGRADE CHOLANGIOPANCREATOGRAPHY (ERCP) (N/A ) SPHINCTEROTOMY REMOVAL OF STONES BILIARY STENT PLACEMENT  Patient Location: PACU  Anesthesia Type:General  Level of Consciousness: awake, alert  and oriented  Airway & Oxygen Therapy: Patient Spontanous Breathing and Patient connected to nasal cannula oxygen  Post-op Assessment: Report given to RN and Post -op Vital signs reviewed and stable  Post vital signs: Reviewed and stable  Last Vitals:  Vitals Value Taken Time  BP 128/71 11/08/2017 12:20 PM  Temp    Pulse 65 11/08/2017 12:24 PM  Resp 16 11/08/2017 12:24 PM  SpO2 98 % 11/08/2017 12:24 PM  Vitals shown include unvalidated device data.  Last Pain:  Vitals:   11/08/17 1220  TempSrc: Oral  PainSc: 0-No pain         Complications: No apparent anesthesia complications

## 2017-11-08 NOTE — Op Note (Signed)
[facility] Patient Name: Nicholas Gray Procedure Date : 11/08/2017 MRN: [id number] Attending MD: Carol Ada , MD Date of Birth: 16-Oct-1960 CSN: [id number] Age: 57 Admit Type: Inpatient Procedure:                ERCP Indications:              Common bile duct stone(s), Bile leak Providers:                Carol Ada, MD, Carolynn Comment RN, RN, Nevin Bloodgood, Technician, Clearnce Sorrel, CRNA Referring MD:              Medicines:                General Anesthesia, Indomethacin 488 mg PR Complications:            No immediate complications. Estimated Blood Loss:     Estimated blood loss: none. Procedure:                Pre-Anesthesia Assessment:                           - Prior to the procedure, a History and Physical                            was performed, and patient medications and                            allergies were reviewed. The patient's tolerance of                            previous anesthesia was also reviewed. The risks                            and benefits of the procedure and the sedation                            options and risks were discussed with the patient.                            All questions were answered, and informed consent                            was obtained. Prior Anticoagulants: The patient has                            taken no previous anticoagulant or antiplatelet                            agents. ASA Grade Assessment: III - A patient with                            severe systemic disease. After reviewing the risks  and benefits, the patient was deemed in                            satisfactory condition to undergo the procedure.                           - Sedation was administered by an anesthesia                            professional. General anesthesia was attained.                           After obtaining informed consent, the scope was          passed under direct vision. Throughout the                            procedure, the patient's blood pressure, pulse, and                            oxygen saturations were monitored continuously. The                            [id number]-[id number] ([id number]) Olympus ERCP was introduced                            through the mouth, and used to inject contrast into                            and used to inject contrast into the bile duct and                            dorsal pancreatic duct. The ERCP was somewhat                            difficult. The patient tolerated the procedure well. Scope In: Scope Out: Findings:      A short 0.035 inch Soft Jagwire was passed into the biliary tree. A 10       mm biliary sphincterotomy was made with a traction (standard)       sphincterotome using ERBE electrocautery. There was no       post-sphincterotomy bleeding. The biliary tree was swept with a 12 mm       balloon starting at the bifurcation. One stone was removed. No stones       remained. One 8.5 Fr by 5 cm plastic stent with a single external flap       and a single internal flap was placed 4.5 cm into the common bile duct.       Bile flowed through the stent. The stent was in good position.       Extravasation of contrast originating from the in the biliary system was       observed.      The patient had a larger ampulla. The initial cannulation resulted in       the wire entering the PD. Repositioning resulted in deeper cannulation       of  the main PD and the wire was left in place. A secondary wire was used       and after several attempts the CBD was cannulated. The wire was secured       in the right intrahepatic ducts. Contrast injection did not show an       overt stone in the distal CBD, but there was suspicion of a distal       lucency. The CBD measured apprxoimately 8-9 mm. A 10 mm sphincterotomy       was created and there was excellent bile drainage. The CBD was swept       three  times. The first sweep extracted the small black stone. An image       was not possible as it passed distally very quickly. The subsequent       sweeps did not reveal any further retained stones. An occlusion       cholangiogram was performed and it was significant for a bile leak. The       location was infrahepatic on the right lobe of the liver. The exact site       of leak was not able to be clearly identified. An 8.5 Fr. X 5 cm stent       was placed with excellent positioning. Impression:               - A bile leak was found. This was treated with                            stent placement.                           - Choledocholithiasis was found. Complete removal                            was accomplished by biliary sphincterotomy and                            balloon extraction.                           - A biliary sphincterotomy was performed.                           - The biliary tree was swept.                           - One plastic stent was placed into the common bile                            duct. Recommendation:           - Return patient to hospital ward for ongoing care.                           - Resume regular diet.                           - Repeat ERCP in 3 months to remove stent with  [facility]. Procedure Code(s):        --- Professional ---                           [phone], Endoscopic retrograde                            cholangiopancreatography (ERCP); with placement of                            endoscopic stent into biliary or pancreatic duct,                            including pre- and post-dilation and guide wire                            passage, when performed, including sphincterotomy,                            when performed, each stent                           [id number], Endoscopic retrograde                            cholangiopancreatography (ERCP); with removal of                            calculi/debris from  biliary/pancreatic duct(s) Diagnosis Code(s):        --- Professional ---                           K83.9, Disease of biliary tract, unspecified                           K80.50, Calculus of bile duct without cholangitis                            or cholecystitis without obstruction                           K83.8, Other specified diseases of biliary tract CPT copyright 2017 American Medical Association. All rights reserved. The codes documented in this report are preliminary and upon coder review may  be revised to meet current compliance requirements. Carol Ada, MD Carol Ada, MD 11/08/2017 12:20:37 PM This report has been signed electronically. Number of Addenda: 0

## 2017-11-08 NOTE — Progress Notes (Signed)
[facility] PROGRESS NOTE  Nicholas Gray ZPH:[id number] DOB: 1961/03/10 DOA: 11/06/2017 PCP: [facility]   Narrative: 57 year old male chronic systolic heart failure-last EF 08/27/2017 25-30%, NICM with normal coronaries 2010 LBB followed by Dr. Algernon Huxley of heart failure team BMI 36, OSA on CPAP Recent admission 09/07/2017 gangrenous gallbladder status post cholecystectomy with bile leak 09/09/2017 ERCP done 09/11/2017 with failed stent placement did a PERC drain Re-presented 11/06/2017 severe epigastric pain CT abdomen pelvis CBD calcification +?  Choledocholithiasis Transaminitis AST 587/ALT 387-T bili 3.2 General surgery consulted by ED as well as GI   A & Plan Choledocholithiasis with prior PERC drain/failed stent-GI to see-?  ERCP done Dr. Benson Norway 8/4-transaminitis  resolving--.--defer diet and graduation of the same per them-- on Zosyn--pain is controlled on pain meds--Needs stent removal ~ 02/08/18 Chronic bimodal heart failure + pulmonary hypertension-initially placed on saline 65 cc/H with K--Lasix 20, Coreg 25 twice resumed -tachy resolved Sinus tachycardia-secondary to beta-blocker withdrawal-see above CKD 1-see above discussion regarding fluids     DVT prophylaxis: Lovenox  Code Status: Full family Communication: None disposition Plan: Dependent on consultant impressions and planning   [name], MD  [facility] Direct contact: [phone] --Via amion app OR  --[email or web address]; password [email or web address]  7PM-7AM contact night coverage as above 11/08/2017, 5:19 PM  LOS: 2 days   Consultants:  Gi, gen surg  Procedures:  no  Antimicrobials:  zosyn  Interval history/Subjective:  No issues Seen this am  Ready for procedure Pain controlled  Objective:  Vitals:  Vitals:   11/08/17 1252 11/08/17 1300  BP: 131/71 (!) 145/67  Pulse: 60 (!) 57  Resp: 16 14  Temp: 98.1 F (36.7 C) 97.7 F (36.5 C)  SpO2: 95% 95%    Exam:  EOMI NCAT mild icterus  no pallor-morbidly obese Chest clear S1-S2 tachycardic sinus tach Abdomen obese slightly tender in epigastrium postop scars from cholecystectomy and PERC drain noted Neurologically intact Euthymic Moves all 4 limbs equally No rash  I have personally reviewed the following:   Labs:  AST/ALT now down to 100 range from the 500   alk phos 123 down from 145,   T bili up from 3.2-4.9-->4.6  Hemoglobin 11.2 WBC 8.3  Imaging studies:  CT scan  Medical tests:  None  Test discussed with performing physician:  No  Decision to obtain old records:  Yes  Review and summation of old records:  yes  Scheduled Meds: . carvedilol  25 mg Oral BID WC   Continuous Infusions: . famotidine (PEPCID) IV 20 mg (11/08/17 1300)    Principal Problem:   Choledocholithiasis Active Problems:   OSA (obstructive sleep apnea)   Chronic systolic CHF (congestive heart failure) (HCC)   LOS: 2 days

## 2017-11-08 NOTE — Anesthesia Preprocedure Evaluation (Addendum)
Anesthesia Evaluation  Patient identified by MRN, date of birth, ID band Patient awake    Reviewed: Allergy & Precautions, NPO status , Patient's Chart, lab work & pertinent test results  Airway Mallampati: II  TM Distance: >3 FB Neck ROM: Full    Dental  (+) Teeth Intact, Dental Advisory Given   Pulmonary sleep apnea , former smoker,    breath sounds clear to auscultation       Cardiovascular hypertension, +CHF  + dysrhythmias  Rhythm:Regular Rate:Normal  7/19 Cath: no significant CAD. EF 25-30% on 08/2017 Echo.   Neuro/Psych negative neurological ROS     GI/Hepatic Neg liver ROS, GERD  ,  Endo/Other  negative endocrine ROS  Renal/GU negative Renal ROS     Musculoskeletal  (+) Arthritis ,   Abdominal   Peds  Hematology negative hematology ROS (+)   Anesthesia Other Findings   Reproductive/Obstetrics                            Lab Results  Component Value Date   WBC 7.1 11/08/2017   HGB 10.6 (L) 11/08/2017   HCT 32.2 (L) 11/08/2017   MCV 93.9 11/08/2017   PLT 179 11/08/2017   Lab Results  Component Value Date   CREATININE 1.08 11/07/2017   BUN 8 11/07/2017   NA 136 11/07/2017   K 3.5 11/07/2017   CL 103 11/07/2017   CO2 24 11/07/2017    Anesthesia Physical  Anesthesia Plan  ASA: III  Anesthesia Plan: General   Post-op Pain Management:    Induction: Intravenous  PONV Risk Score and Plan: 3 and Ondansetron, Dexamethasone and Treatment may vary due to age or medical condition  Airway Management Planned: Oral ETT  Additional Equipment:   Intra-op Plan:   Post-operative Plan: Extubation in OR  Informed Consent: I have reviewed the patients History and Physical, chart, labs and discussed the procedure including the risks, benefits and alternatives for the proposed anesthesia with the patient or authorized representative who has indicated his/her understanding and  acceptance.   Dental advisory given  Plan Discussed with: CRNA and Anesthesiologist  Anesthesia Plan Comments:         Anesthesia Quick Evaluation

## 2017-11-08 NOTE — Anesthesia Postprocedure Evaluation (Signed)
Anesthesia Post Note  Patient: Nicholas Gray  Procedure(s) Performed: ENDOSCOPIC RETROGRADE CHOLANGIOPANCREATOGRAPHY (ERCP) (N/A ) SPHINCTEROTOMY REMOVAL OF STONES BILIARY STENT PLACEMENT     Patient location during evaluation: PACU Anesthesia Type: General Level of consciousness: awake and alert Pain management: pain level controlled Vital Signs Assessment: post-procedure vital signs reviewed and stable Respiratory status: spontaneous breathing, nonlabored ventilation, respiratory function stable and patient connected to nasal cannula oxygen Cardiovascular status: blood pressure returned to baseline and stable Postop Assessment: no apparent nausea or vomiting Anesthetic complications: no    Last Vitals:  Vitals:   11/08/17 1234 11/08/17 1252  BP: (!) 141/70 131/71  Pulse: 63 60  Resp: 14 16  Temp:  36.7 C  SpO2: 94% 95%    Last Pain:  Vitals:   11/08/17 1252  TempSrc: Oral  PainSc: 2                  Tiajuana Amass

## 2017-11-08 NOTE — Progress Notes (Signed)
Received back from PACU alert and oriented.

## 2017-11-08 NOTE — Interval H&P Note (Signed)
History and Physical Interval Note:  11/08/2017 11:04 AM  Nicholas Gray  has presented today for surgery, with the diagnosis of choledocholithiasis  The various methods of treatment have been discussed with the patient and family. After consideration of risks, benefits and other options for treatment, the patient has consented to  Procedure(s): ENDOSCOPIC RETROGRADE CHOLANGIOPANCREATOGRAPHY (ERCP) (N/A) as a surgical intervention .  The patient's history has been reviewed, patient examined, no change in status, stable for surgery.  I have reviewed the patient's chart and labs.  Questions were answered to the patient's satisfaction.     Nydia Ytuarte D

## 2017-11-08 NOTE — Anesthesia Procedure Notes (Signed)
Procedure Name: Intubation Date/Time: 11/08/2017 11:16 AM Performed by: Clearnce Sorrel, CRNA Pre-anesthesia Checklist: Patient identified, Emergency Drugs available, Suction available, Patient being monitored and Timeout performed Patient Re-evaluated:Patient Re-evaluated prior to induction Oxygen Delivery Method: Circle system utilized Preoxygenation: Pre-oxygenation with 100% oxygen Induction Type: IV induction Ventilation: Mask ventilation without difficulty Laryngoscope Size: Mac and 3 Grade View: Grade I Tube type: Oral Tube size: 7.5 mm Number of attempts: 1 Airway Equipment and Method: Stylet Placement Confirmation: ETT inserted through vocal cords under direct vision,  positive ETCO2 and breath sounds checked- equal and bilateral Secured at: 22 cm Tube secured with: Tape Dental Injury: Teeth and Oropharynx as per pre-operative assessment

## 2017-11-09 ENCOUNTER — Encounter (HOSPITAL_COMMUNITY): Payer: Self-pay | Admitting: General Practice

## 2017-11-09 ENCOUNTER — Encounter: Payer: Self-pay | Admitting: Family Medicine

## 2017-11-09 ENCOUNTER — Other Ambulatory Visit: Payer: Self-pay

## 2017-11-09 LAB — COMPREHENSIVE METABOLIC PANEL
ALK PHOS: 110 U/L (ref 38–126)
ALT: 187 U/L — AB (ref 0–44)
AST: 60 U/L — AB (ref 15–41)
Albumin: 3.1 g/dL — ABNORMAL LOW (ref 3.5–5.0)
Anion gap: 9 (ref 5–15)
BUN: 11 mg/dL (ref 6–20)
CALCIUM: 8 mg/dL — AB (ref 8.9–10.3)
CO2: 25 mmol/L (ref 22–32)
CREATININE: 0.99 mg/dL (ref 0.61–1.24)
Chloride: 102 mmol/L (ref 98–111)
Glucose, Bld: 169 mg/dL — ABNORMAL HIGH (ref 70–99)
Potassium: 3.8 mmol/L (ref 3.5–5.1)
SODIUM: 136 mmol/L (ref 135–145)
Total Bilirubin: 1.9 mg/dL — ABNORMAL HIGH (ref 0.3–1.2)
Total Protein: 6.5 g/dL (ref 6.5–8.1)

## 2017-11-09 LAB — CBC WITH DIFFERENTIAL/PLATELET
ABS IMMATURE GRANULOCYTES: 0.1 10*3/uL (ref 0.0–0.1)
BASOS PCT: 0 %
Basophils Absolute: 0 10*3/uL (ref 0.0–0.1)
EOS ABS: 0 10*3/uL (ref 0.0–0.7)
EOS PCT: 0 %
HEMATOCRIT: 33.7 % — AB (ref 39.0–52.0)
HEMOGLOBIN: 11.1 g/dL — AB (ref 13.0–17.0)
Immature Granulocytes: 1 %
LYMPHS ABS: 0.6 10*3/uL — AB (ref 0.7–4.0)
Lymphocytes Relative: 7 %
MCH: 30.6 pg (ref 26.0–34.0)
MCHC: 32.9 g/dL (ref 30.0–36.0)
MCV: 92.8 fL (ref 78.0–100.0)
MONOS PCT: 9 %
Monocytes Absolute: 0.7 10*3/uL (ref 0.1–1.0)
Neutro Abs: 6.2 10*3/uL (ref 1.7–7.7)
Neutrophils Relative %: 83 %
Platelets: 191 10*3/uL (ref 150–400)
RBC: 3.63 MIL/uL — ABNORMAL LOW (ref 4.22–5.81)
RDW: 12.9 % (ref 11.5–15.5)
WBC: 7.5 10*3/uL (ref 4.0–10.5)

## 2017-11-09 MED ORDER — LEVOFLOXACIN 500 MG PO TABS: 500.0000 mg | ORAL_TABLET | Freq: Every day | ORAL | 0 refills | Status: AC

## 2017-11-09 NOTE — Progress Notes (Signed)
RT set up CPAP at patient beside. NO O2 bleed in needed. Patient is able to place himself on and off when he is ready. No assistance is needed.

## 2017-11-09 NOTE — Progress Notes (Signed)
Discharge home. Home discharge instruction given, no question asked °

## 2017-11-09 NOTE — Consult Note (Addendum)
[facility] Progress Note   Chief Complaint:   Abdominal pain / CBD stone    SUBJECTIVE:   Overall feels much better.  Still having some intermittent right upper quadrant discomfort   ASSESSMENT AND PLAN:   1. 57 yo male with hx of cholecystitis, s/p lap chole 09/07/17. Developed post-op bile leak, s/p failed ERCP. Bile leak managed with drain which was eventually removed. Readmitted a couple of days ago with abdominal pain /  Choledocholithiasis / small fluid collection at GB fossa. He is s/p ERCP with sphincterotomy and stone extraction. Occlusion cholangiogram showed a bile leak at the infrahepatic on the right lobe of the liver.  Exact location of the leak could not be clearly identified.  A 5 cm stent was placed in the bile duct. Hopefully leak will heal be able to completely heal.  Liver chemistries improving, bilirubin has come down nicely -he will need repeat ERCP in 3 months to remove stent.  Will need to be seen in office first.  -okay for discharge GI standpoint.  Needs Levaquin for 7 days upon discharge. -Also please generate letter that patient cannot do any lifting for the next 10 days.  -Our office will contact patient in 6 to 8 weeks for clinic visit prior to ERCP  2. CHF, EF 25-30%  3. OSA, on CPAP   Attending physician's note   I have taken an interval history, reviewed the chart and examined the patient. I agree with the Advanced Practitioner's note, impression and recommendations.   57 year old with acute cholecystitis status post lap chole 09/07/2017 with postop bile leak due to choledocholithiasis status post ERCP with biliary sphincterotomy, stone extraction and Endo biliary stent insertion (8.5 Fr 5 cm) 11/08/2017.  Plan: He would need repeat ERCP in 3 months to reevaluate biliary tree and stent removal.  Antibiotics for next 7 days.  Follow-up in the GI clinic in 6 to 8 weeks.  He is being discharged today.  Carmell Austria, MD  OBJECTIVE:      Vital signs in last 24 hours: Temp:  [97.5 F (36.4 C)-98.5 F (36.9 C)] 97.5 F (36.4 C) (08/05 0411) Pulse Rate:  [56-65] 56 (08/05 0411) Resp:  [13-20] 18 (08/05 0411) BP: (128-145)/(64-77) 140/77 (08/05 0411) SpO2:  [94 %-99 %] 99 % (08/05 0411) Weight:  [253 lb 12 oz (115.1 kg)] 253 lb 12 oz (115.1 kg) (08/05 6256) Last BM Date: 11/08/17 General:   Alert, well-developed, male in NAD EENT:  Normal hearing, non icteric sclera, conjunctive pink.  Heart:  Regular rate and rhythm. No lower extremity edema Pulm: Normal respiratory effort, lungs CTA bilaterally without wheezes or crackles. Abdomen:  Soft, nondistended, nontender.  Normal bowel sounds, no masses felt.     Neurologic:  Alert and  oriented x4;  grossly normal neurologically. Psych:  Pleasant, cooperative.  Normal mood and affect.   Intake/Output from previous day: 08/04 0701 - 08/05 0700 In: 400 [I.V.:400] Out: 2 [Stool:1; Blood:1] Intake/Output this shift: No intake/output data recorded.  Lab Results: Recent Labs    11/07/17 0445 11/08/17 0647 11/09/17 0614  WBC 8.3 7.1 7.5  HGB 11.2* 10.6* 11.1*  HCT 34.0* 32.2* 33.7*  PLT 176 179 191   BMET Recent Labs    11/07/17 0445 11/08/17 0647 11/09/17 0614  NA 136 135 136  K 3.5 3.3* 3.8  CL 103 102 102  CO2 24 22 25   GLUCOSE 124* 79 169*  BUN 8 10 11   CREATININE 1.08 1.15 0.99  CALCIUM 7.8* 7.8* 8.0*   LFT Recent Labs    11/06/17 1659  11/09/17 0614  PROT 7.9   < > 6.5  ALBUMIN 4.4   < > 3.1*  AST 587*   < > 60*  ALT 387*   < > 187*  ALKPHOS 145*   < > 110  BILITOT 3.2*   < > 1.9*  BILIDIR 1.6*  --   --   IBILI 1.6*  --   --    < > = values in this interval not displayed.   PT/INR No results for input(s): LABPROT, INR in the last 72 hours. Hepatitis Panel No results for input(s): HEPBSAG, HCVAB, HEPAIGM, HEPBIGM in the last 72 hours.  Dg Ercp Biliary & Pancreatic Ducts  Result Date: 11/08/2017 CLINICAL DATA:  57 year old male  undergoing ERCP for suspected choledocholithiasis. EXAM: ERCP TECHNIQUE: Multiple spot images obtained with the fluoroscopic device and submitted for interpretation post-procedure. FLUOROSCOPY TIME:  Fluoroscopy Time:  1 minutes 53 seconds COMPARISON:  None. FINDINGS: A total of 7 intraoperative spot films are submitted for review. The images demonstrate a flexible endoscope in the descending duodenum with wire cannulation of first the pancreatic duct, and then the common bile duct. Subsequent cholangiogram demonstrates no evidence of biliary stenosis or dilatation. There is a questionable filling defect in the distal common duct which may represent choledocholithiasis. Subsequent images demonstrate balloon sweep of the common duct and placement of a plastic biliary stent. IMPRESSION: ERCP as above. These images were submitted for radiologic interpretation only. Please see the procedural report for the amount of contrast and the fluoroscopy time utilized. Electronically Signed   By: Jacqulynn Cadet M.D.   On: 11/08/2017 12:41     Principal Problem:   Choledocholithiasis Active Problems:   OSA (obstructive sleep apnea)   Chronic systolic CHF (congestive heart failure) ([place])     LOS: 3 days   Tye Savoy ,NP 11/09/2017, 11:14 AM

## 2017-11-09 NOTE — Discharge Summary (Signed)
Physician Discharge Summary  Nicholas Gray NID:[id number] DOB: [birth year] DOA: 11/06/2017  PCP: [facility]  Admit date: 11/06/2017 Discharge date: 11/09/2017  Time spent: 35 minutes  Recommendations for Outpatient Follow-up:  1. Needs outpatient GI visit 2. 7 days of Levaquin prescribed on discharge 3. Needs removal of plastic stent  Discharge Diagnoses:  Principal Problem:   Choledocholithiasis Active Problems:   OSA (obstructive sleep apnea)   Chronic systolic CHF (congestive heart failure) (HCC)   Discharge Condition: Improved  Diet recommendation: Heart healthy low-salt  Filed Weights   11/06/17 2015 11/08/17 1027 11/09/17 0642  Weight: 116.2 kg (256 lb 2.8 oz) 114.3 kg (252 lb) 115.1 kg (253 lb 12 oz)    History of present illness:  57 year old male chronic systolic heart failure-last EF 08/27/2017 25-30%, NICM with normal coronaries 2010 LBB followed by Dr. Algernon Huxley of heart failure team BMI 36, OSA on CPAP Recent admission 09/07/2017 gangrenous gallbladder status post cholecystectomy with bile leak 09/09/2017 ERCP done 09/11/2017 with failed stent placement did a PERC drain Re-presented 11/06/2017 severe epigastric pain CT abdomen pelvis CBD calcification +?  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   Procedures:  ERCP  Consultations:  GI  Discharge Exam: Vitals:   11/08/17 2115 11/09/17 0411  BP:  140/77  Pulse: 65 (!) 56  Resp: 18 18  Temp:  (!) 97.5 F (36.4 C)  SpO2: 96% 99%    General: Awake alert pleasant no distress no icterus no pallor no  submandibular lymphadenopathy no thyromegaly Cardiovascular: S1-S2 no murmur rub or gallop Respiratory: Clinically clear no added sound Abdomen soft nontender nondistended no rebound clarity neurologically intact Psychiatric euthymic  Discharge Instructions   Discharge Instructions    Diet - low sodium heart healthy   Complete by:  As directed    Discharge after consulting MD assesses patient   Complete by:  Nov 09, 2017    Discharge instructions   Complete by:  As directed    Follow with GI doc Follow with heart failure team Please get labs 1-2 weeks Please eat a diet as per GI md Best of luck   Increase activity slowly   Complete by:  As directed      Allergies as of 11/09/2017   No Known Allergies     Medication List    TAKE these medications   allopurinol 100 MG tablet Commonly known as:  ZYLOPRIM Take 100 mg by mouth 2 (two) times daily.   carvedilol 25 MG tablet Commonly known as:  COREG TAKE (1) TABLET BY MOUTH TWICE DAILY WITH A MEAL.   furosemide 20 MG tablet Commonly known as:  LASIX Take 1 tablet (20 mg total) by mouth daily.   levofloxacin 500 MG tablet Commonly known as:  LEVAQUIN Take 1 tablet (500 mg total) by mouth daily for 10 days.   pantoprazole 40 MG tablet Commonly known as:  PROTONIX Take 1 tablet (40 mg total) by mouth 2 (two) times daily before a meal for 14 days.   potassium chloride SA 20 MEQ tablet Commonly known as:  K-DUR,KLOR-CON Take 2 tablets (40 mEq  total) by mouth daily. What changed:    how much to take  when to take this   ranitidine 150 MG tablet Commonly known as:  ZANTAC Take 150 mg by mouth daily.   sacubitril-valsartan 24-26 MG Commonly known as:  ENTRESTO Take 1 tablet by mouth 2 (two) times daily.   simvastatin 40 MG tablet Commonly known as:  ZOCOR Take 40 mg by mouth at bedtime.      No Known Allergies    The results of significant diagnostics from this hospitalization (including imaging,  microbiology, ancillary and laboratory) are listed below for reference.    Significant Diagnostic Studies: Dg Chest 2 View  Result Date: 11/06/2017 CLINICAL DATA:  Chest pressure and pain beginning this morning, history CHF, hypertension EXAM: CHEST - 2 VIEW COMPARISON:  09/03/2017 FINDINGS: Enlargement of cardiac silhouette. Mediastinal contours and pulmonary vascularity normal. Bibasilar atelectasis. Lungs otherwise clear. No pulmonary infiltrate, pleural effusion or pneumothorax. Bones unremarkable. IMPRESSION: Mild enlargement of cardiac silhouette with bibasilar atelectasis. Electronically Signed   By: Lavonia Dana M.D.   On: 11/06/2017 12:28   Ct Abdomen Pelvis W Contrast  Result Date: 11/06/2017 CLINICAL DATA:  Acute abdominal pain, RIGHT upper quadrant pain beginning this morning, recent cholecystectomy, history dilated cardiomyopathy, CHF, essential hypertension, former smoker EXAM: CT ABDOMEN AND PELVIS WITH CONTRAST TECHNIQUE: Multidetector CT imaging of the abdomen and pelvis was performed using the standard protocol following bolus administration of intravenous contrast. Sagittal and coronal MPR images reconstructed from axial data set. CONTRAST:  153mL OMNIPAQUE IOHEXOL 300 MG/ML SOLN IV. No oral contrast. COMPARISON:  09/12/2017 FINDINGS: Lower chest: Subsegmental atelectasis RIGHT base Hepatobiliary: Post cholecystectomy. Small fluid collection at gallbladder fossa, could be a sterile or infected postoperative collection measuring 3.2 x 2.0 x 2.6 cm. Liver otherwise normal appearance. No biliary dilatation identified. However, a 3 mm diameter calcification is seen in the distal CBD within the pancreatic head consistent with choledocholithiasis. On the prior study a calcification of similar size with seen within the cystic duct stump. Pancreas: Normal appearance Spleen: Normal appearance Adrenals/Urinary Tract: Adrenal glands, kidneys, ureters, and bladder normal appearance Stomach/Bowel:  Nonvisualization of appendix but no pericecal inflammatory process identified. Stomach and bowel loops otherwise normal appearance. Vascular/Lymphatic: Aorta normal caliber.  No adenopathy. Reproductive: Unremarkable Other: BILATERAL inguinal hernias containing fat. No free air or free fluid. Tiny umbilical hernia containing fat. Musculoskeletal: Unremarkable IMPRESSION: Post cholecystectomy with a small postoperative fluid collection at the gallbladder fossa measuring 3.2 x 2.0 x 2.6 cm in size, could be sterile or infected. 3 mm distal CBD calcification consistent with choledocholithiasis, newly located at this position since the previous exam; no biliary dilatation identified. Electronically Signed   By: Lavonia Dana M.D.   On: 11/06/2017 17:37   Dg Ercp Biliary & Pancreatic Ducts  Result Date: 11/08/2017 CLINICAL DATA:  57 year old male undergoing ERCP for suspected choledocholithiasis. EXAM: ERCP TECHNIQUE: Multiple spot images obtained with the fluoroscopic device and submitted for interpretation post-procedure. FLUOROSCOPY TIME:  Fluoroscopy Time:  1 minutes 53 seconds COMPARISON:  None. FINDINGS: A total of 7 intraoperative spot films are submitted for review. The images demonstrate a flexible endoscope in the descending duodenum with wire cannulation of first the pancreatic duct, and then the common bile duct. Subsequent cholangiogram demonstrates no evidence of biliary stenosis or dilatation. There is a questionable filling defect in the distal common duct which may represent choledocholithiasis. Subsequent images demonstrate balloon sweep of the common duct and placement of a plastic biliary stent. IMPRESSION:  ERCP as above. These images were submitted for radiologic interpretation only. Please see the procedural report for the amount of contrast and the fluoroscopy time utilized. Electronically Signed   By: Jacqulynn Cadet M.D.   On: 11/08/2017 12:41    Microbiology: No results found for this or  any previous visit (from the past 240 hour(s)).   Labs: Basic Metabolic Panel: Recent Labs  Lab 11/06/17 1145 11/07/17 0445 11/07/17 1730 11/08/17 0647 11/09/17 0614  NA 137 136  --  135 136  K 3.7 3.5  --  3.3* 3.8  CL 102 103  --  102 102  CO2 24 24  --  22 25  GLUCOSE 134* 124*  --  79 169*  BUN 13 8  --  10 11  CREATININE 1.04 1.08  --  1.15 0.99  CALCIUM 8.6* 7.8*  --  7.8* 8.0*  MG  --   --  1.3*  --   --    Liver Function Tests: Recent Labs  Lab 11/06/17 1659 11/07/17 0445 11/08/17 0647 11/09/17 0614  AST 587* 336* 135* 60*  ALT 387* 386* 267* 187*  ALKPHOS 145* 124 123 110  BILITOT 3.2* 4.9* 4.6* 1.9*  PROT 7.9 6.2* 6.2* 6.5  ALBUMIN 4.4 3.2* 3.2* 3.1*   Recent Labs  Lab 11/06/17 1659 11/07/17 0445  LIPASE 47 33   No results for input(s): AMMONIA in the last 168 hours. CBC: Recent Labs  Lab 11/06/17 1145 11/07/17 0445 11/08/17 0647 11/09/17 0614  WBC 6.1 8.3 7.1 7.5  NEUTROABS  --  7.3 5.4 6.2  HGB 10.7* 11.2* 10.6* 11.1*  HCT 33.0* 34.0* 32.2* 33.7*  MCV 95.4 93.4 93.9 92.8  PLT 197 176 179 191   Cardiac Enzymes: No results for input(s): CKTOTAL, CKMB, CKMBINDEX, TROPONINI in the last 168 hours. BNP: BNP (last 3 results) Recent Labs    09/03/17 1115  BNP 7.1    ProBNP (last 3 results) No results for input(s): PROBNP in the last 8760 hours.  CBG: No results for input(s): GLUCAP in the last 168 hours.     Signed:  Nita Sells MD   [facility] 11/09/2017, 1:33 PM

## 2017-11-10 ENCOUNTER — Telehealth: Payer: Self-pay | Admitting: Gastroenterology

## 2017-11-10 ENCOUNTER — Other Ambulatory Visit: Payer: Self-pay

## 2017-11-10 DIAGNOSIS — R7989 Other specified abnormal findings of blood chemistry: Secondary | ICD-10-CM

## 2017-11-10 DIAGNOSIS — R945 Abnormal results of liver function studies: Principal | ICD-10-CM

## 2017-11-10 NOTE — Telephone Encounter (Signed)
Patient called back stating that he needs a letter stating that he can return to work.

## 2017-11-10 NOTE — Telephone Encounter (Signed)
I spoke with Dr Lyndel Safe and he authorized my writing the RTW letter.  I called the patient to let him know that the letter was ready to be picked up at the front desk.

## 2017-11-16 DIAGNOSIS — K8051 Calculus of bile duct without cholangitis or cholecystitis with obstruction: Secondary | ICD-10-CM | POA: Diagnosis not present

## 2017-11-23 ENCOUNTER — Other Ambulatory Visit (INDEPENDENT_AMBULATORY_CARE_PROVIDER_SITE_OTHER): Payer: 59

## 2017-11-23 DIAGNOSIS — R945 Abnormal results of liver function studies: Secondary | ICD-10-CM

## 2017-11-23 DIAGNOSIS — R7989 Other specified abnormal findings of blood chemistry: Secondary | ICD-10-CM

## 2017-11-23 LAB — COMPREHENSIVE METABOLIC PANEL
ALBUMIN: 4.3 g/dL (ref 3.5–5.2)
ALT: 32 U/L (ref 0–53)
AST: 20 U/L (ref 0–37)
Alkaline Phosphatase: 76 U/L (ref 39–117)
BILIRUBIN TOTAL: 0.6 mg/dL (ref 0.2–1.2)
BUN: 18 mg/dL (ref 6–23)
CO2: 21 mEq/L (ref 19–32)
Calcium: 9.2 mg/dL (ref 8.4–10.5)
Chloride: 101 mEq/L (ref 96–112)
Creatinine, Ser: 1.14 mg/dL (ref 0.40–1.50)
GFR: 70 mL/min (ref >60.00)
Glucose, Bld: 129 mg/dL — ABNORMAL HIGH (ref 70–99)
Potassium: 3.9 mEq/L (ref 3.5–5.1)
SODIUM: 133 meq/L — AB (ref 135–145)
TOTAL PROTEIN: 7.2 g/dL (ref 6.0–8.3)

## 2017-11-23 LAB — CBC WITH DIFFERENTIAL/PLATELET
Basophils Absolute: 0 10*3/uL (ref 0.0–0.1)
Basophils Relative: 0.6 % (ref 0.0–3.0)
Eosinophils Absolute: 0.1 10*3/uL (ref 0.0–0.7)
Eosinophils Relative: 1.6 % (ref 0.0–5.0)
HCT: 35.4 % — ABNORMAL LOW (ref 39.0–52.0)
HEMOGLOBIN: 11.7 g/dL — AB (ref 13.0–17.0)
Lymphocytes Relative: 21.6 % (ref 12.0–46.0)
Lymphs Abs: 1.4 10*3/uL (ref 0.7–4.0)
MCHC: 33.2 g/dL (ref 30.0–36.0)
MCV: 92.6 fl (ref 78.0–100.0)
MONO ABS: 0.6 10*3/uL (ref 0.1–1.0)
Monocytes Relative: 9.3 % (ref 3.0–12.0)
Neutro Abs: 4.5 10*3/uL (ref 1.4–7.7)
Neutrophils Relative %: 66.9 % (ref 43.0–77.0)
Platelets: 279 10*3/uL (ref 150.0–400.0)
RBC: 3.82 Mil/uL — AB (ref 4.22–5.81)
RDW: 13.9 % (ref 11.5–15.5)
WBC: 6.7 10*3/uL (ref 4.0–10.5)

## 2017-11-23 LAB — LIPASE: Lipase: 41 U/L (ref 11.0–59.0)

## 2017-11-25 ENCOUNTER — Other Ambulatory Visit: Payer: Self-pay

## 2017-11-25 DIAGNOSIS — K8050 Calculus of bile duct without cholangitis or cholecystitis without obstruction: Secondary | ICD-10-CM

## 2017-11-30 ENCOUNTER — Encounter (HOSPITAL_COMMUNITY): Payer: Self-pay | Admitting: Cardiology

## 2017-11-30 ENCOUNTER — Ambulatory Visit (HOSPITAL_COMMUNITY)
Admission: RE | Admit: 2017-11-30 | Discharge: 2017-11-30 | Disposition: A | Discharge status: Home / Self Care | Payer: 59 | Source: Ambulatory Visit | Attending: Cardiology | Admitting: Cardiology

## 2017-11-30 VITALS — BP 120/72 | HR 60 | Wt 258.4 lb

## 2017-11-30 DIAGNOSIS — I110 Hypertensive heart disease with heart failure: Secondary | ICD-10-CM | POA: Insufficient documentation

## 2017-11-30 DIAGNOSIS — Z6837 Body mass index (BMI) 37.0-37.9, adult: Secondary | ICD-10-CM | POA: Diagnosis not present

## 2017-11-30 DIAGNOSIS — Z87891 Personal history of nicotine dependence: Secondary | ICD-10-CM | POA: Diagnosis not present

## 2017-11-30 DIAGNOSIS — K219 Gastro-esophageal reflux disease without esophagitis: Secondary | ICD-10-CM | POA: Insufficient documentation

## 2017-11-30 DIAGNOSIS — Z79899 Other long term (current) drug therapy: Secondary | ICD-10-CM | POA: Diagnosis not present

## 2017-11-30 DIAGNOSIS — I428 Other cardiomyopathies: Secondary | ICD-10-CM | POA: Diagnosis not present

## 2017-11-30 DIAGNOSIS — G4733 Obstructive sleep apnea (adult) (pediatric): Secondary | ICD-10-CM | POA: Insufficient documentation

## 2017-11-30 DIAGNOSIS — E669 Obesity, unspecified: Secondary | ICD-10-CM | POA: Diagnosis not present

## 2017-11-30 DIAGNOSIS — I447 Left bundle-branch block, unspecified: Secondary | ICD-10-CM

## 2017-11-30 DIAGNOSIS — I5022 Chronic systolic (congestive) heart failure: Secondary | ICD-10-CM | POA: Diagnosis not present

## 2017-11-30 LAB — BASIC METABOLIC PANEL
ANION GAP: 9 (ref 5–15)
BUN: 9 mg/dL (ref 6–20)
CO2: 27 mmol/L (ref 22–32)
Calcium: 8.9 mg/dL (ref 8.9–10.3)
Chloride: 102 mmol/L (ref 98–111)
Creatinine, Ser: 1.04 mg/dL (ref 0.61–1.24)
GFR calc Af Amer: >60 mL/min (ref >60)
GLUCOSE: 88 mg/dL (ref 70–99)
POTASSIUM: 4.3 mmol/L (ref 3.5–5.1)
Sodium: 138 mmol/L (ref 135–145)

## 2017-11-30 MED ORDER — POTASSIUM CHLORIDE CRYS ER 20 MEQ PO TBCR: 20.0000 meq | EXTENDED_RELEASE_TABLET | Freq: Every day | ORAL | 3 refills | Status: DC

## 2017-11-30 MED ORDER — FUROSEMIDE 20 MG PO TABS: 20.0000 mg | ORAL_TABLET | ORAL | 3 refills | Status: AC | PRN

## 2017-11-30 MED ORDER — SPIRONOLACTONE 25 MG PO TABS: 12.5000 mg | ORAL_TABLET | Freq: Every day | ORAL | 3 refills | Status: DC

## 2017-11-30 NOTE — Patient Instructions (Signed)
Labs today (will call for abnormal results, otherwise no news is good news)  STOP Taking lasix.  You may take as needed for weight gain of 3 lbs overnight or 5 lbs in a week.  DECREASE Potassium to 20 mEq once Daily  START taking Spironolactone 12.5 mg (0.5 Tablet) Once Daily.  Labs in 10 days (Ferritin)  Follow up with our pharmacy clinic in 3 weeks  Follow up with Dr. [name] in 6 weeks.

## 2017-12-01 ENCOUNTER — Ambulatory Visit (INDEPENDENT_AMBULATORY_CARE_PROVIDER_SITE_OTHER): Payer: 59 | Admitting: Gastroenterology

## 2017-12-01 ENCOUNTER — Encounter: Payer: Self-pay | Admitting: Gastroenterology

## 2017-12-01 VITALS — BP 128/70 | HR 64 | Ht 70.0 in | Wt 259.0 lb

## 2017-12-01 DIAGNOSIS — K839 Disease of biliary tract, unspecified: Secondary | ICD-10-CM | POA: Diagnosis not present

## 2017-12-01 DIAGNOSIS — Z86010 Personal history of colonic polyps: Secondary | ICD-10-CM

## 2017-12-01 DIAGNOSIS — I5022 Chronic systolic (congestive) heart failure: Secondary | ICD-10-CM | POA: Diagnosis not present

## 2017-12-01 DIAGNOSIS — K8050 Calculus of bile duct without cholangitis or cholecystitis without obstruction: Secondary | ICD-10-CM

## 2017-12-01 DIAGNOSIS — G4733 Obstructive sleep apnea (adult) (pediatric): Secondary | ICD-10-CM

## 2017-12-01 NOTE — Progress Notes (Signed)
Nicholas Gray  Chief Complaint: Bile leak and bile duct stone  Subjective  History:  Nicholas Gray follows up after recent hospital stay for choledocholithiasis.  I saw him as a hospital consult in June when he was admitted for severe acute cholecystitis resulting in bile leak.  I performed ERCP, but was unable to cannulate the bile duct.  My advice was referral to a tertiary center for repeat attempted ERCP and stent placement.  His surgeon kept his drain in and then discharged him home for outpatient follow-up.  He had a repeat HIDA scan on June 24 showing no leak, so his drain was pulled.  This is all by the patient report, and I found the radiologic report in the computer. He was then readmitted nearly 4 weeks ago with acute right upper quadrant pain elevated LFTs and common bile duct stone seen on CT scan.  There was a small fluid collection of the gallbladder fossa.  He underwent ERCP with Dr. Carol Ada, who was able to cannulate the bile duct, perform a sphincterotomy, removed a biliary stone and confirm a bile leak.  8.5 [place] plastic stent was placed and the patient recovered well.  He is feeling well now, denies abdominal pain fever, nausea vomiting or weight loss.  He also tells me he had a colonoscopy in 2014 where multiple polyps were removed, performed by the [facility] physician in [place].  [name] believes he was due for repeat colonoscopy earlier this year but had not yet been able to get it scheduled when he became sick with cholecystitis this spring.  I found the report and there were 6 polyps removed in January 2014,two of which were tubular adenomas and the remainder hyperplastic polyps.  The report and pathology are on file in his chart.  Nicholas Gray also wanted me to know about his recent cardiology office Gray, where there are plans to perform a cardiac MRI in November of this year with possible plans for pacemaker and defibrillator afterwards.  He has  congestive heart failure with an ejection fraction of 25 to 30% on 08/27/2017 (report reviewed).  ROS: Cardiovascular:  no chest pain Respiratory: mild dyspnea with stairs Arthralgias Remainder of systems negative except as above The patient's Past Medical, Family and Social History were reviewed and are on file in the EMR. Past Medical History:  Diagnosis Date  . Arthritis    gout  . Cardiomyopathy    EF 30-40% (Normal coronaries cath 2010).  Echo 2015 EF 40%  . CHF (congestive heart failure) ([place])   . Colonic polyp   . Diverticulosis of colon   . GERD (gastroesophageal reflux disease)   . History of chicken pox   . HLD (hyperlipidemia)    recent  . HTN (hypertension)    recent  . Sleep apnea    does use CPAP.     I reviewed his most recent cardiology office Gray with Dr.McLean Objective:  Med list reviewed  Current Outpatient Medications:  .  allopurinol (ZYLOPRIM) 100 MG tablet, Take 100 mg by mouth 2 (two) times daily. , Disp: , Rfl:  .  carvedilol (COREG) 25 MG tablet, TAKE (1) TABLET BY MOUTH TWICE DAILY WITH A MEAL., Disp: 60 tablet, Rfl: 11 .  furosemide (LASIX) 20 MG tablet, Take 1 tablet (20 mg total) by mouth as needed for fluid or edema., Disp: 30 tablet, Rfl: 3 .  potassium chloride SA (K-DUR,KLOR-CON) 20 MEQ tablet, Take 1 tablet (20 mEq total) by mouth  daily., Disp: 30 tablet, Rfl: 3 .  ranitidine (ZANTAC) 150 MG tablet, Take 150 mg by mouth daily., Disp: , Rfl:  .  sacubitril-valsartan (ENTRESTO) 24-26 MG, Take 1 tablet by mouth 2 (two) times daily., Disp: 60 tablet, Rfl: 3 .  simvastatin (ZOCOR) 40 MG tablet, Take 40 mg by mouth at bedtime., Disp: , Rfl:  .  spironolactone (ALDACTONE) 25 MG tablet, Take 0.5 tablets (12.5 mg total) by mouth daily., Disp: 45 tablet, Rfl: 3   Vital signs in last 24 hrs: Vitals:   12/01/17 1636  BP: 128/70  Pulse: 64    Physical Exam  He is well-appearing today  HEENT: sclera anicteric, oral mucosa moist without  lesions  Neck: supple, no thyromegaly, JVD or lymphadenopathy  Cardiac: RRR without murmurs, S1S2 heard, no peripheral edema  Pulm: clear to auscultation bilaterally, normal RR and effort noted  Abdomen: soft, no tenderness, with active bowel sounds. No guarding or palpable hepatosplenomegaly.  Skin; warm and dry, no jaundice or rash  Recent Labs:  CMP Latest Ref Rng & Units 11/30/2017 11/23/2017 11/09/2017  Glucose 70 - 99 mg/dL 88 129(H) 169(H)  BUN 6 - 20 mg/dL 9 18 11   Creatinine 0.61 - 1.24 mg/dL 1.04 1.14 0.99  Sodium 135 - 145 mmol/L 138 133(L) 136  Potassium 3.5 - 5.1 mmol/L 4.3 3.9 3.8  Chloride 98 - 111 mmol/L 102 101 102  CO2 22 - 32 mmol/L 27 21 25   Calcium 8.9 - 10.3 mg/dL 8.9 9.2 8.0(L)  Total Protein 6.0 - 8.3 g/dL - 7.2 6.5  Total Bilirubin 0.2 - 1.2 mg/dL - 0.6 1.9(H)  Alkaline Phos 39 - 117 U/L - 76 110  AST 0 - 37 U/L - 20 60(H)  ALT 0 - 53 U/L - 32 187(H)     Radiologic studies: CT abdomen pelvis report from recent admission reviewed   @[email or web address] @ Assessment: Encounter Diagnoses  Name Primary?  . Bile leak Yes  . Choledocholithiasis   . Personal history of colonic polyps   . Chronic systolic CHF (congestive heart failure) ([place])   . OSA (obstructive sleep apnea)    He needs a repeat ERCP for stent removal and confirmation of bile duct resolution.  The biliary access for that procedure should be easier since he has had a sphincterotomy. It must be done under general anesthesia at the hospital endoscopy lab. There are increased risk of cardiovascular complications given his medical conditions, same as before.  He is agreeable after thorough discussion of procedure and risks.  The benefits and risks of the planned procedure were described in detail with the patient or (when appropriate) their health care proxy.  Risks were outlined as including, but not limited to, bleeding, infection, perforation, adverse medication reaction leading to cardiac or  pulmonary decompensation, or pancreatitis (if ERCP).  The limitation of incomplete mucosal visualization was also discussed.  No guarantees or warranties were given.  Regarding his history of colon polyps, he is due for surveillance colonoscopy.  He would like to do it before the end of December since he has met his insurance deductible.  That might be feasible, however I think we should attend to his biliary stone first.  I would also like to know the plan for his possible pacemaker/defibrillator after his further testing. His colonoscopy would also need to be done in the hospital endoscopy lab due to his congestive heart failure.    Total time 40 minutes, over half spent face-to-face with patient in counseling and coordination  of care.   Nelida Meuse III

## 2017-12-01 NOTE — Progress Notes (Signed)
Advanced Heart Failure Clinic Note   PCP: [facility] PCP-Cardiologist: Minus Breeding, MD  HF Cardiology: Dr. [name]  HPI:  Nicholas Gray is a 57 y.o. male with chronic systolic CHF, NICM (Normal coronaries 2010), HTN, LBBB, Obesity, and OSA on CPAP.  He has been known to have a cardiomyopathy for the last 10 years or so.   Seen in [facility] office 08/13/17 and was overall feeling well. No new symptoms.  Echo 08/27/17 LVEF 25-30%, Grade 1 DD, Mild MR, Mild LAE, mild RV dilation, PA peak pressure 34 mm Hg.  Referred by Dr. Percival Spanish to CHF team for further evaluation and treatment of CHF.    Pt admitted 5/30 - 09/13/2017 with Chest/upper abdominal discomfort and pain. CT scan and HIDA consistent with acute cholecystitis. Underwent laproscopic chole on 09/07/17 and found to have gangrenous GB. 09/09/17 repeat HIDA scan showed bile leak. ERCP 09/11/17, but patient had failed stent placement. Considered for IR for drain placement, but decided on conservative management.  He was admitted again with RUQ pain in 7/19, had ERCP with bile leak and choledocholithiasis noted.  He had stone removal and stent placed.   LHC/RHC in 7/19 showed no significant CAD, optimized filling pressures and preserved cardiac output.   He returns today for followup of CHF.  RUQ pain has resolved since recent ERCP/sphincterotomy/stone removal.  No dyspnea walking on flat ground.  Mild dyspnea with stairs.  No orthopnea/PND. No chest pain.  No lightheadedness.   Labs (6/19): K 4.2, creatinine 1.14 Labs (8/19): K 3.9, creatinine 1.14, normal LFTs  ECG: NSR, LBBB 166 msec  Social History: Lives in [place], [place]. 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 Denies heavy ETOH use.  Family history: He denies immediate family history of CHF, but thinks his maternal grandparents may have had "heart problems"   Review of systems complete and found to be negative unless listed in HPI.    Past  Medical History: 1. Chronic systolic CHF: Suspected nonischemic cardiomyopathy (normal coronaries 2010).  - Echo 2012 with EF 30% - Echo 12/15 with EF 40-45% - Echo 2017 with EF 35-40% - Echo 08/27/17 LVEF 25-30%, Grade 1 DD, Mild MR, Mild LAE, mild RV dilation, PA peak pressure 34 mm Hg.  - LHC/RHC (7/19): No significant coronary disease. RA 7, PA 39/12 mean 25, PCWP mean 11, CI 3.68, PVR 1.65 WU.  2. HTN 3. LBBB, chronic - QRS 166 on EKG 09/04/17 4. Obesity Body mass index is 37.08 kg/m. 5. OSA: Reports compliance with his CPAP.  6. GERD 7. Cholecystitis: Underwent laproscopic chole on 09/07/17 and found to have gangrenous GB.  - 09/09/17 repeat HIDA scan showed bile leak.  - ERCP 09/11/17, but patient had failed stent placement. - ERCP 7/19 with sphincterotomy, stone removal, stent placement.   Current Outpatient Medications  Medication Sig Dispense Refill  . allopurinol (ZYLOPRIM) 100 MG tablet Take 100 mg by mouth 2 (two) times daily.     . carvedilol (COREG) 25 MG tablet TAKE (1) TABLET BY MOUTH TWICE DAILY WITH A MEAL. 60 tablet 11  . furosemide (LASIX) 20 MG tablet Take 1 tablet (20 mg total) by mouth as needed for fluid or edema. 30 tablet 3  . potassium chloride SA (K-DUR,KLOR-CON) 20 MEQ tablet Take 1 tablet (20 mEq total) by mouth daily. 30 tablet 3  . ranitidine (ZANTAC) 150 MG tablet Take 150 mg by mouth daily.    . sacubitril-valsartan (ENTRESTO)  24-26 MG Take 1 tablet by mouth 2 (two) times daily. 60 tablet 3  . simvastatin (ZOCOR) 40 MG tablet Take 40 mg by mouth at bedtime.    Marland Kitchen spironolactone (ALDACTONE) 25 MG tablet Take 0.5 tablets (12.5 mg total) by mouth daily. 45 tablet 3   No current facility-administered medications for this encounter.    No Known Allergies  Vitals:   11/30/17 1112  BP: 120/72  Pulse: 60  SpO2: 97%  Weight: 117.2 kg (258 lb 6.4 oz)   Wt Readings from Last 3 Encounters:  11/30/17 117.2 kg (258 lb 6.4 oz)  11/09/17 115.1 kg (253 lb 12 oz)    10/12/17 113.9 kg (251 lb)    PHYSICAL EXAM: General: NAD Neck: Thick, no JVD, no thyromegaly or thyroid nodule.  Lungs: Clear to auscultation bilaterally with normal respiratory effort. CV: Nondisplaced PMI.  Heart regular S1/S2, no S3/S4, no murmur.  No peripheral edema.  No carotid bruit.  Normal pedal pulses.  Abdomen: Soft, nontender, no hepatosplenomegaly, no distention.  Skin: Intact without lesions or rashes.  Neurologic: Alert and oriented x 3.  Psych: Normal affect. Extremities: No clubbing or cyanosis.  HEENT: Normal.   ASSESSMENT & PLAN:  1. Chronic systolic CHF: Nonischemic cardiomyopathy by coronary angiography in 2010 and 7/19.  He has had a cardiomyopathy for at least the last 10 years.  Recent echo in 5/19 showed a fall in EF to the 25-30% range.  He does not have an ICD as prior echoes had shown EF in the 40% range.  It is possible that he has a LBBB cardiomyopathy.  Prior viral myocarditis is also a possibility. No significant FH of CHF and no heavy ETOH/substance abuse.  NYHA class II symptoms, filling pressures/cardiac output optimized on recent RHC.  He is not volume overloaded on exam.  - Check Fe studies to rule out hemochromatosis.  - Will titrate CHF meds until 11/19.  At that time, will arrange for cardiac MRI, both to assess for infiltrative disease and to assess EF for BiV- ICD placement.  He has a wide LBBB, and if EF remains low, BiV pacing may be very helpful.  - Continue current Entresto and Coreg.  - He can stop Lasix today and will decrease KCl to 20 mEeq daily. I will add spironolactone 12.5 mg daily.  BMET in 10 days.  2. HTN: BP controlled.  3. LBBB: Chronic. As above, if EF remains low with aggressive medical management (cardiac MRI in 11/19), will arrange for BiV pacing.  4. Obesity: Body mass index is 37.08 kg/m.  - Encouraged weight loss 5. OSA: Encouraged nightly CPAP.   Followup in 3 wks in pharmacy clinic for medication titration, then see me  in 6 wks.   [name] 12/01/2017

## 2017-12-01 NOTE — Patient Instructions (Addendum)
If you are age 58 or older, your body mass index should be between 23-30. Your Body mass index is 37.16 kg/m. If this is out of the aforementioned range listed, please consider follow up with your Primary Care Provider.  If you are age 93 or younger, your body mass index should be between 19-25. Your Body mass index is 37.16 kg/m. If this is out of the aformentioned range listed, please consider follow up with your Primary Care Provider.   You have been scheduled for a ERCP. Please follow written instructions given to you at your visit today. If you use inhalers (even only as needed), please bring them with you on the day of your procedure.  You will be due for a recall colonoscopy in 02-2018. We will send you a reminder in the mail when it gets closer to that time.  It was a pleasure to see you today!  Dr. Loletha Carrow

## 2017-12-09 DIAGNOSIS — G4733 Obstructive sleep apnea (adult) (pediatric): Secondary | ICD-10-CM | POA: Diagnosis not present

## 2017-12-10 ENCOUNTER — Ambulatory Visit (HOSPITAL_COMMUNITY)
Admission: RE | Admit: 2017-12-10 | Discharge: 2017-12-10 | Disposition: A | Discharge status: Home / Self Care | Payer: 59 | Source: Ambulatory Visit | Attending: Cardiology | Admitting: Cardiology

## 2017-12-10 DIAGNOSIS — I5022 Chronic systolic (congestive) heart failure: Secondary | ICD-10-CM | POA: Diagnosis not present

## 2017-12-10 LAB — FERRITIN: FERRITIN: 191 ng/mL (ref 24–336)

## 2017-12-18 NOTE — Progress Notes (Signed)
HF MD: [facility]  HPI:  Nicholas Gray is a 57 y.o. male with chronic systolic CHF, NICM (Normal coronaries 2010), HTN, LBBB, Obesity, and OSA on CPAP.  He has been known to have a cardiomyopathy for the last 10 years or so.   Seen in [facility] office 08/13/17 and was overall feeling well. No new symptoms.  Echo 08/27/17 LVEF 25-30%, Grade 1 DD, Mild MR, Mild LAE, mild RV dilation, PA peak pressure 34 mm Hg.  Referred by Dr. Percival Spanish to CHF team for further evaluation and treatment of CHF.    Pt admitted 5/30 - 09/13/2017 with Chest/upper abdominal discomfort and pain. CT scan and HIDA consistent with acute cholecystitis. Underwent laproscopic chole on 09/07/17 and found to have gangrenous GB. 09/09/17 repeat HIDA scan showed bile leak. ERCP 09/11/17, but patient had failed stent placement. Considered for IR for drain placement, but decided on conservative management.  He was admitted again with RUQ pain in 7/19, had ERCP with bile leak and choledocholithiasis noted.  He had stone removal and stent placed.   LHC/RHC in 7/19 showed no significant CAD, optimized filling pressures and preserved cardiac output.   He returns today for pharmacist-led HF medication titration. At last HF clinic visit on 8/26, his Lasix was d/c'd, KCl was decreased to 20 meq daily and spironolactone 12.5 mg daily was added. 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     . Shortness of breath/dyspnea on exertion? no  . Orthopnea/PND? no . Edema? no . Lightheadedness/dizziness? no . Daily weights at home? Yes - stable ~250 lb . Blood pressure/heart rate monitoring at home? No - has BP monitor but doesn't use it . Following low-sodium/fluid-restricted diet? Yes  HF Medications: Carvedilol 25 mg PO BID Furosemide 20 mg PO daily PRN fluid/edema - none since starting spironolactone (usually edema in LE)) KCl 20 mEq PO daily Entresto 24-26 mg PO BID Spironolactone 12.5 mg PO  daily   Has the patient been experiencing any side effects to the medications prescribed?  no  Does the patient have any problems obtaining medications due to transportation or finances?   No - [employer] commercial   Understanding of regimen: good Understanding of indications: good Potential of compliance: good Patient understands to avoid NSAIDs. Patient understands to avoid decongestants.    Pertinent Lab Values: . 12/22/17: Serum creatinine 1.09, BUN 15, Potassium 3.8, Sodium 135  Vital Signs: . Weight: 257 lb (last HF clinic visit weight: 259 lb) . Blood pressure: 134/70 mmHg  . Heart rate: 53 bpm   Assessment: 1. Chronic systolic CHF (EF 81-82%), due to NICM. NYHA class II symptoms.   - Volume status stable  - Increase Entresto to 49-51 mg BID   - Continue carvedilol at goal 25 mg BID, furosemide 20 mg PRN LE edema, KCl 20 mEq daily and spironolactone 12.5 mg daily  - Basic disease state pathophysiology, medication indication, mechanism and side effects reviewed at length with patient and he verbalized understanding 2. HTN:   - BP mostly controlled (a little above goal <130/80 mmHg)  - Increase Entresto as above  3. LBBB: Chronic. As above, if EF remains low with aggressive medical management (cardiac MRI in 11/19), will arrange for BiV pacing.  4. Obesity: Body mass index is 37.08 kg/m.  - Encouraged weight loss 5. OSA: Encouraged nightly CPAP.     Plan: 1) Medication changes: Based on clinical presentation, vital signs and  recent labs will increase Entresto to 49-51 mg PO BID 2) Labs: BMET today and in 2 weeks 3) Follow-up: Dr. [name] on 01/18/18   Ruta Hinds. Velva Harman, PharmD, BCPS, CPP Clinical Pharmacist Phone: [phone] 12/18/2017 3:42 PM

## 2017-12-22 ENCOUNTER — Ambulatory Visit (HOSPITAL_COMMUNITY)
Admission: RE | Admit: 2017-12-22 | Discharge: 2017-12-22 | Disposition: A | Discharge status: Home / Self Care | Payer: 59 | Source: Ambulatory Visit | Attending: Internal Medicine | Admitting: Internal Medicine

## 2017-12-22 VITALS — BP 134/70 | HR 53 | Wt 257.0 lb

## 2017-12-22 DIAGNOSIS — I110 Hypertensive heart disease with heart failure: Secondary | ICD-10-CM | POA: Insufficient documentation

## 2017-12-22 DIAGNOSIS — I5022 Chronic systolic (congestive) heart failure: Secondary | ICD-10-CM | POA: Insufficient documentation

## 2017-12-22 DIAGNOSIS — I429 Cardiomyopathy, unspecified: Secondary | ICD-10-CM | POA: Diagnosis not present

## 2017-12-22 DIAGNOSIS — G4733 Obstructive sleep apnea (adult) (pediatric): Secondary | ICD-10-CM | POA: Insufficient documentation

## 2017-12-22 DIAGNOSIS — Z6837 Body mass index (BMI) 37.0-37.9, adult: Secondary | ICD-10-CM | POA: Diagnosis not present

## 2017-12-22 DIAGNOSIS — E669 Obesity, unspecified: Secondary | ICD-10-CM | POA: Insufficient documentation

## 2017-12-22 DIAGNOSIS — I447 Left bundle-branch block, unspecified: Secondary | ICD-10-CM | POA: Insufficient documentation

## 2017-12-22 LAB — BASIC METABOLIC PANEL
Anion gap: 9 (ref 5–15)
BUN: 15 mg/dL (ref 6–20)
CO2: 22 mmol/L (ref 22–32)
Calcium: 8.7 mg/dL — ABNORMAL LOW (ref 8.9–10.3)
Chloride: 104 mmol/L (ref 98–111)
Creatinine, Ser: 1.09 mg/dL (ref 0.61–1.24)
GFR calc non Af Amer: >60 mL/min (ref >60)
Glucose, Bld: 124 mg/dL — ABNORMAL HIGH (ref 70–99)
POTASSIUM: 3.8 mmol/L (ref 3.5–5.1)
SODIUM: 135 mmol/L (ref 135–145)

## 2017-12-22 MED ORDER — SACUBITRIL-VALSARTAN 49-51 MG PO TABS: 1.0000 | ORAL_TABLET | Freq: Two times a day (BID) | ORAL | 5 refills | Status: DC

## 2017-12-22 NOTE — Patient Instructions (Addendum)
It was great to meet you today!  Please INCREASE your Entresto to 49-51 mg TWICE DAILY.   Blood work today. We will call you with any changes.   You are scheduled for blood work again on 01/05/18 at 8:30 am.   Please keep your appointment with Dr. [name] on 01/18/18.

## 2017-12-23 ENCOUNTER — Other Ambulatory Visit: Payer: Self-pay | Admitting: Cardiology

## 2018-01-05 ENCOUNTER — Ambulatory Visit (HOSPITAL_COMMUNITY)
Admission: RE | Admit: 2018-01-05 | Discharge: 2018-01-05 | Disposition: A | Discharge status: Home / Self Care | Payer: 59 | Source: Ambulatory Visit | Attending: Cardiology | Admitting: Cardiology

## 2018-01-05 DIAGNOSIS — I5022 Chronic systolic (congestive) heart failure: Secondary | ICD-10-CM | POA: Insufficient documentation

## 2018-01-05 LAB — BASIC METABOLIC PANEL
ANION GAP: 7 (ref 5–15)
BUN: 21 mg/dL — ABNORMAL HIGH (ref 6–20)
CHLORIDE: 106 mmol/L (ref 98–111)
CO2: 23 mmol/L (ref 22–32)
Calcium: 9.1 mg/dL (ref 8.9–10.3)
Creatinine, Ser: 1.23 mg/dL (ref 0.61–1.24)
GFR calc Af Amer: >60 mL/min (ref >60)
GFR calc non Af Amer: >60 mL/min (ref >60)
Glucose, Bld: 111 mg/dL — ABNORMAL HIGH (ref 70–99)
POTASSIUM: 4.5 mmol/L (ref 3.5–5.1)
Sodium: 136 mmol/L (ref 135–145)

## 2018-01-07 ENCOUNTER — Encounter (HOSPITAL_COMMUNITY): Payer: Self-pay | Admitting: *Deleted

## 2018-01-08 ENCOUNTER — Other Ambulatory Visit: Payer: Self-pay

## 2018-01-10 NOTE — Anesthesia Preprocedure Evaluation (Addendum)
Anesthesia Evaluation  Patient identified by MRN, date of birth, ID band Patient awake    Reviewed: Allergy & Precautions, NPO status , Patient's Chart, lab work & pertinent test results  Airway Mallampati: II  TM Distance: >3 FB     Dental   Pulmonary sleep apnea , former smoker,    breath sounds clear to auscultation       Cardiovascular hypertension, +CHF  + dysrhythmias  Rhythm:Regular     Neuro/Psych    GI/Hepatic GERD  ,  Endo/Other    Renal/GU negative Renal ROS     Musculoskeletal   Abdominal   Peds  Hematology   Anesthesia Other Findings   Reproductive/Obstetrics                            Anesthesia Physical Anesthesia Plan  ASA: III  Anesthesia Plan: General   Post-op Pain Management:    Induction: Intravenous  PONV Risk Score and Plan: Ondansetron, Dexamethasone and Midazolam  Airway Management Planned: Oral ETT  Additional Equipment:   Intra-op Plan:   Post-operative Plan: Extubation in OR  Informed Consent:   Dental advisory given  Plan Discussed with: CRNA and Anesthesiologist  Anesthesia Plan Comments:        Anesthesia Quick Evaluation

## 2018-01-11 ENCOUNTER — Ambulatory Visit (HOSPITAL_COMMUNITY)
Admission: RE | Admit: 2018-01-11 | Discharge: 2018-01-11 | Disposition: A | Discharge status: Home / Self Care | Payer: 59 | Source: Ambulatory Visit | Attending: Gastroenterology | Admitting: Gastroenterology

## 2018-01-11 ENCOUNTER — Encounter (HOSPITAL_COMMUNITY): Payer: Self-pay | Admitting: *Deleted

## 2018-01-11 ENCOUNTER — Ambulatory Visit (HOSPITAL_COMMUNITY): Payer: 59

## 2018-01-11 ENCOUNTER — Ambulatory Visit (HOSPITAL_COMMUNITY): Payer: 59 | Admitting: Anesthesiology

## 2018-01-11 ENCOUNTER — Encounter (HOSPITAL_COMMUNITY)
Admission: RE | Disposition: A | Discharge status: Home / Self Care | Payer: Self-pay | Source: Ambulatory Visit | Attending: Gastroenterology

## 2018-01-11 ENCOUNTER — Other Ambulatory Visit: Payer: Self-pay

## 2018-01-11 DIAGNOSIS — I110 Hypertensive heart disease with heart failure: Secondary | ICD-10-CM | POA: Diagnosis not present

## 2018-01-11 DIAGNOSIS — Z4659 Encounter for fitting and adjustment of other gastrointestinal appliance and device: Secondary | ICD-10-CM | POA: Diagnosis not present

## 2018-01-11 DIAGNOSIS — G4730 Sleep apnea, unspecified: Secondary | ICD-10-CM | POA: Diagnosis not present

## 2018-01-11 DIAGNOSIS — K838 Other specified diseases of biliary tract: Secondary | ICD-10-CM

## 2018-01-11 DIAGNOSIS — Z23 Encounter for immunization: Secondary | ICD-10-CM | POA: Diagnosis not present

## 2018-01-11 DIAGNOSIS — K219 Gastro-esophageal reflux disease without esophagitis: Secondary | ICD-10-CM | POA: Insufficient documentation

## 2018-01-11 DIAGNOSIS — Z79899 Other long term (current) drug therapy: Secondary | ICD-10-CM | POA: Insufficient documentation

## 2018-01-11 DIAGNOSIS — E785 Hyperlipidemia, unspecified: Secondary | ICD-10-CM | POA: Diagnosis not present

## 2018-01-11 DIAGNOSIS — K8050 Calculus of bile duct without cholangitis or cholecystitis without obstruction: Secondary | ICD-10-CM | POA: Diagnosis not present

## 2018-01-11 DIAGNOSIS — Z87891 Personal history of nicotine dependence: Secondary | ICD-10-CM | POA: Insufficient documentation

## 2018-01-11 DIAGNOSIS — M109 Gout, unspecified: Secondary | ICD-10-CM | POA: Insufficient documentation

## 2018-01-11 DIAGNOSIS — I509 Heart failure, unspecified: Secondary | ICD-10-CM | POA: Insufficient documentation

## 2018-01-11 DIAGNOSIS — K839 Disease of biliary tract, unspecified: Secondary | ICD-10-CM | POA: Diagnosis not present

## 2018-01-11 DIAGNOSIS — Z4689 Encounter for fitting and adjustment of other specified devices: Secondary | ICD-10-CM

## 2018-01-11 HISTORY — PX: STENT REMOVAL: SHX6421

## 2018-01-11 HISTORY — PX: ERCP: SHX5425

## 2018-01-11 SURGERY — ERCP, WITH INTERVENTION IF INDICATED
Anesthesia: General

## 2018-01-11 MED ORDER — GLUCAGON HCL RDNA (DIAGNOSTIC) 1 MG IJ SOLR
INTRAMUSCULAR | Status: AC
  Filled 2018-01-11: qty 1

## 2018-01-11 MED ORDER — FENTANYL CITRATE (PF) 100 MCG/2ML IJ SOLN
INTRAMUSCULAR | Status: DC | PRN
  Administered 2018-01-11: 50 ug via INTRAVENOUS

## 2018-01-11 MED ORDER — CIPROFLOXACIN IN D5W 400 MG/200ML IV SOLN
INTRAVENOUS | Status: DC | PRN
  Administered 2018-01-11: 400 mg via INTRAVENOUS

## 2018-01-11 MED ORDER — ONDANSETRON HCL 4 MG/2ML IJ SOLN
INTRAMUSCULAR | Status: DC | PRN
  Administered 2018-01-11: 4 mg via INTRAVENOUS

## 2018-01-11 MED ORDER — LIDOCAINE 2% (20 MG/ML) 5 ML SYRINGE
INTRAMUSCULAR | Status: DC | PRN
  Administered 2018-01-11: 25 mg via INTRAVENOUS
  Administered 2018-01-11: 50 mg via INTRAVENOUS

## 2018-01-11 MED ORDER — DEXAMETHASONE SODIUM PHOSPHATE 10 MG/ML IJ SOLN
INTRAMUSCULAR | Status: DC | PRN
  Administered 2018-01-11: 7 mg via INTRAVENOUS

## 2018-01-11 MED ORDER — GLYCOPYRROLATE PF 0.2 MG/ML IJ SOSY
PREFILLED_SYRINGE | INTRAMUSCULAR | Status: DC | PRN
  Administered 2018-01-11: .2 mg via INTRAVENOUS

## 2018-01-11 MED ORDER — INDOMETHACIN 50 MG RE SUPP
RECTAL | Status: DC | PRN
  Administered 2018-01-11: 100 mg via RECTAL

## 2018-01-11 MED ORDER — SUCCINYLCHOLINE CHLORIDE 200 MG/10ML IV SOSY
PREFILLED_SYRINGE | INTRAVENOUS | Status: DC | PRN
  Administered 2018-01-11: 160 mg via INTRAVENOUS

## 2018-01-11 MED ORDER — FENTANYL CITRATE (PF) 100 MCG/2ML IJ SOLN
INTRAMUSCULAR | Status: AC
  Filled 2018-01-11: qty 2

## 2018-01-11 MED ORDER — CIPROFLOXACIN IN D5W 400 MG/200ML IV SOLN
INTRAVENOUS | Status: AC
  Filled 2018-01-11: qty 200

## 2018-01-11 MED ORDER — MIDAZOLAM HCL 5 MG/5ML IJ SOLN
INTRAMUSCULAR | Status: DC | PRN
  Administered 2018-01-11: 1 mg via INTRAVENOUS

## 2018-01-11 MED ORDER — LACTATED RINGERS IV SOLN
INTRAVENOUS | Status: DC
  Administered 2018-01-11: 1000 mL via INTRAVENOUS

## 2018-01-11 MED ORDER — ALBUMIN HUMAN 5 % IV SOLN
INTRAVENOUS | Status: DC | PRN
  Administered 2018-01-11: 08:00:00 via INTRAVENOUS

## 2018-01-11 MED ORDER — SUGAMMADEX SODIUM 500 MG/5ML IV SOLN
INTRAVENOUS | Status: DC | PRN
  Administered 2018-01-11: 250 mg via INTRAVENOUS

## 2018-01-11 MED ORDER — ETOMIDATE 2 MG/ML IV SOLN
INTRAVENOUS | Status: DC | PRN
  Administered 2018-01-11: 18 mg via INTRAVENOUS

## 2018-01-11 MED ORDER — MIDAZOLAM HCL 2 MG/2ML IJ SOLN
INTRAMUSCULAR | Status: AC
  Filled 2018-01-11: qty 2

## 2018-01-11 MED ORDER — ALBUMIN HUMAN 5 % IV SOLN
INTRAVENOUS | Status: AC
  Filled 2018-01-11: qty 250

## 2018-01-11 MED ORDER — SODIUM CHLORIDE 0.9 % IV SOLN: INTRAVENOUS | Status: DC

## 2018-01-11 MED ORDER — SODIUM CHLORIDE 0.9 % IV SOLN
INTRAVENOUS | Status: DC | PRN
  Administered 2018-01-11: 50 mL

## 2018-01-11 MED ORDER — ROCURONIUM BROMIDE 10 MG/ML (PF) SYRINGE
PREFILLED_SYRINGE | INTRAVENOUS | Status: DC | PRN
  Administered 2018-01-11: 30 mg via INTRAVENOUS

## 2018-01-11 MED ORDER — INDOMETHACIN 50 MG RE SUPP
RECTAL | Status: AC
  Filled 2018-01-11: qty 2

## 2018-01-11 MED ORDER — SODIUM CHLORIDE 0.9 % IV SOLN
INTRAVENOUS | Status: DC | PRN
  Administered 2018-01-11: 25 ug/min via INTRAVENOUS

## 2018-01-11 MED ORDER — PROPOFOL 10 MG/ML IV BOLUS
INTRAVENOUS | Status: AC
  Filled 2018-01-11: qty 40

## 2018-01-11 NOTE — H&P (Signed)
History:  This patient presents for endoscopic testing for biliary stent removal and confirmation of bile leak closure.  [name] Referring physician: [facility]  Past Medical History: Past Medical History:  Diagnosis Date  . Arthritis    gout  . Cardiomyopathy    EF 30-40% (Normal coronaries cath 2010).  Echo 2015 EF 40%  . CHF (congestive heart failure) ([place])   . Colonic polyp   . Diverticulosis of colon   . GERD (gastroesophageal reflux disease)   . History of chicken pox   . HLD (hyperlipidemia)    recent  . HTN (hypertension)    recent  . Sleep apnea    does use CPAP.       Past Surgical History: Past Surgical History:  Procedure Laterality Date  . BILIARY STENT PLACEMENT  11/08/2017   Procedure: BILIARY STENT PLACEMENT;  Surgeon: Carol Ada, MD;  Location: [facility] ENDOSCOPY;  Service: Endoscopy;;  . CARDIAC CATHETERIZATION  2010  . CHOLECYSTECTOMY N/A 09/07/2017   Procedure: LAPAROSCOPIC CHOLECYSTECTOMY;  Surgeon: Kinsinger, Arta Bruce, MD;  Location: [place];  Service: General;  Laterality: N/A;  LAPAROSCOPIC CHOLECYSTECTOMY  . ERCP N/A 09/11/2017   Procedure: ENDOSCOPIC RETROGRADE CHOLANGIOPANCREATOGRAPHY (ERCP);  Surgeon: Doran Stabler, MD;  Location: [place];  Service: Gastroenterology;  Laterality: N/A;  . ERCP N/A 11/08/2017   Procedure: ENDOSCOPIC RETROGRADE CHOLANGIOPANCREATOGRAPHY (ERCP);  Surgeon: Carol Ada, MD;  Location: [place];  Service: Endoscopy;  Laterality: N/A;  . REMOVAL OF STONES  11/08/2017   Procedure: REMOVAL OF STONES;  Surgeon: Carol Ada, MD;  Location: [place];  Service: Endoscopy;;  . RIGHT/LEFT HEART CATH AND CORONARY ANGIOGRAPHY N/A 10/12/2017   Procedure: RIGHT/LEFT HEART CATH AND CORONARY ANGIOGRAPHY;  Surgeon: Larey Dresser, MD;  Location: [place] CV [facility];  Service: Cardiovascular;  Laterality: N/A;  . SPHINCTEROTOMY  11/08/2017   Procedure: SPHINCTEROTOMY;  Surgeon: Carol Ada, MD;   Location: [facility] ENDOSCOPY;  Service: Endoscopy;;  . TONSILLECTOMY AND ADENOIDECTOMY      Allergies: No Known Allergies  Outpatient Meds: Current Facility-Administered Medications  Medication Dose Route Frequency Provider Last Rate Last Dose  . 0.9 %  sodium chloride infusion   Intravenous Continuous Danis, Estill Cotta III, MD      . lactated ringers infusion   Intravenous Continuous Nelida Meuse III, MD 10 mL/hr at 01/11/18 0644 1,000 mL at 01/11/18 0644      ___________________________________________________________________ Objective   Exam:  BP (!) 115/54   Pulse (!) 54   Temp 97.7 F (36.5 C) (Oral)   Resp 16   Ht 5\' 10"  (1.778 m)   Wt 112.5 kg   SpO2 98%   BMI 35.58 kg/m    CV: RRR without murmur, S1/S2, no JVD, no peripheral edema  Resp: clear to auscultation bilaterally, normal RR and effort noted  GI: soft, no tenderness, with active bowel sounds. No guarding or palpable organomegaly noted (limited by body habitus)  Neuro: awake, alert and oriented x 3. Normal gross motor function and fluent speech Wife at bedside  Assessment/Plan: Prior CBD stone and bile leak  ERCP with biliary stent removal   Nelida Meuse III

## 2018-01-11 NOTE — Interval H&P Note (Signed)
History and Physical Interval Note:  01/11/2018 7:30 AM  Nicholas Gray  has presented today for surgery, with the diagnosis of choledocholithiasis  The various methods of treatment have been discussed with the patient and family. After consideration of risks, benefits and other options for treatment, the patient has consented to  Procedure(s): ENDOSCOPIC RETROGRADE CHOLANGIOPANCREATOGRAPHY (ERCP) stent removal (N/A) as a surgical intervention .  The patient's history has been reviewed, patient examined, no change in status, stable for surgery.  I have reviewed the patient's chart and labs.  Questions were answered to the patient's satisfaction.    See 11/2717 office visit for details  Nelida Meuse III

## 2018-01-11 NOTE — Discharge Instructions (Signed)
YOU HAD AN ENDOSCOPIC PROCEDURE TODAY: Refer to the procedure report and other information in the discharge instructions given to you for any specific questions about what was found during the examination. If this information does not answer your questions, please call [place] office at [phone] to clarify.  ° °YOU SHOULD EXPECT: Some feelings of bloating in the abdomen. Passage of more gas than usual. Walking can help get rid of the air that was put into your GI tract during the procedure and reduce the bloating. If you had a lower endoscopy (such as a colonoscopy or flexible sigmoidoscopy) you may notice spotting of blood in your stool or on the toilet paper. Some abdominal soreness may be present for a day or two, also. ° °DIET: Your first meal following the procedure should be a light meal and then it is ok to progress to your normal diet. A half-sandwich or bowl of soup is an example of a good first meal. Heavy or fried foods are harder to digest and may make you feel nauseous or bloated. Drink plenty of fluids but you should avoid alcoholic beverages for 24 hours. If you had a esophageal dilation, please see attached instructions for diet.   ° °ACTIVITY: Your care partner should take you home directly after the procedure. You should plan to take it easy, moving slowly for the rest of the day. You can resume normal activity the day after the procedure however YOU SHOULD NOT DRIVE, use power tools, machinery or perform tasks that involve climbing or major physical exertion for 24 hours (because of the sedation medicines used during the test).  ° °SYMPTOMS TO REPORT IMMEDIATELY: °A gastroenterologist can be reached at any hour. Please call [phone]  for any of the following symptoms:  °Following lower endoscopy (colonoscopy, flexible sigmoidoscopy) °Excessive amounts of blood in the stool  °Significant tenderness, worsening of abdominal pains  °Swelling of the abdomen that is new, acute  °Fever of 100° or  higher  °Following upper endoscopy (EGD, EUS, ERCP, esophageal dilation) °Vomiting of blood or coffee ground material  °New, significant abdominal pain  °New, significant chest pain or pain under the shoulder blades  °Painful or persistently difficult swallowing  °New shortness of breath  °Black, tarry-looking or red, bloody stools ° °FOLLOW UP:  °If any biopsies were taken you will be contacted by phone or by letter within the next 1-3 weeks. Call [phone]  if you have not heard about the biopsies in 3 weeks.  °Please also call with any specific questions about appointments or follow up tests. ° °

## 2018-01-11 NOTE — Op Note (Signed)
[facility] Patient Name: Nicholas Gray Procedure Date: 01/11/2018 MRN: [id number] Attending MD: Estill Cotta. Loletha Carrow , MD Date of Birth: 07/21/1960 CSN: [id number] Age: 57 Admit Type: Outpatient Procedure:                ERCP Indications:              Follow-up of bile duct stone(s), Follow-up of bile                            leak Providers:                Mallie Mussel L. Loletha Carrow, MD, Cleda Daub, RN, William Dalton, Technician Referring MD:             Gurney Maxin, MD Medicines:                General Anesthesia, Indomethacin 100 mg PR, Cipro                            448 mg IV Complications:            No immediate complications. Estimated Blood Loss:     Estimated blood loss: none. Procedure:                Pre-Anesthesia Assessment:                           - Prior to the procedure, a History and Physical                            was performed, and patient medications and                            allergies were reviewed. The patient's tolerance of                            previous anesthesia was also reviewed. The risks                            and benefits of the procedure and the sedation                            options and risks were discussed with the patient.                            All questions were answered, and informed consent                            was obtained. Prior Anticoagulants: The patient has                            taken no previous anticoagulant or antiplatelet                            agents.  ASA Grade Assessment: III - A patient with                            severe systemic disease. After reviewing the risks                            and benefits, the patient was deemed in                            satisfactory condition to undergo the procedure.                           After obtaining informed consent, the scope was                            passed under direct vision. Throughout the                             procedure, the patient's blood pressure, pulse, and                            oxygen saturations were monitored continuously. The                            Duodenoscope was introduced through the mouth, and                            used to inject contrast into and used to inject                            contrast into the bile duct. The ERCP was                            accomplished without difficulty. The patient                            tolerated the procedure well. The total fluoroscopy                            exposure time was 3 minutes and 56 seconds. Scope In: Scope Out: Findings:      The scout film was normal. The esophagus was successfully intubated       under direct vision. The scope was advanced to the major papilla in the       descending duodenum without detailed examination of the pharynx, larynx       and associated structures, and upper GI tract. The upper GI tract was       grossly normal. One stent was removed from the biliary tree using a       snare and withdrawing the scope. The stent was removed intact, and was       not to be partially clogged with sludge and debris. The duodenoscope was       re-inserted through the mouth and advanced to the major papilla. A prior       biliary  sphincterotomy had been made, and bile was flowing from the       duct. A 0.035 inch x 260 cm straight Hydra Jagwire was passed into the       biliary tree. The biliary balloon was passed over the guidewire and the       bile duct was then deeply cannulated. Contrast was injected. I       personally interpreted the bile duct images. There was brisk flow of       contrast through the ducts. Image quality was excellent. Contrast       extended to the hepatic ducts. There was no extravasation of contrast.       There was a gallbladder remnant and long cystic duct. The biliary tree       was swept with a 12 mm balloon starting at the bifurcation. Sludge was        swept from the duct. Impression:               - One stent was removed from the biliary tree.                           - The biliary tree was swept and sludge was found. Moderate Sedation:      GETA Recommendation:           - Patient has a contact number available for                            emergencies. The signs and symptoms of potential                            delayed complications were discussed with the                            patient. Return to normal activities tomorrow.                            Written discharge instructions were provided to the                            patient.                           - Resume previous diet. Procedure Code(s):        --- Professional ---                           [phone], Endoscopic retrograde                            cholangiopancreatography (ERCP); with removal of                            foreign body(s) or stent(s) from biliary/pancreatic                            duct(s)  43264, Endoscopic retrograde                            cholangiopancreatography (ERCP); with removal of                            calculi/debris from biliary/pancreatic duct(s) Diagnosis Code(s):        --- Professional ---                           O13.08, Encounter for fitting and adjustment of                            other gastrointestinal appliance and device                           K80.50, Calculus of bile duct without cholangitis                            or cholecystitis without obstruction                           K83.8, Other specified diseases of biliary tract CPT copyright 2017 American Medical Association. All rights reserved. The codes documented in this report are preliminary and upon coder review may  be revised to meet current compliance requirements. Osha Errico L. Loletha Carrow, MD 01/11/2018 8:34:22 AM This report has been signed electronically. Number of Addenda: 0

## 2018-01-11 NOTE — Anesthesia Postprocedure Evaluation (Signed)
Anesthesia Post Note  Patient: ABDURRAHMAN PETERSHEIM  Procedure(s) Performed: ENDOSCOPIC RETROGRADE CHOLANGIOPANCREATOGRAPHY (ERCP) stent removal (N/A ) STENT REMOVAL     Patient location during evaluation: PACU Anesthesia Type: General Level of consciousness: awake Pain management: pain level controlled Respiratory status: spontaneous breathing Cardiovascular status: stable Postop Assessment: no apparent nausea or vomiting Anesthetic complications: no    Last Vitals:  Vitals:   01/11/18 0850 01/11/18 0900  BP: 137/83 131/63  Pulse: (!) 49 (!) 47  Resp: 15 11  Temp:    SpO2: 100% 100%    Last Pain:  Vitals:   01/11/18 0900  TempSrc:   PainSc: 0-No pain                 Monica Zahler

## 2018-01-11 NOTE — Anesthesia Procedure Notes (Signed)
Procedure Name: Intubation Date/Time: 01/11/2018 7:42 AM Performed by: Lissa Morales, CRNA Pre-anesthesia Checklist: Patient identified, Emergency Drugs available, Suction available and Patient being monitored Patient Re-evaluated:Patient Re-evaluated prior to induction Oxygen Delivery Method: Circle system utilized Preoxygenation: Pre-oxygenation with 100% oxygen Induction Type: IV induction Ventilation: Mask ventilation without difficulty Laryngoscope Size: Mac and 4 Grade View: Grade II Tube type: Oral Tube size: 8.0 mm Number of attempts: 1 Airway Equipment and Method: Stylet and Oral airway Placement Confirmation: ETT inserted through vocal cords under direct vision,  positive ETCO2 and breath sounds checked- equal and bilateral Secured at: 23 cm Tube secured with: Tape Dental Injury: Teeth and Oropharynx as per pre-operative assessment

## 2018-01-11 NOTE — Transfer of Care (Signed)
Immediate Anesthesia Transfer of Care Note  Patient: Nicholas Gray  Procedure(s) Performed: ENDOSCOPIC RETROGRADE CHOLANGIOPANCREATOGRAPHY (ERCP) stent removal (N/A ) STENT REMOVAL  Patient Location: PACU  Anesthesia Type:General  Level of Consciousness: awake, alert , oriented and patient cooperative  Airway & Oxygen Therapy: Patient Spontanous Breathing and Patient connected to face mask oxygen  Post-op Assessment: Report given to RN, Post -op Vital signs reviewed and stable and Patient moving all extremities X 4  Post vital signs: stable  Last Vitals:  Vitals Value Taken Time  BP 122/54 01/11/2018  8:41 AM  Temp 36.4 C 01/11/2018  8:41 AM  Pulse 50 01/11/2018  8:45 AM  Resp 16 01/11/2018  8:45 AM  SpO2 100 % 01/11/2018  8:45 AM  Vitals shown include unvalidated device data.  Last Pain:  Vitals:   01/11/18 0841  TempSrc: Oral  PainSc:          Complications: No apparent anesthesia complications

## 2018-01-12 ENCOUNTER — Encounter (HOSPITAL_COMMUNITY): Payer: Self-pay | Admitting: Gastroenterology

## 2018-01-18 ENCOUNTER — Ambulatory Visit (HOSPITAL_COMMUNITY)
Admission: RE | Admit: 2018-01-18 | Discharge: 2018-01-18 | Disposition: A | Discharge status: Home / Self Care | Payer: 59 | Source: Ambulatory Visit | Attending: Cardiology | Admitting: Cardiology

## 2018-01-18 VITALS — BP 111/58 | HR 61 | Wt 258.6 lb

## 2018-01-18 DIAGNOSIS — Z6837 Body mass index (BMI) 37.0-37.9, adult: Secondary | ICD-10-CM | POA: Diagnosis not present

## 2018-01-18 DIAGNOSIS — I420 Dilated cardiomyopathy: Secondary | ICD-10-CM

## 2018-01-18 DIAGNOSIS — G4733 Obstructive sleep apnea (adult) (pediatric): Secondary | ICD-10-CM | POA: Insufficient documentation

## 2018-01-18 DIAGNOSIS — E669 Obesity, unspecified: Secondary | ICD-10-CM | POA: Insufficient documentation

## 2018-01-18 DIAGNOSIS — K219 Gastro-esophageal reflux disease without esophagitis: Secondary | ICD-10-CM | POA: Insufficient documentation

## 2018-01-18 DIAGNOSIS — Z79899 Other long term (current) drug therapy: Secondary | ICD-10-CM | POA: Diagnosis not present

## 2018-01-18 DIAGNOSIS — I428 Other cardiomyopathies: Secondary | ICD-10-CM | POA: Diagnosis not present

## 2018-01-18 DIAGNOSIS — I5022 Chronic systolic (congestive) heart failure: Secondary | ICD-10-CM | POA: Diagnosis present

## 2018-01-18 DIAGNOSIS — I110 Hypertensive heart disease with heart failure: Secondary | ICD-10-CM | POA: Diagnosis not present

## 2018-01-18 DIAGNOSIS — I447 Left bundle-branch block, unspecified: Secondary | ICD-10-CM

## 2018-01-18 MED ORDER — SPIRONOLACTONE 25 MG PO TABS: 25.0000 mg | ORAL_TABLET | Freq: Every day | ORAL | 3 refills | Status: DC

## 2018-01-18 NOTE — Patient Instructions (Signed)
Increase Spironolactone to 25 mg daily  STOP Potassium   Please follow up with Doroteo Bradford, our heart failure pharmacist in 2 weeks with lab work  Your physician has requested that you have a cardiac MRI. Cardiac MRI uses a computer to create images of your heart as its beating, producing both still and moving pictures of your heart and major blood vessels. For further information please visit [email or web address] Please follow the instruction sheet given to you today for more information.  Your physician recommends that you schedule a follow-up appointment in: Jan 2020 with Dr [name]

## 2018-01-18 NOTE — Progress Notes (Signed)
Advanced Heart Failure Clinic Note   PCP: [facility] PCP-Cardiologist: Minus Breeding, MD  HF Cardiology: Dr. [name]  HPI:  Nicholas Gray is a 57 y.o. male with chronic systolic CHF, NICM (Normal coronaries 2010), HTN, LBBB, Obesity, and OSA on CPAP.  He has been known to have a cardiomyopathy for the last 10 years or so.   Seen in [facility] office 08/13/17 and was overall feeling well. No new symptoms.  Echo 08/27/17 LVEF 25-30%, Grade 1 DD, Mild MR, Mild LAE, mild RV dilation, PA peak pressure 34 mm Hg.  Referred by Dr. Percival Spanish to CHF team for further evaluation and treatment of CHF.    Pt admitted 5/30 - 09/13/2017 with Chest/upper abdominal discomfort and pain. CT scan and HIDA consistent with acute cholecystitis. Underwent laproscopic chole on 09/07/17 and found to have gangrenous GB. 09/09/17 repeat HIDA scan showed bile leak. ERCP 09/11/17, but patient had failed stent placement. Considered for IR for drain placement, but decided on conservative management.  He was admitted again with RUQ pain in 7/19, had ERCP with bile leak and choledocholithiasis noted.  He had stone removal and stent placed.   LHC/RHC in 7/19 showed no significant CAD, optimized filling pressures and preserved cardiac output.   Today he returns for 6 week follow up. Last visit he was started on 12.5 mg spiro daily and lasix was stopped. Last week he had stent removed from ERCP. Overall feeling fine. Denies SOB/PND/Orthopnea. Appetite ok. No fever or chills. Weight at home 249 - 254 pounds. Taking all medications. 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   Labs (6/19): K 4.2, creatinine 1.14 Labs (8/19): K 3.9, creatinine 1.14, normal LFTs Labs (01/05/2018): K 4.5 creatinine 1.23   ECG: NSR, LBBB 166 msec  Social History: Lives in [place], [place]. 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 Denies heavy ETOH use.  Family history: He denies immediate family history of CHF, but  thinks his maternal grandparents may have had "heart problems"   Review of systems complete and found to be negative unless listed in HPI.    Past Medical History: 1. Chronic systolic CHF: Suspected nonischemic cardiomyopathy (normal coronaries 2010).  - Echo 2012 with EF 30% - Echo 12/15 with EF 40-45% - Echo 2017 with EF 35-40% - Echo 08/27/17 LVEF 25-30%, Grade 1 DD, Mild MR, Mild LAE, mild RV dilation, PA peak pressure 34 mm Hg.  - LHC/RHC (7/19): No significant coronary disease. RA 7, PA 39/12 mean 25, PCWP mean 11, CI 3.68, PVR 1.65 WU.  2. HTN 3. LBBB, chronic - QRS 166 on EKG 09/04/17 4. Obesity Body mass index is 37.11 kg/m. 5. OSA: Reports compliance with his CPAP.  6. GERD 7. Cholecystitis: Underwent laproscopic chole on 09/07/17 and found to have gangrenous GB.  - 09/09/17 repeat HIDA scan showed bile leak.  - ERCP 09/11/17, but patient had failed stent placement. - ERCP 7/19 with sphincterotomy, stone removal, stent placement.   Current Outpatient Medications  Medication Sig Dispense Refill  . allopurinol (ZYLOPRIM) 100 MG tablet Take 100 mg by mouth 2 (two) times daily.     . carvedilol (COREG) 25 MG tablet TAKE (1) TABLET BY MOUTH TWICE DAILY WITH A MEAL. (Patient taking differently: Take 25 mg by mouth 2 (two) times daily with a meal. ) 60 tablet 5  . furosemide (LASIX) 20 MG tablet Take 1 tablet (20 mg total) by mouth as needed for fluid or  edema. 30 tablet 3  . potassium chloride SA (K-DUR,KLOR-CON) 20 MEQ tablet Take 1 tablet (20 mEq total) by mouth daily. 30 tablet 3  . ranitidine (ZANTAC) 150 MG tablet Take 150 mg by mouth daily.    . sacubitril-valsartan (ENTRESTO) 49-51 MG Take 1 tablet by mouth 2 (two) times daily. 60 tablet 5  . simvastatin (ZOCOR) 40 MG tablet Take 40 mg by mouth at bedtime.    Marland Kitchen spironolactone (ALDACTONE) 25 MG tablet Take 0.5 tablets (12.5 mg total) by mouth daily. 45 tablet 3   No current facility-administered medications for this encounter.      No Known Allergies  Vitals:   01/18/18 1147  BP: (!) 111/58  Pulse: 61  SpO2: 99%  Weight: 117.3 kg (258 lb 9.6 oz)   Wt Readings from Last 3 Encounters:  01/18/18 117.3 kg (258 lb 9.6 oz)  01/11/18 112.5 kg (248 lb)  12/22/17 116.6 kg (257 lb)    PHYSICAL EXAM: General:  Well appearing. No resp difficulty HEENT: normal Neck: supple. no JVD. Carotids 2+ bilat; no bruits. No lymphadenopathy or thryomegaly appreciated. Cor: PMI nondisplaced. Regular rate & rhythm. No rubs, gallops or murmurs. Lungs: clear Abdomen: soft, nontender, nondistended. No hepatosplenomegaly. No bruits or masses. Good bowel sounds. Extremities: no cyanosis, clubbing, rash, edema Neuro: alert & orientedx3, cranial nerves grossly intact. moves all 4 extremities w/o difficulty. Affect pleasant anosis.  HEENT: Normal.   ASSESSMENT & PLAN:  1. Chronic systolic CHF: Nonischemic cardiomyopathy by coronary angiography in 2010 and 7/19.  He has had a cardiomyopathy for at least the last 10 years.  Recent echo in 5/19 showed a fall in EF to the 25-30% range.  He does not have an ICD as prior echoes had shown EF in the 40% range.  It is possible that he has a LBBB cardiomyopathy.  Prior viral myocarditis is also a possibility. No significant FH of CHF and no heavy ETOH/substance abuse.  - Check Fe studies to rule out hemochromatosis.  - Will titrate CHF meds until 11/19.  At that time, will arrange for cardiac MRI, both to assess for infiltrative disease and to assess EF for BiV- ICD placement.  - He has a wide LBBB, and if EF remains low, BiV pacing may be very helpful.  -NYHA II. Volume status stable. Continue lasix as needed.  - Continue current Entresto and Coreg.  - Increase spiro to 25 mg daily.  - Check BMET in 10 days.  2. HTN: Stable   3. LBBB: Chronic. As above, if EF remains low with aggressive medical management (cardiac MRI in 11/19) will need CRT-D if EF remains low. .  4. Obesity: Body mass index  is 37.11 kg/m.  - Encouraged weight loss 5. OSA: Encouraged nightly CPAP.   2 weeks with pharmacy. Aim to increase entresto if SBP ok.   Follow up 6 weeks with [facility]. If EF low will need to refer to EP.   Follow up in 8 weeks.   Amy Clegg 01/18/2018  Patient seen with NP, agree with the above note.   Patient is doing well, NYHA class I-II symptoms.  He is not volume overloaded on exam.   Increase spironolactone to 25 mg daily today with BMET today and again in 10 days.  He will followup in pharmacy clinic in 2 wks, increase Entresto at that time if BP and creatinine stable.  He can stop KCl with increase in spironolactone today.    I will arrange for a  cardiac MRI in 11/19.  If EF < 35%, I will refer him to EP for CRT-D (wide LBBB).    See HF pharmacist in 2 wks, see me in 2 months.   [name] 01/18/2018

## 2018-01-27 ENCOUNTER — Encounter: Payer: Self-pay | Admitting: Cardiology

## 2018-01-28 NOTE — Telephone Encounter (Signed)
This patient wanted me to do his surveillance colonoscopy for history of colon polyps.  His last exam was in Jan 2014 at [facility].   He has an LVEF under 35%, so it needs to be done at the hospital endoscopy lab.  Please contact him to see what is available there.  I believe I am booking into December for hospital slots.  If that is full, then let him know he will be contacted when January 2020 schedule available.

## 2018-01-29 ENCOUNTER — Other Ambulatory Visit: Payer: Self-pay

## 2018-01-29 ENCOUNTER — Telehealth: Payer: Self-pay

## 2018-01-29 DIAGNOSIS — Z86010 Personal history of colonic polyps: Secondary | ICD-10-CM

## 2018-01-29 MED ORDER — NA SULFATE-K SULFATE-MG SULF 17.5-3.13-1.6 GM/177ML PO SOLN: 1.0000 | Freq: Once | ORAL | 0 refills | Status: AC

## 2018-01-29 NOTE — Telephone Encounter (Signed)
From Dr. Loletha Carrow:  This patient wanted me to do his surveillance colonoscopy for history of colon polyps.  His last exam was in Jan 2014 at [facility].   He has an LVEF under 35%, so it needs to be done at the hospital endoscopy lab.  Please contact him to see what is available there.  I believe I am booking into December for hospital slots.  If that is full, then let him know he will be contacted when January 2020 schedule available.  Pt has been scheduled for 03-11-2018 @ [facility] endo. Instructions have been mailed to his home address on file.

## 2018-02-01 ENCOUNTER — Encounter: Payer: Self-pay | Admitting: *Deleted

## 2018-02-01 ENCOUNTER — Ambulatory Visit (HOSPITAL_COMMUNITY)
Admission: RE | Admit: 2018-02-01 | Discharge: 2018-02-01 | Disposition: A | Discharge status: Home / Self Care | Payer: 59 | Source: Ambulatory Visit | Attending: Internal Medicine | Admitting: Internal Medicine

## 2018-02-01 VITALS — BP 134/80 | HR 59 | Wt 262.6 lb

## 2018-02-01 DIAGNOSIS — I447 Left bundle-branch block, unspecified: Secondary | ICD-10-CM | POA: Diagnosis not present

## 2018-02-01 DIAGNOSIS — G4733 Obstructive sleep apnea (adult) (pediatric): Secondary | ICD-10-CM | POA: Insufficient documentation

## 2018-02-01 DIAGNOSIS — I110 Hypertensive heart disease with heart failure: Secondary | ICD-10-CM | POA: Diagnosis not present

## 2018-02-01 DIAGNOSIS — Z6837 Body mass index (BMI) 37.0-37.9, adult: Secondary | ICD-10-CM | POA: Diagnosis not present

## 2018-02-01 DIAGNOSIS — E669 Obesity, unspecified: Secondary | ICD-10-CM | POA: Diagnosis not present

## 2018-02-01 DIAGNOSIS — Z79899 Other long term (current) drug therapy: Secondary | ICD-10-CM | POA: Diagnosis not present

## 2018-02-01 DIAGNOSIS — I5022 Chronic systolic (congestive) heart failure: Secondary | ICD-10-CM | POA: Diagnosis not present

## 2018-02-01 DIAGNOSIS — I428 Other cardiomyopathies: Secondary | ICD-10-CM | POA: Insufficient documentation

## 2018-02-01 LAB — BASIC METABOLIC PANEL
Anion gap: 9 (ref 5–15)
BUN: 12 mg/dL (ref 6–20)
CALCIUM: 8.9 mg/dL (ref 8.9–10.3)
CHLORIDE: 103 mmol/L (ref 98–111)
CO2: 25 mmol/L (ref 22–32)
CREATININE: 1.11 mg/dL (ref 0.61–1.24)
GFR calc non Af Amer: >60 mL/min (ref >60)
GLUCOSE: 133 mg/dL — AB (ref 70–99)
Potassium: 3.6 mmol/L (ref 3.5–5.1)
SODIUM: 137 mmol/L (ref 135–145)

## 2018-02-01 NOTE — Progress Notes (Signed)
HF MD: [facility]  HPI:  Nicholas Gray a 57 y.o.malewith chronic systolic CHF, NICM (Normal coronaries 2010), HTN, LBBB, Obesity, and OSA on CPAP. He has been known to have a cardiomyopathy for the last 10 years or so.   Seen in [facility] office 08/13/17 and was overall feeling well. No new symptoms. Echo 08/27/17 LVEF 25-30%, Grade 1 DD, Mild MR, Mild LAE, mild RV dilation, PA peak pressure 34 mm Hg. Referred by Dr. Percival Spanish to CHF team for further evaluation and treatment of CHF.   Pt admitted 5/30 - 09/13/2017 with Chest/upper abdominal discomfort and pain. CT scan and HIDA consistent with acute cholecystitis. Underwent laproscopic chole on 09/07/17 and found to have gangrenous GB. 09/09/17 repeat HIDA scan showed bile leak. ERCP 09/11/17, but patient had failed stent placement. Considered for IR for drain placement, but decided on conservative management.He was admitted again with RUQ pain in 7/19, had ERCP with bile leak and choledocholithiasis noted. He had stone removal and stent placed.   LHC/RHC in 7/19 showed no significant CAD, optimized filling pressures and preserved cardiac output.   He returns today for pharmacist-led HF medication titration. 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    Shortness of breath/dyspnea on exertion? no   Orthopnea/PND? no  Edema? no  Lightheadedness/dizziness? no  Daily weights at home? Yes - stable ~253 lb  Blood pressure/heart rate monitoring at home? No - has BP monitor but doesn't use it  Following low-sodium/fluid-restricted diet? Yes  HF Medications: Carvedilol 25 mg PO BID Furosemide 20 mg PO daily PRN fluid/edema - none since starting spironolactone (usually edema in LE)) Entresto 49-51 mg PO BID Spironolactone 25 mg PO daily   Has the patient  been experiencing any side effects to the medications prescribed?  no  Does the patient have any problems obtaining medications due to transportation or finances?   No - [employer] commercial   Understanding of regimen: good Understanding of indications: good Potential of compliance: good Patient understands to avoid NSAIDs. Patient understands to avoid decongestants.   Pertinent Lab Values:  02/01/18: Serum creatinine 1.11, BUN 12, Potassium 3.6, Sodium 137  Vital Signs:  Weight: 262.6 lb (last HF clinic visit weight: 259 lb)  Blood pressure: 134/80 mmHg   Heart rate: 59 bpm   ReDS Vest: 42%  Assessment: 1. Chronicsystolic CHF (EF 45-80%), due to NICM. NYHA class IIsymptoms.   - Volume status slightly elevated based on weight gain and vest reading at 42%  - Increase Entresto to 97-103 mg BID   - Continue carvedilol at goal 25 mg BID, furosemide 20 mg PRN LE edema and spironolactone 25 mg daily  - Cardiac MRI on 02/09/18.  If EF < 35%, will be referred to EP for CRT-D (wide LBBB).   - Basic disease state pathophysiology, medication indication, mechanism and side effects reviewed at length with patient and he verbalized understanding  2. HTN:   - BP mostly controlled (a little above goal <130/80 mmHg)  - Increase Entresto as above  3. LBBB: Chronic. As above, if EF remains low with aggressive medical management (cardiac MRI in 11/19), will arrange for BiV pacing.  4. Obesity:Body mass index is 37.08 kg/m. - Encouraged weight loss  5. DXI:[id number] nightly CPAP.   Plan: 1) Medication changes: Based on  clinical presentation, vital signs and recent labs will increase Entresto to 97-103 mg BID 2) Labs: BMET today and in 2 weeks 3) Follow-up: MRI on 11/5 and Dr. [name] on 04/13/18   Ruta Hinds. Velva Harman, PharmD, BCPS, [place] Clinical Pharmacist Phone: [phone]

## 2018-02-01 NOTE — Patient Instructions (Addendum)
It was great to see you today!  Please INCREASE Entresto to 97-103 mg (1 tablet) TWICE DAILY.   You are scheduled for blood work again on 11/11 at 8:30 am.   Please keep your appointment with Dr. [name] on 04/13/18.

## 2018-02-09 ENCOUNTER — Ambulatory Visit (HOSPITAL_COMMUNITY)
Admission: RE | Admit: 2018-02-09 | Discharge: 2018-02-09 | Disposition: A | Discharge status: Home / Self Care | Payer: 59 | Source: Ambulatory Visit | Attending: Cardiology | Admitting: Cardiology

## 2018-02-09 DIAGNOSIS — I420 Dilated cardiomyopathy: Secondary | ICD-10-CM

## 2018-02-09 DIAGNOSIS — I5022 Chronic systolic (congestive) heart failure: Secondary | ICD-10-CM

## 2018-02-09 MED ORDER — GADOBUTROL 1 MMOL/ML IV SOLN
12.0000 mL | Freq: Once | INTRAVENOUS | Status: AC | PRN
  Administered 2018-02-09: 12 mL via INTRAVENOUS

## 2018-02-15 ENCOUNTER — Other Ambulatory Visit (HOSPITAL_COMMUNITY): Payer: Self-pay | Admitting: Pharmacist

## 2018-02-15 ENCOUNTER — Ambulatory Visit (HOSPITAL_COMMUNITY)
Admission: RE | Admit: 2018-02-15 | Discharge: 2018-02-15 | Disposition: A | Discharge status: Home / Self Care | Payer: 59 | Source: Ambulatory Visit | Attending: Cardiology | Admitting: Cardiology

## 2018-02-15 DIAGNOSIS — I5022 Chronic systolic (congestive) heart failure: Secondary | ICD-10-CM | POA: Insufficient documentation

## 2018-02-15 LAB — BASIC METABOLIC PANEL
Anion gap: 7 (ref 5–15)
BUN: 20 mg/dL (ref 6–20)
CO2: 26 mmol/L (ref 22–32)
CREATININE: 1.16 mg/dL (ref 0.61–1.24)
Calcium: 8.8 mg/dL — ABNORMAL LOW (ref 8.9–10.3)
Chloride: 103 mmol/L (ref 98–111)
GFR calc Af Amer: >60 mL/min (ref >60)
GLUCOSE: 123 mg/dL — AB (ref 70–99)
POTASSIUM: 3.9 mmol/L (ref 3.5–5.1)
SODIUM: 136 mmol/L (ref 135–145)

## 2018-02-15 MED ORDER — SACUBITRIL-VALSARTAN 97-103 MG PO TABS: 1.0000 | ORAL_TABLET | Freq: Two times a day (BID) | ORAL | 11 refills | Status: DC

## 2018-03-01 ENCOUNTER — Encounter (HOSPITAL_COMMUNITY): Payer: Self-pay | Admitting: *Deleted

## 2018-03-08 ENCOUNTER — Ambulatory Visit (HOSPITAL_COMMUNITY)
Admission: RE | Admit: 2018-03-08 | Discharge: 2018-03-08 | Disposition: A | Discharge status: Home / Self Care | Payer: 59 | Source: Ambulatory Visit | Attending: Gastroenterology | Admitting: Gastroenterology

## 2018-03-08 ENCOUNTER — Encounter (HOSPITAL_COMMUNITY): Payer: Self-pay | Admitting: *Deleted

## 2018-03-08 ENCOUNTER — Ambulatory Visit (HOSPITAL_COMMUNITY): Payer: 59 | Admitting: Certified Registered Nurse Anesthetist

## 2018-03-08 ENCOUNTER — Encounter (HOSPITAL_COMMUNITY)
Admission: RE | Disposition: A | Discharge status: Home / Self Care | Payer: Self-pay | Source: Ambulatory Visit | Attending: Gastroenterology

## 2018-03-08 ENCOUNTER — Other Ambulatory Visit: Payer: Self-pay

## 2018-03-08 DIAGNOSIS — Z1211 Encounter for screening for malignant neoplasm of colon: Secondary | ICD-10-CM | POA: Diagnosis not present

## 2018-03-08 DIAGNOSIS — I110 Hypertensive heart disease with heart failure: Secondary | ICD-10-CM | POA: Diagnosis not present

## 2018-03-08 DIAGNOSIS — Z79899 Other long term (current) drug therapy: Secondary | ICD-10-CM | POA: Insufficient documentation

## 2018-03-08 DIAGNOSIS — M109 Gout, unspecified: Secondary | ICD-10-CM | POA: Insufficient documentation

## 2018-03-08 DIAGNOSIS — D123 Benign neoplasm of transverse colon: Secondary | ICD-10-CM

## 2018-03-08 DIAGNOSIS — G4730 Sleep apnea, unspecified: Secondary | ICD-10-CM | POA: Insufficient documentation

## 2018-03-08 DIAGNOSIS — Z87891 Personal history of nicotine dependence: Secondary | ICD-10-CM | POA: Diagnosis not present

## 2018-03-08 DIAGNOSIS — Z8719 Personal history of other diseases of the digestive system: Secondary | ICD-10-CM | POA: Diagnosis not present

## 2018-03-08 DIAGNOSIS — I509 Heart failure, unspecified: Secondary | ICD-10-CM | POA: Diagnosis not present

## 2018-03-08 DIAGNOSIS — I429 Cardiomyopathy, unspecified: Secondary | ICD-10-CM | POA: Insufficient documentation

## 2018-03-08 DIAGNOSIS — E785 Hyperlipidemia, unspecified: Secondary | ICD-10-CM | POA: Insufficient documentation

## 2018-03-08 DIAGNOSIS — Z86010 Personal history of colonic polyps: Secondary | ICD-10-CM | POA: Diagnosis not present

## 2018-03-08 DIAGNOSIS — K5730 Diverticulosis of large intestine without perforation or abscess without bleeding: Secondary | ICD-10-CM | POA: Diagnosis not present

## 2018-03-08 DIAGNOSIS — K219 Gastro-esophageal reflux disease without esophagitis: Secondary | ICD-10-CM | POA: Insufficient documentation

## 2018-03-08 HISTORY — PX: COLONOSCOPY WITH PROPOFOL: SHX5780

## 2018-03-08 HISTORY — PX: BIOPSY: SHX5522

## 2018-03-08 SURGERY — COLONOSCOPY WITH PROPOFOL
Anesthesia: Monitor Anesthesia Care

## 2018-03-08 MED ORDER — LACTATED RINGERS IV SOLN
INTRAVENOUS | Status: DC
  Administered 2018-03-08: 1000 mL via INTRAVENOUS

## 2018-03-08 MED ORDER — SODIUM CHLORIDE 0.9 % IV SOLN: INTRAVENOUS | Status: DC

## 2018-03-08 MED ORDER — PROPOFOL 500 MG/50ML IV EMUL
INTRAVENOUS | Status: DC | PRN
  Administered 2018-03-08: 125 ug/kg/min via INTRAVENOUS

## 2018-03-08 MED ORDER — PROPOFOL 10 MG/ML IV BOLUS
INTRAVENOUS | Status: AC
  Filled 2018-03-08: qty 60

## 2018-03-08 MED ORDER — EPHEDRINE SULFATE 50 MG/ML IJ SOLN
INTRAMUSCULAR | Status: DC | PRN
  Administered 2018-03-08: 10 mg via INTRAVENOUS

## 2018-03-08 MED ORDER — PROPOFOL 500 MG/50ML IV EMUL
INTRAVENOUS | Status: DC | PRN
  Administered 2018-03-08: 30 mg via INTRAVENOUS

## 2018-03-08 SURGICAL SUPPLY — 21 items

## 2018-03-08 NOTE — H&P (Signed)
History:  This patient presents for endoscopic testing for colon cancer screening.  [name] Referring physician: [facility]  Past Medical History: Past Medical History:  Diagnosis Date  . Arthritis    gout  . Cardiomyopathy    EF 30-40% (Normal coronaries cath 2010).  Echo 2015 EF 40%  . CHF (congestive heart failure) ([place])   . Colonic polyp   . Diverticulosis of colon   . GERD (gastroesophageal reflux disease)   . History of chicken pox   . HLD (hyperlipidemia)    recent  . HTN (hypertension)    recent  . Sleep apnea    does use CPAP.       Past Surgical History: Past Surgical History:  Procedure Laterality Date  . BILIARY STENT PLACEMENT  11/08/2017   Procedure: BILIARY STENT PLACEMENT;  Surgeon: Carol Ada, MD;  Location: [facility] ENDOSCOPY;  Service: Endoscopy;;  . CARDIAC CATHETERIZATION  2010  . CHOLECYSTECTOMY N/A 09/07/2017   Procedure: LAPAROSCOPIC CHOLECYSTECTOMY;  Surgeon: Kinsinger, Arta Bruce, MD;  Location: [place];  Service: General;  Laterality: N/A;  LAPAROSCOPIC CHOLECYSTECTOMY  . ERCP N/A 09/11/2017   Procedure: ENDOSCOPIC RETROGRADE CHOLANGIOPANCREATOGRAPHY (ERCP);  Surgeon: Doran Stabler, MD;  Location: [place];  Service: Gastroenterology;  Laterality: N/A;  . ERCP N/A 11/08/2017   Procedure: ENDOSCOPIC RETROGRADE CHOLANGIOPANCREATOGRAPHY (ERCP);  Surgeon: Carol Ada, MD;  Location: [place];  Service: Endoscopy;  Laterality: N/A;  . ERCP N/A 01/11/2018   Procedure: ENDOSCOPIC RETROGRADE CHOLANGIOPANCREATOGRAPHY;  Surgeon: Doran Stabler, MD;  Location: [facility] ENDOSCOPY;  Service: Gastroenterology;  Laterality: N/A;  With Balloon Sweep of Ducts  . REMOVAL OF STONES  11/08/2017   Procedure: REMOVAL OF STONES;  Surgeon: Carol Ada, MD;  Location: [place];  Service: Endoscopy;;  . RIGHT/LEFT HEART CATH AND CORONARY ANGIOGRAPHY N/A 10/12/2017   Procedure: RIGHT/LEFT HEART CATH AND CORONARY ANGIOGRAPHY;  Surgeon:  Larey Dresser, MD;  Location: [place] CV LAB;  Service: Cardiovascular;  Laterality: N/A;  . SPHINCTEROTOMY  11/08/2017   Procedure: SPHINCTEROTOMY;  Surgeon: Carol Ada, MD;  Location: [facility] ENDOSCOPY;  Service: Endoscopy;;  . STENT REMOVAL  01/11/2018   Procedure: STENT REMOVAL;  Surgeon: Doran Stabler, MD;  Location: [facility] ENDOSCOPY;  Service: Gastroenterology;;  . TONSILLECTOMY AND ADENOIDECTOMY      Allergies: No Known Allergies  Outpatient Meds: Current Facility-Administered Medications  Medication Dose Route Frequency Provider Last Rate Last Dose  . 0.9 %  sodium chloride infusion   Intravenous Continuous Danis, Estill Cotta III, MD      . lactated ringers infusion   Intravenous Continuous Danis, Estill Cotta III, MD          ___________________________________________________________________ Objective   Exam:  There were no vitals taken for this visit.   CV: RRR without murmur, S1/S2, no JVD, no peripheral edema  Resp: clear to auscultation bilaterally, normal RR and effort noted  GI: soft, no tenderness, with active bowel sounds. No guarding or palpable organomegaly noted.  Neuro: awake, alert and oriented x 3. Normal gross motor function and fluent speech   Assessment:  Colon cancer screening  Plan:  Colonoscopy   Nicholas Gray

## 2018-03-08 NOTE — Anesthesia Preprocedure Evaluation (Addendum)
Anesthesia Evaluation  Patient identified by MRN, date of birth, ID band Patient awake    Reviewed: Allergy & Precautions, NPO status , Patient's Chart, lab work & pertinent test results  Airway Mallampati: IV  TM Distance: >3 FB Neck ROM: Full    Dental  (+) Teeth Intact, Dental Advisory Given   Pulmonary sleep apnea , former smoker,    breath sounds clear to auscultation       Cardiovascular hypertension, +CHF  + dysrhythmias  Rhythm:Regular Rate:Normal     Neuro/Psych negative neurological ROS  negative psych ROS   GI/Hepatic Neg liver ROS, GERD  ,  Endo/Other  negative endocrine ROS  Renal/GU negative Renal ROS     Musculoskeletal  (+) Arthritis ,   Abdominal (+) + obese,   Peds  Hematology negative hematology ROS (+)   Anesthesia Other Findings - HLD  Reproductive/Obstetrics                            Anesthesia Physical Anesthesia Plan  ASA: III  Anesthesia Plan: MAC   Post-op Pain Management:    Induction: Intravenous  PONV Risk Score and Plan: Propofol infusion  Airway Management Planned: Nasal Cannula and Natural Airway  Additional Equipment: None  Intra-op Plan:   Post-operative Plan:   Informed Consent: I have reviewed the patients History and Physical, chart, labs and discussed the procedure including the risks, benefits and alternatives for the proposed anesthesia with the patient or authorized representative who has indicated his/her understanding and acceptance.     Plan Discussed with: CRNA  Anesthesia Plan Comments:        Anesthesia Quick Evaluation

## 2018-03-08 NOTE — Op Note (Signed)
[facility] Patient Name: Nicholas Gray Procedure Date: 03/08/2018 MRN: [id number] Attending MD: Estill Cotta. Loletha Carrow , MD Date of Birth: 07-13-1960 CSN: [id number] Age: 57 Admit Type: Outpatient Procedure:                Colonoscopy Indications:              Surveillance: Personal history of adenomatous                            polyps on last colonoscopy > 5 years ago (TA x 2                            and hyperplastic polyps Jan 2014) Providers:                Estill Cotta. Loletha Carrow, MD, Cleda Daub, RN, William Dalton, Technician Referring MD:              Medicines:                 Complications:            No immediate complications. Estimated Blood Loss:     Estimated blood loss was minimal. Procedure:                Pre-Anesthesia Assessment:                           - Prior to the procedure, a History and Physical                            was performed, and patient medications and                            allergies were reviewed. The patient's tolerance of                            previous anesthesia was also reviewed. The risks                            and benefits of the procedure and the sedation                            options and risks were discussed with the patient.                            All questions were answered, and informed consent                            was obtained. Prior Anticoagulants: The patient has                            taken no previous anticoagulant or antiplatelet  agents. ASA Grade Assessment: III - A patient with                            severe systemic disease. After reviewing the risks                            and benefits, the patient was deemed in                            satisfactory condition to undergo the procedure.                           After obtaining informed consent, the colonoscope                            was passed under direct vision.  Throughout the                            procedure, the patient's blood pressure, pulse, and                            oxygen saturations were monitored continuously. The                            CF-HQ190L ([id number] Olympus adult colonoscope was                            introduced through the anus and advanced to the the                            cecum, identified by appendiceal orifice and                            ileocecal valve. The colonoscopy was performed                            without difficulty. The patient tolerated the                            procedure well. The quality of the bowel                            preparation was excellent. The ileocecal valve,                            appendiceal orifice, and rectum were photographed. Scope In: 10:46:14 AM Scope Out: 10:59:10 AM Scope Withdrawal Time: 0 hours 7 minutes 55 seconds  Total Procedure Duration: 0 hours 12 minutes 56 seconds  Findings:      The perianal and digital rectal examinations were normal.      A 2 mm polyp was found in the transverse colon. The polyp was sessile.       The polyp was removed with a cold biopsy forceps. Resection and       retrieval were complete.  Multiple diverticula were found in the left colon.      The exam was otherwise without abnormality on direct and retroflexion       views. Impression:               - One 2 mm polyp in the transverse colon, removed                            with a cold biopsy forceps. Resected and retrieved.                           - Diverticulosis in the left colon.                           - The examination was otherwise normal on direct                            and retroflexion views. Moderate Sedation:      Not Applicable - Patient had care per Anesthesia. Recommendation:           - Patient has a contact number available for                            emergencies. The signs and symptoms of potential                            delayed  complications were discussed with the                            patient. Return to normal activities tomorrow.                            Written discharge instructions were provided to the                            patient.                           - Resume previous diet.                           - Continue present medications.                           - Await pathology results.                           - Repeat colonoscopy is recommended for                            surveillance. The colonoscopy date will be                            determined after pathology results from today's                            exam become  available for review. Procedure Code(s):        --- Professional ---                           [phone], Colonoscopy, flexible; with biopsy, single                            or multiple Diagnosis Code(s):        --- Professional ---                           Z86.010, Personal history of colonic polyps                           D12.3, Benign neoplasm of transverse colon (hepatic                            flexure or splenic flexure)                           K57.30, Diverticulosis of large intestine without                            perforation or abscess without bleeding CPT copyright 2018 American Medical Association. All rights reserved. The codes documented in this report are preliminary and upon coder review may  be revised to meet current compliance requirements. [name], MD 03/08/2018 11:03:46 AM This report has been signed electronically. Number of Addenda: 0

## 2018-03-08 NOTE — Discharge Instructions (Signed)
YOU HAD AN ENDOSCOPIC PROCEDURE TODAY: Refer to the procedure report and other information in the discharge instructions given to you for any specific questions about what was found during the examination. If this information does not answer your questions, please call [place] office at [phone] to clarify.  ° °YOU SHOULD EXPECT: Some feelings of bloating in the abdomen. Passage of more gas than usual. Walking can help get rid of the air that was put into your GI tract during the procedure and reduce the bloating. If you had a lower endoscopy (such as a colonoscopy or flexible sigmoidoscopy) you may notice spotting of blood in your stool or on the toilet paper. Some abdominal soreness may be present for a day or two, also. ° °DIET: Your first meal following the procedure should be a light meal and then it is ok to progress to your normal diet. A half-sandwich or bowl of soup is an example of a good first meal. Heavy or fried foods are harder to digest and may make you feel nauseous or bloated. Drink plenty of fluids but you should avoid alcoholic beverages for 24 hours. If you had a esophageal dilation, please see attached instructions for diet.   ° °ACTIVITY: Your care partner should take you home directly after the procedure. You should plan to take it easy, moving slowly for the rest of the day. You can resume normal activity the day after the procedure however YOU SHOULD NOT DRIVE, use power tools, machinery or perform tasks that involve climbing or major physical exertion for 24 hours (because of the sedation medicines used during the test).  ° °SYMPTOMS TO REPORT IMMEDIATELY: °A gastroenterologist can be reached at any hour. Please call [phone]  for any of the following symptoms:  °Following lower endoscopy (colonoscopy, flexible sigmoidoscopy) °Excessive amounts of blood in the stool  °Significant tenderness, worsening of abdominal pains  °Swelling of the abdomen that is new, acute  °Fever of 100° or  higher  °Following upper endoscopy (EGD, EUS, ERCP, esophageal dilation) °Vomiting of blood or coffee ground material  °New, significant abdominal pain  °New, significant chest pain or pain under the shoulder blades  °Painful or persistently difficult swallowing  °New shortness of breath  °Black, tarry-looking or red, bloody stools ° °FOLLOW UP:  °If any biopsies were taken you will be contacted by phone or by letter within the next 1-3 weeks. Call [phone]  if you have not heard about the biopsies in 3 weeks.  °Please also call with any specific questions about appointments or follow up tests. ° °

## 2018-03-08 NOTE — Anesthesia Postprocedure Evaluation (Signed)
Anesthesia Post Note  Patient: Nicholas Gray  Procedure(s) Performed: COLONOSCOPY WITH PROPOFOL (N/A ) BIOPSY     Patient location during evaluation: PACU Anesthesia Type: MAC Level of consciousness: awake and alert Pain management: pain level controlled Vital Signs Assessment: post-procedure vital signs reviewed and stable Respiratory status: spontaneous breathing, nonlabored ventilation, respiratory function stable and patient connected to nasal cannula oxygen Cardiovascular status: stable and blood pressure returned to baseline Postop Assessment: no apparent nausea or vomiting Anesthetic complications: no    Last Vitals:  Vitals:   03/08/18 1110 03/08/18 1118  BP: (!) 97/53 117/72  Pulse: 67 62  Resp: 13 16  Temp:    SpO2: 99% 98%    Last Pain:  Vitals:   03/08/18 1118  TempSrc:   PainSc: 0-No pain                 Effie Berkshire

## 2018-03-08 NOTE — Transfer of Care (Signed)
Immediate Anesthesia Transfer of Care Note  Patient: Nicholas Gray  Procedure(s) Performed: COLONOSCOPY WITH PROPOFOL (N/A ) BIOPSY  Patient Location: PACU  Anesthesia Type:MAC  Level of Consciousness: awake, alert  and oriented  Airway & Oxygen Therapy: Patient Spontanous Breathing and Patient connected to face mask oxygen  Post-op Assessment: Report given to RN and Post -op Vital signs reviewed and stable  Post vital signs: Reviewed and stable  Last Vitals:  Vitals Value Taken Time  BP    Temp    Pulse 70 03/08/2018 11:05 AM  Resp 15 03/08/2018 11:05 AM  SpO2 97 % 03/08/2018 11:05 AM  Vitals shown include unvalidated device data.  Last Pain:  Vitals:   03/08/18 1000  TempSrc: Oral  PainSc: 0-No pain         Complications: No apparent anesthesia complications

## 2018-03-08 NOTE — Interval H&P Note (Signed)
History and Physical Interval Note:  03/08/2018 10:16 AM  Nicholas Gray  has presented today for surgery, with the diagnosis of Hx of colon polpys tt  The various methods of treatment have been discussed with the patient and family. After consideration of risks, benefits and other options for treatment, the patient has consented to  Procedure(s): COLONOSCOPY WITH PROPOFOL (N/A) as a surgical intervention .  The patient's history has been reviewed, patient examined, no change in status, stable for surgery.  I have reviewed the patient's chart and labs.  Questions were answered to the patient's satisfaction.     Nelida Meuse III

## 2018-03-09 ENCOUNTER — Encounter: Payer: Self-pay | Admitting: Gastroenterology

## 2018-03-10 ENCOUNTER — Encounter (HOSPITAL_COMMUNITY): Payer: Self-pay | Admitting: Gastroenterology

## 2018-03-16 DIAGNOSIS — G4733 Obstructive sleep apnea (adult) (pediatric): Secondary | ICD-10-CM | POA: Diagnosis not present

## 2018-03-23 DIAGNOSIS — E782 Mixed hyperlipidemia: Secondary | ICD-10-CM | POA: Diagnosis not present

## 2018-03-23 DIAGNOSIS — G4739 Other sleep apnea: Secondary | ICD-10-CM | POA: Diagnosis not present

## 2018-03-23 DIAGNOSIS — Z1389 Encounter for screening for other disorder: Secondary | ICD-10-CM | POA: Diagnosis not present

## 2018-03-23 DIAGNOSIS — I10 Essential (primary) hypertension: Secondary | ICD-10-CM | POA: Diagnosis not present

## 2018-03-23 DIAGNOSIS — R7309 Other abnormal glucose: Secondary | ICD-10-CM | POA: Diagnosis not present

## 2018-03-23 DIAGNOSIS — R600 Localized edema: Secondary | ICD-10-CM | POA: Diagnosis not present

## 2018-03-23 DIAGNOSIS — G4730 Sleep apnea, unspecified: Secondary | ICD-10-CM | POA: Diagnosis not present

## 2018-04-13 ENCOUNTER — Encounter (HOSPITAL_COMMUNITY): Payer: Self-pay | Admitting: Cardiology

## 2018-04-13 ENCOUNTER — Ambulatory Visit (HOSPITAL_COMMUNITY)
Admission: RE | Admit: 2018-04-13 | Discharge: 2018-04-13 | Disposition: A | Discharge status: Home / Self Care | Payer: 59 | Source: Ambulatory Visit | Attending: Cardiology | Admitting: Cardiology

## 2018-04-13 VITALS — BP 112/70 | HR 68 | Wt 267.0 lb

## 2018-04-13 DIAGNOSIS — G4733 Obstructive sleep apnea (adult) (pediatric): Secondary | ICD-10-CM | POA: Diagnosis not present

## 2018-04-13 DIAGNOSIS — E669 Obesity, unspecified: Secondary | ICD-10-CM | POA: Diagnosis not present

## 2018-04-13 DIAGNOSIS — I428 Other cardiomyopathies: Secondary | ICD-10-CM | POA: Insufficient documentation

## 2018-04-13 DIAGNOSIS — K219 Gastro-esophageal reflux disease without esophagitis: Secondary | ICD-10-CM | POA: Diagnosis not present

## 2018-04-13 DIAGNOSIS — Z79899 Other long term (current) drug therapy: Secondary | ICD-10-CM | POA: Diagnosis not present

## 2018-04-13 DIAGNOSIS — I5022 Chronic systolic (congestive) heart failure: Secondary | ICD-10-CM | POA: Insufficient documentation

## 2018-04-13 DIAGNOSIS — I447 Left bundle-branch block, unspecified: Secondary | ICD-10-CM | POA: Insufficient documentation

## 2018-04-13 DIAGNOSIS — Z6838 Body mass index (BMI) 38.0-38.9, adult: Secondary | ICD-10-CM | POA: Diagnosis not present

## 2018-04-13 DIAGNOSIS — I110 Hypertensive heart disease with heart failure: Secondary | ICD-10-CM | POA: Insufficient documentation

## 2018-04-13 DIAGNOSIS — Z87891 Personal history of nicotine dependence: Secondary | ICD-10-CM | POA: Diagnosis not present

## 2018-04-13 LAB — BASIC METABOLIC PANEL
Anion gap: 11 (ref 5–15)
BUN: 10 mg/dL (ref 6–20)
CO2: 23 mmol/L (ref 22–32)
Calcium: 8.6 mg/dL — ABNORMAL LOW (ref 8.9–10.3)
Chloride: 102 mmol/L (ref 98–111)
Creatinine, Ser: 1.13 mg/dL (ref 0.61–1.24)
GFR calc Af Amer: >60 mL/min (ref >60)
GFR calc non Af Amer: >60 mL/min (ref >60)
Glucose, Bld: 106 mg/dL — ABNORMAL HIGH (ref 70–99)
Potassium: 3.9 mmol/L (ref 3.5–5.1)
Sodium: 136 mmol/L (ref 135–145)

## 2018-04-13 MED ORDER — SACUBITRIL-VALSARTAN 97-103 MG PO TABS: 1.0000 | ORAL_TABLET | Freq: Two times a day (BID) | ORAL | 11 refills | Status: DC

## 2018-04-13 NOTE — Progress Notes (Signed)
CSW met with pt to assist with [employer] application for [employer].  Pt has been on Entresto chronically but recently lost his job and therefore his insurance coverage.   Pt trying to find another job so he can get coverage but would not be able to afford insurance through marketplace at this time and would likely not be eligible for insurance through his workplace until hes worked for at least 3 months.  Pt confirms he has a couple of weeks left of Entresto and CSW told him he will need to call clinic when he starts running low so we can help him get medication until job or assistance program comes through.  Pt will need to either get samples if available or go through [place] until we can get more permanent plan in place.  CSW faxed in completed [employer] application- fax confirmation received.  Jorge Ny, LCSW Clinical Social Worker [facility] [phone]

## 2018-04-13 NOTE — Progress Notes (Signed)
[facility] Note   PCP: [facility] PCP-Cardiologist: Minus Breeding, MD  HF Cardiology: Dr. [name]  HPI:  Nicholas Gray is a 58 y.o. male with chronic systolic CHF, NICM (Normal coronaries 2010), HTN, LBBB, Obesity, and OSA on CPAP.  He has been known to have a cardiomyopathy for the last 10 years or so.   Seen in [facility] office 08/13/17 and was overall feeling well. No new symptoms.  Echo 08/27/17 LVEF 25-30%, Grade 1 DD, Mild MR, Mild LAE, mild RV dilation, PA peak pressure 34 mm Hg.  Referred by Dr. Percival Spanish to CHF team for further evaluation and treatment of CHF.    Pt admitted 5/30 - 09/13/2017 with Chest/upper abdominal discomfort and pain. CT scan and HIDA consistent with acute cholecystitis. Underwent laproscopic chole on 09/07/17 and found to have gangrenous GB. 09/09/17 repeat HIDA scan showed bile leak. ERCP 09/11/17, but patient had failed stent placement. Considered for IR for drain placement, but decided on conservative management.  He was admitted again with RUQ pain in 7/19, had ERCP with bile leak and choledocholithiasis noted.  He had stone removal and stent placed.   LHC/RHC in 7/19 showed no significant CAD, optimized filling pressures and preserved cardiac output. Cardiac MRI in 11/19 showed improvement in LV EF to 51% with mildly decreased RV systolic function, no LGE.   He returns for followup of CHF.  Unfortunately lost his job recently and will be losing his health insurance.  He is mildly short of breath walking up stairs or with heavy exertion.   No dyspnea walking on flat ground.  No chest pain.  Occasional lightheadedness when lifting something heavy.  No orthopnea/PND.  Weight is up 9 lbs, he attributes this to diet over the holidays.    Labs (6/19): K 4.2, creatinine 1.14 Labs (8/19): K 3.9, creatinine 1.14, normal LFTs Labs (01/05/2018): K 4.5 creatinine 1.23  Labs (11/19): K 3.9, creatinine 1.16  Social History: Lives in [place],  [place]. 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 Denies heavy ETOH use.  Family history: He denies immediate family history of CHF, but thinks his maternal grandparents may have had "heart problems"   Review of systems complete and found to be negative unless listed in HPI.    Past Medical History: 1. Chronic systolic CHF: Suspected nonischemic cardiomyopathy (normal coronaries 2010).  - Echo 2012 with EF 30% - Echo 12/15 with EF 40-45% - Echo 2017 with EF 35-40% - Echo 08/27/17 LVEF 25-30%, Grade 1 DD, Mild MR, Mild LAE, mild RV dilation, PA peak pressure 34 mm Hg.  - LHC/RHC (7/19): No significant coronary disease. RA 7, PA 39/12 mean 25, PCWP mean 11, CI 3.68, PVR 1.65 WU.  - Cardiac MRI (11/19): LV EF 51% with septal-lateral dyssynchrony, mild RV dilation with RV EF 39%, no LGE.  2. HTN 3. LBBB, chronic - QRS 166 on EKG 09/04/17 4. Obesity Body mass index is 38.31 kg/m. 5. OSA: Reports compliance with his CPAP.  6. GERD 7. Cholecystitis: Underwent laproscopic chole on 09/07/17 and found to have gangrenous GB.  - 09/09/17 repeat HIDA scan showed bile leak.  - ERCP 09/11/17, but patient had failed stent placement. - ERCP 7/19 with sphincterotomy, stone removal, stent placement.   Current Outpatient Medications  Medication Sig Dispense Refill  . allopurinol (ZYLOPRIM) 100 MG tablet Take 100 mg by mouth 2 (two) times daily.     . carvedilol (COREG) 25  MG tablet Take 25 mg by mouth 2 (two) times daily with a meal.    . ranitidine (ZANTAC) 150 MG tablet Take 150 mg by mouth daily.    . sacubitril-valsartan (ENTRESTO) 97-103 MG Take 1 tablet by mouth 2 (two) times daily. 60 tablet 11  . simvastatin (ZOCOR) 40 MG tablet Take 40 mg by mouth at bedtime.    Marland Kitchen spironolactone (ALDACTONE) 25 MG tablet Take 1 tablet (25 mg total) by mouth daily. 90 tablet 3  . furosemide (LASIX) 20 MG tablet Take 1 tablet (20 mg total) by mouth as needed for fluid or edema. (Patient  not taking: Reported on 04/13/2018) 30 tablet 3   No current facility-administered medications for this encounter.    No Known Allergies  Vitals:   04/13/18 0911  BP: 112/70  Pulse: 68  SpO2: 98%  Weight: 121.1 kg (267 lb)   Wt Readings from Last 3 Encounters:  04/13/18 121.1 kg (267 lb)  03/08/18 119.2 kg (262 lb 12.6 oz)  02/01/18 119.1 kg (262 lb 9.6 oz)    PHYSICAL EXAM: General: NAD Neck: Thick, no JVD, no thyromegaly or thyroid nodule.  Lungs: Clear to auscultation bilaterally with normal respiratory effort. CV: Nondisplaced PMI.  Heart regular S1/S2, no S3/S4, no murmur.  No peripheral edema.  No carotid bruit.  Normal pedal pulses.  Abdomen: Soft, nontender, no hepatosplenomegaly, no distention.  Skin: Intact without lesions or rashes.  Neurologic: Alert and oriented x 3.  Psych: Normal affect. Extremities: No clubbing or cyanosis.  HEENT: Normal.   ASSESSMENT & PLAN:  1. Chronic systolic CHF: Nonischemic cardiomyopathy by coronary angiography in 2010 and 7/19.  He has had a cardiomyopathy for at least the last 10 years.  Recent echo in 5/19 showed a fall in EF to the 25-30% range.  He does not have an ICD as prior echoes had shown EF in the 40% range.  It is possible that he has a LBBB cardiomyopathy.  Prior viral myocarditis is also a possibility. No significant FH of CHF and no heavy ETOH/substance abuse.  Cardiac MRI in 11/19 showed LV EF up to 51%, no LGE. He is not volume overloaded.  NYHA class II symptoms. I think his weight gain is caloric rather than fluid-related.  - He does not appear to need standing Lasix.  - Continue current Entresto, spironolactone, and Coreg (all at goal).  - BMET today.   2. HTN: BP controlled.  3. LBBB: Chronic. EF improved to 51% on 11/19 cMRI so out of range for CRT-D.  4. Obesity: Body mass index is 38.31 kg/m.  - Encouraged weight loss 5. OSA: Encouraged nightly CPAP.   Followup in 6 wks.  Will have social worker help him with  insurance issues now that he has lost his job.   [name] 04/13/2018

## 2018-04-13 NOTE — Patient Instructions (Signed)
Labs today We will only contact you if something comes back abnormal or we need to make some changes. Otherwise no news is good news!  Your physician recommends that you schedule a follow-up appointment in: 6 months. Please call in April to schedule your appointment.

## 2018-05-06 ENCOUNTER — Telehealth (HOSPITAL_COMMUNITY): Payer: Self-pay | Admitting: Licensed Clinical Social Worker

## 2018-05-06 NOTE — Telephone Encounter (Signed)
Clinic received notifcation from [employer] that pt has been approved for [facility] assistance.  Pt ID: [id number] Approved until 05/06/2019  CSW informed pt and provided with instructions on how to contact [employer] to request refill- pt expressed understanding  Jorge Ny, LCSW Clinical Social Worker [facility] [phone]

## 2018-06-21 ENCOUNTER — Other Ambulatory Visit: Payer: Self-pay | Admitting: Cardiology

## 2018-08-11 ENCOUNTER — Telehealth: Payer: Self-pay | Admitting: Cardiovascular Disease

## 2018-08-11 NOTE — Telephone Encounter (Signed)
My Chart message sent

## 2018-12-20 ENCOUNTER — Other Ambulatory Visit: Payer: Self-pay | Admitting: Cardiology

## 2018-12-20 NOTE — Telephone Encounter (Signed)
Patient of Dr [name]  Thank you, Elmyra Ricks

## 2019-01-19 ENCOUNTER — Other Ambulatory Visit (HOSPITAL_COMMUNITY): Payer: Self-pay | Admitting: Cardiology

## 2019-02-07 ENCOUNTER — Other Ambulatory Visit (HOSPITAL_COMMUNITY): Payer: Self-pay

## 2019-02-07 MED ORDER — ENTRESTO 97-103 MG PO TABS: 1.0000 | ORAL_TABLET | Freq: Two times a day (BID) | ORAL | 11 refills | Status: DC

## 2019-02-10 ENCOUNTER — Other Ambulatory Visit (HOSPITAL_COMMUNITY): Payer: Self-pay

## 2019-02-10 MED ORDER — ENTRESTO 97-103 MG PO TABS: 1.0000 | ORAL_TABLET | Freq: Two times a day (BID) | ORAL | 2 refills | Status: DC

## 2019-02-16 ENCOUNTER — Other Ambulatory Visit (HOSPITAL_COMMUNITY): Payer: Self-pay | Admitting: Cardiology

## 2019-03-16 ENCOUNTER — Other Ambulatory Visit: Payer: Self-pay

## 2019-03-16 DIAGNOSIS — Z20822 Contact with and (suspected) exposure to covid-19: Secondary | ICD-10-CM

## 2019-03-17 LAB — NOVEL CORONAVIRUS, NAA: SARS-CoV-2, NAA: NOT DETECTED

## 2019-03-19 ENCOUNTER — Other Ambulatory Visit (HOSPITAL_COMMUNITY): Payer: Self-pay | Admitting: Cardiology

## 2019-03-24 ENCOUNTER — Telehealth (HOSPITAL_COMMUNITY): Payer: Self-pay | Admitting: Pharmacy Technician

## 2019-03-24 NOTE — Telephone Encounter (Signed)
Spoke to patient, will mail him the patient portion of the application for him to sign on Monday of next week.  Will follow up.  Charlann Boxer, CPhT

## 2019-03-24 NOTE — Telephone Encounter (Signed)
It's time to re-enroll patient to receive medication assistance for Entresto from [employer]. Left voicemail for patient to call me back so that we can start the re-enrollment process.  Will follow up.  Charlann Boxer, CPhT

## 2019-04-14 ENCOUNTER — Other Ambulatory Visit (HOSPITAL_COMMUNITY): Payer: Self-pay

## 2019-04-14 ENCOUNTER — Telehealth (HOSPITAL_COMMUNITY): Payer: Self-pay | Admitting: Licensed Clinical Social Worker

## 2019-04-14 MED ORDER — ENTRESTO 97-103 MG PO TABS: 1.0000 | ORAL_TABLET | Freq: Two times a day (BID) | ORAL | 3 refills | Status: DC

## 2019-04-14 NOTE — Telephone Encounter (Signed)
CSW faxed completed [employer] application for Entresto assistance in for review- fax confirmation received  Kellan Boehlke H. Chemeka Filice, LCSW Clinical Social Worker [facility] [phone]   

## 2019-04-18 ENCOUNTER — Other Ambulatory Visit: Payer: Self-pay | Admitting: Internal Medicine

## 2019-05-12 NOTE — Telephone Encounter (Signed)
Advanced [facility] Failure Patient Advocate Encounter   Patient was approved to receive Entresto from [employer].  Patient ID: [id number]; good through 05/11/20  Effective dates: 05/12/2019 through 05/11/2020  Called and left patient voicemail with approval.  Charlann Boxer, CPhT

## 2019-05-19 ENCOUNTER — Other Ambulatory Visit: Payer: Self-pay | Admitting: Internal Medicine

## 2019-06-06 ENCOUNTER — Telehealth (HOSPITAL_COMMUNITY): Payer: Self-pay

## 2019-06-06 NOTE — Telephone Encounter (Signed)

## 2019-06-07 ENCOUNTER — Ambulatory Visit (HOSPITAL_COMMUNITY)
Admission: RE | Admit: 2019-06-07 | Discharge: 2019-06-07 | Disposition: A | Discharge status: Home / Self Care | Payer: 59 | Source: Ambulatory Visit | Attending: Cardiology | Admitting: Cardiology

## 2019-06-07 ENCOUNTER — Encounter (HOSPITAL_COMMUNITY): Payer: Self-pay | Admitting: Cardiology

## 2019-06-07 ENCOUNTER — Other Ambulatory Visit: Payer: Self-pay

## 2019-06-07 VITALS — BP 110/84 | HR 59 | Wt 291.8 lb

## 2019-06-07 DIAGNOSIS — G4733 Obstructive sleep apnea (adult) (pediatric): Secondary | ICD-10-CM | POA: Insufficient documentation

## 2019-06-07 DIAGNOSIS — Z6841 Body Mass Index (BMI) 40.0 and over, adult: Secondary | ICD-10-CM | POA: Diagnosis not present

## 2019-06-07 DIAGNOSIS — I447 Left bundle-branch block, unspecified: Secondary | ICD-10-CM | POA: Diagnosis not present

## 2019-06-07 DIAGNOSIS — Z79899 Other long term (current) drug therapy: Secondary | ICD-10-CM | POA: Diagnosis not present

## 2019-06-07 DIAGNOSIS — I110 Hypertensive heart disease with heart failure: Secondary | ICD-10-CM | POA: Insufficient documentation

## 2019-06-07 DIAGNOSIS — Z87891 Personal history of nicotine dependence: Secondary | ICD-10-CM | POA: Diagnosis not present

## 2019-06-07 DIAGNOSIS — I5022 Chronic systolic (congestive) heart failure: Secondary | ICD-10-CM | POA: Insufficient documentation

## 2019-06-07 DIAGNOSIS — K219 Gastro-esophageal reflux disease without esophagitis: Secondary | ICD-10-CM | POA: Diagnosis not present

## 2019-06-07 DIAGNOSIS — I428 Other cardiomyopathies: Secondary | ICD-10-CM | POA: Diagnosis not present

## 2019-06-07 LAB — COMPREHENSIVE METABOLIC PANEL
ALT: 42 U/L (ref 0–44)
AST: 33 U/L (ref 15–41)
Albumin: 4.1 g/dL (ref 3.5–5.0)
Alkaline Phosphatase: 45 U/L (ref 38–126)
Anion gap: 10 (ref 5–15)
BUN: 14 mg/dL (ref 6–20)
CO2: 22 mmol/L (ref 22–32)
Calcium: 9.1 mg/dL (ref 8.9–10.3)
Chloride: 102 mmol/L (ref 98–111)
Creatinine, Ser: 1.28 mg/dL — ABNORMAL HIGH (ref 0.61–1.24)
GFR calc Af Amer: >60 mL/min (ref >60)
GFR calc non Af Amer: >60 mL/min (ref >60)
Glucose, Bld: 109 mg/dL — ABNORMAL HIGH (ref 70–99)
Potassium: 4.2 mmol/L (ref 3.5–5.1)
Sodium: 134 mmol/L — ABNORMAL LOW (ref 135–145)
Total Bilirubin: 0.6 mg/dL (ref 0.3–1.2)
Total Protein: 7.2 g/dL (ref 6.5–8.1)

## 2019-06-07 LAB — LIPID PANEL
Cholesterol: 118 mg/dL (ref 0–200)
HDL: 28 mg/dL — ABNORMAL LOW (ref >40)
LDL Cholesterol: 57 mg/dL (ref 0–99)
Total CHOL/HDL Ratio: 4.2 RATIO
Triglycerides: 164 mg/dL — ABNORMAL HIGH (ref <150)
VLDL: 33 mg/dL (ref 0–40)

## 2019-06-07 LAB — CBC
HCT: 39.8 % (ref 39.0–52.0)
Hemoglobin: 12.7 g/dL — ABNORMAL LOW (ref 13.0–17.0)
MCH: 32.5 pg (ref 26.0–34.0)
MCHC: 31.9 g/dL (ref 30.0–36.0)
MCV: 101.8 fL — ABNORMAL HIGH (ref 80.0–100.0)
Platelets: 202 10*3/uL (ref 150–400)
RBC: 3.91 MIL/uL — ABNORMAL LOW (ref 4.22–5.81)
RDW: 12.5 % (ref 11.5–15.5)
WBC: 6.1 10*3/uL (ref 4.0–10.5)
nRBC: 0 % (ref 0.0–0.2)

## 2019-06-07 LAB — HEMOGLOBIN A1C
Hgb A1c MFr Bld: 5.8 % — ABNORMAL HIGH (ref 4.8–5.6)
Mean Plasma Glucose: 119.76 mg/dL

## 2019-06-07 NOTE — Patient Instructions (Signed)
Labs today We will only contact you if something comes back abnormal or we need to make some changes. Otherwise no news is good news!   Your physician has requested that you have an echocardiogram. Echocardiography is a painless test that uses sound waves to create images of your heart. It provides your doctor with information about the size and shape of your heart and how well your heart's chambers and valves are working. This procedure takes approximately one hour. There are no restrictions for this procedure.   Your physician recommends that you schedule a follow-up appointment in: 2 months with Dr [name]   Please call office at [phone] option 2 if you have any questions or concerns.    At the [facility], you and your health needs are our priority. As part of our continuing mission to provide you with exceptional heart care, we have created designated Provider Care Teams. These Care Teams include your primary Cardiologist (physician) and Advanced Practice Providers (APPs- Physician Assistants and Nurse Practitioners) who all work together to provide you with the care you need, when you need it.   You may see any of the following providers on your designated Care Team at your next follow up: Marland Kitchen Dr Glori Bickers . Dr [name] . Darrick Grinder, NP . Lyda Jester, PA . Audry Riles, PharmD   Please be sure to bring in all your medications bottles to every appointment.

## 2019-06-07 NOTE — Progress Notes (Signed)
Advanced [facility] Failure Clinic Note   PCP: [facility] PCP-Cardiologist: Minus Breeding, MD  HF Cardiology: Dr. [name]  HPI:  Nicholas Gray is a 59 y.o. male with chronic systolic CHF, NICM (Normal coronaries 2010), HTN, LBBB, Obesity, and OSA on CPAP.  He has been known to have a cardiomyopathy for the last 10 years or so.   Seen in [facility] office 08/13/17 and was overall feeling well. No new symptoms.  Echo 08/27/17 LVEF 25-30%, Grade 1 DD, Mild MR, Mild LAE, mild RV dilation, PA peak pressure 34 mm Hg.  Referred by Dr. Percival Spanish to CHF team for further evaluation and treatment of CHF.    Pt admitted 5/30 - 09/13/2017 with Chest/upper abdominal discomfort and pain. CT scan and HIDA consistent with acute cholecystitis. Underwent laproscopic chole on 09/07/17 and found to have gangrenous GB. 09/09/17 repeat HIDA scan showed bile leak. ERCP 09/11/17, but patient had failed stent placement. Considered for IR for drain placement, but decided on conservative management.  He was admitted again with RUQ pain in 7/19, had ERCP with bile leak and choledocholithiasis noted.  He had stone removal and stent placed.   LHC/RHC in 7/19 showed no significant CAD, optimized filling pressures and preserved cardiac output. Cardiac MRI in 11/19 showed improvement in LV EF to 51% with mildly decreased RV systolic function, no LGE.   He returns for followup of CHF.  I have not seen him in about a year.  He lost his job and lost his health insurance.  Just recently started a new job with [employer].  He has continued to take all his meds.  He has had significant weight gain that he attributes to not watching his diet and minimal exercise.  No significant dyspnea walking on flat ground.  He gets short of breath walking up hills and stairs.  No orthopnea/PND.  No chest pain.  No lightheadedness.  He is using his CPAP.   ECG (personally reviewed): sinus brady 54, LBBB 156 msec  Labs (6/19): K 4.2,  creatinine 1.14 Labs (8/19): K 3.9, creatinine 1.14, normal LFTs Labs (01/05/2018): K 4.5 creatinine 1.23  Labs (11/19): K 3.9, creatinine 1.16 Labs (1/20): K 3.9, creatinine 1.13  Social History: Lives in [place], [place]. 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 Denies heavy ETOH use.  Family history: He denies immediate family history of CHF, but thinks his maternal grandparents may have had "heart problems"   Review of systems complete and found to be negative unless listed in HPI.    Past Medical History: 1. Chronic systolic CHF: Suspected nonischemic cardiomyopathy (normal coronaries 2010).  - Echo 2012 with EF 30% - Echo 12/15 with EF 40-45% - Echo 2017 with EF 35-40% - Echo 08/27/17 LVEF 25-30%, Grade 1 DD, Mild MR, Mild LAE, mild RV dilation, PA peak pressure 34 mm Hg.  - LHC/RHC (7/19): No significant coronary disease. RA 7, PA 39/12 mean 25, PCWP mean 11, CI 3.68, PVR 1.65 WU.  - Cardiac MRI (11/19): LV EF 51% with septal-lateral dyssynchrony, mild RV dilation with RV EF 39%, no LGE.  2. HTN 3. LBBB, chronic - QRS 166 on EKG 09/04/17 4. Obesity Body mass index is 41.87 kg/m. 5. OSA: Reports compliance with his CPAP.  6. GERD 7. Cholecystitis: Underwent laproscopic chole on 09/07/17 and found to have gangrenous GB.  - 09/09/17 repeat HIDA scan showed bile leak.  - ERCP 09/11/17, but patient had failed stent  placement. - ERCP 7/19 with sphincterotomy, stone removal, stent placement.   Current Outpatient Medications  Medication Sig Dispense Refill  . allopurinol (ZYLOPRIM) 100 MG tablet Take 100 mg by mouth 2 (two) times daily.     . carvedilol (COREG) 25 MG tablet TAKE (1) TABLET BY MOUTH TWICE DAILY WITH A MEAL. 60 tablet 5  . Famotidine (PEPCID PO) Take by mouth daily.    . furosemide (LASIX) 20 MG tablet Take 1 tablet (20 mg total) by mouth as needed for fluid or edema. 30 tablet 3  . sacubitril-valsartan (ENTRESTO) 97-103 MG Take 1 tablet by  mouth 2 (two) times daily. 180 tablet 3  . simvastatin (ZOCOR) 40 MG tablet Take 40 mg by mouth at bedtime.    Marland Kitchen spironolactone (ALDACTONE) 25 MG tablet TAKE 1 TABLET BY MOUTH DAILY. 30 tablet 5   No current facility-administered medications for this encounter.   No Known Allergies  Vitals:   06/07/19 0834  BP: 110/84  Pulse: (!) 59  SpO2: 95%  Weight: 132.4 kg (291 lb 12.8 oz)   Wt Readings from Last 3 Encounters:  06/07/19 132.4 kg (291 lb 12.8 oz)  04/13/18 121.1 kg (267 lb)  03/08/18 119.2 kg (262 lb 12.6 oz)    PHYSICAL EXAM: General: NAD Neck: No JVD, no thyromegaly or thyroid nodule.  Lungs: Clear to auscultation bilaterally with normal respiratory effort. CV: Nondisplaced PMI.  Heart regular S1/S2, no S3/S4, no murmur.  No peripheral edema.  No carotid bruit.  Normal pedal pulses.  Abdomen: Soft, nontender, no hepatosplenomegaly, no distention.  Skin: Intact without lesions or rashes.  Neurologic: Alert and oriented x 3.  Psych: Normal affect. Extremities: No clubbing or cyanosis.  HEENT: Normal.   ASSESSMENT & PLAN:  1. Chronic systolic CHF: Nonischemic cardiomyopathy by coronary angiography in 2010 and 7/19.  He has had a cardiomyopathy for at least the last 10 years.  Recent echo in 5/19 showed a fall in EF to the 25-30% range.  He does not have an ICD as prior echoes had shown EF in the 40% range.  It is possible that he has a LBBB cardiomyopathy.  Prior viral myocarditis is also a possibility. No significant FH of CHF and no heavy ETOH/substance abuse.  Cardiac MRI in 11/19 showed LV EF up to 51%, no LGE. He is not volume overloaded.  NYHA class II symptoms. I think his weight gain is caloric rather than fluid-related.  - He does not appear to need standing Lasix.  - Continue current Entresto, spironolactone, and Coreg (all at goal).  - BMET today.  He should have a BMET every 3 months on his current regimen.  - I will obtain a repeat echo at followup in 2 months.     2. HTN: BP controlled.  3. LBBB: Chronic. EF improved to 51% on 11/19 cMRI so out of range for CRT-D.  4. Obesity: Body mass index is 41.87 kg/m.  - Encouraged weight loss 5. OSA: Continue CPAP.   Followup in 2 months with echo.   [name] 06/07/2019

## 2019-08-10 ENCOUNTER — Other Ambulatory Visit: Payer: Self-pay

## 2019-08-10 ENCOUNTER — Ambulatory Visit (HOSPITAL_COMMUNITY)
Admission: RE | Admit: 2019-08-10 | Discharge: 2019-08-10 | Disposition: A | Discharge status: Home / Self Care | Payer: 59 | Source: Ambulatory Visit | Attending: Cardiology | Admitting: Cardiology

## 2019-08-10 ENCOUNTER — Ambulatory Visit (HOSPITAL_BASED_OUTPATIENT_CLINIC_OR_DEPARTMENT_OTHER)
Admission: RE | Admit: 2019-08-10 | Discharge: 2019-08-10 | Disposition: A | Discharge status: Home / Self Care | Payer: 59 | Source: Ambulatory Visit | Attending: Cardiology | Admitting: Cardiology

## 2019-08-10 VITALS — BP 108/57 | HR 54 | Wt 291.0 lb

## 2019-08-10 DIAGNOSIS — E669 Obesity, unspecified: Secondary | ICD-10-CM | POA: Insufficient documentation

## 2019-08-10 DIAGNOSIS — Z9049 Acquired absence of other specified parts of digestive tract: Secondary | ICD-10-CM | POA: Diagnosis not present

## 2019-08-10 DIAGNOSIS — I110 Hypertensive heart disease with heart failure: Secondary | ICD-10-CM | POA: Diagnosis not present

## 2019-08-10 DIAGNOSIS — G4733 Obstructive sleep apnea (adult) (pediatric): Secondary | ICD-10-CM | POA: Diagnosis not present

## 2019-08-10 DIAGNOSIS — Z79899 Other long term (current) drug therapy: Secondary | ICD-10-CM | POA: Insufficient documentation

## 2019-08-10 DIAGNOSIS — I5022 Chronic systolic (congestive) heart failure: Secondary | ICD-10-CM

## 2019-08-10 DIAGNOSIS — I447 Left bundle-branch block, unspecified: Secondary | ICD-10-CM

## 2019-08-10 DIAGNOSIS — Z6841 Body Mass Index (BMI) 40.0 and over, adult: Secondary | ICD-10-CM | POA: Diagnosis not present

## 2019-08-10 DIAGNOSIS — K219 Gastro-esophageal reflux disease without esophagitis: Secondary | ICD-10-CM | POA: Insufficient documentation

## 2019-08-10 DIAGNOSIS — Z7189 Other specified counseling: Secondary | ICD-10-CM

## 2019-08-10 DIAGNOSIS — I428 Other cardiomyopathies: Secondary | ICD-10-CM | POA: Insufficient documentation

## 2019-08-10 DIAGNOSIS — Z87891 Personal history of nicotine dependence: Secondary | ICD-10-CM | POA: Insufficient documentation

## 2019-08-10 LAB — BASIC METABOLIC PANEL
Anion gap: 8 (ref 5–15)
BUN: 11 mg/dL (ref 6–20)
CO2: 23 mmol/L (ref 22–32)
Calcium: 8.7 mg/dL — ABNORMAL LOW (ref 8.9–10.3)
Chloride: 104 mmol/L (ref 98–111)
Creatinine, Ser: 1.34 mg/dL — ABNORMAL HIGH (ref 0.61–1.24)
GFR calc Af Amer: >60 mL/min (ref >60)
GFR calc non Af Amer: 57 mL/min — ABNORMAL LOW (ref >60)
Glucose, Bld: 114 mg/dL — ABNORMAL HIGH (ref 70–99)
Potassium: 4.3 mmol/L (ref 3.5–5.1)
Sodium: 135 mmol/L (ref 135–145)

## 2019-08-10 MED ORDER — FARXIGA 10 MG PO TABS: 10.0000 mg | ORAL_TABLET | Freq: Every day | ORAL | 5 refills | Status: DC

## 2019-08-10 NOTE — Progress Notes (Signed)
Advanced Heart Failure Clinic Note   PCP: [facility] PCP-Cardiologist: Minus Breeding, MD  HF Cardiology: Dr. [name]  HPI:  Nicholas Gray is a 59 y.o. male with chronic systolic CHF, NICM (Normal coronaries 2010), HTN, LBBB, Obesity, and OSA on CPAP.  He has been known to have a cardiomyopathy for the last 10 years or so.   Seen in [facility] office 08/13/17 and was overall feeling well. No new symptoms.  Echo 08/27/17 LVEF 25-30%, Grade 1 DD, Mild MR, Mild LAE, mild RV dilation, PA peak pressure 34 mm Hg.  Referred by Dr. Percival Spanish to CHF team for further evaluation and treatment of CHF.    Pt admitted 5/30 - 09/13/2017 with Chest/upper abdominal discomfort and pain. CT scan and HIDA consistent with acute cholecystitis. Underwent laproscopic chole on 09/07/17 and found to have gangrenous GB. 09/09/17 repeat HIDA scan showed bile leak. ERCP 09/11/17, but patient had failed stent placement. Considered for IR for drain placement, but decided on conservative management.  He was admitted again with RUQ pain in 7/19, had ERCP with bile leak and choledocholithiasis noted.  He had stone removal and stent placed.   LHC/RHC in 7/19 showed no significant CAD, optimized filling pressures and preserved cardiac output. Cardiac MRI in 11/19 showed improvement in LV EF to 51% with mildly decreased RV systolic function, no LGE.   Echo was done today and reviewed, EF appears to be in the range of 40% though it is a technically difficult study.  There is septal-lateral dyssynchrony.   He returns for followup of CHF.  Weight is stable.  No dyspnea walking short distances on flat ground.  He does note shortness of breath walking in the woods and over uneven ground when he goes hunting.  No orthopnea/PND.  No lightheadedness.  No chest pain.    ECG (personally reviewed): NSR, LBBB 158 msec  Labs (6/19): K 4.2, creatinine 1.14 Labs (8/19): K 3.9, creatinine 1.14, normal LFTs Labs (01/05/2018): K 4.5  creatinine 1.23  Labs (11/19): K 3.9, creatinine 1.16 Labs (1/20): K 3.9, creatinine 1.13 Labs (3/21): K 4.2, creatinine 1.28, hgb 13.7, LDL 57, HDL 28  Social History: Lives in [place], [place]. 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 Denies heavy ETOH use.  Family history: He denies immediate family history of CHF, but thinks his maternal grandparents may have had "heart problems"   Review of systems complete and found to be negative unless listed in HPI.    Past Medical History: 1. Chronic systolic CHF: Suspected nonischemic cardiomyopathy (normal coronaries 2010).  - Echo 2012 with EF 30% - Echo 12/15 with EF 40-45% - Echo 2017 with EF 35-40% - Echo 08/27/17 LVEF 25-30%, Grade 1 DD, Mild MR, Mild LAE, mild RV dilation, PA peak pressure 34 mm Hg.  - LHC/RHC (7/19): No significant coronary disease. RA 7, PA 39/12 mean 25, PCWP mean 11, CI 3.68, PVR 1.65 WU.  - Cardiac MRI (11/19): LV EF 51% with septal-lateral dyssynchrony, mild RV dilation with RV EF 39%, no LGE.  - Echo (5/21): Technically difficult, EF around 40% with diffuse hypokinesis and septal-lateral dyssynchrony, normal RV.  2. HTN 3. LBBB, chronic - QRS 166 on EKG 09/04/17 4. Obesity Body mass index is 41.75 kg/m. 5. OSA: Reports compliance with his CPAP.  6. GERD 7. Cholecystitis: Underwent laproscopic chole on 09/07/17 and found to have gangrenous GB.  - 09/09/17 repeat HIDA scan showed bile leak.  -  ERCP 09/11/17, but patient had failed stent placement. - ERCP 7/19 with sphincterotomy, stone removal, stent placement.   Current Outpatient Medications  Medication Sig Dispense Refill  . allopurinol (ZYLOPRIM) 100 MG tablet Take 100 mg by mouth 2 (two) times daily.     . carvedilol (COREG) 25 MG tablet TAKE (1) TABLET BY MOUTH TWICE DAILY WITH A MEAL. 60 tablet 5  . Famotidine (PEPCID PO) Take by mouth daily.    . furosemide (LASIX) 20 MG tablet Take 1 tablet (20 mg total) by mouth as needed  for fluid or edema. 30 tablet 3  . sacubitril-valsartan (ENTRESTO) 97-103 MG Take 1 tablet by mouth 2 (two) times daily. 180 tablet 3  . simvastatin (ZOCOR) 40 MG tablet Take 40 mg by mouth at bedtime.    Marland Kitchen spironolactone (ALDACTONE) 25 MG tablet TAKE 1 TABLET BY MOUTH DAILY. 30 tablet 5  . dapagliflozin propanediol (FARXIGA) 10 MG TABS tablet Take 10 mg by mouth daily before breakfast. 30 tablet 5   No current facility-administered medications for this encounter.   No Known Allergies  Vitals:   08/10/19 0852  BP: (!) 108/57  Pulse: (!) 54  SpO2: 96%  Weight: 132 kg (291 lb)   Wt Readings from Last 3 Encounters:  08/10/19 132 kg (291 lb)  06/07/19 132.4 kg (291 lb 12.8 oz)  04/13/18 121.1 kg (267 lb)    PHYSICAL EXAM: General: NAD, obese.  Neck: No JVD, no thyromegaly or thyroid nodule.  Lungs: Clear to auscultation bilaterally with normal respiratory effort. CV: Nondisplaced PMI.  Heart regular S1/S2, no S3/S4, no murmur.  No peripheral edema.  No carotid bruit.  Normal pedal pulses.  Abdomen: Soft, nontender, no hepatosplenomegaly, no distention.  Skin: Intact without lesions or rashes.  Neurologic: Alert and oriented x 3.  Psych: Normal affect. Extremities: No clubbing or cyanosis.  HEENT: Normal.   ASSESSMENT & PLAN:  1. Chronic systolic CHF: Nonischemic cardiomyopathy by coronary angiography in 2010 and 7/19.  He has had a cardiomyopathy for at least the last 10 years.  Echo in 5/19 showed a fall in EF to the 25-30% range.  He does not have an ICD as prior echoes had shown EF in the 40% range.  It is possible that he has a LBBB cardiomyopathy.  Prior viral myocarditis is also a possibility. No significant FH of CHF and no heavy ETOH/substance abuse.  Cardiac MRI in 11/19 showed LV EF up to 51%, no LGE.  Echo was done today, difficult study but EF appears to be around 40% with prominent septal-lateral dyssynchrony.  He is not volume overloaded.  NYHA class II symptoms.   - He  does not appear to need standing Lasix.  - Continue current Entresto, spironolactone, and Coreg (all at goal).  - Add dapagliflozin 10 mg daily.  BMET today and in 10 days.  - Given wide LBBB and prominent dyssynchrony, I would like a better quantification of his EF as he would likely benefit from CRT with EF<35%.  Today's echo was technically difficult.  I will arrange for cardiac MRI to quantify EF more exactly.    2. HTN: BP controlled.  3. LBBB: Chronic.  4. Obesity: Body mass index is 41.75 kg/m.  - Encouraged weight loss by diet/exercise.  5. OSA: Continue CPAP.   Followup in 3 months.   [name] 08/10/2019

## 2019-08-10 NOTE — Patient Instructions (Signed)
Labs done today. We will contact you only if your labs are abnormal.  START Farxiga 10mg (1 tablet) by mouth daily.  No other medication changes were made. Please continue all current medications as prescribed.   Your physician recommends that you schedule a follow-up appointment in: 10 days for a lab only appointment and in 3 months for an appointment with Dr. [name].  Your physician has requested that you have a cardiac MRI. Cardiac MRI uses a computer to create images of your heart as its beating, producing both still and moving pictures of your heart and major blood vessels. For further information please visit [email or web address] . This test must be approved by your insurance company prior to scheduling. We will contact you at a later date to schedule an appointment for this test.  At the [facility], you and your health needs are our priority. As part of our continuing mission to provide you with exceptional heart care, we have created designated Provider Care Teams. These Care Teams include your primary Cardiologist (physician) and Advanced Practice Providers (APPs- Physician Assistants and Nurse Practitioners) who all work together to provide you with the care you need, when you need it.   You may see any of the following providers on your designated Care Team at your next follow up: Marland Kitchen Dr Glori Bickers . Dr [name] . Darrick Grinder, NP . Lyda Jester, PA . Audry Riles, PharmD   Please be sure to bring in all your medications bottles to every appointment.

## 2019-08-10 NOTE — Progress Notes (Signed)
Echocardiogram 2D Echocardiogram has been performed.  Oneal Deputy Lyndy Russman 08/10/2019, 8:38 AM

## 2019-08-11 ENCOUNTER — Telehealth (HOSPITAL_COMMUNITY): Payer: Self-pay | Admitting: Pharmacist

## 2019-08-11 NOTE — Telephone Encounter (Signed)
Patient Advocate Encounter   Received notification from [employer] that prior authorization for [name] is required.   PA submitted on CoverMyMeds Key [id number] Status is pending   Will continue to follow.  Audry Riles, PharmD, BCPS, BCCP, CPP Heart Failure Clinic Pharmacist [phone]

## 2019-08-12 NOTE — Telephone Encounter (Signed)
Advanced Heart Failure Patient Advocate Encounter  Prior Authorization for [name] has been approved.    Effective dates: 08/11/19 through 08/10/20  Audry Riles, PharmD, BCPS, BCCP, CPP Heart Failure Clinic Pharmacist [phone]

## 2019-08-14 IMAGING — DX DG CHEST 2V
2 series · 2 of 2 positions shown · non-contrast
Comparison: 03/12/2009

CLINICAL DATA: Chest pain and shortness of Breath

EXAM:
CHEST - 2 VIEW

[x chest ap]
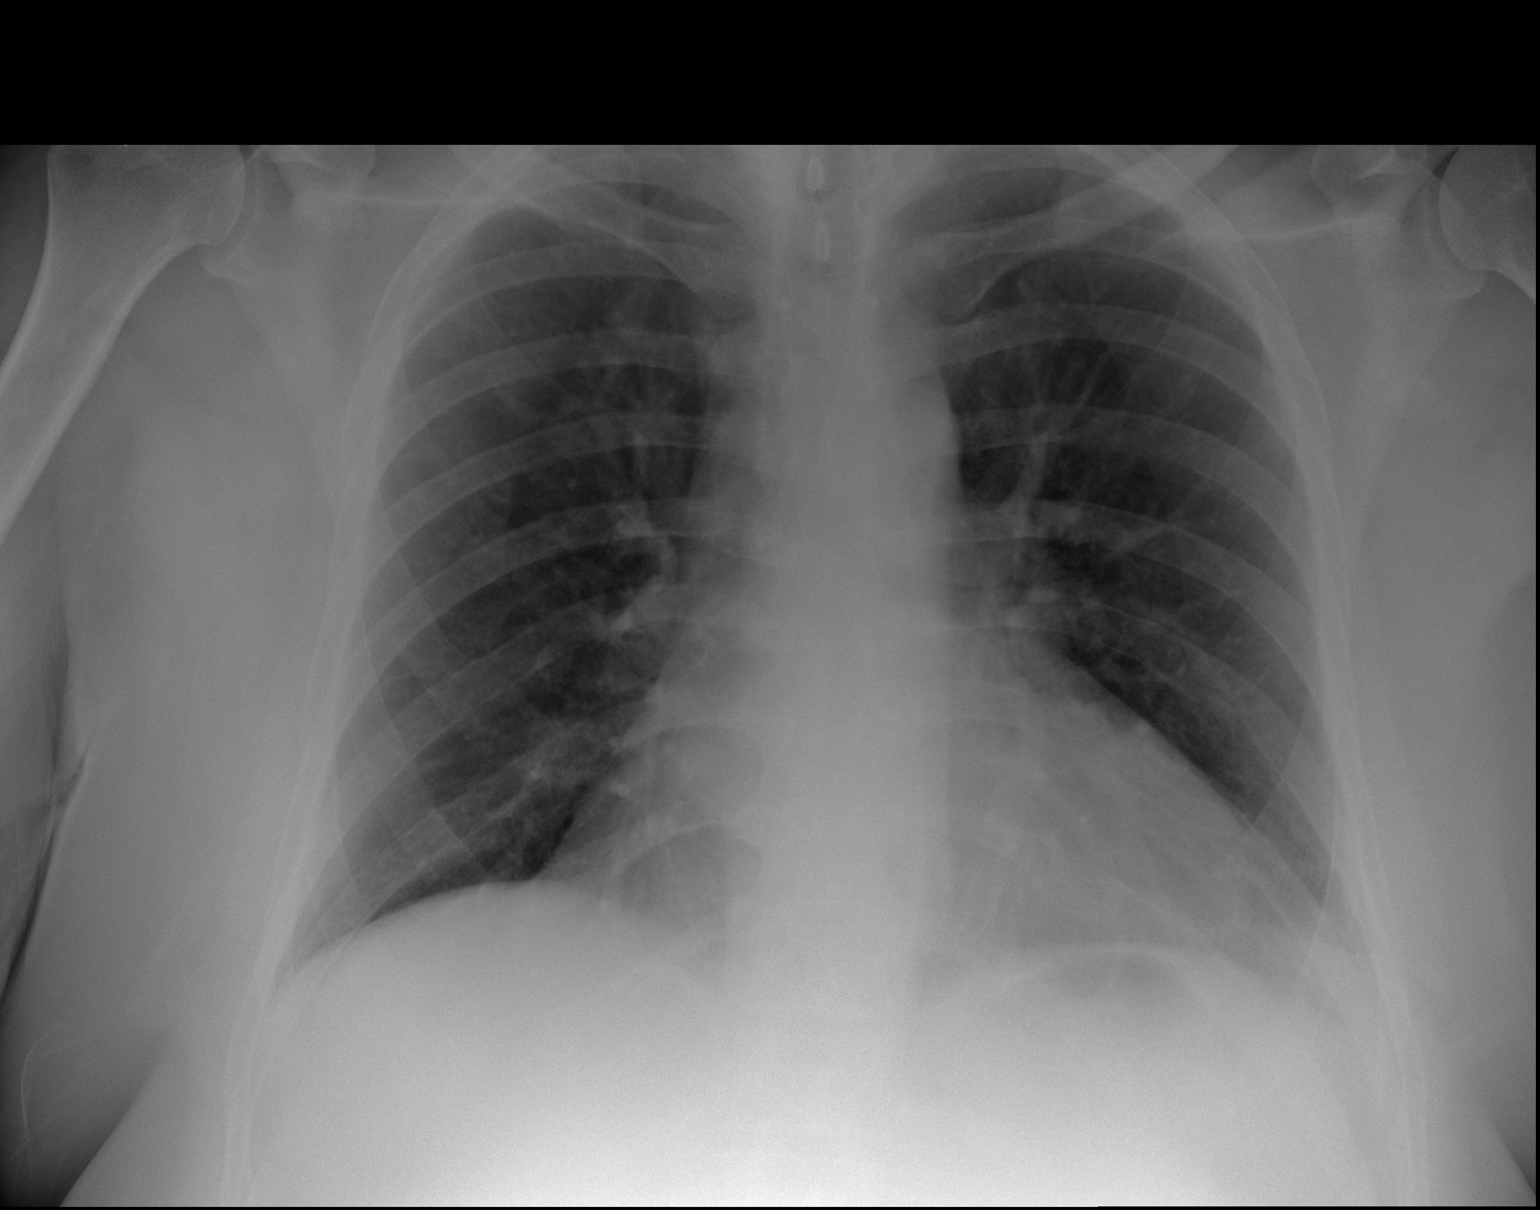

[w chest lat]
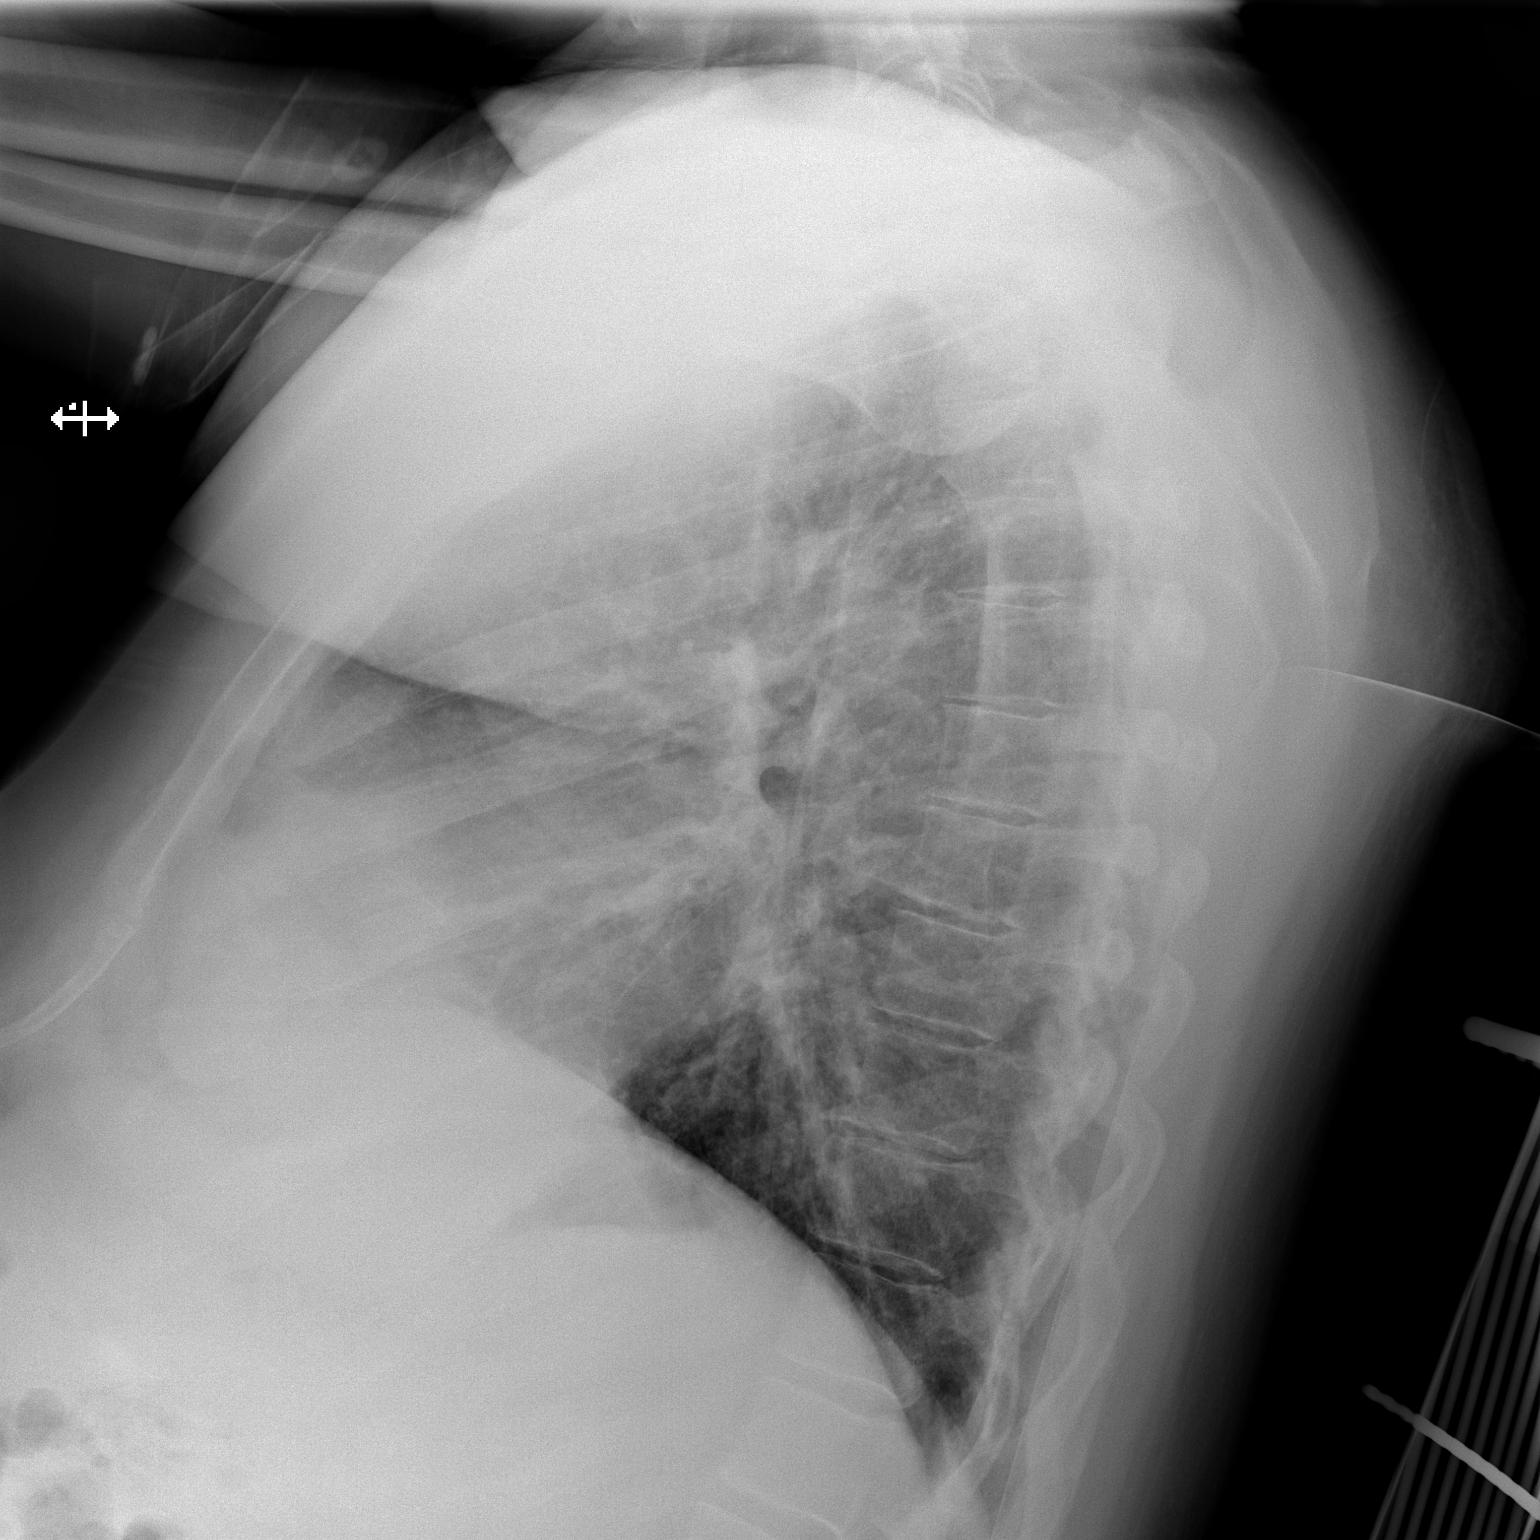

[2 of 2 positions shown; findings below may reference images not displayed]

FINDINGS: Cardiac shadow is mildly enlarged. Minimal left basilar atelectasis
is seen in the lingula. No sizable effusion is noted. No acute bony
abnormality is seen.
IMPRESSION: Mild left basilar atelectasis.

## 2019-08-17 IMAGING — NM NM HEPATO W/GB/PHARM/[PERSON_NAME]
3 series · 18 of 18 positions shown · non-contrast
Comparison: CT abdomen dated 09/05/2017.

CLINICAL DATA: RIGHT upper quadrant pain.

EXAM:
NUCLEAR MEDICINE HEPATOBILIARY IMAGING
TECHNIQUE: Sequential images of the abdomen were obtained [DATE] minutes
following intravenous administration of radiopharmaceutical.
RADIOPHARMACEUTICALS:  5.2 mCi Gc-GGm  Choletec IV and 3 mg morphine

[he hepatobiliary · 3.10mm/px · 6 of 60 frames shown (1 of 3)]
[frame 6/60]
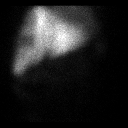
[frame 16/60]
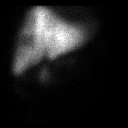
[frame 26/60]
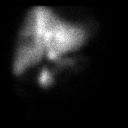
[frame 36/60]
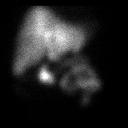
[frame 46/60]
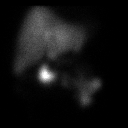
[frame 56/60]
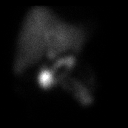

[he hepatobiliary · 3.10mm/px · 6 of 30 frames shown (2 of 3)]
[frame 3/30]
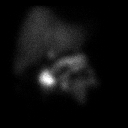
[frame 8/30]
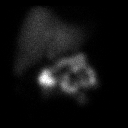
[frame 13/30]
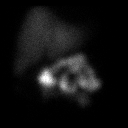
[frame 18/30]
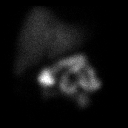
[frame 23/30]
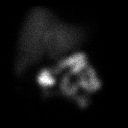
[frame 28/30]
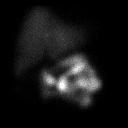

[he hepatobiliary · 3.10mm/px · 6 of 29 frames shown (3 of 3)]
[frame 3/29]
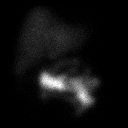
[frame 7/29]
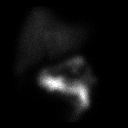
[frame 12/29]
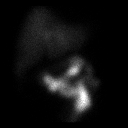
[frame 17/29]
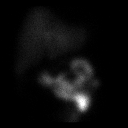
[frame 22/29]
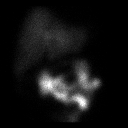
[frame 27/29]
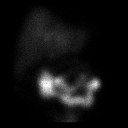

[18 of 18 positions shown; findings below may reference images not displayed]

FINDINGS: Prompt uptake and biliary excretion of activity by the liver is
seen. Biliary activity passes into small bowel, consistent with
patent common bile duct. No gallbladder activity visualized
indicating acute cholecystitis.
IMPRESSION: Findings are consistent with acute cholecystitis.

## 2019-08-22 ENCOUNTER — Ambulatory Visit (HOSPITAL_COMMUNITY)
Admission: RE | Admit: 2019-08-22 | Discharge: 2019-08-22 | Disposition: A | Discharge status: Home / Self Care | Payer: 59 | Source: Ambulatory Visit | Attending: Internal Medicine | Admitting: Internal Medicine

## 2019-08-22 ENCOUNTER — Other Ambulatory Visit: Payer: Self-pay

## 2019-08-22 DIAGNOSIS — I5022 Chronic systolic (congestive) heart failure: Secondary | ICD-10-CM | POA: Insufficient documentation

## 2019-08-22 LAB — BASIC METABOLIC PANEL
Anion gap: 8 (ref 5–15)
BUN: 16 mg/dL (ref 6–20)
CO2: 24 mmol/L (ref 22–32)
Calcium: 8.8 mg/dL — ABNORMAL LOW (ref 8.9–10.3)
Chloride: 103 mmol/L (ref 98–111)
Creatinine, Ser: 1.33 mg/dL — ABNORMAL HIGH (ref 0.61–1.24)
GFR calc Af Amer: >60 mL/min (ref >60)
GFR calc non Af Amer: 58 mL/min — ABNORMAL LOW (ref >60)
Glucose, Bld: 115 mg/dL — ABNORMAL HIGH (ref 70–99)
Potassium: 4.2 mmol/L (ref 3.5–5.1)
Sodium: 135 mmol/L (ref 135–145)

## 2019-09-07 ENCOUNTER — Encounter (HOSPITAL_COMMUNITY): Payer: Self-pay

## 2019-09-08 IMAGING — NM NM HEPATOBILIARY IMAGE, INC GB
1 series · 6 of 6 positions shown · non-contrast
Comparison: September 09, 2017

CLINICAL DATA: Evaluate for biliary leak.

EXAM:
NUCLEAR MEDICINE HEPATOBILIARY IMAGING
TECHNIQUE: Sequential images of the abdomen were obtained [DATE] minutes
following intravenous administration of radiopharmaceutical.
RADIOPHARMACEUTICALS:  5.0 mCi Ac-NNm  Choletec IV

[dy dynamic · 3.10mm/px · 6 of 58 frames shown]
[frame 5/58]
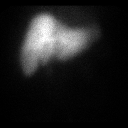
[frame 15/58]
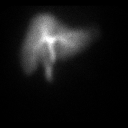
[frame 25/58]
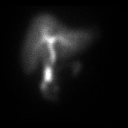
[frame 34/58]
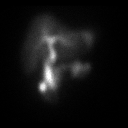
[frame 44/58]
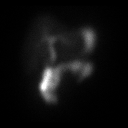
[frame 54/58]
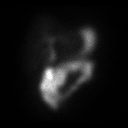

[6 of 6 positions shown; findings below may reference images not displayed]

FINDINGS: There is prompt uptake and excretion from the liver. The previously
identified biliary leak is resolved. Normal excretion into the
bowel.
IMPRESSION: Resolution of previous leak with no leak identified on today's
study.

## 2019-09-30 ENCOUNTER — Telehealth (HOSPITAL_COMMUNITY): Payer: Self-pay | Admitting: *Deleted

## 2019-09-30 NOTE — Telephone Encounter (Signed)
Reaching out to patient to offer assistance regarding upcoming cardiac imaging study; pt verbalizes understanding of appt date/time, parking situation and where to check in; name and call back number provided for further questions should they arise  Jakeline Dave Tai RN Navigator Cardiac Imaging [facility] [phone] office [phone] cell  

## 2019-10-03 ENCOUNTER — Other Ambulatory Visit: Payer: Self-pay

## 2019-10-03 ENCOUNTER — Ambulatory Visit (HOSPITAL_COMMUNITY)
Admission: RE | Admit: 2019-10-03 | Discharge: 2019-10-03 | Disposition: A | Discharge status: Home / Self Care | Payer: 59 | Source: Ambulatory Visit | Attending: Cardiology | Admitting: Cardiology

## 2019-10-03 DIAGNOSIS — I5022 Chronic systolic (congestive) heart failure: Secondary | ICD-10-CM

## 2019-10-03 DIAGNOSIS — Z7189 Other specified counseling: Secondary | ICD-10-CM | POA: Insufficient documentation

## 2019-10-03 MED ORDER — GADOBUTROL 1 MMOL/ML IV SOLN
10.0000 mL | Freq: Once | INTRAVENOUS | Status: AC | PRN
  Administered 2019-10-03: 10 mL via INTRAVENOUS

## 2019-10-19 ENCOUNTER — Other Ambulatory Visit: Payer: Self-pay | Admitting: Internal Medicine

## 2019-11-15 ENCOUNTER — Encounter (HOSPITAL_COMMUNITY): Payer: Self-pay | Admitting: Cardiology

## 2019-11-15 ENCOUNTER — Ambulatory Visit (HOSPITAL_COMMUNITY)
Admission: RE | Admit: 2019-11-15 | Discharge: 2019-11-15 | Disposition: A | Discharge status: Home / Self Care | Payer: 59 | Source: Ambulatory Visit | Attending: Cardiology | Admitting: Cardiology

## 2019-11-15 ENCOUNTER — Other Ambulatory Visit: Payer: Self-pay

## 2019-11-15 VITALS — BP 112/74 | HR 58 | Wt 284.2 lb

## 2019-11-15 DIAGNOSIS — G4733 Obstructive sleep apnea (adult) (pediatric): Secondary | ICD-10-CM | POA: Diagnosis not present

## 2019-11-15 DIAGNOSIS — I5022 Chronic systolic (congestive) heart failure: Secondary | ICD-10-CM | POA: Insufficient documentation

## 2019-11-15 DIAGNOSIS — Z6841 Body Mass Index (BMI) 40.0 and over, adult: Secondary | ICD-10-CM | POA: Insufficient documentation

## 2019-11-15 DIAGNOSIS — I110 Hypertensive heart disease with heart failure: Secondary | ICD-10-CM | POA: Diagnosis not present

## 2019-11-15 DIAGNOSIS — Z7901 Long term (current) use of anticoagulants: Secondary | ICD-10-CM | POA: Diagnosis not present

## 2019-11-15 DIAGNOSIS — Z79899 Other long term (current) drug therapy: Secondary | ICD-10-CM | POA: Diagnosis not present

## 2019-11-15 DIAGNOSIS — I447 Left bundle-branch block, unspecified: Secondary | ICD-10-CM | POA: Diagnosis not present

## 2019-11-15 DIAGNOSIS — K219 Gastro-esophageal reflux disease without esophagitis: Secondary | ICD-10-CM | POA: Insufficient documentation

## 2019-11-15 DIAGNOSIS — E669 Obesity, unspecified: Secondary | ICD-10-CM | POA: Insufficient documentation

## 2019-11-15 DIAGNOSIS — Z7984 Long term (current) use of oral hypoglycemic drugs: Secondary | ICD-10-CM | POA: Diagnosis not present

## 2019-11-15 DIAGNOSIS — I428 Other cardiomyopathies: Secondary | ICD-10-CM | POA: Diagnosis not present

## 2019-11-15 DIAGNOSIS — Z87891 Personal history of nicotine dependence: Secondary | ICD-10-CM | POA: Insufficient documentation

## 2019-11-15 LAB — BASIC METABOLIC PANEL
Anion gap: 11 (ref 5–15)
BUN: 22 mg/dL — ABNORMAL HIGH (ref 6–20)
CO2: 21 mmol/L — ABNORMAL LOW (ref 22–32)
Calcium: 9 mg/dL (ref 8.9–10.3)
Chloride: 101 mmol/L (ref 98–111)
Creatinine, Ser: 1.26 mg/dL — ABNORMAL HIGH (ref 0.61–1.24)
GFR calc Af Amer: >60 mL/min (ref >60)
GFR calc non Af Amer: >60 mL/min (ref >60)
Glucose, Bld: 110 mg/dL — ABNORMAL HIGH (ref 70–99)
Potassium: 4.2 mmol/L (ref 3.5–5.1)
Sodium: 133 mmol/L — ABNORMAL LOW (ref 135–145)

## 2019-11-15 NOTE — Patient Instructions (Signed)
Labs done today, your results will be available in MyChart, we will contact you for abnormal readings.  Your physician recommends that you return for lab work in: 3 months  Please call our office in January 2022 to schedule your follow up appointment  If you have any questions or concerns before your next appointment please send [place] a message through [place] or call our office at [phone].    TO LEAVE A MESSAGE FOR THE NURSE SELECT OPTION 2, PLEASE LEAVE A MESSAGE INCLUDING: . YOUR NAME . DATE OF BIRTH . CALL BACK NUMBER . REASON FOR CALL**this is important as we prioritize the call backs  [place] AS LONG AS YOU CALL BEFORE 4:00 PM  At the [facility], you and your health needs are our priority. As part of our continuing mission to provide you with exceptional heart care, we have created designated Provider Care Teams. These Care Teams include your primary Cardiologist (physician) and Advanced Practice Providers (APPs- Physician Assistants and Nurse Practitioners) who all work together to provide you with the care you need, when you need it.   You may see any of the following providers on your designated Care Team at your next follow up: Marland Kitchen Dr Glori Bickers . Dr [name] . Darrick Grinder, NP . Lyda Jester, PA . Audry Riles, PharmD   Please be sure to bring in all your medications bottles to every appointment.

## 2019-11-15 NOTE — Progress Notes (Signed)
Advanced Heart Failure Clinic Note   PCP: [facility] PCP-Cardiologist: Minus Breeding, MD  HF Cardiology: Dr. [name]  HPI:  Nicholas Gray is a 59 y.o. male with chronic systolic CHF, NICM (Normal coronaries 2010), HTN, LBBB, Obesity, and OSA on CPAP.  He has been known to have a cardiomyopathy for the last 10 years or so.   Seen in [facility] office 08/13/17 and was overall feeling well. No new symptoms.  Echo 08/27/17 LVEF 25-30%, Grade 1 DD, Mild MR, Mild LAE, mild RV dilation, PA peak pressure 34 mm Hg.  Referred by Dr. Percival Spanish to CHF team for further evaluation and treatment of CHF.    Pt admitted 5/30 - 09/13/2017 with Chest/upper abdominal discomfort and pain. CT scan and HIDA consistent with acute cholecystitis. Underwent laproscopic chole on 09/07/17 and found to have gangrenous GB. 09/09/17 repeat HIDA scan showed bile leak. ERCP 09/11/17, but patient had failed stent placement. Considered for IR for drain placement, but decided on conservative management.  He was admitted again with RUQ pain in 7/19, had ERCP with bile leak and choledocholithiasis noted.  He had stone removal and stent placed.   LHC/RHC in 7/19 showed no significant CAD, optimized filling pressures and preserved cardiac output. Cardiac MRI in 11/19 showed improvement in LV EF to 51% with mildly decreased RV systolic function, no LGE.   Echo in 5/21 showed EF in the range of 40% though it is a technically difficult study.  There is septal-lateral dyssynchrony. Cardiac MRI in 6/21 showed LV EF 46% with septal-lateral dyssynchrony, RV EF 51%, no LGE.   He returns for followup of CHF.  Weight is down 7 lbs.  Dyspnea walking up stairs.  No problems on flat ground. No orthopnea/PND.  Uses CPAP regularly.    ECG (personally reviewed): NSR, LBBB 164 msec  Labs (6/19): K 4.2, creatinine 1.14 Labs (8/19): K 3.9, creatinine 1.14, normal LFTs Labs (01/05/2018): K 4.5 creatinine 1.23  Labs (11/19): K 3.9, creatinine  1.16 Labs (1/20): K 3.9, creatinine 1.13 Labs (3/21): K 4.2, creatinine 1.28, hgb 13.7, LDL 57, HDL 28 Labs (5/21): K 4.2, creatinine 1.33  Social History: Lives in [place], [place]. 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 Denies heavy ETOH use.  Family history: He denies immediate family history of CHF, but thinks his maternal grandparents may have had "heart problems"   Review of systems complete and found to be negative unless listed in HPI.    Past Medical History: 1. Chronic systolic CHF: Suspected nonischemic cardiomyopathy (normal coronaries 2010).  - Echo 2012 with EF 30% - Echo 12/15 with EF 40-45% - Echo 2017 with EF 35-40% - Echo 08/27/17 LVEF 25-30%, Grade 1 DD, Mild MR, Mild LAE, mild RV dilation, PA peak pressure 34 mm Hg.  - LHC/RHC (7/19): No significant coronary disease. RA 7, PA 39/12 mean 25, PCWP mean 11, CI 3.68, PVR 1.65 WU.  - Cardiac MRI (11/19): LV EF 51% with septal-lateral dyssynchrony, mild RV dilation with RV EF 39%, no LGE.  - Echo (5/21): Technically difficult, EF around 40% with diffuse hypokinesis and septal-lateral dyssynchrony, normal RV.  - Cardiac MRI (6/21): LV EF 46% with septal-lateral dyssynchrony, RV EF 51%, no LGE.  2. HTN 3. LBBB, chronic - QRS 166 on EKG 09/04/17 4. Obesity Body mass index is 40.78 kg/m. 5. OSA: Reports compliance with his CPAP.  6. GERD 7. Cholecystitis: Underwent laproscopic chole on 09/07/17 and found to  have gangrenous GB.  - 09/09/17 repeat HIDA scan showed bile leak.  - ERCP 09/11/17, but patient had failed stent placement. - ERCP 7/19 with sphincterotomy, stone removal, stent placement.   Current Outpatient Medications  Medication Sig Dispense Refill  . allopurinol (ZYLOPRIM) 100 MG tablet Take 100 mg by mouth 2 (two) times daily.     . carvedilol (COREG) 25 MG tablet TAKE (1) TABLET BY MOUTH TWICE DAILY WITH A MEAL. 60 tablet 0  . dapagliflozin propanediol (FARXIGA) 10 MG TABS tablet  Take 10 mg by mouth daily before breakfast. 30 tablet 5  . Famotidine (PEPCID PO) Take by mouth daily.    . furosemide (LASIX) 20 MG tablet Take 1 tablet (20 mg total) by mouth as needed for fluid or edema. 30 tablet 3  . sacubitril-valsartan (ENTRESTO) 97-103 MG Take 1 tablet by mouth 2 (two) times daily. 180 tablet 3  . simvastatin (ZOCOR) 40 MG tablet Take 40 mg by mouth at bedtime.    Marland Kitchen spironolactone (ALDACTONE) 25 MG tablet TAKE 1 TABLET BY MOUTH DAILY. 30 tablet 0   No current facility-administered medications for this encounter.   No Known Allergies  Vitals:   11/15/19 0856  BP: 112/74  Pulse: (!) 58  SpO2: 97%  Weight: 128.9 kg (284 lb 3.2 oz)   Wt Readings from Last 3 Encounters:  11/15/19 128.9 kg (284 lb 3.2 oz)  08/10/19 132 kg (291 lb)  06/07/19 132.4 kg (291 lb 12.8 oz)    PHYSICAL EXAM: General: NAD, obese.  Neck: No JVD, no thyromegaly or thyroid nodule.  Lungs: Clear to auscultation bilaterally with normal respiratory effort. CV: Nondisplaced PMI.  Heart regular S1/S2, no S3/S4, no murmur.  No peripheral edema.  No carotid bruit.  Normal pedal pulses.  Abdomen: Soft, nontender, no hepatosplenomegaly, no distention.  Skin: Intact without lesions or rashes.  Neurologic: Alert and oriented x 3.  Psych: Normal affect. Extremities: No clubbing or cyanosis.  HEENT: Normal.   ASSESSMENT & PLAN:  1. Chronic systolic CHF: Nonischemic cardiomyopathy by coronary angiography in 2010 and 7/19.  He has had a cardiomyopathy for at least the last 10 years.  Echo in 5/19 showed a fall in EF to the 25-30% range.  He does not have an ICD as prior echoes had shown EF in the 40% range.  It is possible that he has a LBBB cardiomyopathy.  Prior viral myocarditis is also a possibility. No significant FH of CHF and no heavy ETOH/substance abuse.  Cardiac MRI in 6/21 showed LV EF 46% with septal-lateral dyssynchrony, RV EF 51%, no LGE.  He is not volume overloaded.  NYHA class II  symptoms.   - He does not appear to need standing Lasix.  - Continue current Entresto, spironolactone, and Coreg (all at goal).  - Continue dapagliflozin 10 mg daily.  BMET today and in 3 months.  - EF is out of CRT-D range.    2. HTN: BP controlled.  3. LBBB: Chronic.  4. Obesity: Body mass index is 40.78 kg/m.  - Encouraged weight loss by diet/exercise.  5. OSA: Continue CPAP.   Followup in 6 months.   [name] 11/15/2019

## 2019-12-13 ENCOUNTER — Emergency Department (HOSPITAL_COMMUNITY)
Admission: EM | Admit: 2019-12-13 | Discharge: 2019-12-13 | Disposition: A | Discharge status: Home / Self Care | Payer: 59 | Attending: Emergency Medicine | Admitting: Emergency Medicine

## 2019-12-13 ENCOUNTER — Encounter (HOSPITAL_COMMUNITY): Payer: Self-pay | Admitting: *Deleted

## 2019-12-13 ENCOUNTER — Other Ambulatory Visit: Payer: Self-pay

## 2019-12-13 ENCOUNTER — Emergency Department (HOSPITAL_COMMUNITY): Payer: 59

## 2019-12-13 DIAGNOSIS — Y939 Activity, unspecified: Secondary | ICD-10-CM | POA: Insufficient documentation

## 2019-12-13 DIAGNOSIS — M5416 Radiculopathy, lumbar region: Secondary | ICD-10-CM | POA: Diagnosis not present

## 2019-12-13 DIAGNOSIS — Z87891 Personal history of nicotine dependence: Secondary | ICD-10-CM | POA: Insufficient documentation

## 2019-12-13 DIAGNOSIS — Y999 Unspecified external cause status: Secondary | ICD-10-CM | POA: Insufficient documentation

## 2019-12-13 DIAGNOSIS — Z79899 Other long term (current) drug therapy: Secondary | ICD-10-CM | POA: Insufficient documentation

## 2019-12-13 DIAGNOSIS — X58XXXA Exposure to other specified factors, initial encounter: Secondary | ICD-10-CM | POA: Diagnosis not present

## 2019-12-13 DIAGNOSIS — S30860A Insect bite (nonvenomous) of lower back and pelvis, initial encounter: Secondary | ICD-10-CM | POA: Diagnosis not present

## 2019-12-13 DIAGNOSIS — W57XXXA Bitten or stung by nonvenomous insect and other nonvenomous arthropods, initial encounter: Secondary | ICD-10-CM

## 2019-12-13 DIAGNOSIS — Y929 Unspecified place or not applicable: Secondary | ICD-10-CM | POA: Diagnosis not present

## 2019-12-13 DIAGNOSIS — I10 Essential (primary) hypertension: Secondary | ICD-10-CM | POA: Diagnosis not present

## 2019-12-13 DIAGNOSIS — M549 Dorsalgia, unspecified: Secondary | ICD-10-CM | POA: Diagnosis present

## 2019-12-13 MED ORDER — DOXYCYCLINE HYCLATE 100 MG PO CAPS: 100.0000 mg | ORAL_CAPSULE | Freq: Two times a day (BID) | ORAL | 0 refills | Status: DC

## 2019-12-13 MED ORDER — CYCLOBENZAPRINE HCL 10 MG PO TABS
10.0000 mg | ORAL_TABLET | Freq: Once | ORAL | Status: AC
  Administered 2019-12-13: 10 mg via ORAL
  Filled 2019-12-13: qty 1

## 2019-12-13 MED ORDER — DICLOFENAC SODIUM 1 % EX GEL: 2.0000 g | Freq: Four times a day (QID) | CUTANEOUS | 0 refills | Status: DC

## 2019-12-13 MED ORDER — KETOROLAC TROMETHAMINE 10 MG PO TABS: 10.0000 mg | ORAL_TABLET | Freq: Four times a day (QID) | ORAL | 0 refills | Status: DC | PRN

## 2019-12-13 MED ORDER — KETOROLAC TROMETHAMINE 60 MG/2ML IM SOLN
60.0000 mg | Freq: Once | INTRAMUSCULAR | Status: AC
  Administered 2019-12-13: 60 mg via INTRAMUSCULAR
  Filled 2019-12-13: qty 2

## 2019-12-13 MED ORDER — CYCLOBENZAPRINE HCL 10 MG PO TABS: 10.0000 mg | ORAL_TABLET | Freq: Two times a day (BID) | ORAL | 0 refills | Status: DC | PRN

## 2019-12-13 MED ORDER — DOXYCYCLINE HYCLATE 100 MG PO TABS
100.0000 mg | ORAL_TABLET | Freq: Once | ORAL | Status: AC
  Administered 2019-12-13: 100 mg via ORAL
  Filled 2019-12-13: qty 1

## 2019-12-13 NOTE — ED Triage Notes (Signed)
History of tick bite 6 days ago

## 2019-12-13 NOTE — ED Triage Notes (Signed)
Back pain for the past 2 days

## 2019-12-13 NOTE — Discharge Instructions (Signed)
Your history, physical exam, and imaging is consistent with acute on chronic low back pain with radicular pain.  Please read the attachment on radicular pain.  I would like for you to follow-up with your primary care provider regarding today's encounter.  I have placed an ambulatory referral for physical therapy.  I have also prescribed you continued Toradol medications.  Do not combine with other NSAIDs.  You were given a prescription for Flexeril which is a muscle relaxer.  You should not drive, work, consume alcohol, or operate machinery while taking this medication as it can make you very drowsy.  Additionally, I have prescribed a topical Voltaren gel which she can apply to the affected area.  In addition to the low back pain, you had a tick exposure.  Please take the doxycycline, as directed.  Please return to the ED or seek immediate medical attention should you experience any new or worsening symptoms.

## 2019-12-13 NOTE — ED Provider Notes (Addendum)
[facility] EMERGENCY DEPARTMENT Provider Note   CSN: [id number] Arrival date & time: 12/13/19  1616     History Chief Complaint  Patient presents with  . Back Pain    Nicholas Gray is a 59 y.o. male with PMH of HFrEF, arthritis, gout, HTN, HLD, and OSA presents the ED with a 3-day history of right lower back pain with shooting pain down his right leg.  Patient reports that he sustained a low back injury in the early 1990s and ever since then he has been having intermittent episodes of low back pain with radicular symptoms.  He states that this feels worse than usual and he was even having difficulty climbing on the bed due to the pain symptoms.  Patient states that his pain symptoms are worse with any form of movement.  He denies any precipitating injury or trauma, increased physical activity, history of IVDA, fevers or chills, history of malignancy, numbness or weakness, incontinence or retention, hematuria, abdominal pain, nausea or vomiting, or other symptoms.  Patient also notes that he has a history of RMSF and that he noticed he had a tick on him the other day.  He removed it and it was not particularly swollen, however was unclear about how long it was on.  He suspects that it was on his lower leg somewhere in the neighborhood of 12 hours.  He notes some mild redness around the bite site.    HPI     Past Medical History:  Diagnosis Date  . Arthritis    gout  . Cardiomyopathy    EF 30-40% (Normal coronaries cath 2010).  Echo 2015 EF 40%  . CHF (congestive heart failure) ([place])   . Colonic polyp   . Diverticulosis of colon   . GERD (gastroesophageal reflux disease)   . History of chicken pox   . HLD (hyperlipidemia)    recent  . HTN (hypertension)    recent  . Sleep apnea    does use CPAP.      Patient Active Problem List   Diagnosis Date Noted  . Choledocholithiasis 11/06/2017  . Bile leak   . RUQ pain   . Chronic systolic CHF (congestive heart failure) ([place])  09/03/2017  . ACTINIC KERATOSIS 11/01/2009  . Dilated cardiomyopathy ([place]) 06/12/2009  . OSA (obstructive sleep apnea) 05/02/2009  . Morbid obesity ([place]) 01/31/2009  . LBBB (left bundle branch block) 12/25/2008  . HYPERLIPIDEMIA 11/22/2008  . COLONIC POLYPS 10/19/2008  . Essential hypertension 10/19/2008  . GERD 10/19/2008  . DIVERTICULITIS, COLON 10/19/2008    Past Surgical History:  Procedure Laterality Date  . BILIARY STENT PLACEMENT  11/08/2017   Procedure: BILIARY STENT PLACEMENT;  Surgeon: Carol Ada, MD;  Location: [facility] ENDOSCOPY;  Service: Endoscopy;;  . BIOPSY  03/08/2018   Procedure: BIOPSY;  Surgeon: Doran Stabler, MD;  Location: [facility] ENDOSCOPY;  Service: Gastroenterology;;  . CARDIAC CATHETERIZATION  2010  . CHOLECYSTECTOMY N/A 09/07/2017   Procedure: LAPAROSCOPIC CHOLECYSTECTOMY;  Surgeon: Kinsinger, Arta Bruce, MD;  Location: [place];  Service: General;  Laterality: N/A;  LAPAROSCOPIC CHOLECYSTECTOMY  . COLONOSCOPY WITH PROPOFOL N/A 03/08/2018   Procedure: COLONOSCOPY WITH PROPOFOL;  Surgeon: Doran Stabler, MD;  Location: [facility] ENDOSCOPY;  Service: Gastroenterology;  Laterality: N/A;  . ERCP N/A 09/11/2017   Procedure: ENDOSCOPIC RETROGRADE CHOLANGIOPANCREATOGRAPHY (ERCP);  Surgeon: Doran Stabler, MD;  Location: [place];  Service: Gastroenterology;  Laterality: N/A;  . ERCP N/A 11/08/2017   Procedure: ENDOSCOPIC RETROGRADE CHOLANGIOPANCREATOGRAPHY (  ERCP);  Surgeon: Carol Ada, MD;  Location: [place];  Service: Endoscopy;  Laterality: N/A;  . ERCP N/A 01/11/2018   Procedure: ENDOSCOPIC RETROGRADE CHOLANGIOPANCREATOGRAPHY;  Surgeon: Doran Stabler, MD;  [facility]: [facility] ENDOSCOPY;  Service: Gastroenterology;  Laterality: N/A;  With Balloon Sweep of Ducts  . REMOVAL OF STONES  11/08/2017   Procedure: REMOVAL OF STONES;  Surgeon: Carol Ada, MD;  Location: [place];  Service: Endoscopy;;  . RIGHT/LEFT HEART CATH AND CORONARY ANGIOGRAPHY N/A 10/12/2017    Procedure: RIGHT/LEFT HEART CATH AND CORONARY ANGIOGRAPHY;  Surgeon: Larey Dresser, MD;  Location: [place] CV LAB;  Service: Cardiovascular;  Laterality: N/A;  . SPHINCTEROTOMY  11/08/2017   Procedure: SPHINCTEROTOMY;  Surgeon: Carol Ada, MD;  Location: [place];  Service: Endoscopy;;  . STENT REMOVAL  01/11/2018   Procedure: STENT REMOVAL;  Surgeon: Doran Stabler, MD;  Location: [facility] ENDOSCOPY;  Service: Gastroenterology;;  . TONSILLECTOMY AND ADENOIDECTOMY         Family History  Problem Relation Age of Onset  . Arthritis Other        family history  . Diabetes Other        family history  . Hyperlipidemia Other        family history  . Lung cancer Other        family history  . Stroke Other        family history    Social History   Tobacco Use  . Smoking status: Former Smoker    Packs/day: 1.50    Years: 3.00    Pack years: 4.50    Quit date: 04/07/1980    Years since quitting: 39.7  . Smokeless tobacco: Never Used  Vaping Use  . Vaping Use: Never used  Substance Use Topics  . Alcohol use: No  . Drug use: No    Comment: quit in 1999, marijuana    Home Medications Prior to Admission medications   Medication Sig Start Date End Date Taking? Authorizing Provider  allopurinol (ZYLOPRIM) 100 MG tablet Take 100 mg by mouth 2 (two) times daily.     [provider]  carvedilol (COREG) 25 MG tablet TAKE (1) TABLET BY MOUTH TWICE DAILY WITH A MEAL. 10/19/19   Larey Dresser, MD  cyclobenzaprine (FLEXERIL) 10 MG tablet Take 1 tablet (10 mg total) by mouth 2 (two) times daily as needed for muscle spasms. 12/13/19   Corena Herter, PA-C  dapagliflozin propanediol (FARXIGA) 10 MG TABS tablet Take 10 mg by mouth daily before breakfast. 08/10/19   Larey Dresser, MD  diclofenac Sodium (VOLTAREN) 1 % GEL Apply 2 g topically 4 (four) times daily. 12/13/19   Corena Herter, PA-C  doxycycline (VIBRAMYCIN) 100 MG capsule Take 1 capsule (100 mg total) by mouth 2  (two) times daily. 12/13/19   Corena Herter, PA-C  Famotidine (PEPCID PO) Take by mouth daily.    [provider]  furosemide (LASIX) 20 MG tablet Take 1 tablet (20 mg total) by mouth as needed for fluid or edema. 11/30/17   Larey Dresser, MD  ketorolac (TORADOL) 10 MG tablet Take 1 tablet (10 mg total) by mouth every 6 (six) hours as needed for moderate pain. 12/13/19   Corena Herter, PA-C  sacubitril-valsartan (ENTRESTO) 97-103 MG Take 1 tablet by mouth 2 (two) times daily. 04/14/19   Larey Dresser, MD  simvastatin (ZOCOR) 40 MG tablet Take 40 mg by mouth at bedtime.  [provider]  spironolactone (ALDACTONE) 25 MG tablet TAKE 1 TABLET BY MOUTH DAILY. 10/19/19   Larey Dresser, MD    Allergies    Patient has no known allergies.  Review of Systems   Review of Systems  All other systems reviewed and are negative.   Physical Exam Updated Vital Signs BP 120/69 (BP Location: Right Arm)   Pulse 60   Temp 97.6 F (36.4 C) (Oral)   Resp 20   Ht 5\' 10"  (1.778 m)   Wt 127 kg   SpO2 100%   BMI 40.18 kg/m   Physical Exam Vitals and nursing note reviewed. Exam conducted with a chaperone present.  Constitutional:      Appearance: Normal appearance. He is not toxic-appearing.  HENT:     Head: Normocephalic and atraumatic.  Eyes:     General: No scleral icterus.    Conjunctiva/sclera: Conjunctivae normal.  Neck:     Comments: No midline cervical TTP. Cardiovascular:     Rate and Rhythm: Normal rate and regular rhythm.     Pulses: Normal pulses.     Heart sounds: Normal heart sounds.  Pulmonary:     Effort: Pulmonary effort is normal. No respiratory distress.     Breath sounds: Normal breath sounds. No wheezing or rales.  Abdominal:     General: Abdomen is flat. There is no distension.     Palpations: Abdomen is soft.     Tenderness: There is no abdominal tenderness. There is no guarding.  Musculoskeletal:     Cervical back: Normal range of motion and  neck supple. No rigidity.     Comments: Midline lumbar TTP over L5-S1 area.  No overlying skin changes.  Mild right-sided paraspinous TTP, as well.  No left-sided lumbar region TTP.  Positive right-sided SLR with radiation down to right popliteal region.  Negative left-sided SLR. Peripheral pulses intact and symmetric.  Patient can lift right leg and can dorsiflex and plantarflex right foot against resistance.  Strength largely intact, limited solely due to pain symptoms.  Can ambulate with antalgia.  Skin:    General: Skin is dry.  Neurological:     Mental Status: He is alert.     GCS: GCS eye subscore is 4. GCS verbal subscore is 5. GCS motor subscore is 6.  Psychiatric:        Mood and Affect: Mood normal.        Behavior: Behavior normal.        Thought Content: Thought content normal.     ED Results / Procedures / Treatments   Labs (all labs ordered are listed, but only abnormal results are displayed) Labs Reviewed - No data to display  EKG None  Radiology DG Lumbar Spine Complete  Result Date: 12/13/2019 CLINICAL DATA:  Acute on chronic low back and right leg pain. EXAM: LUMBAR SPINE - COMPLETE 4+ VIEW COMPARISON:  CT abdomen pelvis dated November 06, 2017. FINDINGS: Five lumbar type vertebral bodies. No acute fracture or subluxation. Vertebral body heights are preserved. Alignment is normal. Unchanged mild diffuse disc height loss throughout the lumbar spine, sparing L2-L3. The sacroiliac joints are unremarkable. IMPRESSION: 1. Unchanged mild multilevel lumbar spondylosis. Electronically Signed   By: Titus Dubin M.D.   On: 12/13/2019 21:10    Procedures Procedures (including critical care time)  Medications Ordered in ED Medications  ketorolac (TORADOL) injection 60 mg (60 mg Intramuscular Given 12/13/19 2049)  cyclobenzaprine (FLEXERIL) tablet 10 mg (10 mg Oral Given 12/13/19 2049)  doxycycline (VIBRA-TABS) tablet 100 mg (100 mg Oral Given 12/13/19 2049)    ED Course  I have  reviewed the triage vital signs and the nursing notes.  Pertinent labs & imaging results that were available during my care of the patient were reviewed by me and considered in my medical decision making (see chart for details).    MDM Rules/Calculators/A&P                          Patient presents to the ED with acute on chronic low back pain with right-sided radicular symptoms.  Patient states that these symptoms stem from injury to his low back in the early 1990s.  He is contemplating pursuing disability given his persistent issues.  He remarks that he recently lost his neighbor and was going for a long walk to the site of the accident when he suspects that he was bitten by a tick.  He endorses a history of RMSF and states that he is unclear as to how long the tick was on his left lower leg.  There is some mild surrounding erythema.  No induration.  No spreading rash or rash elsewhere.  Wrists and forearms are spared.  Patient's low back pain is reproducible with movement.  Suspect muscle skeletal etiology.  Given midline spinal TTP, obtain plain films of lumbar region.  I personally reviewed plain films which demonstrates unchanged mild multilevel lumbar spondylosis.  Patient was treated with IM Toradol and Flexeril here in the ED.  He assures me that he was driven here by his wife.  I also provide him with first dose of doxycycline to cover him for potential tickborne illness.  On subsequent evaluation, patient is feeling improved after Toradol Flexeril administration here in the ED.  No red flags concerning patient's back pain. No s/s of central cord compression or cauda equina. Lower extremities are neurovascularly intact and patient is ambulating without difficulty.  We discussed strict ED return precautions.  Patient voices understanding and is agreeable to the plan.   Final Clinical Impression(s) / ED Diagnoses Final diagnoses:  Acute radicular low back pain  Tick bite, initial encounter      Rx / DC Orders ED Discharge Orders         Ordered    doxycycline (VIBRAMYCIN) 100 MG capsule  2 times daily        12/13/19 2036    cyclobenzaprine (FLEXERIL) 10 MG tablet  2 times daily PRN        12/13/19 2036    ketorolac (TORADOL) 10 MG tablet  Every 6 hours PRN        12/13/19 2036    Ambulatory referral to Physical Therapy        12/13/19 2036    diclofenac Sodium (VOLTAREN) 1 % GEL  4 times daily        12/13/19 2203           Corena Herter, PA-C 12/13/19 2208    Corena Herter, PA-C 12/13/19 2229    Milton Ferguson, MD 12/16/19 0830

## 2019-12-19 ENCOUNTER — Other Ambulatory Visit: Payer: Self-pay | Admitting: Internal Medicine

## 2019-12-19 ENCOUNTER — Other Ambulatory Visit (HOSPITAL_COMMUNITY): Payer: Self-pay | Admitting: Cardiology

## 2019-12-28 ENCOUNTER — Other Ambulatory Visit: Payer: Self-pay

## 2019-12-28 ENCOUNTER — Encounter (HOSPITAL_COMMUNITY): Payer: Self-pay | Admitting: Physical Therapy

## 2019-12-28 ENCOUNTER — Ambulatory Visit (HOSPITAL_COMMUNITY): Payer: 59 | Attending: Physician Assistant | Admitting: Physical Therapy

## 2019-12-28 DIAGNOSIS — M5441 Lumbago with sciatica, right side: Secondary | ICD-10-CM | POA: Insufficient documentation

## 2019-12-28 DIAGNOSIS — R262 Difficulty in walking, not elsewhere classified: Secondary | ICD-10-CM | POA: Insufficient documentation

## 2019-12-28 DIAGNOSIS — G8929 Other chronic pain: Secondary | ICD-10-CM | POA: Insufficient documentation

## 2019-12-28 NOTE — Therapy (Addendum)
[place], [place], [address] Phone: [phone]   Fax:  [phone]  Physical Therapy Evaluation  Patient Details  Name: Nicholas Gray MRN: [id number] Date of Birth: 25-May-1960 Referring Provider (PT): Corena Herter PA   Encounter Date: 12/28/2019   PT End of Session - 12/28/19 0916    Visit Number 1    Number of Visits 8    Date for PT Re-Evaluation 02/08/20    Authorization Type [employer], VL 30 PT/OT/ chiro    Progress Note Due on Visit 10    PT Start Time 0916    PT Stop Time 0954    PT Time Calculation (min) 38 min    Activity Tolerance Patient limited by pain           Past Medical History:  Diagnosis Date  . Arthritis    gout  . Cardiomyopathy    EF 30-40% (Normal coronaries cath 2010).  Echo 2015 EF 40%  . CHF (congestive heart failure) ([place])   . Colonic polyp   . Diverticulosis of colon   . GERD (gastroesophageal reflux disease)   . History of chicken pox   . HLD (hyperlipidemia)    recent  . HTN (hypertension)    recent  . Sleep apnea    does use CPAP.      Past Surgical History:  Procedure Laterality Date  . BILIARY STENT PLACEMENT  11/08/2017   Procedure: BILIARY STENT PLACEMENT;  Surgeon: Carol Ada, MD;  Location: [facility] ENDOSCOPY;  Service: Endoscopy;;  . BIOPSY  03/08/2018   Procedure: BIOPSY;  Surgeon: Doran Stabler, MD;  Location: [facility] ENDOSCOPY;  Service: Gastroenterology;;  . CARDIAC CATHETERIZATION  2010  . CHOLECYSTECTOMY N/A 09/07/2017   Procedure: LAPAROSCOPIC CHOLECYSTECTOMY;  Surgeon: Kinsinger, Arta Bruce, MD;  Location: [place];  Service: General;  Laterality: N/A;  LAPAROSCOPIC CHOLECYSTECTOMY  . COLONOSCOPY WITH PROPOFOL N/A 03/08/2018   Procedure: COLONOSCOPY WITH PROPOFOL;  Surgeon: Doran Stabler, MD;  [facility]: [facility] ENDOSCOPY;  Service: Gastroenterology;  Laterality: N/A;  . ERCP N/A 09/11/2017   Procedure: ENDOSCOPIC RETROGRADE CHOLANGIOPANCREATOGRAPHY  (ERCP);  Surgeon: Doran Stabler, MD;  Location: [place];  Service: Gastroenterology;  Laterality: N/A;  . ERCP N/A 11/08/2017   Procedure: ENDOSCOPIC RETROGRADE CHOLANGIOPANCREATOGRAPHY (ERCP);  Surgeon: Carol Ada, MD;  Location: [place];  Service: Endoscopy;  Laterality: N/A;  . ERCP N/A 01/11/2018   Procedure: ENDOSCOPIC RETROGRADE CHOLANGIOPANCREATOGRAPHY;  Surgeon: Doran Stabler, MD;  Location: [facility] ENDOSCOPY;  Service: Gastroenterology;  Laterality: N/A;  With Balloon Sweep of Ducts  . REMOVAL OF STONES  11/08/2017   Procedure: REMOVAL OF STONES;  Surgeon: Carol Ada, MD;  Location: [place];  Service: Endoscopy;;  . RIGHT/LEFT HEART CATH AND CORONARY ANGIOGRAPHY N/A 10/12/2017   Procedure: RIGHT/LEFT HEART CATH AND CORONARY ANGIOGRAPHY;  Surgeon: Larey Dresser, MD;  Location: [place] CV LAB;  Service: Cardiovascular;  Laterality: N/A;  . SPHINCTEROTOMY  11/08/2017   Procedure: SPHINCTEROTOMY;  Surgeon: Carol Ada, MD;  Location: [place];  Service: Endoscopy;;  . STENT REMOVAL  01/11/2018   Procedure: STENT REMOVAL;  Surgeon: Doran Stabler, MD;  Location: [facility] ENDOSCOPY;  Service: Gastroenterology;;  . TONSILLECTOMY AND ADENOIDECTOMY      There were no vitals filed for this visit.    Subjective Assessment - 12/28/19 1042    Subjective States that he has chronic low back pain. States he doesn't know what he  had done but he stepped or shifted his back that weekend prior to the ED and he said he had pain down into his hip. States that stabbing pain got better but that his chronic back pain is still there. States that he has had this pain for years, originally hurt it 40. States in the last few years he has had more episodes and at times it radiates into his hips. States his only time he gets relief is when he lays on his back. States that he doesn't like to take medications and doesn't use heat or ice. States he pushes through his pain to get tasks done.  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 States that if he has to carry something for a long period of time he gets to be in a lot of pain. Carrying and walking he has to be careful. Current pain 2/10 described as achy/dull at center to right side of back. States that he had PT a while ago 1991. Has difficulty washing feet in shower because he doesn't have the mobility to grab his foot.    Patient Stated Goals to be able to roll over in bed with less pain    Currently in Pain? Yes    Pain Location Back    Pain Orientation Right;Lateral;Lower    Pain Descriptors / Indicators Aching;Dull    Pain Type Chronic pain    Pain Onset More than a month ago    Pain Frequency Constant    Aggravating Factors  movement, bed mobilities              [facility] PT Assessment - 12/28/19 0001      Assessment   Medical Diagnosis LBP    Referring Provider (PT) Corena Herter PA    Prior Therapy yes years ago       Observation/Other Assessments   Focus on Therapeutic Outcomes (FOTO)  16.44% function      ROM / Strength   AROM / PROM / Strength AROM      AROM   AROM Assessment Site Lumbar;Hip    Right/Left Hip Left;Right    Right Hip External Rotation  60   pain on right side   Right Hip Internal Rotation  30    pain on right side   Left Hip External Rotation  60    Left Hip Internal Rotation  45    Lumbar Flexion 50% limited   limited reversal with return upright   Lumbar Extension 50% limited    Lumbar - Right Side Bend 75% limited   painful on side   Lumbar - Left Side Bend 25% limited    Lumbar - Right Rotation 25% limited    Lumbar - Left Rotation 25% limited      Palpation   Spinal mobility hypomobility in L3-5 and pain along right lateral side of back     Palpation comment tenderness to palpation along right lumbar paraspinals, QL, glute meds and sacral border      Special Tests   Other special tests taction - relief  (in 90/90 position)       Bed Mobility   Bed Mobility Rolling Right;Rolling Left    Rolling Right --   labored movements   Rolling Left --   labored movements  Objective measurements completed on examination: See above findings.       [facility] Adult PT Treatment/Exercise - 12/28/19 0001      Exercises   Exercises Lumbar      Lumbar Exercises: Seated   Other Seated Lumbar Exercises self traction in chair x10 10" holds                  PT Education - 12/28/19 1042    Education Details in [place], in self traction, benefits of aquatics and plan moving forward    Person(s) Educated Patient    Methods Explanation    Comprehension Verbalized understanding            PT Short Term Goals - 12/28/19 1058      PT SHORT TERM GOAL #1   Title Patient will be independent in self management strategies to improve quality of life and functional outcomes.    Time 3    Period Weeks    Status New    Target Date 01/18/20      PT SHORT TERM GOAL #2   Title Patient will report at least 25% improvement in overall symptoms and/or function to demonstrate improved functional mobility    Time 3    Period Weeks    Status New    Target Date 01/18/20      PT SHORT TERM GOAL #3   Title Patient will report improved ease rolling over in bed    Time 3    Period Weeks    Status New    Target Date 01/18/20             PT Long Term Goals - 12/28/19 1102      PT LONG TERM GOAL #1   Title Patient will improve on FOTO score to meet predicted outcomes to demonstrate improved functional mobility.    Time 6    Period Weeks    Status New    Target Date 02/08/20      PT LONG TERM GOAL #2   Title Patient will report being able to decrease pain intermittently throuhgout the day with self traction techniques    Time 6    Period Weeks    Status New    Target Date 02/08/20      PT LONG TERM GOAL #3   Title Patient will report at least 50% improvement in  overall symptoms and/or function to demonstrate improved functional mobility    Time 6    Period Weeks    Status New    Target Date 02/08/20                  Plan - 12/28/19 1044    Clinical Impression Statement Patient presents with chronic low back pain that presents with episodic flare ups where pain shoots into right hip and is described as stabbing. Poor tolerance to today's session secondary to high levels of pain with transitional movements. Patient benefited most from traction during session and would benefit from skilled physical therapy focusing on traction, ROM, aquatics and improving patient's ability to perform transitional movements.    Personal Factors and Comorbidities Comorbidity 1;Comorbidity 2    Comorbidities chronic low back pain    Examination-Activity Limitations Bed Mobility;Bend;Squat;Stairs;Stand;Locomotion Level;Transfers    Examination-Participation Restrictions Meal Prep;Driving;Community Activity;Cleaning;Yard Work;Shop    Stability/Clinical Decision Making Stable/Uncomplicated    Clinical Decision Making Low    Rehab Potential Fair    PT Frequency --   1-2x/week for total of 8  visists over 6 week certification period   PT Duration 6 weeks    PT Treatment/Interventions ADLs/Self Care Home Management;Aquatic Therapy;Cryotherapy;Electrical Stimulation;Moist Heat;Traction;Balance training;Therapeutic exercise;Therapeutic activities;Functional mobility training;Gait training;Neuromuscular re-education;Iontophoresis 4mg /ml Dexamethasone;Patient/family education;Manual techniques;Dry needling;Passive range of motion;Joint Manipulations    PT Next Visit Plan aquatics, pain relieving interventions (prone provokes symptoms), TRACTION, gentle ROM and isometrics    PT Home Exercise Plan self traction in chair    Consulted and Agree with Plan of Care Patient           Patient will benefit from skilled therapeutic intervention in order to improve the following  deficits and impairments:  Pain, Decreased activity tolerance, Decreased strength, Difficulty walking, Decreased mobility, Decreased range of motion, Postural dysfunction, Improper body mechanics, Hypomobility  Visit Diagnosis: Chronic right-sided low back pain with right-sided sciatica - Plan: PT plan of care cert/re-cert  Difficulty in walking, not elsewhere classified - Plan: PT plan of care cert/re-cert     Problem List Patient Active Problem List   Diagnosis Date Noted  . Choledocholithiasis 11/06/2017  . Bile leak   . RUQ pain   . Chronic systolic CHF (congestive heart failure) ([place]) 09/03/2017  . ACTINIC KERATOSIS 11/01/2009  . Dilated cardiomyopathy ([place]) 06/12/2009  . OSA (obstructive sleep apnea) 05/02/2009  . Morbid obesity ([place]) 01/31/2009  . LBBB (left bundle branch block) 12/25/2008  . HYPERLIPIDEMIA 11/22/2008  . COLONIC POLYPS 10/19/2008  . Essential hypertension 10/19/2008  . GERD 10/19/2008  . DIVERTICULITIS, COLON 10/19/2008   11:12 AM, 12/28/19 Jerene Pitch, DPT Physical Therapy with [facility]  [phone] office   [place] [address] [place], [place], [address] Phone: [phone]   Fax:  [phone]  Name: STEFFAN CANIGLIA MRN: [id number] Date of Birth: [birth year]

## 2019-12-28 NOTE — Addendum Note (Signed)
Addended by: Jerene Pitch R on: 12/28/2019 11:12 AM   Modules accepted: Orders

## 2020-01-04 ENCOUNTER — Encounter (HOSPITAL_COMMUNITY): Payer: Self-pay | Admitting: Physical Therapy

## 2020-01-04 ENCOUNTER — Other Ambulatory Visit: Payer: Self-pay

## 2020-01-04 ENCOUNTER — Ambulatory Visit (HOSPITAL_COMMUNITY): Payer: 59 | Admitting: Physical Therapy

## 2020-01-04 DIAGNOSIS — G8929 Other chronic pain: Secondary | ICD-10-CM

## 2020-01-04 DIAGNOSIS — M5441 Lumbago with sciatica, right side: Secondary | ICD-10-CM | POA: Diagnosis not present

## 2020-01-04 DIAGNOSIS — R262 Difficulty in walking, not elsewhere classified: Secondary | ICD-10-CM

## 2020-01-04 NOTE — Therapy (Signed)
[place], [place], [address] Phone: [phone]   Fax:  [phone]  Physical Therapy Treatment  Patient Details  Name: Nicholas Gray MRN: [id number] Date of Birth: [birth year] Referring Provider (PT): Corena Herter PA   Encounter Date: 01/04/2020   PT End of Session - 01/04/20 0849    Visit Number 2    Number of Visits 8    Date for PT Re-Evaluation 02/08/20    Authorization Type [employer], VL 30 PT/OT/ chiro    Progress Note Due on Visit 10    PT Start Time 0830    PT Stop Time 0910    PT Time Calculation (min) 40 min    Activity Tolerance Patient limited by pain           Past Medical History:  Diagnosis Date  . Arthritis    gout  . Cardiomyopathy    EF 30-40% (Normal coronaries cath 2010).  Echo 2015 EF 40%  . CHF (congestive heart failure) ([place])   . Colonic polyp   . Diverticulosis of colon   . GERD (gastroesophageal reflux disease)   . History of chicken pox   . HLD (hyperlipidemia)    recent  . HTN (hypertension)    recent  . Sleep apnea    does use CPAP.      Past Surgical History:  Procedure Laterality Date  . BILIARY STENT PLACEMENT  11/08/2017   Procedure: BILIARY STENT PLACEMENT;  Surgeon: Carol Ada, MD;  Location: [facility] ENDOSCOPY;  Service: Endoscopy;;  . BIOPSY  03/08/2018   Procedure: BIOPSY;  Surgeon: Doran Stabler, MD;  Location: [facility] ENDOSCOPY;  Service: Gastroenterology;;  . CARDIAC CATHETERIZATION  2010  . CHOLECYSTECTOMY N/A 09/07/2017   Procedure: LAPAROSCOPIC CHOLECYSTECTOMY;  Surgeon: Kinsinger, Arta Bruce, MD;  Location: [place];  Service: General;  Laterality: N/A;  LAPAROSCOPIC CHOLECYSTECTOMY  . COLONOSCOPY WITH PROPOFOL N/A 03/08/2018   Procedure: COLONOSCOPY WITH PROPOFOL;  Surgeon: Doran Stabler, MD;  Location: [facility] ENDOSCOPY;  Service: Gastroenterology;  Laterality: N/A;  . ERCP N/A 09/11/2017   Procedure: ENDOSCOPIC RETROGRADE CHOLANGIOPANCREATOGRAPHY  (ERCP);  Surgeon: Doran Stabler, MD;  Location: [place];  Service: Gastroenterology;  Laterality: N/A;  . ERCP N/A 11/08/2017   Procedure: ENDOSCOPIC RETROGRADE CHOLANGIOPANCREATOGRAPHY (ERCP);  Surgeon: Carol Ada, MD;  Location: [place];  Service: Endoscopy;  Laterality: N/A;  . ERCP N/A 01/11/2018   Procedure: ENDOSCOPIC RETROGRADE CHOLANGIOPANCREATOGRAPHY;  Surgeon: Doran Stabler, MD;  Location: [facility] ENDOSCOPY;  Service: Gastroenterology;  Laterality: N/A;  With Balloon Sweep of Ducts  . REMOVAL OF STONES  11/08/2017   Procedure: REMOVAL OF STONES;  Surgeon: Carol Ada, MD;  Location: [place];  Service: Endoscopy;;  . RIGHT/LEFT HEART CATH AND CORONARY ANGIOGRAPHY N/A 10/12/2017   Procedure: RIGHT/LEFT HEART CATH AND CORONARY ANGIOGRAPHY;  Surgeon: Larey Dresser, MD;  Location: [place] CV LAB;  Service: Cardiovascular;  Laterality: N/A;  . SPHINCTEROTOMY  11/08/2017   Procedure: SPHINCTEROTOMY;  Surgeon: Carol Ada, MD;  Location: [place];  Service: Endoscopy;;  . STENT REMOVAL  01/11/2018   Procedure: STENT REMOVAL;  Surgeon: Doran Stabler, MD;  Location: [facility] ENDOSCOPY;  Service: Gastroenterology;;  . TONSILLECTOMY AND ADENOIDECTOMY      There were no vitals filed for this visit.   Subjective Assessment - 01/04/20 0830    Subjective PT states that he woke up stiff today.  He does not have any radicular  sx at this time.    How long can you sit comfortably? pain increases after an hour    How long can you stand comfortably? 10 minutes    How long can you walk comfortably? 15    Patient Stated Goals to be able to roll over in bed with less pain    Currently in Pain? Yes    Pain Score 3     Pain Location Back    Pain Orientation Lower    Pain Descriptors / Indicators Aching    Pain Type Chronic pain    Pain Onset More than a month ago                             [facility] Adult PT Treatment/Exercise - 01/04/20 0001      Bed  Mobility   Bed Mobility --   how to get in and out of bed with proper body mechanics      Exercises   Exercises Lumbar      Lumbar Exercises: Stretches   Active Hamstring Stretch Right;Left;3 reps;30 seconds    Single Knee to Chest Stretch Right;Left;3 reps;30 seconds    Lower Trunk Rotation 5 reps      Lumbar Exercises: Seated   Other Seated Lumbar Exercises sitting in tall posture x 5       Lumbar Exercises: Supine   Ab Set 15 reps    Glut Set 10 reps      Modalities   Modalities Traction      Traction   Type of Traction Lumbar    Min (lbs) 60    Max (lbs) 60   Pt wt is 280    Hold Time static     Time 15                    PT Short Term Goals - 01/04/20 0903      PT SHORT TERM GOAL #1   Title Patient will be independent in self management strategies to improve quality of life and functional outcomes.    Time 3    Period Weeks    Status On-going    Target Date 01/18/20      PT SHORT TERM GOAL #2   Title Patient will report at least 25% improvement in overall symptoms and/or function to demonstrate improved functional mobility    Time 3    Period Weeks    Status On-going    Target Date 01/18/20      PT SHORT TERM GOAL #3   Title Patient will report improved ease rolling over in bed    Time 3    Period Weeks    Status On-going    Target Date 01/18/20             PT Long Term Goals - 01/04/20 0905      PT LONG TERM GOAL #1   Title Patient will improve on FOTO score to meet predicted outcomes to demonstrate improved functional mobility.    Time 6    Period Weeks    Status On-going      PT LONG TERM GOAL #2   Title Patient will report being able to decrease pain intermittently throuhgout the day with self traction techniques    Time 6    Period Weeks    Status On-going      PT LONG TERM GOAL #3   Title Patient will report  at least 50% improvement in overall symptoms and/or function to demonstrate improved functional mobility    Time 6      Period Weeks    Status On-going                 Plan - 01/04/20 0850    Clinical Impression Statement Therapist began session with education on bed mobility, gentle stretches and strengthening which were given as HEP.  Trial of mechanical traction at static setting 60" with good results.    Personal Factors and Comorbidities Comorbidity 1;Comorbidity 2    Comorbidities chronic low back pain    Examination-Activity Limitations Bed Mobility;Bend;Squat;Stairs;Stand;Locomotion Level;Transfers    Examination-Participation Restrictions Meal Prep;Driving;Community Activity;Cleaning;Yard Work;Shop    Stability/Clinical Decision Making Stable/Uncomplicated    Rehab Potential Fair    PT Frequency --   1-2x/week for total of 8 visists over 6 week certification period   PT Duration 6 weeks    PT Treatment/Interventions ADLs/Self Care Home Management;Aquatic Therapy;Cryotherapy;Electrical Stimulation;Moist Heat;Traction;Balance training;Therapeutic exercise;Therapeutic activities;Functional mobility training;Gait training;Neuromuscular re-education;Iontophoresis 4mg /ml Dexamethasone;Patient/family education;Manual techniques;Dry needling;Passive range of motion;Joint Manipulations    PT Next Visit Plan aquatics, pain relieving interventions (prone provokes symptoms),,check tolerance to tx progress  ROM and isometrics    PT Home Exercise Plan self traction in chair; 9/29:  ab and glut set, hamstring, knee to chest stretch, LTR and sitting posture.    Consulted and Agree with Plan of Care Patient           Patient will benefit from skilled therapeutic intervention in order to improve the following deficits and impairments:  Pain, Decreased activity tolerance, Decreased strength, Difficulty walking, Decreased mobility, Decreased range of motion, Postural dysfunction, Improper body mechanics, Hypomobility  Visit Diagnosis: Difficulty in walking, not elsewhere classified  Chronic right-sided low  back pain with right-sided sciatica     Problem List Patient Active Problem List   Diagnosis Date Noted  . Choledocholithiasis 11/06/2017  . Bile leak   . RUQ pain   . Chronic systolic CHF (congestive heart failure) ([place]) 09/03/2017  . ACTINIC KERATOSIS 11/01/2009  . Dilated cardiomyopathy ([place]) 06/12/2009  . OSA (obstructive sleep apnea) 05/02/2009  . Morbid obesity ([place]) 01/31/2009  . LBBB (left bundle branch block) 12/25/2008  . HYPERLIPIDEMIA 11/22/2008  . COLONIC POLYPS 10/19/2008  . Essential hypertension 10/19/2008  . GERD 10/19/2008  . DIVERTICULITIS, COLON 10/19/2008    Rayetta Humphrey, PT CLT [phone] 01/04/2020, 9:15 AM  [place], [place], [address] Phone: [phone]   Fax:  [phone]  Name: Nicholas Gray MRN: [id number] Date of Birth: 02/08/1961

## 2020-01-05 ENCOUNTER — Encounter (HOSPITAL_COMMUNITY): Payer: Self-pay | Admitting: Physical Therapy

## 2020-01-05 ENCOUNTER — Ambulatory Visit (HOSPITAL_COMMUNITY): Payer: 59 | Admitting: Physical Therapy

## 2020-01-05 DIAGNOSIS — R262 Difficulty in walking, not elsewhere classified: Secondary | ICD-10-CM

## 2020-01-05 DIAGNOSIS — M5441 Lumbago with sciatica, right side: Secondary | ICD-10-CM | POA: Diagnosis not present

## 2020-01-05 DIAGNOSIS — G8929 Other chronic pain: Secondary | ICD-10-CM

## 2020-01-05 NOTE — Therapy (Signed)
[place], [place], [address] Phone: [phone]   Fax:  [phone]  Physical Therapy Treatment  Patient Details  Name: Nicholas Gray MRN: [id number] Date of Birth: 17-Sep-1960 Referring Provider (PT): Corena Herter PA   Encounter Date: 01/05/2020   PT End of Session - 01/05/20 1733    Visit Number 3    Number of Visits 8    Date for PT Re-Evaluation 02/08/20    Authorization Type [employer], VL 30 PT/OT/ chiro    Progress Note Due on Visit 10    PT Start Time 1400    PT Stop Time 1445    PT Time Calculation (min) 45 min    Activity Tolerance Patient tolerated treatment well           Past Medical History:  Diagnosis Date  . Arthritis    gout  . Cardiomyopathy    EF 30-40% (Normal coronaries cath 2010).  Echo 2015 EF 40%  . CHF (congestive heart failure) ([place])   . Colonic polyp   . Diverticulosis of colon   . GERD (gastroesophageal reflux disease)   . History of chicken pox   . HLD (hyperlipidemia)    recent  . HTN (hypertension)    recent  . Sleep apnea    does use CPAP.      Past Surgical History:  Procedure Laterality Date  . BILIARY STENT PLACEMENT  11/08/2017   Procedure: BILIARY STENT PLACEMENT;  Surgeon: Carol Ada, MD;  Location: [facility] ENDOSCOPY;  Service: Endoscopy;;  . BIOPSY  03/08/2018   Procedure: BIOPSY;  Surgeon: Doran Stabler, MD;  Location: [facility] ENDOSCOPY;  Service: Gastroenterology;;  . CARDIAC CATHETERIZATION  2010  . CHOLECYSTECTOMY N/A 09/07/2017   Procedure: LAPAROSCOPIC CHOLECYSTECTOMY;  Surgeon: Kinsinger, Arta Bruce, MD;  Location: [place];  Service: General;  Laterality: N/A;  LAPAROSCOPIC CHOLECYSTECTOMY  . COLONOSCOPY WITH PROPOFOL N/A 03/08/2018   Procedure: COLONOSCOPY WITH PROPOFOL;  Surgeon: Doran Stabler, MD;  [facility] ENDOSCOPY;  Service: Gastroenterology;  Laterality: N/A;  . ERCP N/A 09/11/2017   Procedure: ENDOSCOPIC RETROGRADE  CHOLANGIOPANCREATOGRAPHY (ERCP);  Surgeon: Doran Stabler, MD;  Location: [place];  Service: Gastroenterology;  Laterality: N/A;  . ERCP N/A 11/08/2017   Procedure: ENDOSCOPIC RETROGRADE CHOLANGIOPANCREATOGRAPHY (ERCP);  Surgeon: Carol Ada, MD;  Location: [place];  Service: Endoscopy;  Laterality: N/A;  . ERCP N/A 01/11/2018   Procedure: ENDOSCOPIC RETROGRADE CHOLANGIOPANCREATOGRAPHY;  Surgeon: Doran Stabler, MD;  Location: [facility] ENDOSCOPY;  Service: Gastroenterology;  Laterality: N/A;  With Balloon Sweep of Ducts  . REMOVAL OF STONES  11/08/2017   Procedure: REMOVAL OF STONES;  Surgeon: Carol Ada, MD;  Location: [place];  Service: Endoscopy;;  . RIGHT/LEFT HEART CATH AND CORONARY ANGIOGRAPHY N/A 10/12/2017   Procedure: RIGHT/LEFT HEART CATH AND CORONARY ANGIOGRAPHY;  Surgeon: Larey Dresser, MD;  Location: [place] CV LAB;  Service: Cardiovascular;  Laterality: N/A;  . SPHINCTEROTOMY  11/08/2017   Procedure: SPHINCTEROTOMY;  Surgeon: Carol Ada, MD;  Location: [place];  Service: Endoscopy;;  . STENT REMOVAL  01/11/2018   Procedure: STENT REMOVAL;  Surgeon: Doran Stabler, MD;  Location: [facility] ENDOSCOPY;  Service: Gastroenterology;;  . TONSILLECTOMY AND ADENOIDECTOMY      There were no vitals filed for this visit.   Subjective Assessment - 01/05/20 1731    Subjective Patient says he feels his back is doing better overall. Says he was able to  unload some deer feed right before appointment today with no increased pain. No back pain currently.    How long can you sit comfortably? pain increases after an hour    How long can you stand comfortably? 10 minutes    How long can you walk comfortably? 15    Patient Stated Goals to be able to roll over in bed with less pain    Currently in Pain? No/denies    Pain Onset More than a month ago                         Adult Aquatic Therapy - 01/05/20 1742      Treatment   Gait dynamic warm up: pool  walking, sidestepping, retro walking 3RT each     Exercises TA brace at pool wall 15 x 5" hold, standing hip abduction 2 x 15 each, single arm dumbell pull down 2 x 10 each, double arm dumbbell pull down x 10, lumbar traction hanging at pool wall 2 x60" holds, dyanamic cool down pool walking 2RT                       PT Education - 01/05/20 1732    Education Details on benefits and application of aquatic therapy, on exercise and technique    Person(s) Educated Patient    Methods Explanation    Comprehension Verbalized understanding            PT Short Term Goals - 01/04/20 0903      PT SHORT TERM GOAL #1   Title Patient will be independent in self management strategies to improve quality of life and functional outcomes.    Time 3    Period Weeks    Status On-going    Target Date 01/18/20      PT SHORT TERM GOAL #2   Title Patient will report at least 25% improvement in overall symptoms and/or function to demonstrate improved functional mobility    Time 3    Period Weeks    Status On-going    Target Date 01/18/20      PT SHORT TERM GOAL #3   Title Patient will report improved ease rolling over in bed    Time 3    Period Weeks    Status On-going    Target Date 01/18/20             PT Long Term Goals - 01/04/20 0905      PT LONG TERM GOAL #1   Title Patient will improve on FOTO score to meet predicted outcomes to demonstrate improved functional mobility.    Time 6    Period Weeks    Status On-going      PT LONG TERM GOAL #2   Title Patient will report being able to decrease pain intermittently throuhgout the day with self traction techniques    Time 6    Period Weeks    Status On-going      PT LONG TERM GOAL #3   Title Patient will report at least 50% improvement in overall symptoms and/or function to demonstrate improved functional mobility    Time 6    Period Weeks    Status On-going                 Plan - 01/05/20 1733    Clinical  Impression Statement Aquatic therapy initiated today. Patient educated on application and benefit. Began session with  dynamic warm up and progressed to core activation and strengthening. Progressed to hip and core strengthening progressions using pool dumbbells. Patient tolerated these well, but noted cramping sensation in both thighs toward end of session. Trialed body weight lumbar traction at pool wall, followed by dynamic cool down pool walking which resolved symptoms. Patient will continue to benefit from skilled therapy services to progress core strengthening to reduce pain and improve LOF with ADLs.    Personal Factors and Comorbidities Comorbidity 1;Comorbidity 2    Comorbidities chronic low back pain    Examination-Activity Limitations Bed Mobility;Bend;Squat;Stairs;Stand;Locomotion Level;Transfers    Examination-Participation Restrictions Meal Prep;Driving;Community Activity;Cleaning;Yard Work;Shop    Stability/Clinical Decision Making Stable/Uncomplicated    Rehab Potential Fair    PT Frequency --   1-2x/week for total of 8 visists over 6 week certification period   PT Duration 6 weeks    PT Treatment/Interventions ADLs/Self Care Home Management;Aquatic Therapy;Cryotherapy;Electrical Stimulation;Moist Heat;Traction;Balance training;Therapeutic exercise;Therapeutic activities;Functional mobility training;Gait training;Neuromuscular re-education;Iontophoresis 4mg /ml Dexamethasone;Patient/family education;Manual techniques;Dry needling;Passive range of motion;Joint Manipulations    PT Next Visit Plan aquatics, pain relieving interventions (prone provokes symptoms), TRACTION, gentle ROM and isometrics    PT Home Exercise Plan self traction in chair; 9/29:  ab and glut set, hamstring, knee to chest stretch, LTR and sitting posture.    Consulted and Agree with Plan of Care Patient           Patient will benefit from skilled therapeutic intervention in order to improve the following deficits  and impairments:  Pain, Decreased activity tolerance, Decreased strength, Difficulty walking, Decreased mobility, Decreased range of motion, Postural dysfunction, Improper body mechanics, Hypomobility  Visit Diagnosis: Difficulty in walking, not elsewhere classified  Chronic right-sided low back pain with right-sided sciatica     Problem List Patient Active Problem List   Diagnosis Date Noted  . Choledocholithiasis 11/06/2017  . Bile leak   . RUQ pain   . Chronic systolic CHF (congestive heart failure) ([place]) 09/03/2017  . ACTINIC KERATOSIS 11/01/2009  . Dilated cardiomyopathy ([place]) 06/12/2009  . OSA (obstructive sleep apnea) 05/02/2009  . Morbid obesity ([place]) 01/31/2009  . LBBB (left bundle branch block) 12/25/2008  . HYPERLIPIDEMIA 11/22/2008  . COLONIC POLYPS 10/19/2008  . Essential hypertension 10/19/2008  . GERD 10/19/2008  . DIVERTICULITIS, COLON 10/19/2008    5:45 PM, 01/05/20 Josue Hector PT DPT  Physical Therapist with [facility]  [phone]   [facility] [address] [place], [place], [address] Phone: [phone]   Fax:  [phone]  Name: LLOYDE LUDLAM MRN: [id number] Date of Birth: 1960/04/17

## 2020-01-10 ENCOUNTER — Encounter (HOSPITAL_COMMUNITY): Payer: 59 | Admitting: Physical Therapy

## 2020-01-11 ENCOUNTER — Encounter (HOSPITAL_COMMUNITY): Payer: Self-pay | Admitting: Physical Therapy

## 2020-01-11 ENCOUNTER — Telehealth (HOSPITAL_COMMUNITY): Payer: Self-pay | Admitting: Physical Therapy

## 2020-01-11 ENCOUNTER — Ambulatory Visit (HOSPITAL_COMMUNITY): Payer: 59 | Attending: Physician Assistant | Admitting: Physical Therapy

## 2020-01-11 ENCOUNTER — Other Ambulatory Visit: Payer: Self-pay

## 2020-01-11 DIAGNOSIS — M5441 Lumbago with sciatica, right side: Secondary | ICD-10-CM

## 2020-01-11 DIAGNOSIS — R262 Difficulty in walking, not elsewhere classified: Secondary | ICD-10-CM | POA: Insufficient documentation

## 2020-01-11 DIAGNOSIS — G8929 Other chronic pain: Secondary | ICD-10-CM | POA: Diagnosis present

## 2020-01-11 NOTE — Patient Instructions (Signed)
Access Code: [email or web address] URL: [email or web address] Date: 01/11/2020 Prepared by: Mitzi Hansen Raihana Balderrama  Exercises Supine Bridge - 1 x daily - 7 x weekly - 2 sets - 10 reps Standing Hip Abduction with Counter Support - 1 x daily - 7 x weekly - 2 sets - 10 reps

## 2020-01-11 NOTE — Therapy (Signed)
[place], [place], [address] Phone: [phone]   Fax:  [phone]  Physical Therapy Treatment  Patient Details  Name: Nicholas Gray MRN: [id number] Date of Birth: February 14, 1961 Referring Provider (PT): Corena Herter PA   Encounter Date: 01/11/2020   PT End of Session - 01/11/20 0913    Visit Number 4    Number of Visits 8    Date for PT Re-Evaluation 02/08/20    Authorization Type [employer], VL 30 PT/OT/ chiro    Progress Note Due on Visit 10    PT Start Time 0915    PT Stop Time 0955    PT Time Calculation (min) 40 min    Activity Tolerance Patient tolerated treatment well    Behavior During Therapy [facility] for tasks assessed/performed           Past Medical History:  Diagnosis Date  . Arthritis    gout  . Cardiomyopathy    EF 30-40% (Normal coronaries cath 2010).  Echo 2015 EF 40%  . CHF (congestive heart failure) ([place])   . Colonic polyp   . Diverticulosis of colon   . GERD (gastroesophageal reflux disease)   . History of chicken pox   . HLD (hyperlipidemia)    recent  . HTN (hypertension)    recent  . Sleep apnea    does use CPAP.      Past Surgical History:  Procedure Laterality Date  . BILIARY STENT PLACEMENT  11/08/2017   Procedure: BILIARY STENT PLACEMENT;  Surgeon: Carol Ada, MD;  Location: [facility] ENDOSCOPY;  Service: Endoscopy;;  . BIOPSY  03/08/2018   Procedure: BIOPSY;  Surgeon: Doran Stabler, MD;  Location: [facility] ENDOSCOPY;  Service: Gastroenterology;;  . CARDIAC CATHETERIZATION  2010  . CHOLECYSTECTOMY N/A 09/07/2017   Procedure: LAPAROSCOPIC CHOLECYSTECTOMY;  Surgeon: Kinsinger, Arta Bruce, MD;  Location: [place];  Service: General;  Laterality: N/A;  LAPAROSCOPIC CHOLECYSTECTOMY  . COLONOSCOPY WITH PROPOFOL N/A 03/08/2018   Procedure: COLONOSCOPY WITH PROPOFOL;  Surgeon: Doran Stabler, MD;  Location: [facility];  Service: Gastroenterology;  Laterality: N/A;  . ERCP N/A  09/11/2017   Procedure: ENDOSCOPIC RETROGRADE CHOLANGIOPANCREATOGRAPHY (ERCP);  Surgeon: Doran Stabler, MD;  Location: [place];  Service: Gastroenterology;  Laterality: N/A;  . ERCP N/A 11/08/2017   Procedure: ENDOSCOPIC RETROGRADE CHOLANGIOPANCREATOGRAPHY (ERCP);  Surgeon: Carol Ada, MD;  Location: [place];  Service: Endoscopy;  Laterality: N/A;  . ERCP N/A 01/11/2018   Procedure: ENDOSCOPIC RETROGRADE CHOLANGIOPANCREATOGRAPHY;  Surgeon: Doran Stabler, MD;  Location: [facility];  Service: Gastroenterology;  Laterality: N/A;  With Balloon Sweep of Ducts  . REMOVAL OF STONES  11/08/2017   Procedure: REMOVAL OF STONES;  Surgeon: Carol Ada, MD;  Location: [place];  Service: Endoscopy;;  . RIGHT/LEFT HEART CATH AND CORONARY ANGIOGRAPHY N/A 10/12/2017   Procedure: RIGHT/LEFT HEART CATH AND CORONARY ANGIOGRAPHY;  Surgeon: Larey Dresser, MD;  Location: [place] CV LAB;  Service: Cardiovascular;  Laterality: N/A;  . SPHINCTEROTOMY  11/08/2017   Procedure: SPHINCTEROTOMY;  Surgeon: Carol Ada, MD;  Location: [place];  Service: Endoscopy;;  . STENT REMOVAL  01/11/2018   Procedure: STENT REMOVAL;  Surgeon: Doran Stabler, MD;  Location: [facility];  Service: Gastroenterology;;  . TONSILLECTOMY AND ADENOIDECTOMY      There were no vitals filed for this visit.   Subjective Assessment - 01/11/20 0912    Subjective Patient states his back has  been doing better. He had do some work Monday night which made him a little achy yesterday but he is doing is better today. He has not been doing his exercises much.    How long can you sit comfortably? pain increases after an hour    How long can you stand comfortably? 10 minutes    How long can you walk comfortably? 15    Patient Stated Goals to be able to roll over in bed with less pain    Currently in Pain? Yes    Pain Score 1     Pain Location Back    Pain Onset More than a month ago                              [facility] Adult PT Treatment/Exercise - 01/11/20 0001      Lumbar Exercises: Stretches   Active Hamstring Stretch Right;Left;3 reps;30 seconds    Single Knee to Chest Stretch Right;Left;3 reps;30 seconds    Lower Trunk Rotation 5 reps;10 seconds      Lumbar Exercises: Standing   Row Both;15 reps    Theraband Level (Row) Level 3 (Green)    Row Limitations 2 sets    Shoulder Extension 15 reps;Both    Theraband Level (Shoulder Extension) Level 3 (Green)    Shoulder Extension Limitations 2 sets     Other Standing Lumbar Exercises hip abduction and extension 2x10 each    Other Standing Lumbar Exercises Palof press 2x15 bilateral with green band      Lumbar Exercises: Seated   Sit to Stand 10 reps      Lumbar Exercises: Supine   Glut Set 5 reps    Clam 15 reps    Bridge 10 reps;3 seconds                  PT Education - 01/11/20 0912    Education Details Patient educated on exercise technique and HEP and possible muscle soreness    Person(s) Educated Patient    Methods Explanation;Demonstration;Handout    Comprehension Verbalized understanding;Returned demonstration            PT Short Term Goals - 01/04/20 0903      PT SHORT TERM GOAL #1   Title Patient will be independent in self management strategies to improve quality of life and functional outcomes.    Time 3    Period Weeks    Status On-going    Target Date 01/18/20      PT SHORT TERM GOAL #2   Title Patient will report at least 25% improvement in overall symptoms and/or function to demonstrate improved functional mobility    Time 3    Period Weeks    Status On-going    Target Date 01/18/20      PT SHORT TERM GOAL #3   Title Patient will report improved ease rolling over in bed    Time 3    Period Weeks    Status On-going    Target Date 01/18/20             PT Long Term Goals - 01/04/20 0905      PT LONG TERM GOAL #1   Title Patient will improve on FOTO  score to meet predicted outcomes to demonstrate improved functional mobility.    Time 6    Period Weeks    Status On-going      PT LONG TERM GOAL #  2   Title Patient will report being able to decrease pain intermittently throuhgout the day with self traction techniques    Time 6    Period Weeks    Status On-going      PT LONG TERM GOAL #3   Title Patient will report at least 50% improvement in overall symptoms and/or function to demonstrate improved functional mobility    Time 6    Period Weeks    Status On-going                 Plan - 01/11/20 0913    Clinical Impression Statement Patient requires min cueing for mechanics of prior exercises and stretches. Patient given cueing for prior glute squeeze with bridge with good carry over without c/o increase in symptoms. Patient tolerates additional core and hip strengthening exercises today with fatigue secondary to impaired strength and endurance. Patient given cueing for glute activation on stance leg for improving hip strength and stability with standing hip exercises. Patient requires cueing for posture and core activation with palof exercise for core strength. Patient will continue to benefit from skilled physical therapy in order to reduce impairment and improve function.    Personal Factors and Comorbidities Comorbidity 1;Comorbidity 2    Comorbidities chronic low back pain    Examination-Activity Limitations Bed Mobility;Bend;Squat;Stairs;Stand;Locomotion Level;Transfers    Examination-Participation Restrictions Meal Prep;Driving;Community Activity;Cleaning;Yard Work;Shop    Stability/Clinical Decision Making Stable/Uncomplicated    Rehab Potential Fair    PT Frequency --   1-2x/week for total of 8 visists over 6 week certification period   PT Duration 6 weeks    PT Treatment/Interventions ADLs/Self Care Home Management;Aquatic Therapy;Cryotherapy;Electrical Stimulation;Moist Heat;Traction;Balance training;Therapeutic  exercise;Therapeutic activities;Functional mobility training;Gait training;Neuromuscular re-education;Iontophoresis 4mg /ml Dexamethasone;Patient/family education;Manual techniques;Dry needling;Passive range of motion;Joint Manipulations    PT Next Visit Plan aquatics, pain relieving interventions (prone provokes symptoms), TRACTION, gentle ROM and isometrics    PT Home Exercise Plan self traction in chair; 9/29:  ab and glut set, hamstring, knee to chest stretch, LTR and sitting posture. 10/6 bridge standing hip abduction    Consulted and Agree with Plan of Care Patient           Patient will benefit from skilled therapeutic intervention in order to improve the following deficits and impairments:  Pain, Decreased activity tolerance, Decreased strength, Difficulty walking, Decreased mobility, Decreased range of motion, Postural dysfunction, Improper body mechanics, Hypomobility  Visit Diagnosis: Difficulty in walking, not elsewhere classified  Chronic right-sided low back pain with right-sided sciatica     Problem List Patient Active Problem List   Diagnosis Date Noted  . Choledocholithiasis 11/06/2017  . Bile leak   . RUQ pain   . Chronic systolic CHF (congestive heart failure) ([place]) 09/03/2017  . ACTINIC KERATOSIS 11/01/2009  . Dilated cardiomyopathy ([place]) 06/12/2009  . OSA (obstructive sleep apnea) 05/02/2009  . Morbid obesity ([place]) 01/31/2009  . LBBB (left bundle branch block) 12/25/2008  . HYPERLIPIDEMIA 11/22/2008  . COLONIC POLYPS 10/19/2008  . Essential hypertension 10/19/2008  . GERD 10/19/2008  . DIVERTICULITIS, COLON 10/19/2008    9:58 AM, 01/11/20 Mearl Latin PT, DPT Physical Therapist at [place], [place], [address] Phone: [phone]   Fax:  [phone]  Name: Nicholas Gray MRN: [id number] Date of Birth: Jan 02, 1961

## 2020-01-11 NOTE — Telephone Encounter (Signed)
l/m to let patient know the pool is closed and they will have PT here in the office with Lysbeth Galas. NF 01/11/2020

## 2020-01-12 ENCOUNTER — Ambulatory Visit (HOSPITAL_COMMUNITY): Payer: 59 | Admitting: Physical Therapy

## 2020-01-12 DIAGNOSIS — M5441 Lumbago with sciatica, right side: Secondary | ICD-10-CM

## 2020-01-12 DIAGNOSIS — R262 Difficulty in walking, not elsewhere classified: Secondary | ICD-10-CM | POA: Diagnosis not present

## 2020-01-12 NOTE — Therapy (Signed)
[place], [place], [address] Phone: [phone]   Fax:  [phone]  Physical Therapy Treatment  Patient Details  Name: Nicholas Gray MRN: [id number] Date of Birth: Nov 06, 1960 Referring Provider (PT): Corena Herter PA   Encounter Date: 01/12/2020   PT End of Session - 01/12/20 1510    Visit Number 5    Number of Visits 8    Date for PT Re-Evaluation 02/08/20    Authorization Type [employer], VL 30 PT/OT/ chiro    Progress Note Due on Visit 10    PT Start Time 1505    PT Stop Time 1600    PT Time Calculation (min) 55 min    Activity Tolerance Patient tolerated treatment well    Behavior During Therapy [facility] for tasks assessed/performed           Past Medical History:  Diagnosis Date  . Arthritis    gout  . Cardiomyopathy    EF 30-40% (Normal coronaries cath 2010).  Echo 2015 EF 40%  . CHF (congestive heart failure) ([place])   . Colonic polyp   . Diverticulosis of colon   . GERD (gastroesophageal reflux disease)   . History of chicken pox   . HLD (hyperlipidemia)    recent  . HTN (hypertension)    recent  . Sleep apnea    does use CPAP.      Past Surgical History:  Procedure Laterality Date  . BILIARY STENT PLACEMENT  11/08/2017   Procedure: BILIARY STENT PLACEMENT;  Surgeon: Carol Ada, MD;  Location: [facility] ENDOSCOPY;  Service: Endoscopy;;  . BIOPSY  03/08/2018   Procedure: BIOPSY;  Surgeon: Doran Stabler, MD;  Location: [facility];  Service: Gastroenterology;;  . CARDIAC CATHETERIZATION  2010  . CHOLECYSTECTOMY N/A 09/07/2017   Procedure: LAPAROSCOPIC CHOLECYSTECTOMY;  Surgeon: Kinsinger, Arta Bruce, MD;  Location: [place];  Service: General;  Laterality: N/A;  LAPAROSCOPIC CHOLECYSTECTOMY  . COLONOSCOPY WITH PROPOFOL N/A 03/08/2018   Procedure: COLONOSCOPY WITH PROPOFOL;  Surgeon: Doran Stabler, MD;  [facility];  Service: Gastroenterology;  Laterality: N/A;  . ERCP N/A  09/11/2017   Procedure: ENDOSCOPIC RETROGRADE CHOLANGIOPANCREATOGRAPHY (ERCP);  Surgeon: Doran Stabler, MD;  Location: [place];  Service: Gastroenterology;  Laterality: N/A;  . ERCP N/A 11/08/2017   Procedure: ENDOSCOPIC RETROGRADE CHOLANGIOPANCREATOGRAPHY (ERCP);  Surgeon: Carol Ada, MD;  Location: [place];  Service: Endoscopy;  Laterality: N/A;  . ERCP N/A 01/11/2018   Procedure: ENDOSCOPIC RETROGRADE CHOLANGIOPANCREATOGRAPHY;  Surgeon: Doran Stabler, MD;  Location: [facility];  Service: Gastroenterology;  Laterality: N/A;  With Balloon Sweep of Ducts  . REMOVAL OF STONES  11/08/2017   Procedure: REMOVAL OF STONES;  Surgeon: Carol Ada, MD;  Location: [place];  Service: Endoscopy;;  . RIGHT/LEFT HEART CATH AND CORONARY ANGIOGRAPHY N/A 10/12/2017   Procedure: RIGHT/LEFT HEART CATH AND CORONARY ANGIOGRAPHY;  Surgeon: Larey Dresser, MD;  Location: [place] CV LAB;  Service: Cardiovascular;  Laterality: N/A;  . SPHINCTEROTOMY  11/08/2017   Procedure: SPHINCTEROTOMY;  Surgeon: Carol Ada, MD;  Location: [place];  Service: Endoscopy;;  . STENT REMOVAL  01/11/2018   Procedure: STENT REMOVAL;  Surgeon: Doran Stabler, MD;  Location: [facility];  Service: Gastroenterology;;  . TONSILLECTOMY AND ADENOIDECTOMY      There were no vitals filed for this visit.   Subjective Assessment - 01/12/20 1509    Subjective Patient says his back is  feeling much better today. Says his glutes are feeling pretty sore after exercise from yesterday's visit.    How long can you sit comfortably? pain increases after an hour    How long can you stand comfortably? 10 minutes    How long can you walk comfortably? 15    Patient Stated Goals to be able to roll over in bed with less pain    Currently in Pain? Yes    Pain Score 1     Pain Location Back    Pain Orientation Lower;Posterior    Pain Descriptors / Indicators Aching    Pain Type Chronic pain    Pain Onset More than a  month ago                             [facility] Adult PT Treatment/Exercise - 01/12/20 0001      Lumbar Exercises: Stretches   Active Hamstring Stretch Right;Left;5 reps;10 seconds    Single Knee to Chest Stretch Right;Left;5 reps;10 seconds      Lumbar Exercises: Seated   Sit to Stand 20 reps   2 x 10 with red med ball reach out      Lumbar Exercises: Supine   Ab Set 15 reps    Bent Knee Raise 20 reps    Bridge 20 reps;5 seconds    Straight Leg Raise 20 reps   with ab set      Traction   Type of Traction Lumbar    Min (lbs) 0    Max (lbs) 80    Hold Time 1 min    Time 10 min                     PT Short Term Goals - 01/04/20 6283      PT SHORT TERM GOAL #1   Title Patient will be independent in self management strategies to improve quality of life and functional outcomes.    Time 3    Period Weeks    Status On-going    Target Date 01/18/20      PT SHORT TERM GOAL #2   Title Patient will report at least 25% improvement in overall symptoms and/or function to demonstrate improved functional mobility    Time 3    Period Weeks    Status On-going    Target Date 01/18/20      PT SHORT TERM GOAL #3   Title Patient will report improved ease rolling over in bed    Time 3    Period Weeks    Status On-going    Target Date 01/18/20             PT Long Term Goals - 01/04/20 0905      PT LONG TERM GOAL #1   Title Patient will improve on FOTO score to meet predicted outcomes to demonstrate improved functional mobility.    Time 6    Period Weeks    Status On-going      PT LONG TERM GOAL #2   Title Patient will report being able to decrease pain intermittently throuhgout the day with self traction techniques    Time 6    Period Weeks    Status On-going      PT LONG TERM GOAL #3   Title Patient will report at least 50% improvement in overall symptoms and/or function to demonstrate improved functional mobility    Time 6  Period Weeks     Status On-going                 Plan - 01/12/20 1616    Clinical Impression Statement Patient tolerated session well today with no increased complaint of pain. Added bent knee marches and med ball reach during sit to stands for core strength progressions. Performed mechanical traction to lumbar. Patient reports improved symptoms overall post session. Patient cued on proper form and function with all added activity today.    Personal Factors and Comorbidities Comorbidity 1;Comorbidity 2    Comorbidities chronic low back pain    Examination-Activity Limitations Bed Mobility;Bend;Squat;Stairs;Stand;Locomotion Level;Transfers    Examination-Participation Restrictions Meal Prep;Driving;Community Activity;Cleaning;Yard Work;Shop    Stability/Clinical Decision Making Stable/Uncomplicated    Rehab Potential Fair    PT Frequency --   1-2x/week for total of 8 visists over 6 week certification period   PT Duration 6 weeks    PT Treatment/Interventions ADLs/Self Care Home Management;Aquatic Therapy;Cryotherapy;Electrical Stimulation;Moist Heat;Traction;Balance training;Therapeutic exercise;Therapeutic activities;Functional mobility training;Gait training;Neuromuscular re-education;Iontophoresis 4mg /ml Dexamethasone;Patient/family education;Manual techniques;Dry needling;Passive range of motion;Joint Manipulations    PT Next Visit Plan aquatics, pain relieving interventions (prone provokes symptoms), TRACTION, gentle ROM and isometrics    PT Home Exercise Plan self traction in chair; 9/29:  ab and glut set, hamstring, knee to chest stretch, LTR and sitting posture. 10/6 bridge standing hip abduction    Consulted and Agree with Plan of Care Patient           Patient will benefit from skilled therapeutic intervention in order to improve the following deficits and impairments:  Pain, Decreased activity tolerance, Decreased strength, Difficulty walking, Decreased mobility, Decreased range of motion,  Postural dysfunction, Improper body mechanics, Hypomobility  Visit Diagnosis: Difficulty in walking, not elsewhere classified  Chronic right-sided low back pain with right-sided sciatica     Problem List Patient Active Problem List   Diagnosis Date Noted  . Choledocholithiasis 11/06/2017  . Bile leak   . RUQ pain   . Chronic systolic CHF (congestive heart failure) ([place]) 09/03/2017  . ACTINIC KERATOSIS 11/01/2009  . Dilated cardiomyopathy ([place]) 06/12/2009  . OSA (obstructive sleep apnea) 05/02/2009  . Morbid obesity ([place]) 01/31/2009  . LBBB (left bundle branch block) 12/25/2008  . HYPERLIPIDEMIA 11/22/2008  . COLONIC POLYPS 10/19/2008  . Essential hypertension 10/19/2008  . GERD 10/19/2008  . DIVERTICULITIS, COLON 10/19/2008    4:19 PM, 01/12/20 Josue Hector PT DPT  Physical Therapist with [facility]  [phone]   [facility] [address] [place], [place], [address] Phone: [phone]   Fax:  [phone]  Name: JAWARA LATORRE MRN: [id number] Date of Birth: May 14, 1960

## 2020-01-17 ENCOUNTER — Ambulatory Visit (HOSPITAL_COMMUNITY): Payer: 59 | Admitting: Physical Therapy

## 2020-01-17 ENCOUNTER — Other Ambulatory Visit: Payer: Self-pay

## 2020-01-17 DIAGNOSIS — R262 Difficulty in walking, not elsewhere classified: Secondary | ICD-10-CM

## 2020-01-17 NOTE — Therapy (Signed)
[place], [place], [address] Phone: [phone]   Fax:  [phone]  Physical Therapy Treatment  Patient Details  Name: Nicholas Gray MRN: [id number] Date of Birth: 06/06/1960 Referring Provider (PT): Corena Herter PA   Encounter Date: 01/17/2020   PT End of Session - 01/17/20 1224    Visit Number 6    Number of Visits 8    Date for PT Re-Evaluation 02/08/20    Authorization Type [employer], VL 30 PT/OT/ chiro    Progress Note Due on Visit 10    PT Start Time 1054    PT Stop Time 1140    PT Time Calculation (min) 46 min    Activity Tolerance Patient tolerated treatment well    Behavior During Therapy [facility] for tasks assessed/performed           Past Medical History:  Diagnosis Date  . Arthritis    gout  . Cardiomyopathy    EF 30-40% (Normal coronaries cath 2010).  Echo 2015 EF 40%  . CHF (congestive heart failure) ([place])   . Colonic polyp   . Diverticulosis of colon   . GERD (gastroesophageal reflux disease)   . History of chicken pox   . HLD (hyperlipidemia)    recent  . HTN (hypertension)    recent  . Sleep apnea    does use CPAP.      Past Surgical History:  Procedure Laterality Date  . BILIARY STENT PLACEMENT  11/08/2017   Procedure: BILIARY STENT PLACEMENT;  Surgeon: Carol Ada, MD;  Location: [facility] ENDOSCOPY;  Service: Endoscopy;;  . BIOPSY  03/08/2018   Procedure: BIOPSY;  Surgeon: Doran Stabler, MD;  Location: [facility] ENDOSCOPY;  Service: Gastroenterology;;  . CARDIAC CATHETERIZATION  2010  . CHOLECYSTECTOMY N/A 09/07/2017   Procedure: LAPAROSCOPIC CHOLECYSTECTOMY;  Surgeon: Kinsinger, Arta Bruce, MD;  Location: [place];  Service: General;  Laterality: N/A;  LAPAROSCOPIC CHOLECYSTECTOMY  . COLONOSCOPY WITH PROPOFOL N/A 03/08/2018   Procedure: COLONOSCOPY WITH PROPOFOL;  Surgeon: Doran Stabler, MD;  Location: [facility] ENDOSCOPY;  Service: Gastroenterology;  Laterality: N/A;  . ERCP N/A  09/11/2017   Procedure: ENDOSCOPIC RETROGRADE CHOLANGIOPANCREATOGRAPHY (ERCP);  Surgeon: Doran Stabler, MD;  Location: [place];  Service: Gastroenterology;  Laterality: N/A;  . ERCP N/A 11/08/2017   Procedure: ENDOSCOPIC RETROGRADE CHOLANGIOPANCREATOGRAPHY (ERCP);  Surgeon: Carol Ada, MD;  Location: [place];  Service: Endoscopy;  Laterality: N/A;  . ERCP N/A 01/11/2018   Procedure: ENDOSCOPIC RETROGRADE CHOLANGIOPANCREATOGRAPHY;  Surgeon: Doran Stabler, MD;  Location: [facility] ENDOSCOPY;  Service: Gastroenterology;  Laterality: N/A;  With Balloon Sweep of Ducts  . REMOVAL OF STONES  11/08/2017   Procedure: REMOVAL OF STONES;  Surgeon: Carol Ada, MD;  Location: [place];  Service: Endoscopy;;  . RIGHT/LEFT HEART CATH AND CORONARY ANGIOGRAPHY N/A 10/12/2017   Procedure: RIGHT/LEFT HEART CATH AND CORONARY ANGIOGRAPHY;  Surgeon: Larey Dresser, MD;  Location: [place] CV LAB;  Service: Cardiovascular;  Laterality: N/A;  . SPHINCTEROTOMY  11/08/2017   Procedure: SPHINCTEROTOMY;  Surgeon: Carol Ada, MD;  Location: [place];  Service: Endoscopy;;  . STENT REMOVAL  01/11/2018   Procedure: STENT REMOVAL;  Surgeon: Doran Stabler, MD;  Location: [facility] ENDOSCOPY;  Service: Gastroenterology;;  . TONSILLECTOMY AND ADENOIDECTOMY      There were no vitals filed for this visit.   Subjective Assessment - 01/17/20 1058    Subjective pt reports minimal back pain  today at 1/10    Currently in Pain? Yes    Pain Score 1     Pain Location Back    Pain Orientation Lower;Posterior                             [facility] Adult PT Treatment/Exercise - 01/17/20 0001      Lumbar Exercises: Stretches   Active Hamstring Stretch Right;Left;3 reps;30 seconds    Single Knee to Chest Stretch Left;Right;3 reps;30 seconds    Lower Trunk Rotation 5 reps;10 seconds      Lumbar Exercises: Seated   Sit to Stand 20 reps      Lumbar Exercises: Supine   Bent Knee Raise 20 reps     Bridge 20 reps;5 seconds    Straight Leg Raise 20 reps      Traction   Type of Traction Lumbar    Min (lbs) 0    Max (lbs) 100   pt's weight is 280# so max is 140#   Hold Time static    Time 12 min                     PT Short Term Goals - 01/04/20 2500      PT SHORT TERM GOAL #1   Title Patient will be independent in self management strategies to improve quality of life and functional outcomes.    Time 3    Period Weeks    Status On-going    Target Date 01/18/20      PT SHORT TERM GOAL #2   Title Patient will report at least 25% improvement in overall symptoms and/or function to demonstrate improved functional mobility    Time 3    Period Weeks    Status On-going    Target Date 01/18/20      PT SHORT TERM GOAL #3   Title Patient will report improved ease rolling over in bed    Time 3    Period Weeks    Status On-going    Target Date 01/18/20             PT Long Term Goals - 01/04/20 0905      PT LONG TERM GOAL #1   Title Patient will improve on FOTO score to meet predicted outcomes to demonstrate improved functional mobility.    Time 6    Period Weeks    Status On-going      PT LONG TERM GOAL #2   Title Patient will report being able to decrease pain intermittently throuhgout the day with self traction techniques    Time 6    Period Weeks    Status On-going      PT LONG TERM GOAL #3   Title Patient will report at least 50% improvement in overall symptoms and/or function to demonstrate improved functional mobility    Time 6    Period Weeks    Status On-going                 Plan - 01/17/20 1227    Clinical Impression Statement Continued with lumbar stabilization, hamstring and hip stretches.  Pt requires cues for stabilization and controlled slow movements.  Increased max pull with traction today to 100# as pt's weight is 280#. Pt reported he received a much better stretch with this weight.   Pt without any change in pain at end of  session today.    Personal  Factors and Comorbidities Comorbidity 1;Comorbidity 2    Comorbidities chronic low back pain    Examination-Activity Limitations Bed Mobility;Bend;Squat;Stairs;Stand;Locomotion Level;Transfers    Examination-Participation Restrictions Meal Prep;Driving;Community Activity;Cleaning;Yard Work;Shop    Stability/Clinical Decision Making Stable/Uncomplicated    Rehab Potential Fair    PT Frequency --   1-2x/week for total of 8 visists over 6 week certification period   PT Duration 6 weeks    PT Treatment/Interventions ADLs/Self Care Home Management;Aquatic Therapy;Cryotherapy;Electrical Stimulation;Moist Heat;Traction;Balance training;Therapeutic exercise;Therapeutic activities;Functional mobility training;Gait training;Neuromuscular re-education;Iontophoresis 4mg /ml Dexamethasone;Patient/family education;Manual techniques;Dry needling;Passive range of motion;Joint Manipulations    PT Next Visit Plan aquatics, pain relieving interventions (prone provokes symptoms), TRACTION, gentle ROM and isometrics    PT Home Exercise Plan self traction in chair; 9/29:  ab and glut set, hamstring, knee to chest stretch, LTR and sitting posture. 10/6 bridge standing hip abduction    Consulted and Agree with Plan of Care Patient           Patient will benefit from skilled therapeutic intervention in order to improve the following deficits and impairments:  Pain, Decreased activity tolerance, Decreased strength, Difficulty walking, Decreased mobility, Decreased range of motion, Postural dysfunction, Improper body mechanics, Hypomobility  Visit Diagnosis: Difficulty in walking, not elsewhere classified     Problem List Patient Active Problem List   Diagnosis Date Noted  . Choledocholithiasis 11/06/2017  . Bile leak   . RUQ pain   . Chronic systolic CHF (congestive heart failure) ([place]) 09/03/2017  . ACTINIC KERATOSIS 11/01/2009  . Dilated cardiomyopathy ([place]) 06/12/2009  . OSA  (obstructive sleep apnea) 05/02/2009  . Morbid obesity ([place]) 01/31/2009  . LBBB (left bundle branch block) 12/25/2008  . HYPERLIPIDEMIA 11/22/2008  . COLONIC POLYPS 10/19/2008  . Essential hypertension 10/19/2008  . GERD 10/19/2008  . DIVERTICULITIS, COLON 10/19/2008   Teena Irani, PTA/CLT [phone]  Teena Irani 01/17/2020, 12:30 PM  [place] [address] [place], [place], [address] Phone: [phone]   Fax:  [phone]  Name: Nicholas Gray MRN: [id number] Date of Birth: [birth year]

## 2020-01-19 ENCOUNTER — Encounter (HOSPITAL_COMMUNITY): Payer: Self-pay | Admitting: Physical Therapy

## 2020-01-19 ENCOUNTER — Other Ambulatory Visit: Payer: Self-pay

## 2020-01-19 ENCOUNTER — Ambulatory Visit (HOSPITAL_COMMUNITY): Payer: 59 | Admitting: Physical Therapy

## 2020-01-19 DIAGNOSIS — G8929 Other chronic pain: Secondary | ICD-10-CM

## 2020-01-19 DIAGNOSIS — R262 Difficulty in walking, not elsewhere classified: Secondary | ICD-10-CM | POA: Diagnosis not present

## 2020-01-19 NOTE — Therapy (Signed)
[place], [place], [address] Phone: [phone]   Fax:  [phone]  Physical Therapy Treatment  Patient Details  Name: Nicholas Gray MRN: [id number] Date of Birth: 03-06-1961 Referring Provider (PT): Corena Herter PA   Encounter Date: 01/19/2020   PT End of Session - 01/19/20 1514    Visit Number 7    Number of Visits 8    Date for PT Re-Evaluation 02/08/20    Authorization Type [employer], VL 30 PT/OT/ chiro    Progress Note Due on Visit 10    PT Start Time 1506    PT Stop Time 1555    PT Time Calculation (min) 49 min    Activity Tolerance Patient tolerated treatment well    Behavior During Therapy [facility] for tasks assessed/performed           Past Medical History:  Diagnosis Date  . Arthritis    gout  . Cardiomyopathy    EF 30-40% (Normal coronaries cath 2010).  Echo 2015 EF 40%  . CHF (congestive heart failure) ([place])   . Colonic polyp   . Diverticulosis of colon   . GERD (gastroesophageal reflux disease)   . History of chicken pox   . HLD (hyperlipidemia)    recent  . HTN (hypertension)    recent  . Sleep apnea    does use CPAP.      Past Surgical History:  Procedure Laterality Date  . BILIARY STENT PLACEMENT  11/08/2017   Procedure: BILIARY STENT PLACEMENT;  Surgeon: Carol Ada, MD;  Location: [facility] ENDOSCOPY;  Service: Endoscopy;;  . BIOPSY  03/08/2018   Procedure: BIOPSY;  Surgeon: Doran Stabler, MD;  [facility] ENDOSCOPY;  Service: Gastroenterology;;  . CARDIAC CATHETERIZATION  2010  . CHOLECYSTECTOMY N/A 09/07/2017   Procedure: LAPAROSCOPIC CHOLECYSTECTOMY;  Surgeon: Kinsinger, Arta Bruce, MD;  Location: [place];  Service: General;  Laterality: N/A;  LAPAROSCOPIC CHOLECYSTECTOMY  . COLONOSCOPY WITH PROPOFOL N/A 03/08/2018   Procedure: COLONOSCOPY WITH PROPOFOL;  Surgeon: Doran Stabler, MD;  [facility] ENDOSCOPY;  Service: Gastroenterology;  Laterality: N/A;  . ERCP N/A  09/11/2017   Procedure: ENDOSCOPIC RETROGRADE CHOLANGIOPANCREATOGRAPHY (ERCP);  Surgeon: Doran Stabler, MD;  Location: [place];  Service: Gastroenterology;  Laterality: N/A;  . ERCP N/A 11/08/2017   Procedure: ENDOSCOPIC RETROGRADE CHOLANGIOPANCREATOGRAPHY (ERCP);  Surgeon: Carol Ada, MD;  Location: [place];  Service: Endoscopy;  Laterality: N/A;  . ERCP N/A 01/11/2018   Procedure: ENDOSCOPIC RETROGRADE CHOLANGIOPANCREATOGRAPHY;  Surgeon: Doran Stabler, MD;  Location: [facility] ENDOSCOPY;  Service: Gastroenterology;  Laterality: N/A;  With Balloon Sweep of Ducts  . REMOVAL OF STONES  11/08/2017   Procedure: REMOVAL OF STONES;  Surgeon: Carol Ada, MD;  Location: [place];  Service: Endoscopy;;  . RIGHT/LEFT HEART CATH AND CORONARY ANGIOGRAPHY N/A 10/12/2017   Procedure: RIGHT/LEFT HEART CATH AND CORONARY ANGIOGRAPHY;  Surgeon: Larey Dresser, MD;  Location: [place] CV LAB;  Service: Cardiovascular;  Laterality: N/A;  . SPHINCTEROTOMY  11/08/2017   Procedure: SPHINCTEROTOMY;  Surgeon: Carol Ada, MD;  Location: [place];  Service: Endoscopy;;  . STENT REMOVAL  01/11/2018   Procedure: STENT REMOVAL;  Surgeon: Doran Stabler, MD;  Location: [facility] ENDOSCOPY;  Service: Gastroenterology;;  . TONSILLECTOMY AND ADENOIDECTOMY      There were no vitals filed for this visit.   Subjective Assessment - 01/19/20 1513    Subjective Patient says his back is  improving. Says pain seems no longer constant and is more intermittent now.    Currently in Pain? Yes    Pain Score 1     Pain Location Back    Pain Orientation Lower;Posterior    Pain Descriptors / Indicators Aching    Pain Type Chronic pain    Pain Onset More than a month ago    Pain Frequency Intermittent                             [facility] Adult PT Treatment/Exercise - 01/19/20 0001      Lumbar Exercises: Stretches   Single Knee to Chest Stretch Left;Right;3 reps;30 seconds    Lower Trunk Rotation  5 reps;10 seconds      Lumbar Exercises: Seated   Sit to Stand 20 reps    Sit to Stand Limitations 2 x 10 with yellow med ball hold       Lumbar Exercises: Supine   Ab Set 15 reps    Bent Knee Raise 20 reps    Bridge 20 reps;5 seconds    Straight Leg Raise 20 reps      Traction   Type of Traction Lumbar    Min (lbs) 0    Max (lbs) 100    Hold Time static    Time 10                    PT Short Term Goals - 01/04/20 0903      PT SHORT TERM GOAL #1   Title Patient will be independent in self management strategies to improve quality of life and functional outcomes.    Time 3    Period Weeks    Status On-going    Target Date 01/18/20      PT SHORT TERM GOAL #2   Title Patient will report at least 25% improvement in overall symptoms and/or function to demonstrate improved functional mobility    Time 3    Period Weeks    Status On-going    Target Date 01/18/20      PT SHORT TERM GOAL #3   Title Patient will report improved ease rolling over in bed    Time 3    Period Weeks    Status On-going    Target Date 01/18/20             PT Long Term Goals - 01/04/20 0905      PT LONG TERM GOAL #1   Title Patient will improve on FOTO score to meet predicted outcomes to demonstrate improved functional mobility.    Time 6    Period Weeks    Status On-going      PT LONG TERM GOAL #2   Title Patient will report being able to decrease pain intermittently throuhgout the day with self traction techniques    Time 6    Period Weeks    Status On-going      PT LONG TERM GOAL #3   Title Patient will report at least 50% improvement in overall symptoms and/or function to demonstrate improved functional mobility    Time 6    Period Weeks    Status On-going                 Plan - 01/19/20 1743    Clinical Impression Statement Patient making good progress toward therapy goals. Patient tolerated session well with no increased complaint of pain. Patient  cued on  hold times with SKTC stretching. Patient cued on proper form and mechanics with added sit/stands with med ball holds. Patient reports no pain post treatment.    Personal Factors and Comorbidities Comorbidity 1;Comorbidity 2    Comorbidities chronic low back pain    Examination-Activity Limitations Bed Mobility;Bend;Squat;Stairs;Stand;Locomotion Level;Transfers    Examination-Participation Restrictions Meal Prep;Driving;Community Activity;Cleaning;Yard Work;Shop    Stability/Clinical Decision Making Stable/Uncomplicated    Rehab Potential Fair    PT Frequency --   1-2x/week for total of 8 visists over 6 week certification period   PT Duration 6 weeks    PT Treatment/Interventions ADLs/Self Care Home Management;Aquatic Therapy;Cryotherapy;Electrical Stimulation;Moist Heat;Traction;Balance training;Therapeutic exercise;Therapeutic activities;Functional mobility training;Gait training;Neuromuscular re-education;Iontophoresis 4mg /ml Dexamethasone;Patient/family education;Manual techniques;Dry needling;Passive range of motion;Joint Manipulations    PT Next Visit Plan aquatics, pain relieving interventions (prone provokes symptoms), TRACTION, gentle ROM and isometrics    PT Home Exercise Plan self traction in chair; 9/29:  ab and glut set, hamstring, knee to chest stretch, LTR and sitting posture. 10/6 bridge standing hip abduction    Consulted and Agree with Plan of Care Patient           Patient will benefit from skilled therapeutic intervention in order to improve the following deficits and impairments:  Pain, Decreased activity tolerance, Decreased strength, Difficulty walking, Decreased mobility, Decreased range of motion, Postural dysfunction, Improper body mechanics, Hypomobility  Visit Diagnosis: Difficulty in walking, not elsewhere classified  Chronic right-sided low back pain with right-sided sciatica     Problem List Patient Active Problem List   Diagnosis Date Noted  .  Choledocholithiasis 11/06/2017  . Bile leak   . RUQ pain   . Chronic systolic CHF (congestive heart failure) ([place]) 09/03/2017  . ACTINIC KERATOSIS 11/01/2009  . Dilated cardiomyopathy ([place]) 06/12/2009  . OSA (obstructive sleep apnea) 05/02/2009  . Morbid obesity ([place]) 01/31/2009  . LBBB (left bundle branch block) 12/25/2008  . HYPERLIPIDEMIA 11/22/2008  . COLONIC POLYPS 10/19/2008  . Essential hypertension 10/19/2008  . GERD 10/19/2008  . DIVERTICULITIS, COLON 10/19/2008   5:57 PM, 01/19/20 Josue Hector PT DPT  Physical Therapist with [facility]  [phone]   [facility] [address] [place], [place], [address] Phone: [phone]   Fax:  [phone]  Name: Nicholas Gray MRN: [id number] Date of Birth: 1960/08/07

## 2020-01-24 ENCOUNTER — Other Ambulatory Visit: Payer: Self-pay

## 2020-01-24 ENCOUNTER — Ambulatory Visit (HOSPITAL_COMMUNITY): Payer: 59 | Admitting: Physical Therapy

## 2020-01-24 ENCOUNTER — Encounter (HOSPITAL_COMMUNITY): Payer: Self-pay | Admitting: Physical Therapy

## 2020-01-24 DIAGNOSIS — R262 Difficulty in walking, not elsewhere classified: Secondary | ICD-10-CM | POA: Diagnosis not present

## 2020-01-24 DIAGNOSIS — G8929 Other chronic pain: Secondary | ICD-10-CM

## 2020-01-24 NOTE — Therapy (Signed)
[facility] [place], [place], [address] Phone: [phone]   Fax:  [phone]  Physical Therapy Treatment  Patient Details  Name: Nicholas Gray MRN: [id number] Date of Birth: Oct 15, 1960 Referring Provider (PT): Corena Herter PA  PHYSICAL THERAPY DISCHARGE SUMMARY  Visits from Start of Care: 8  Current functional level related to goals / functional outcomes: All goals met   Remaining deficits: All goals met    Education / Equipment: HEP Plan: Patient agrees to discharge.  Patient goals were not met.                                                  meeting the stated rehab goals.  ?????       Encounter Date: 01/24/2020   PT End of Session - 01/24/20 0936    Visit Number 8    Number of Visits 8    Date for PT Re-Evaluation 02/08/20    Authorization Type [employer], VL 30 PT/OT/ chiro    PT Start Time 0920    PT Stop Time 0937    PT Time Calculation (min) 17 min    Activity Tolerance Patient tolerated treatment well    Behavior During Therapy [facility] for tasks assessed/performed           Past Medical History:  Diagnosis Date  . Arthritis    gout  . Cardiomyopathy    EF 30-40% (Normal coronaries cath 2010).  Echo 2015 EF 40%  . CHF (congestive heart failure) ([place])   . Colonic polyp   . Diverticulosis of colon   . GERD (gastroesophageal reflux disease)   . History of chicken pox   . HLD (hyperlipidemia)    recent  . HTN (hypertension)    recent  . Sleep apnea    does use CPAP.      Past Surgical History:  Procedure Laterality Date  . BILIARY STENT PLACEMENT  11/08/2017   Procedure: BILIARY STENT PLACEMENT;  Surgeon: Carol Ada, MD;  Location: [facility] ENDOSCOPY;  Service: Endoscopy;;  . BIOPSY  03/08/2018   Procedure: BIOPSY;  Surgeon: Doran Stabler, MD;  Location: [facility] ENDOSCOPY;  Service: Gastroenterology;;  . CARDIAC CATHETERIZATION  2010  . CHOLECYSTECTOMY N/A 09/07/2017   Procedure:  LAPAROSCOPIC CHOLECYSTECTOMY;  Surgeon: Kinsinger, Arta Bruce, MD;  Location: [place];  Service: General;  Laterality: N/A;  LAPAROSCOPIC CHOLECYSTECTOMY  . COLONOSCOPY WITH PROPOFOL N/A 03/08/2018   Procedure: COLONOSCOPY WITH PROPOFOL;  Surgeon: Doran Stabler, MD;  Location: [facility] ENDOSCOPY;  Service: Gastroenterology;  Laterality: N/A;  . ERCP N/A 09/11/2017   Procedure: ENDOSCOPIC RETROGRADE CHOLANGIOPANCREATOGRAPHY (ERCP);  Surgeon: Doran Stabler, MD;  Location: [place];  Service: Gastroenterology;  Laterality: N/A;  . ERCP N/A 11/08/2017   Procedure: ENDOSCOPIC RETROGRADE CHOLANGIOPANCREATOGRAPHY (ERCP);  Surgeon: Carol Ada, MD;  Location: [place];  Service: Endoscopy;  Laterality: N/A;  . ERCP N/A 01/11/2018   Procedure: ENDOSCOPIC RETROGRADE CHOLANGIOPANCREATOGRAPHY;  Surgeon: Doran Stabler, MD;  Location: [facility] ENDOSCOPY;  Service: Gastroenterology;  Laterality: N/A;  With Balloon Sweep of Ducts  . REMOVAL OF STONES  11/08/2017   Procedure: REMOVAL OF STONES;  Surgeon: Carol Ada, MD;  Location: [place];  Service: Endoscopy;;  . RIGHT/LEFT HEART CATH AND CORONARY ANGIOGRAPHY N/A 10/12/2017   Procedure: RIGHT/LEFT HEART CATH  AND CORONARY ANGIOGRAPHY;  Surgeon: Larey Dresser, MD;  Location: [place] CV LAB;  Service: Cardiovascular;  Laterality: N/A;  . SPHINCTEROTOMY  11/08/2017   Procedure: SPHINCTEROTOMY;  Surgeon: Carol Ada, MD;  Location: [facility];  Service: Endoscopy;;  . STENT REMOVAL  01/11/2018   Procedure: STENT REMOVAL;  Surgeon: Doran Stabler, MD;  Location: [facility] ENDOSCOPY;  Service: Gastroenterology;;  . TONSILLECTOMY AND ADENOIDECTOMY      There were no vitals filed for this visit.   Subjective Assessment - 01/24/20 0919    Subjective Mr. Vicencio states that he feels a lot better, he feels 80% improved.    How long can you sit comfortably? Able to sit for quite a while    How long can you stand comfortably? able to stand for 15 minutes      How long can you walk comfortably? no more than    Patient Stated Goals Goal met able to move in bed    Pain Score 1     Pain Location Back    Pain Orientation Lower;Right    Pain Descriptors / Indicators Aching    Pain Onset More than a month ago    Pain Frequency Intermittent    Aggravating Factors  random              [facility] PT Assessment - 01/24/20 0001      Assessment   Medical Diagnosis LBP    Referring Provider (PT) Corena Herter PA    Prior Therapy yes years ago       Observation/Other Assessments   Focus on Therapeutic Outcomes (FOTO)  16.44% function; 60 % funciton       AROM   Lumbar Flexion 20% limited was 50% limited   limited reversal with return upright   Lumbar Extension normal was 50% limited     Lumbar - Right Side Bend normal was 75% limited   painful on side   Lumbar - Left Side Bend normal was 25% limited    Lumbar - Right Rotation normal was 25% limited    Lumbar - Left Rotation normal was25% limited      Bed Mobility   Bed Mobility --   able to roll now without difficulty                     PT Short Term Goals - 01/24/20 0934      PT SHORT TERM GOAL #1   Title Patient will be independent in self management strategies to improve quality of life and functional outcomes.    Time 3    Period Weeks    Status Achieved    Target Date 01/18/20      PT SHORT TERM GOAL #2   Title Patient will report at least 25% improvement in overall symptoms and/or function to demonstrate improved functional mobility    Time 3    Period Weeks    Status Achieved    Target Date 01/18/20      PT SHORT TERM GOAL #3   Title Patient will report improved ease rolling over in bed    Time 3    Period Weeks    Status Achieved    Target Date 01/18/20             PT Long Term Goals - 01/24/20 0934      PT LONG TERM GOAL #1   Period Weeks    Status Achieved  PT LONG TERM GOAL #2   Title Patient will report being able to decrease pain  intermittently throuhgout the day with self traction techniques    Status Achieved      PT LONG TERM GOAL #3   Title Patient will report at least 50% improvement in overall symptoms and/or function to demonstrate improved functional mobility    Period Weeks    Status Achieved                 Plan - 01/24/20 0937    Clinical Impression Statement Pt reassessed, he has made great gains and is I in exercises and body mechanics at this time.  Pt goals have all been met and pt is ready for discharge at this time.    Personal Factors and Comorbidities Comorbidity 1;Comorbidity 2    Comorbidities chronic low back pain    Examination-Activity Limitations Bed Mobility;Bend;Squat;Stairs;Stand;Locomotion Level;Transfers    Examination-Participation Restrictions Meal Prep;Driving;Community Activity;Cleaning;Yard Work;Shop    Stability/Clinical Decision Making Stable/Uncomplicated    Clinical Decision Making Low    Rehab Potential Good    PT Treatment/Interventions ADLs/Self Care Home Management;Aquatic Therapy;Cryotherapy;Electrical Stimulation;Moist Heat;Traction;Balance training;Therapeutic exercise;Therapeutic activities;Functional mobility training;Gait training;Neuromuscular re-education;Iontophoresis 48m/ml Dexamethasone;Patient/family education;Manual techniques;Dry needling;Passive range of motion;Joint Manipulations    PT Next Visit Plan Discharge.    PT Home Exercise Plan self traction in chair; 9/29:  ab and glut set, hamstring, knee to chest stretch, LTR and sitting posture. 10/6 bridge standing hip abduction    Consulted and Agree with Plan of Care Patient           Patient will benefit from skilled therapeutic intervention in order to improve the following deficits and impairments:  Pain, Decreased activity tolerance, Decreased strength, Difficulty walking, Decreased mobility, Decreased range of motion, Postural dysfunction, Improper body mechanics, Hypomobility  Visit  Diagnosis: Difficulty in walking, not elsewhere classified  Chronic right-sided low back pain with right-sided sciatica     Problem List Patient Active Problem List   Diagnosis Date Noted  . Choledocholithiasis 11/06/2017  . Bile leak   . RUQ pain   . Chronic systolic CHF (congestive heart failure) ([place] 09/03/2017  . ACTINIC KERATOSIS 11/01/2009  . Dilated cardiomyopathy ([facility] 06/12/2009  . OSA (obstructive sleep apnea) 05/02/2009  . Morbid obesity ([facility] [place] 01/31/2009  . LBBB (left bundle branch block) 12/25/2008  . HYPERLIPIDEMIA 11/22/2008  . COLONIC POLYPS 10/19/2008  . Essential hypertension 10/19/2008  . GERD 10/19/2008  . DIVERTICULITIS, COLON 10/19/2008    CRayetta Humphrey PT CLT [phone]/19/2021, 9:39 AM  [id number] [address]Phone: [phone]  Fax:  [phone] Name: MMILLAN LEGANMRN: [id number] of Birth: 2Mar 23, 1962

## 2020-01-27 ENCOUNTER — Other Ambulatory Visit (HOSPITAL_COMMUNITY): Payer: Self-pay | Admitting: Cardiology

## 2020-02-15 ENCOUNTER — Ambulatory Visit (HOSPITAL_COMMUNITY)
Admission: RE | Admit: 2020-02-15 | Discharge: 2020-02-15 | Disposition: A | Discharge status: Home / Self Care | Payer: 59 | Source: Ambulatory Visit | Attending: Internal Medicine | Admitting: Internal Medicine

## 2020-02-15 ENCOUNTER — Other Ambulatory Visit: Payer: Self-pay

## 2020-02-15 DIAGNOSIS — I5022 Chronic systolic (congestive) heart failure: Secondary | ICD-10-CM | POA: Diagnosis present

## 2020-02-15 LAB — BASIC METABOLIC PANEL
Anion gap: 10 (ref 5–15)
BUN: 17 mg/dL (ref 6–20)
CO2: 23 mmol/L (ref 22–32)
Calcium: 8.9 mg/dL (ref 8.9–10.3)
Chloride: 103 mmol/L (ref 98–111)
Creatinine, Ser: 1.43 mg/dL — ABNORMAL HIGH (ref 0.61–1.24)
GFR, Estimated: 56 mL/min — ABNORMAL LOW (ref >60)
Glucose, Bld: 115 mg/dL — ABNORMAL HIGH (ref 70–99)
Potassium: 4.1 mmol/L (ref 3.5–5.1)
Sodium: 136 mmol/L (ref 135–145)

## 2020-02-22 ENCOUNTER — Other Ambulatory Visit (HOSPITAL_COMMUNITY): Payer: Self-pay

## 2020-02-22 MED ORDER — ENTRESTO 97-103 MG PO TABS: 1.0000 | ORAL_TABLET | Freq: Two times a day (BID) | ORAL | 3 refills | Status: DC

## 2020-03-05 ENCOUNTER — Other Ambulatory Visit (HOSPITAL_COMMUNITY): Payer: Self-pay | Admitting: Cardiology

## 2020-03-29 ENCOUNTER — Telehealth (HOSPITAL_COMMUNITY): Payer: Self-pay | Admitting: Pharmacy Technician

## 2020-03-29 NOTE — Telephone Encounter (Signed)
It's time to re-enroll patient to receive medication assistance for Entresto from [employer]. Left voicemail for patient to call me back so that we can start the re-enrollment process.  Nicholas Gray, CPhT     

## 2020-04-05 ENCOUNTER — Other Ambulatory Visit (HOSPITAL_COMMUNITY): Payer: Self-pay | Admitting: Cardiology

## 2020-05-01 ENCOUNTER — Other Ambulatory Visit: Payer: Self-pay

## 2020-05-01 ENCOUNTER — Ambulatory Visit (HOSPITAL_COMMUNITY)
Admission: RE | Admit: 2020-05-01 | Discharge: 2020-05-01 | Disposition: A | Discharge status: Home / Self Care | Payer: 59 | Source: Ambulatory Visit | Attending: Cardiology | Admitting: Cardiology

## 2020-05-01 ENCOUNTER — Encounter (HOSPITAL_COMMUNITY): Payer: Self-pay | Admitting: Cardiology

## 2020-05-01 VITALS — BP 110/60 | HR 55 | Wt 277.2 lb

## 2020-05-01 DIAGNOSIS — E669 Obesity, unspecified: Secondary | ICD-10-CM | POA: Insufficient documentation

## 2020-05-01 DIAGNOSIS — I428 Other cardiomyopathies: Secondary | ICD-10-CM | POA: Insufficient documentation

## 2020-05-01 DIAGNOSIS — I447 Left bundle-branch block, unspecified: Secondary | ICD-10-CM | POA: Diagnosis not present

## 2020-05-01 DIAGNOSIS — Z9049 Acquired absence of other specified parts of digestive tract: Secondary | ICD-10-CM | POA: Diagnosis not present

## 2020-05-01 DIAGNOSIS — Z87891 Personal history of nicotine dependence: Secondary | ICD-10-CM | POA: Diagnosis not present

## 2020-05-01 DIAGNOSIS — G4733 Obstructive sleep apnea (adult) (pediatric): Secondary | ICD-10-CM | POA: Insufficient documentation

## 2020-05-01 DIAGNOSIS — K219 Gastro-esophageal reflux disease without esophagitis: Secondary | ICD-10-CM | POA: Diagnosis not present

## 2020-05-01 DIAGNOSIS — I110 Hypertensive heart disease with heart failure: Secondary | ICD-10-CM | POA: Diagnosis not present

## 2020-05-01 DIAGNOSIS — R0602 Shortness of breath: Secondary | ICD-10-CM | POA: Insufficient documentation

## 2020-05-01 DIAGNOSIS — E785 Hyperlipidemia, unspecified: Secondary | ICD-10-CM | POA: Insufficient documentation

## 2020-05-01 DIAGNOSIS — I5022 Chronic systolic (congestive) heart failure: Secondary | ICD-10-CM | POA: Insufficient documentation

## 2020-05-01 DIAGNOSIS — Z7901 Long term (current) use of anticoagulants: Secondary | ICD-10-CM | POA: Insufficient documentation

## 2020-05-01 DIAGNOSIS — Z6839 Body mass index (BMI) 39.0-39.9, adult: Secondary | ICD-10-CM | POA: Diagnosis not present

## 2020-05-01 DIAGNOSIS — Z79899 Other long term (current) drug therapy: Secondary | ICD-10-CM | POA: Diagnosis not present

## 2020-05-01 LAB — BASIC METABOLIC PANEL
Anion gap: 10 (ref 5–15)
BUN: 14 mg/dL (ref 6–20)
CO2: 24 mmol/L (ref 22–32)
Calcium: 8.8 mg/dL — ABNORMAL LOW (ref 8.9–10.3)
Chloride: 102 mmol/L (ref 98–111)
Creatinine, Ser: 1.22 mg/dL (ref 0.61–1.24)
GFR, Estimated: >60 mL/min (ref >60)
Glucose, Bld: 110 mg/dL — ABNORMAL HIGH (ref 70–99)
Potassium: 3.7 mmol/L (ref 3.5–5.1)
Sodium: 136 mmol/L (ref 135–145)

## 2020-05-01 LAB — LIPID PANEL
Cholesterol: 93 mg/dL (ref 0–200)
HDL: 26 mg/dL — ABNORMAL LOW (ref >40)
LDL Cholesterol: 48 mg/dL (ref 0–99)
Total CHOL/HDL Ratio: 3.6 RATIO
Triglycerides: 95 mg/dL (ref <150)
VLDL: 19 mg/dL (ref 0–40)

## 2020-05-01 NOTE — Progress Notes (Signed)
Advanced Heart Failure Clinic Note   PCP: [facility] PCP-Cardiologist: Minus Breeding, MD  HF Cardiology: Dr. [name]  HPI:  Nicholas Gray is a 60 y.o. male with chronic systolic CHF, NICM (Normal coronaries 2010), HTN, LBBB, Obesity, and OSA on CPAP.  He has been known to have a cardiomyopathy for the last 10 years or so.   Seen in [facility] office 08/13/17 and was overall feeling well. No new symptoms.  Echo 08/27/17 LVEF 25-30%, Grade 1 DD, Mild MR, Mild LAE, mild RV dilation, PA peak pressure 34 mm Hg.  Referred by Dr. Percival Spanish to CHF team for further evaluation and treatment of CHF.    Pt admitted 5/30 - 09/13/2017 with Chest/upper abdominal discomfort and pain. CT scan and HIDA consistent with acute cholecystitis. Underwent laproscopic chole on 09/07/17 and found to have gangrenous GB. 09/09/17 repeat HIDA scan showed bile leak. ERCP 09/11/17, but patient had failed stent placement. Considered for IR for drain placement, but decided on conservative management.  He was admitted again with RUQ pain in 7/19, had ERCP with bile leak and choledocholithiasis noted.  He had stone removal and stent placed.   LHC/RHC in 7/19 showed no significant CAD, optimized filling pressures and preserved cardiac output. Cardiac MRI in 11/19 showed improvement in LV EF to 51% with mildly decreased RV systolic function, no LGE.   Echo in 5/21 showed EF in the range of 40% though it is a technically difficult study.  There is septal-lateral dyssynchrony. Cardiac MRI in 6/21 showed LV EF 46% with septal-lateral dyssynchrony, RV EF 51%, no LGE.   He returns for followup of CHF.  Weight is down another 7 lbs.  Using CPAP regularly.  Short of breath walking up hills and stairs, no dyspnea walking on flat ground.  No orthopnea/PND.  Rare lightheadedness if he stands too fast.      ECG (personally reviewed): NSR, LBBB 160 msec  Labs (6/19): K 4.2, creatinine 1.14 Labs (8/19): K 3.9, creatinine 1.14,  normal LFTs Labs (01/05/2018): K 4.5 creatinine 1.23  Labs (11/19): K 3.9, creatinine 1.16 Labs (1/20): K 3.9, creatinine 1.13 Labs (3/21): K 4.2, creatinine 1.28, hgb 13.7, LDL 57, HDL 28 Labs (5/21): K 4.2, creatinine 1.33 Labs (11/21): K 4.1, creatinine 1.43  Social History: Lives in [place], [place]. 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 Denies heavy ETOH use.  Family history: He denies immediate family history of CHF, but thinks his maternal grandparents may have had "heart problems"   Review of systems complete and found to be negative unless listed in HPI.    Past Medical History: 1. Chronic systolic CHF: Suspected nonischemic cardiomyopathy (normal coronaries 2010).  - Echo 2012 with EF 30% - Echo 12/15 with EF 40-45% - Echo 2017 with EF 35-40% - Echo 08/27/17 LVEF 25-30%, Grade 1 DD, Mild MR, Mild LAE, mild RV dilation, PA peak pressure 34 mm Hg.  - LHC/RHC (7/19): No significant coronary disease. RA 7, PA 39/12 mean 25, PCWP mean 11, CI 3.68, PVR 1.65 WU.  - Cardiac MRI (11/19): LV EF 51% with septal-lateral dyssynchrony, mild RV dilation with RV EF 39%, no LGE.  - Echo (5/21): Technically difficult, EF around 40% with diffuse hypokinesis and septal-lateral dyssynchrony, normal RV.  - Cardiac MRI (6/21): LV EF 46% with septal-lateral dyssynchrony, RV EF 51%, no LGE.  2. HTN 3. LBBB, chronic - QRS 166 on EKG 09/04/17 4. Obesity Body mass index is  39.77 kg/m. 5. OSA: Reports compliance with his CPAP.  6. GERD 7. Cholecystitis: Underwent laproscopic chole on 09/07/17 and found to have gangrenous GB.  - 09/09/17 repeat HIDA scan showed bile leak.  - ERCP 09/11/17, but patient had failed stent placement. - ERCP 7/19 with sphincterotomy, stone removal, stent placement.   Current Outpatient Medications  Medication Sig Dispense Refill  . allopurinol (ZYLOPRIM) 100 MG tablet Take 100 mg by mouth 2 (two) times daily.     . carvedilol (COREG) 25 MG  tablet TAKE (1) TABLET BY MOUTH TWICE DAILY WITH A MEAL. 60 tablet 11  . Famotidine (PEPCID PO) Take by mouth daily.    Marland Kitchen FARXIGA 10 MG TABS tablet TAKE 1 TABLET BEFORE BREAKFAST. 30 tablet 0  . furosemide (LASIX) 20 MG tablet Take 1 tablet (20 mg total) by mouth as needed for fluid or edema. 30 tablet 3  . sacubitril-valsartan (ENTRESTO) 97-103 MG Take 1 tablet by mouth 2 (two) times daily. 180 tablet 3  . simvastatin (ZOCOR) 40 MG tablet Take 40 mg by mouth at bedtime.    Marland Kitchen spironolactone (ALDACTONE) 25 MG tablet TAKE 1 TABLET BY MOUTH DAILY. 30 tablet 11   No current facility-administered medications for this encounter.   No Known Allergies  Vitals:   05/01/20 0921  BP: 110/60  Pulse: (!) 55  SpO2: 96%  Weight: 125.7 kg (277 lb 3.2 oz)   Wt Readings from Last 3 Encounters:  05/01/20 125.7 kg (277 lb 3.2 oz)  12/13/19 127 kg (280 lb)  11/15/19 128.9 kg (284 lb 3.2 oz)    PHYSICAL EXAM: General: NAD Neck: No JVD, no thyromegaly or thyroid nodule.  Lungs: Clear to auscultation bilaterally with normal respiratory effort. CV: Nondisplaced PMI.  Heart regular S1/S2, no S3/S4, no murmur.  No peripheral edema.  No carotid bruit.  Normal pedal pulses.  Abdomen: Soft, nontender, no hepatosplenomegaly, no distention.  Skin: Intact without lesions or rashes.  Neurologic: Alert and oriented x 3.  Psych: Normal affect. Extremities: No clubbing or cyanosis.  HEENT: Normal.   ASSESSMENT & PLAN:  1. Chronic systolic CHF: Nonischemic cardiomyopathy by coronary angiography in 2010 and 7/19.  He has had a cardiomyopathy for at least the last 10 years.  Echo in 5/19 showed a fall in EF to the 25-30% range.  He does not have an ICD as prior echoes had shown EF in the 40% range.  It is possible that he has a LBBB cardiomyopathy.  Prior viral myocarditis is also a possibility. No significant FH of CHF and no heavy ETOH/substance abuse.  Cardiac MRI in 6/21 showed LV EF 46% with septal-lateral  dyssynchrony, RV EF 51%, no LGE.  He is not volume overloaded.  NYHA class II symptoms.   - He does not appear to need standing Lasix.  - Continue current Entresto, spironolactone, and Coreg (all at goal).  - Continue dapagliflozin 10 mg daily.   - BMET today and every 3 months.    - EF has been out of CRT-D range.   I will arrange for repeat echo at followup visit.  2. HTN: BP controlled.  3. LBBB: Chronic, unchanged.  4. Obesity: Body mass index is 39.77 kg/m.  - Encouraged weight loss by diet/exercise.  5. OSA: Continue CPAP.  - Due for appt with sleep medicine, will arrange.  6. Hyperlipidemia: Check lipids today.   Followup in 6 months with echo.   [name] 05/01/2020

## 2020-05-01 NOTE — Patient Instructions (Addendum)
Labs done today. We will contact you only if your labs are abnormal.  No medication changes were made. Please continue all current medications as prescribed.  Your physician recommends that you schedule a follow-up appointment in: 3 months for a lab only appointment and in 6 months for an appointment with Dr. [name] with an echo prior to your appointment.  Please contact Cardiology for a Sleep Medicine appointment at [facility] to schedule an appointment with Ellouise Newer (P) [phone]   If you have any questions or concerns before your next appointment please send [place] a message through [place] or call our office at [phone].    TO LEAVE A MESSAGE FOR THE NURSE SELECT OPTION 2, PLEASE LEAVE A MESSAGE INCLUDING: . YOUR NAME . DATE OF BIRTH . CALL BACK NUMBER . REASON FOR CALL**this is important as we prioritize the call backs  YOU WILL RECEIVE A CALL BACK THE SAME DAY AS LONG AS YOU CALL BEFORE 4:00 PM   Do the following things EVERYDAY: 1) Weigh yourself in the morning before breakfast. Write it down and keep it in a log. 2) Take your medicines as prescribed 3) Eat low salt foods-Limit salt (sodium) to 2000 mg per day.  4) Stay as active as you can everyday 5) Limit all fluids for the day to less than 2 liters   At the [facility], you and your health needs are our priority. As part of our continuing mission to provide you with exceptional heart care, we have created designated Provider Care Teams. These Care Teams include your primary Cardiologist (physician) and Advanced Practice Providers (APPs- Physician Assistants and Nurse Practitioners) who all work together to provide you with the care you need, when you need it.   You may see any of the following providers on your designated Care Team at your next follow up: Marland Kitchen Dr Glori Bickers . Dr [name] . Darrick Grinder, NP . Lyda Jester, PA . Audry Riles, PharmD   Please be sure to bring in all your  medications bottles to every appointment.

## 2020-05-04 ENCOUNTER — Other Ambulatory Visit (HOSPITAL_COMMUNITY): Payer: Self-pay | Admitting: Cardiology

## 2020-06-03 ENCOUNTER — Other Ambulatory Visit (HOSPITAL_COMMUNITY): Payer: Self-pay | Admitting: Cardiology

## 2020-06-18 ENCOUNTER — Other Ambulatory Visit: Payer: Self-pay

## 2020-06-18 ENCOUNTER — Ambulatory Visit (INDEPENDENT_AMBULATORY_CARE_PROVIDER_SITE_OTHER): Payer: 59 | Admitting: Cardiovascular Disease

## 2020-06-18 ENCOUNTER — Encounter: Payer: Self-pay | Admitting: Cardiovascular Disease

## 2020-06-18 DIAGNOSIS — Z9989 Dependence on other enabling machines and devices: Secondary | ICD-10-CM | POA: Diagnosis not present

## 2020-06-18 DIAGNOSIS — I428 Other cardiomyopathies: Secondary | ICD-10-CM | POA: Diagnosis not present

## 2020-06-18 DIAGNOSIS — G4733 Obstructive sleep apnea (adult) (pediatric): Secondary | ICD-10-CM

## 2020-06-18 DIAGNOSIS — I5022 Chronic systolic (congestive) heart failure: Secondary | ICD-10-CM

## 2020-06-18 DIAGNOSIS — I447 Left bundle-branch block, unspecified: Secondary | ICD-10-CM | POA: Diagnosis not present

## 2020-06-18 NOTE — Patient Instructions (Signed)
Medication Instructions:  No changes  *If you need a refill on your cardiac medications before your next appointment, please call your pharmacy*   Lab Work:  Not needed  Testing/Procedures:    Follow-Up: At [facility], you and your health needs are our priority.  As part of our continuing mission to provide you with exceptional heart care, we have created designated Provider Care Teams.  These Care Teams include your primary Cardiologist (physician) and Advanced Practice Providers (APPs -  Physician Assistants and Nurse Practitioners) who all work together to provide you with the care you need, when you need it.     Your next appointment:   12 month(s)- Sleep clinic  The format for your next appointment:   In Person  Provider:   Shelva Majestic, MD   Other Instructions   Your pressure setting were changed to 14

## 2020-06-18 NOTE — Progress Notes (Signed)
Cardiology Office Note    Date:  06/18/2020   ID:  Nicholas Gray, DOB May 11, 1960, MRN [id number]  PCP:  [facility]  Cardiologist:  Nicholas Majestic, MD (sleep); Dr. Percival Gray, Dr. Aundra Gray  No chief complaint on file.  F/U Sleep Clinic evaluation.   History of Present Illness:  Nicholas Gray is a 60 y.o. male who is now  followed by Dr. Aundra Gray for his cardiology care.  I last saw him in April 2019.  He presents for a 3-year follow-up sleep evaluation.  Nicholas Gray has a history of a nonischemic cardiomyopathy with an ejection fraction of 30-40% in 2010, and his most recent echo in September 2017 showed an EF of 35-40% with grade 1 diastolic dysfunction and moderate pulmonary hypertension.  He admits to recent increasing shortness of breath with uphill walking and notes mild ankle swelling.  He was initially referred for a sleep study in 2010 at [facility].  He had seen Dr. Dagmar Gray  on one occasion in February 2011.  He initially tried therapy, but abandoned this shortly thereafter, and never had follow-up evaluation.   He was ultimately referred back for a split night sleep study on 05/13/2015 due to symptoms of snoring, witnessed apnea, and nonrestorative sleep.  This demonstrated severe sleep apnea with an AHI of 57.4 per hour.  AHI during REM sleep was 49.4/h and with supine sleep was 78.1/h.  He had significant oxygen desaturation to 73% and time below 88% was 34.6 minutes.  CPAP titration was initiated.  During a split-night study, and a 12 cm water pressure was recommended. [facility] is his DME.  For some reason, after initiating CPAP therapy one year ago, he never presented to the sleep clinic for initial assessment.  However, since initiating CPAP therapy, he is sleeping better.  He is unaware of any breakthrough snoring.  I saw him for initial sleep evaluation and April 2018.  A download was obtained from 06/29/2016 through 07/28/2016.  He was  meeting compliance standards with 100% usage days and usage greater than 4 hours.  He is averaging 7 hours and 43 minutes of CPAP use per night.  AHI at 12 cwp is 4.4.  He has an air-fit F10, size small mask.  Further review of his download indicates that there were at least 3 nights where his AHI was >5/h with a peak up to 8.8.  Of note, he admits to a 30 pound weight gain over the past 6 months.  He is unaware of any breakthrough snoring.  His sleep is more restorative.  He is unaware of any parasomnias, or nocturnal palpitations. He denies daytime sleepiness, hypersomnolence, bruxism, restless legs, hypnogognic hallucinations, no cataplexy.  An Epworth Sleepiness Scale was recalculated in the office today which arteries against residual daytime sleepiness as shown below.  Epworth Sleepiness Scale: Situation   Chance of Dozing/Sleeping (0 = never , 1 = slight chance , 2 = moderate chance , 3 = high chance )   sitting and reading 2   watching TV 1   sitting inactive in a public place 0   being a passenger in a motor vehicle for an hour or more 0   lying down in the afternoon 3   sitting and talking to someone 0   sitting quietly after lunch (no alcohol) 1   while stopped for a few minutes in traffic as the driver 0   Total Score  7   I saw him  in April 2019, Nicholas Gray was compliant with CPAP therapy.  He remained active and was working at least 6 days/week.  He has more energy since initiating CPAP.  He typically works 11 hours/day.  He is up at 4 AM and works between 6 AM and 5 PM.  He typically goes to bed at 8 PM and is asleep by 9 PM.  On Saturday he works from 6 AM until noon.  New download was obtained in the office from July 04, 2017 through August 02, 2017 showed 100% compliance with average usage 7 hours and 43 minutes of sleep per night.  His CPAP machine was set at 13 cm, AHI was 4.1.  He denied any residual daytime sleepiness.   Since I last saw him, he is now seeing Dr. Aundra Gray for  advanced heart failure clinic care and has not seen Dr. Percival Gray.  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  He admits to 1% compliance with CPAP therapy.  He denies residual daytime sleepiness.  An Epworth Sleepiness Scale score was calculated in the office today and this endorsed at 5.  I obtained a download from May 16, 2020 through June 14, 2020 which confirms 100% compliance and average use at 8 hours and 17 minutes.  At 13 cm water pressure AHI is 3.3.  He [facility] had [facility] which transition to [place].  However he has not had gotten any supplies in some time.  He uses a full facemask.  I reviewed his app today.  There are some nights where his AHI is slightly above 5.  Typically goes to bed at 10 PM and wakes up at 6 AM.  He denies any residual snoring, daytime sleepiness, bruxism, hypnagogic hallucinations or cataplectic events.  He presents for evaluation.  Past Medical History:  Diagnosis Date   Arthritis    gout   Cardiomyopathy    EF 30-40% (Normal coronaries cath 2010).  Echo 2015 EF 40%   CHF (congestive heart failure) (HCC)    Colonic polyp    Diverticulosis of colon    GERD (gastroesophageal reflux disease)    History of chicken pox    HLD (hyperlipidemia)    recent   HTN (hypertension)    recent   Sleep apnea    does use CPAP.      Past Surgical History:  Procedure Laterality Date   BILIARY STENT PLACEMENT  11/08/2017   Procedure: BILIARY STENT PLACEMENT;  Surgeon: Nicholas Ada, MD;  Location: [facility] ENDOSCOPY;  Service: Endoscopy;;   BIOPSY  03/08/2018   Procedure: BIOPSY;  Surgeon: Nicholas Stabler, MD;  Location: [facility] ENDOSCOPY;  Service: Gastroenterology;;   CARDIAC CATHETERIZATION  2010   CHOLECYSTECTOMY N/A 09/07/2017   Procedure: LAPAROSCOPIC CHOLECYSTECTOMY;  Surgeon: Nicholas Skinner, MD;  Location: [place];  Service: General;  Laterality: N/A;  LAPAROSCOPIC  CHOLECYSTECTOMY   COLONOSCOPY WITH PROPOFOL N/A 03/08/2018   Procedure: COLONOSCOPY WITH PROPOFOL;  Surgeon: Nicholas Stabler, MD;  Location: [facility] ENDOSCOPY;  Service: Gastroenterology;  Laterality: N/A;   ERCP N/A 09/11/2017   Procedure: ENDOSCOPIC RETROGRADE CHOLANGIOPANCREATOGRAPHY (ERCP);  Surgeon: Nicholas Stabler, MD;  Location: [place];  Service: Gastroenterology;  Laterality: N/A;   ERCP N/A 11/08/2017   Procedure: ENDOSCOPIC RETROGRADE CHOLANGIOPANCREATOGRAPHY (ERCP);  Surgeon: Nicholas Ada, MD;  Location: [place];  Service: Endoscopy;  Laterality: N/A;   ERCP N/A 01/11/2018   Procedure: ENDOSCOPIC RETROGRADE CHOLANGIOPANCREATOGRAPHY;  Surgeon: Nicholas Stabler, MD;  Location: [facility] ENDOSCOPY;  Service: Gastroenterology;  Laterality: N/A;  With Balloon Sweep of Ducts   REMOVAL OF STONES  11/08/2017   Procedure: REMOVAL OF STONES;  Surgeon: Nicholas Ada, MD;  Location: [place];  Service: Endoscopy;;   RIGHT/LEFT HEART CATH AND CORONARY ANGIOGRAPHY N/A 10/12/2017   Procedure: RIGHT/LEFT HEART CATH AND CORONARY ANGIOGRAPHY;  Surgeon: Larey Dresser, MD;  Location: [place] CV LAB;  Service: Cardiovascular;  Laterality: N/A;   SPHINCTEROTOMY  11/08/2017   Procedure: SPHINCTEROTOMY;  Surgeon: Nicholas Ada, MD;  Location: [place];  Service: Endoscopy;;   STENT REMOVAL  01/11/2018   Procedure: STENT REMOVAL;  Surgeon: Nicholas Stabler, MD;  Location: [facility] ENDOSCOPY;  Service: Gastroenterology;;   TONSILLECTOMY AND ADENOIDECTOMY      Current Medications: Outpatient Medications Prior to Visit  Medication Sig Dispense Refill   allopurinol (ZYLOPRIM) 100 MG tablet Take 100 mg by mouth 2 (two) times daily.      carvedilol (COREG) 25 MG tablet TAKE (1) TABLET BY MOUTH TWICE DAILY WITH A MEAL. 60 tablet 11   Famotidine (PEPCID PO) Take by mouth daily.     FARXIGA 10 MG TABS tablet TAKE 1 TABLET BEFORE BREAKFAST. 30 tablet 6   furosemide (LASIX) 20 MG tablet Take 1  tablet (20 mg total) by mouth as needed for fluid or edema. 30 tablet 3   sacubitril-valsartan (ENTRESTO) 97-103 MG Take 1 tablet by mouth 2 (two) times daily. 180 tablet 3   simvastatin (ZOCOR) 40 MG tablet Take 40 mg by mouth at bedtime.     spironolactone (ALDACTONE) 25 MG tablet TAKE 1 TABLET BY MOUTH DAILY. 30 tablet 11   No facility-administered medications prior to visit.     Allergies:   Patient has no known allergies.   Social History   Socioeconomic History   Marital status: Married    Spouse name: Not on file   Number of children: 1   Years of education: Not on file   Highest education level: Not on file  Occupational History   Not on file  Tobacco Use   Smoking status: Former Smoker    Packs/day: 1.50    Years: 3.00    Pack years: 4.50    Quit date: 04/07/1980    Years since quitting: 40.2   Smokeless tobacco: Never Used  Vaping Use   Vaping Use: Never used  Substance and Sexual Activity   Alcohol use: No   Drug use: No    Comment: quit in 1999, marijuana   Sexual activity: Not on file  Other Topics Concern   Not on file  Social History Narrative   Not on file   Social Determinants of Health   Financial Resource Strain: Not on file  Food Insecurity: Not on file  Transportation Needs: Not on file  Physical Activity: Not on file  Stress: Not on file  Social Connections: Not on file     Family History:  The patient's family history is notable for diabetes, hyperlipidemia, lung cancer, stroke, and arthritis.  ROS General: Negative; No fevers, chills, or night sweats;  HEENT: Negative; No changes in vision or hearing, sinus congestion, difficulty swallowing Pulmonary: Negative; No cough, wheezing, shortness of breath, hemoptysis Cardiovascular: History of nonischemic heart cardiomyopathy, hypertension; exertional dyspnea, occasional swelling, hyperlipidemia GI: Positive for GERD GU: Negative; No dysuria, hematuria, or difficulty  voiding Musculoskeletal: Negative; no myalgias, joint pain, or weakness Hematologic/Oncology: Negative; no easy bruising, bleeding Endocrine:  Negative; no heat/cold intolerance; no diabetes Neuro: Negative; no changes in balance, headaches Skin: Negative; No rashes or skin lesions Psychiatric: Negative; No behavioral problems, depression Sleep:  See HPI Other comprehensive 14 point system review is negative.   PHYSICAL EXAM:   VS:  BP 108/68 (BP Location: Left Arm, Patient Position: Sitting)    Pulse (!) 52    Ht 5' 10"  (1.778 m)    Wt 277 lb 3.2 oz (125.7 kg)    SpO2 96%    BMI 39.77 kg/m     Repeat blood pressure by me was 100/64  Wt Readings from Last 3 Encounters:  06/18/20 277 lb 3.2 oz (125.7 kg)  05/01/20 277 lb 3.2 oz (125.7 kg)  12/13/19 280 lb (127 kg)    General: Alert, oriented, no distress.  Skin: normal turgor, no rashes, warm and dry;  HEENT: Normocephalic, atraumatic. Pupils equal round and reactive to light; sclera anicteric; extraocular muscles intact; Nose without nasal septal hypertrophy Mouth/Parynx benign; Mallinpatti scale 3/4 Neck: No JVD, no carotid bruits; normal carotid upstroke Lungs: clear to ausculatation and percussion; no wheezing or rales Chest wall: without tenderness to palpitation Heart: PMI not displaced, RRR, s1 s2 normal, 1/6 systolic murmur, no diastolic murmur, no rubs, gallops, thrills, or heaves Abdomen: moderate central adiposity soft, nontender; no hepatosplenomehaly, BS+; abdominal aorta nontender and not dilated by palpation. Back: no CVA tenderness Pulses 2+ Musculoskeletal: full range of motion, normal strength, no joint deformities Extremities: no clubbing cyanosis or edema, Homan's sign negative  Neurologic: grossly nonfocal; Cranial nerves grossly wnl Psychologic: Normal mood and affect   Studies/Labs Reviewed:   EKG:  EKG is ordered today.  ECG (independently read by me): Sinus bradycardia at 52; LBBB  08/04/2017 ECG  (independently read by me): Sinus bradycardia 55 bpm.  Left bundle branch block with repolarization changes.  No ectopy.  ECG (independently read by me): Sinus bradycardia 57 bpm.  First-degree AV block with PR interval at 200 ms.  Left bundle branch block.  QTc interval 463 ms.  Recent Labs: BMP Latest Ref Rng & Units 05/01/2020 02/15/2020 11/15/2019  Glucose 70 - 99 mg/dL 110(H) 115(H) 110(H)  BUN 6 - 20 mg/dL 14 17 22(H)  Creatinine 0.61 - 1.24 mg/dL 1.22 1.43(H) 1.26(H)  Sodium 135 - 145 mmol/L 136 136 133(L)  Potassium 3.5 - 5.1 mmol/L 3.7 4.1 4.2  Chloride 98 - 111 mmol/L 102 103 101  CO2 22 - 32 mmol/L 24 23 21(L)  Calcium 8.9 - 10.3 mg/dL 8.8(L) 8.9 9.0     Hepatic Function Latest Ref Rng & Units 06/07/2019 11/23/2017 11/09/2017  Total Protein 6.5 - 8.1 g/dL 7.2 7.2 6.5  Albumin 3.5 - 5.0 g/dL 4.1 4.3 3.1(L)  AST 15 - 41 U/L 33 20 60(H)  ALT 0 - 44 U/L 42 32 187(H)  Alk Phosphatase 38 - 126 U/L 45 76 110  Total Bilirubin 0.3 - 1.2 mg/dL 0.6 0.6 1.9(H)  Bilirubin, Direct 0.0 - 0.2 mg/dL - - -    CBC Latest Ref Rng & Units 06/07/2019 11/23/2017 11/09/2017  WBC 4.0 - 10.5 K/uL 6.1 6.7 7.5  Hemoglobin 13.0 - 17.0 g/dL 12.7(L) 11.7(L) 11.1(L)  Hematocrit 39.0 - 52.0 % 39.8 35.4(L) 33.7(L)  Platelets 150 - 400 K/uL 202 279.0 191   Lab Results  Component Value Date   MCV 101.8 (H) 06/07/2019   MCV 92.6 11/23/2017   MCV 92.8 11/09/2017   Lab Results  Component Value Date   TSH 1.47 10/29/2009  Lab Results  Component Value Date   HGBA1C 5.8 (H) 06/07/2019     BNP    Component Value Date/Time   BNP 7.1 09/03/2017 1115    ProBNP    Component Value Date/Time   PROBNP <30.0 03/12/2009 1249     Lipid Panel     Component Value Date/Time   CHOL 93 05/01/2020 0937   TRIG 95 05/01/2020 0937   HDL 26 (L) 05/01/2020 0937   CHOLHDL 3.6 05/01/2020 0937   VLDL 19 05/01/2020 0937   LDLCALC 48 05/01/2020 0937   LDLDIRECT 161.2 10/19/2008 0946     RADIOLOGY: No results  found.   Additional studies/ records that were reviewed today include:  I review the office records of Dr. Charlesetta Shanks, the patient's sleep study with CPAP titration, and have obtain a download from March 25 through 07/28/2016.  I reviewed the most recent records of Dr. Aundra Gray.   New download from May 16, 2020 through June 14, 2020 was obtained.  ASSESSMENT:    1. OSA on CPAP   2. NICM (nonischemic cardiomyopathy) ([place])   3. LBBB (left bundle branch block)   4. Chronic systolic CHF (congestive heart failure) ([place])   5. Morbid obesity [facility])     PLAN:  Nicholas Gray is a 60 year old male who has a history of a nonischemic cardiomyopathy with an ejection fraction initially at 30-35% which had decreased to 25 to 30% in May 2019.  He is now followed by Dr. Aundra Gray is on advanced heart failure medications including carvedilol 25 mg twice a day, Entresto 97/103 mg twice a day, Farxiga 10 mg, furosemide 20 mg as needed and spironolactone 25 mg daily.  He has been on CPAP therapy since 2017 and its to 100% compliance.  His sleep is significantly improved.  He denies any issues with sleep initiation but at times particularly if he is thinking about a lot of things does have some issues with sleep maintenance.  His Epworth Sleepiness Scale score argues against residual daytime sleepiness.  I reviewed his download.  He is averaging 8 hours and 17 minutes of CPAP use per night with 100% compliance.  His AHI is 3.3/h.  He has an air sense 10 CPAP unit which is not an auto mode.  He is set at 13 cm.  I reviewed his app which did show some days where he is getting above 5.  I have recommended a slight additional titration of a set pressure to 14 cm.  He uses a full facemask denies any awareness of significant leak.  Due to his facial hair at times he does have some leak.  Pressure today is well controlled.  BMI is bordering morbid obesity at 39.8.  We discussed the importance of weight loss and exercise.   He continues to be on simvastatin for hyperlipidemia currently takes 40 mg daily.  He continues to be on allopurinol and denies any recent gout flares.  We discussed the importance of weight loss and exercise.  He will continue to follow with Dr. Aundra Gray for his advanced heart failure care.  We discussed that he can qualify for a new machine after 5 years of use particularly if there is some his machine malfunction.  I will see him in 1 year for reevaluation or sooner as needed.  Medication Adjustments/Labs and Tests Ordered: Current medicines are reviewed at length with the patient today.  Concerns regarding medicines are outlined above.  Medication changes, Labs and Tests ordered today are listed  in the Patient Instructions below. Patient Instructions  Medication Instructions:  No changes  *If you need a refill on your cardiac medications before your next appointment, please call your pharmacy*   Lab Work:  Not needed  Testing/Procedures:    Follow-Up: At [facility], you and your health needs are our priority.  As part of our continuing mission to provide you with exceptional heart care, we have created designated Provider Care Teams.  These Care Teams include your primary Cardiologist (physician) and Advanced Practice Providers (APPs -  Physician Assistants and Nurse Practitioners) who all work together to provide you with the care you need, when you need it.     Your next appointment:   12 month(s)- Sleep clinic  The format for your next appointment:   In Person  Provider:   Shelva Majestic, MD   Other Instructions   Your pressure setting were changed to 14    Time spent 25 minutes Signed, Nicholas Majestic, MD  06/18/2020 [place] [address] [place], [place], [place]  [address] Phone: [phone]

## 2020-07-06 ENCOUNTER — Ambulatory Visit: Payer: 59 | Admitting: Cardiovascular Disease

## 2020-07-30 ENCOUNTER — Other Ambulatory Visit: Payer: Self-pay

## 2020-07-30 ENCOUNTER — Ambulatory Visit (HOSPITAL_COMMUNITY)
Admission: RE | Admit: 2020-07-30 | Discharge: 2020-07-30 | Disposition: A | Discharge status: Home / Self Care | Payer: 59 | Source: Ambulatory Visit | Attending: Cardiology | Admitting: Cardiology

## 2020-07-30 DIAGNOSIS — I5022 Chronic systolic (congestive) heart failure: Secondary | ICD-10-CM | POA: Insufficient documentation

## 2020-07-30 LAB — BASIC METABOLIC PANEL
Anion gap: 10 (ref 5–15)
BUN: 13 mg/dL (ref 8–23)
CO2: 26 mmol/L (ref 22–32)
Calcium: 8.7 mg/dL — ABNORMAL LOW (ref 8.9–10.3)
Chloride: 100 mmol/L (ref 98–111)
Creatinine, Ser: 1.16 mg/dL (ref 0.61–1.24)
GFR, Estimated: >60 mL/min (ref >60)
Glucose, Bld: 99 mg/dL (ref 70–99)
Potassium: 3.8 mmol/L (ref 3.5–5.1)
Sodium: 136 mmol/L (ref 135–145)

## 2020-08-07 ENCOUNTER — Other Ambulatory Visit (HOSPITAL_COMMUNITY): Payer: Self-pay | Admitting: *Deleted

## 2020-08-08 ENCOUNTER — Other Ambulatory Visit (HOSPITAL_COMMUNITY): Payer: Self-pay

## 2020-08-08 ENCOUNTER — Telehealth (HOSPITAL_COMMUNITY): Payer: Self-pay | Admitting: Pharmacy Technician

## 2020-08-08 ENCOUNTER — Other Ambulatory Visit (HOSPITAL_COMMUNITY): Payer: Self-pay | Admitting: *Deleted

## 2020-08-08 MED ORDER — ENTRESTO 97-103 MG PO TABS: 1.0000 | ORAL_TABLET | Freq: Two times a day (BID) | ORAL | 3 refills | Status: DC

## 2020-08-08 NOTE — Telephone Encounter (Signed)
The patient called in wanting to renew his [employer] patient assistance. He now has Pharmacist, community. A benefits investigation showed a $187 90 day co-pay. I was able to activate a co-pay card on his behalf, bringing the 90 day co-pay to $10.  BIN [id number] PCN [facility]CP ID [id number] Group [id number]  Sent Jasmine (Matthews) a request to send a 90 day RX to [place], per the patient's request.   Charlann Boxer, CPhT

## 2020-11-20 ENCOUNTER — Telehealth: Payer: Self-pay | Admitting: Pharmacist

## 2020-11-20 ENCOUNTER — Other Ambulatory Visit (HOSPITAL_COMMUNITY): Payer: Self-pay

## 2020-11-20 ENCOUNTER — Ambulatory Visit (HOSPITAL_BASED_OUTPATIENT_CLINIC_OR_DEPARTMENT_OTHER)
Admission: RE | Admit: 2020-11-20 | Discharge: 2020-11-20 | Disposition: A | Discharge status: Home / Self Care | Payer: 59 | Source: Ambulatory Visit | Attending: Cardiology | Admitting: Cardiology

## 2020-11-20 ENCOUNTER — Other Ambulatory Visit: Payer: Self-pay

## 2020-11-20 ENCOUNTER — Ambulatory Visit (HOSPITAL_COMMUNITY)
Admission: RE | Admit: 2020-11-20 | Discharge: 2020-11-20 | Disposition: A | Discharge status: Home / Self Care | Payer: 59 | Source: Ambulatory Visit | Attending: Internal Medicine | Admitting: Internal Medicine

## 2020-11-20 VITALS — BP 110/62 | HR 48 | Wt 274.0 lb

## 2020-11-20 DIAGNOSIS — Z7984 Long term (current) use of oral hypoglycemic drugs: Secondary | ICD-10-CM | POA: Diagnosis not present

## 2020-11-20 DIAGNOSIS — I5022 Chronic systolic (congestive) heart failure: Secondary | ICD-10-CM

## 2020-11-20 DIAGNOSIS — Z09 Encounter for follow-up examination after completed treatment for conditions other than malignant neoplasm: Secondary | ICD-10-CM | POA: Diagnosis not present

## 2020-11-20 DIAGNOSIS — Z6839 Body mass index (BMI) 39.0-39.9, adult: Secondary | ICD-10-CM | POA: Diagnosis not present

## 2020-11-20 DIAGNOSIS — I428 Other cardiomyopathies: Secondary | ICD-10-CM | POA: Insufficient documentation

## 2020-11-20 DIAGNOSIS — M791 Myalgia, unspecified site: Secondary | ICD-10-CM | POA: Insufficient documentation

## 2020-11-20 DIAGNOSIS — G4733 Obstructive sleep apnea (adult) (pediatric): Secondary | ICD-10-CM | POA: Insufficient documentation

## 2020-11-20 DIAGNOSIS — Z79899 Other long term (current) drug therapy: Secondary | ICD-10-CM | POA: Insufficient documentation

## 2020-11-20 DIAGNOSIS — I110 Hypertensive heart disease with heart failure: Secondary | ICD-10-CM | POA: Insufficient documentation

## 2020-11-20 DIAGNOSIS — Z560 Unemployment, unspecified: Secondary | ICD-10-CM | POA: Insufficient documentation

## 2020-11-20 DIAGNOSIS — I447 Left bundle-branch block, unspecified: Secondary | ICD-10-CM | POA: Insufficient documentation

## 2020-11-20 DIAGNOSIS — Z87891 Personal history of nicotine dependence: Secondary | ICD-10-CM | POA: Insufficient documentation

## 2020-11-20 DIAGNOSIS — R42 Dizziness and giddiness: Secondary | ICD-10-CM | POA: Insufficient documentation

## 2020-11-20 DIAGNOSIS — Z7901 Long term (current) use of anticoagulants: Secondary | ICD-10-CM | POA: Insufficient documentation

## 2020-11-20 LAB — ECHOCARDIOGRAM COMPLETE
AR max vel: 2.16 cm2
AV Area VTI: 2.36 cm2
AV Area mean vel: 2.03 cm2
AV Mean grad: 5 mmHg
AV Peak grad: 9.4 mmHg
Ao pk vel: 1.53 m/s
Area-P 1/2: 2.91 cm2
Calc EF: 44.9 %
S' Lateral: 4.1 cm
Single Plane A2C EF: 37.8 %
Single Plane A4C EF: 51.2 %

## 2020-11-20 LAB — BRAIN NATRIURETIC PEPTIDE: B Natriuretic Peptide: 18.7 pg/mL (ref 0.0–100.0)

## 2020-11-20 LAB — BASIC METABOLIC PANEL
Anion gap: 10 (ref 5–15)
BUN: 21 mg/dL (ref 8–23)
CO2: 21 mmol/L — ABNORMAL LOW (ref 22–32)
Calcium: 9.3 mg/dL (ref 8.9–10.3)
Chloride: 102 mmol/L (ref 98–111)
Creatinine, Ser: 1.26 mg/dL — ABNORMAL HIGH (ref 0.61–1.24)
GFR, Estimated: >60 mL/min (ref >60)
Glucose, Bld: 107 mg/dL — ABNORMAL HIGH (ref 70–99)
Potassium: 4.1 mmol/L (ref 3.5–5.1)
Sodium: 133 mmol/L — ABNORMAL LOW (ref 135–145)

## 2020-11-20 MED ORDER — OZEMPIC (0.25 OR 0.5 MG/DOSE) 2 MG/1.5ML ~~LOC~~ SOPN: PEN_INJECTOR | SUBCUTANEOUS | 1 refills | Status: DC

## 2020-11-20 MED ORDER — SEMAGLUTIDE 3 MG PO TABS: 3.0000 mg | ORAL_TABLET | Freq: Every day | ORAL | 11 refills | Status: DC

## 2020-11-20 MED ORDER — ROSUVASTATIN CALCIUM 10 MG PO TABS: 10.0000 mg | ORAL_TABLET | Freq: Every day | ORAL | 11 refills | Status: DC

## 2020-11-20 NOTE — Addendum Note (Signed)
Encounter addended by: Shonna Chock, CMA on: [id number] 1:46 PM  Actions taken: Visit diagnoses modified, Order list changed, Diagnosis association updated

## 2020-11-20 NOTE — Telephone Encounter (Signed)
Received [id number] referral from Dr [name] to initiate semaglutide for weight loss. Pt has [employer] insurance which does not cover Wegovy (copay > $1000), they do cover Ozempic though which is available on his formulary without a prior authorization.  Reviewed mechanism of action, injection technique (pt's wife has DM and he's watched her give similar injections), safety and efficacy data. Pt has no personal or family hx of thyroid cancer.   He is agreeable to start Ozempic which will help with weight loss, provide CV benefit, and also help to lower his A1c from the prediabetic range (5.8% on 06/07/19) back into the normal range. Rx has been sent to his pharmacy. Copay $150 - sent in copay card to reduce cost to $25. ID [id number], BIN [id number], GRP [id number], PCN [id number]. I will send him detailed dosing instructions via MyChart message per his preference. Will call him in 1 month to follow up with continued dose titration.

## 2020-11-20 NOTE — Progress Notes (Signed)
  Echocardiogram 2D Echocardiogram has been performed.  Merrie Roof F 11/20/2020, 8:43 AM

## 2020-11-20 NOTE — Patient Instructions (Addendum)
EKG done today.  Labs done today. We will contact you only if your labs are abnormal.  STOP taking Zocor  START taking Crestor '10mg'$  (1 tablet) by mouth daily.   START Semaglutide '3mg'$  (1 tablet) by mouth daily for weight loss.   No other medication changes were made. Please continue all current medications as prescribed.  Your physician recommends that you schedule a follow-up appointment in: 3 months for a lab only appointment and in 6 months with Dr. [name]. Please contact our office in January for a February appointment.   If you have any questions or concerns before your next appointment please send [place] a message through [place] or call our office at [phone].    TO LEAVE A MESSAGE FOR THE NURSE SELECT OPTION 2, PLEASE LEAVE A MESSAGE INCLUDING: YOUR NAME DATE OF BIRTH CALL BACK NUMBER REASON FOR CALL**this is important as we prioritize the call backs  YOU WILL RECEIVE A CALL BACK THE SAME DAY AS LONG AS YOU CALL BEFORE 4:00 PM   Do the following things EVERYDAY: Weigh yourself in the morning before breakfast. Write it down and keep it in a log. Take your medicines as prescribed Eat low salt foods--Limit salt (sodium) to 2000 mg per day.  Stay as active as you can everyday Limit all fluids for the day to less than 2 liters   At the [facility], you and your health needs are our priority. As part of our continuing mission to provide you with exceptional heart care, we have created designated Provider Care Teams. These Care Teams include your primary Cardiologist (physician) and Advanced Practice Providers (APPs- Physician Assistants and Nurse Practitioners) who all work together to provide you with the care you need, when you need it.   You may see any of the following providers on your designated Care Team at your next follow up: Dr Glori Bickers Dr Haynes Kerns, NP Lyda Jester, [place] Audry Riles, PharmD   Please be sure to bring in  all your medications bottles to every appointment.

## 2020-11-20 NOTE — Progress Notes (Signed)
Advanced Heart Failure Clinic Note   PCP: [facility] PCP-Cardiologist: Minus Breeding, MD  HF Cardiology: Dr. [name]  HPI:  Nicholas Gray is a 60 y.o. male with chronic systolic CHF, NICM (Normal coronaries 2010), HTN, LBBB, Obesity, and OSA on CPAP.  He has been known to have a cardiomyopathy for the last 10 years or so.   Seen in [facility] office 08/13/17 and was overall feeling well. No new symptoms.  Echo 08/27/17 LVEF 25-30%, Grade 1 DD, Mild MR, Mild LAE, mild RV dilation, PA peak pressure 34 mm Hg.  Referred by Dr. Percival Spanish to CHF team for further evaluation and treatment of CHF.    Pt admitted 5/30 - 09/13/2017 with Chest/upper abdominal discomfort and pain. CT scan and HIDA consistent with acute cholecystitis. Underwent laproscopic chole on 09/07/17 and found to have gangrenous GB. 09/09/17 repeat HIDA scan showed bile leak. ERCP 09/11/17, but patient had failed stent placement. Considered for IR for drain placement, but decided on conservative management.  He was admitted again with RUQ pain in 7/19, had ERCP with bile leak and choledocholithiasis noted.  He had stone removal and stent placed.   LHC/RHC in 7/19 showed no significant CAD, optimized filling pressures and preserved cardiac output. Cardiac MRI in 11/19 showed improvement in LV EF to 51% with mildly decreased RV systolic function, no LGE.   Echo in 5/21 showed EF in the range of 40% though it is a technically difficult study.  There is septal-lateral dyssynchrony. Cardiac MRI in 6/21 showed LV EF 46% with septal-lateral dyssynchrony, RV EF 51%, no LGE.  Echo was done today and reviewed, EF 45% with mild diffuse hypokinesis and septal-lateral dyssynchrony, normal RV.   He returns for followup of CHF.  Weight down 3 lbs.  He has had mild myalgias and wonders if they are due to his statin. Occasional lightheadedness with standing, no loss of consciousness. No chest pain. No significant exertional dyspnea.  He has  been driving a wheelchair Printmaker for work.   ECG (personally reviewed): Sinus brady at 47, LBBB 170 msec, 1st degree AVB 210 msec  Labs (6/19): K 4.2, creatinine 1.14 Labs (8/19): K 3.9, creatinine 1.14, normal LFTs Labs (01/05/2018): K 4.5 creatinine 1.23  Labs (11/19): K 3.9, creatinine 1.16 Labs (1/20): K 3.9, creatinine 1.13 Labs (3/21): K 4.2, creatinine 1.28, hgb 13.7, LDL 57, HDL 28 Labs (5/21): K 4.2, creatinine 1.33 Labs (11/21): K 4.1, creatinine 1.43 Labs (1/22): LDL 48 Labs (4/22): K 3.8, creatinine 1.16  Social History: Lives in [place], [place]. 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 Denies heavy ETOH use.  Family history: He denies immediate family history of CHF, but thinks his maternal grandparents may have had "heart problems"   Review of systems complete and found to be negative unless listed in HPI.    Past Medical History: 1. Chronic systolic CHF: Suspected nonischemic cardiomyopathy (normal coronaries 2010).  - Echo 2012 with EF 30% - Echo 12/15 with EF 40-45% - Echo 2017 with EF 35-40% - Echo 08/27/17 LVEF 25-30%, Grade 1 DD, Mild MR, Mild LAE, mild RV dilation, PA peak pressure 34 mm Hg.  - LHC/RHC (7/19): No significant coronary disease. RA 7, PA 39/12 mean 25, PCWP mean 11, CI 3.68, PVR 1.65 WU.  - Cardiac MRI (11/19): LV EF 51% with septal-lateral dyssynchrony, mild RV dilation with RV EF 39%, no LGE.  - Echo (5/21): Technically difficult, EF around 40% with  diffuse hypokinesis and septal-lateral dyssynchrony, normal RV.  - Cardiac MRI (6/21): LV EF 46% with septal-lateral dyssynchrony, RV EF 51%, no LGE.  - Echo (8/22): EF 45% with mild diffuse hypokinesis and septal-lateral dyssynchrony, normal RV.  2. HTN 3. LBBB, chronic - QRS 166 on EKG 09/04/17 4. Obesity Body mass index is 39.31 kg/m. 5. OSA: Reports compliance with his CPAP.  6. GERD 7. Cholecystitis: Underwent laproscopic chole on 09/07/17 and found to have  gangrenous GB.  - 09/09/17 repeat HIDA scan showed bile leak.  - ERCP 09/11/17, but patient had failed stent placement. - ERCP 7/19 with sphincterotomy, stone removal, stent placement.   Current Outpatient Medications  Medication Sig Dispense Refill   allopurinol (ZYLOPRIM) 100 MG tablet Take 100 mg by mouth 2 (two) times daily.      carvedilol (COREG) 25 MG tablet TAKE (1) TABLET BY MOUTH TWICE DAILY WITH A MEAL. 60 tablet 11   Famotidine (PEPCID PO) Take by mouth daily.     FARXIGA 10 MG TABS tablet TAKE 1 TABLET BEFORE BREAKFAST. 30 tablet 6   furosemide (LASIX) 20 MG tablet Take 1 tablet (20 mg total) by mouth as needed for fluid or edema. 30 tablet 3   rosuvastatin (CRESTOR) 10 MG tablet Take 1 tablet (10 mg total) by mouth daily. 30 tablet 11   sacubitril-valsartan (ENTRESTO) 97-103 MG Take 1 tablet by mouth 2 (two) times daily. 180 tablet 3   Semaglutide 3 MG TABS Take 3 mg by mouth daily at 6 (six) AM. 30 tablet 11   spironolactone (ALDACTONE) 25 MG tablet TAKE 1 TABLET BY MOUTH DAILY. 30 tablet 11   No current facility-administered medications for this encounter.   No Known Allergies  Vitals:   11/20/20 0848  BP: 110/62  Pulse: (!) 48  SpO2: 98%  Weight: 124.3 kg (274 lb)   Wt Readings from Last 3 Encounters:  11/20/20 124.3 kg (274 lb)  06/18/20 125.7 kg (277 lb 3.2 oz)  05/01/20 125.7 kg (277 lb 3.2 oz)    PHYSICAL EXAM: General: NAD Neck: No JVD, no thyromegaly or thyroid nodule.  Lungs: Clear to auscultation bilaterally with normal respiratory effort. CV: Nondisplaced PMI.  Heart regular S1/S2, no S3/S4, no murmur.  No peripheral edema.  No carotid bruit.  Normal pedal pulses.  Abdomen: Soft, nontender, no hepatosplenomegaly, no distention.  Skin: Intact without lesions or rashes.  Neurologic: Alert and oriented x 3.  Psych: Normal affect. Extremities: No clubbing or cyanosis.  HEENT: Normal.   ASSESSMENT & PLAN:  1. Chronic systolic CHF: Nonischemic  cardiomyopathy by coronary angiography in 2010 and 7/19.  He has had a cardiomyopathy for at least the last 10 years.  Echo in 5/19 showed a fall in EF to the 25-30% range.  He does not have an ICD as prior echoes had shown EF in the 40% range.  It is possible that he has a LBBB cardiomyopathy.  Prior viral myocarditis is also a possibility. No significant FH of CHF and no heavy ETOH/substance abuse.  Cardiac MRI in 6/21 showed LV EF 46% with septal-lateral dyssynchrony, RV EF 51%, no LGE.  Echo today showed EF 45% with septal-lateral dyssynchrony.  He is not volume overloaded.  NYHA class II symptoms.   - He does not appear to need standing Lasix.  - Continue current Entresto, spironolactone, and Coreg (all at goal).  - Continue dapagliflozin 10 mg daily.   - BMET today and every 3 months.    - EF  has been out of CRT-D range.   2. HTN: BP controlled.  3. LBBB: Chronic, unchanged.  4. Obesity: Body mass index is 39.31 kg/m.  - Encouraged weight loss by diet/exercise.  - I will see if we can get semaglutide for him.  5. OSA: Continue CPAP.  6. Hyperlipidemia: ?Myalgias related to simvastatin.  - Stop simvastatin, start Crestor 10 mg daily.  Lipids/LFTs 2 months.  - Take coenzyme Q10 200 mg daily over the counter.   Followup in 6 months   [name] 11/20/2020

## 2020-12-01 ENCOUNTER — Other Ambulatory Visit (HOSPITAL_COMMUNITY): Payer: Self-pay | Admitting: Cardiology

## 2020-12-14 ENCOUNTER — Other Ambulatory Visit (HOSPITAL_COMMUNITY): Payer: Self-pay | Admitting: Cardiology

## 2020-12-14 DIAGNOSIS — I5022 Chronic systolic (congestive) heart failure: Secondary | ICD-10-CM

## 2020-12-14 NOTE — Progress Notes (Signed)
Orders for labs placed 

## 2020-12-18 ENCOUNTER — Telehealth: Payer: Self-pay | Admitting: Pharmacist

## 2020-12-18 NOTE — Telephone Encounter (Signed)
Called pt and left message to discuss Ozempic tolerability. He should have completed 1 month of 0.'25mg'$  dose, will plan to increase to 0.'5mg'$  if tolerating well.

## 2020-12-18 NOTE — Telephone Encounter (Signed)
Patient returned Megans call. States that the Ozempic was "Trying to kill me" His last dose (4th dose) should have been last Thursday but he did not take. He is still having issues. He had constant loose stools Bloated, diarrhea, couldn't eat   Patient will focus on portion control and healthy eating habits

## 2020-12-18 NOTE — Telephone Encounter (Signed)
Noted. I have removed Ozempic from his med list and added it to his allergy list.

## 2020-12-20 ENCOUNTER — Other Ambulatory Visit (HOSPITAL_COMMUNITY): Payer: Self-pay | Admitting: Cardiology

## 2020-12-24 ENCOUNTER — Other Ambulatory Visit (HOSPITAL_COMMUNITY): Payer: Self-pay | Admitting: Family Medicine

## 2020-12-24 ENCOUNTER — Other Ambulatory Visit: Payer: Self-pay

## 2020-12-24 ENCOUNTER — Ambulatory Visit (HOSPITAL_COMMUNITY)
Admission: RE | Admit: 2020-12-24 | Discharge: 2020-12-24 | Disposition: A | Discharge status: Home / Self Care | Payer: 59 | Source: Ambulatory Visit | Attending: Family Medicine | Admitting: Family Medicine

## 2020-12-24 DIAGNOSIS — M546 Pain in thoracic spine: Secondary | ICD-10-CM

## 2020-12-28 ENCOUNTER — Emergency Department (HOSPITAL_COMMUNITY): Payer: 59

## 2020-12-28 ENCOUNTER — Encounter (HOSPITAL_COMMUNITY): Payer: Self-pay

## 2020-12-28 ENCOUNTER — Inpatient Hospital Stay (HOSPITAL_COMMUNITY)
Admission: EM | Admit: 2020-12-28 | Discharge: 2021-01-03 | DRG: 393 | Disposition: A | Discharge status: Home / Self Care | Payer: 59 | Attending: Family Medicine | Admitting: Family Medicine

## 2020-12-28 ENCOUNTER — Other Ambulatory Visit: Payer: Self-pay

## 2020-12-28 DIAGNOSIS — R578 Other shock: Secondary | ICD-10-CM | POA: Diagnosis present

## 2020-12-28 DIAGNOSIS — G4733 Obstructive sleep apnea (adult) (pediatric): Secondary | ICD-10-CM | POA: Diagnosis present

## 2020-12-28 DIAGNOSIS — Z86010 Personal history of colonic polyps: Secondary | ICD-10-CM

## 2020-12-28 DIAGNOSIS — E860 Dehydration: Secondary | ICD-10-CM | POA: Diagnosis not present

## 2020-12-28 DIAGNOSIS — E875 Hyperkalemia: Secondary | ICD-10-CM | POA: Diagnosis present

## 2020-12-28 DIAGNOSIS — Z9049 Acquired absence of other specified parts of digestive tract: Secondary | ICD-10-CM

## 2020-12-28 DIAGNOSIS — E039 Hypothyroidism, unspecified: Secondary | ICD-10-CM | POA: Diagnosis present

## 2020-12-28 DIAGNOSIS — K6389 Other specified diseases of intestine: Principal | ICD-10-CM | POA: Diagnosis present

## 2020-12-28 DIAGNOSIS — Z20822 Contact with and (suspected) exposure to covid-19: Secondary | ICD-10-CM | POA: Diagnosis present

## 2020-12-28 DIAGNOSIS — Z87891 Personal history of nicotine dependence: Secondary | ICD-10-CM

## 2020-12-28 DIAGNOSIS — E669 Obesity, unspecified: Secondary | ICD-10-CM | POA: Diagnosis present

## 2020-12-28 DIAGNOSIS — Z888 Allergy status to other drugs, medicaments and biological substances status: Secondary | ICD-10-CM

## 2020-12-28 DIAGNOSIS — I447 Left bundle-branch block, unspecified: Secondary | ICD-10-CM | POA: Diagnosis present

## 2020-12-28 DIAGNOSIS — Z79899 Other long term (current) drug therapy: Secondary | ICD-10-CM

## 2020-12-28 DIAGNOSIS — I5022 Chronic systolic (congestive) heart failure: Secondary | ICD-10-CM | POA: Diagnosis present

## 2020-12-28 DIAGNOSIS — R1310 Dysphagia, unspecified: Secondary | ICD-10-CM

## 2020-12-28 DIAGNOSIS — E222 Syndrome of inappropriate secretion of antidiuretic hormone: Secondary | ICD-10-CM | POA: Diagnosis present

## 2020-12-28 DIAGNOSIS — Z79890 Hormone replacement therapy (postmenopausal): Secondary | ICD-10-CM

## 2020-12-28 DIAGNOSIS — E872 Acidosis: Secondary | ICD-10-CM | POA: Diagnosis present

## 2020-12-28 DIAGNOSIS — E871 Hypo-osmolality and hyponatremia: Secondary | ICD-10-CM | POA: Diagnosis present

## 2020-12-28 DIAGNOSIS — I420 Dilated cardiomyopathy: Secondary | ICD-10-CM | POA: Diagnosis present

## 2020-12-28 DIAGNOSIS — E785 Hyperlipidemia, unspecified: Secondary | ICD-10-CM | POA: Diagnosis present

## 2020-12-28 DIAGNOSIS — E119 Type 2 diabetes mellitus without complications: Secondary | ICD-10-CM | POA: Diagnosis present

## 2020-12-28 DIAGNOSIS — Z452 Encounter for adjustment and management of vascular access device: Secondary | ICD-10-CM

## 2020-12-28 DIAGNOSIS — I10 Essential (primary) hypertension: Secondary | ICD-10-CM | POA: Diagnosis present

## 2020-12-28 DIAGNOSIS — N179 Acute kidney failure, unspecified: Secondary | ICD-10-CM | POA: Diagnosis present

## 2020-12-28 DIAGNOSIS — Z6839 Body mass index (BMI) 39.0-39.9, adult: Secondary | ICD-10-CM | POA: Diagnosis not present

## 2020-12-28 DIAGNOSIS — I110 Hypertensive heart disease with heart failure: Secondary | ICD-10-CM | POA: Diagnosis present

## 2020-12-28 DIAGNOSIS — R1010 Upper abdominal pain, unspecified: Secondary | ICD-10-CM

## 2020-12-28 DIAGNOSIS — R5381 Other malaise: Secondary | ICD-10-CM | POA: Diagnosis present

## 2020-12-28 DIAGNOSIS — Z823 Family history of stroke: Secondary | ICD-10-CM

## 2020-12-28 DIAGNOSIS — K219 Gastro-esophageal reflux disease without esophagitis: Secondary | ICD-10-CM | POA: Diagnosis present

## 2020-12-28 DIAGNOSIS — Z801 Family history of malignant neoplasm of trachea, bronchus and lung: Secondary | ICD-10-CM

## 2020-12-28 DIAGNOSIS — Z23 Encounter for immunization: Secondary | ICD-10-CM

## 2020-12-28 DIAGNOSIS — Z833 Family history of diabetes mellitus: Secondary | ICD-10-CM

## 2020-12-28 HISTORY — DX: Disorder of thyroid, unspecified: E07.9

## 2020-12-28 LAB — COMPREHENSIVE METABOLIC PANEL
ALT: 27 U/L (ref 0–44)
AST: 18 U/L (ref 15–41)
Albumin: 4.1 g/dL (ref 3.5–5.0)
Alkaline Phosphatase: 43 U/L (ref 38–126)
Anion gap: 10 (ref 5–15)
BUN: 124 mg/dL — ABNORMAL HIGH (ref 8–23)
CO2: 12 mmol/L — ABNORMAL LOW (ref 22–32)
Calcium: 8.3 mg/dL — ABNORMAL LOW (ref 8.9–10.3)
Chloride: 104 mmol/L (ref 98–111)
Creatinine, Ser: 3.34 mg/dL — ABNORMAL HIGH (ref 0.61–1.24)
GFR, Estimated: 20 mL/min — ABNORMAL LOW (ref >60)
Glucose, Bld: 119 mg/dL — ABNORMAL HIGH (ref 70–99)
Potassium: 5.9 mmol/L — ABNORMAL HIGH (ref 3.5–5.1)
Sodium: 126 mmol/L — ABNORMAL LOW (ref 135–145)
Total Bilirubin: 0.2 mg/dL — ABNORMAL LOW (ref 0.3–1.2)
Total Protein: 7.6 g/dL (ref 6.5–8.1)

## 2020-12-28 LAB — RESP PANEL BY RT-PCR (FLU A&B, COVID) ARPGX2
Influenza A by PCR: NEGATIVE
Influenza B by PCR: NEGATIVE
SARS Coronavirus 2 by RT PCR: NEGATIVE

## 2020-12-28 LAB — CBC
HCT: 37.5 % — ABNORMAL LOW (ref 39.0–52.0)
Hemoglobin: 13 g/dL (ref 13.0–17.0)
MCH: 33 pg (ref 26.0–34.0)
MCHC: 34.7 g/dL (ref 30.0–36.0)
MCV: 95.2 fL (ref 80.0–100.0)
Platelets: 221 10*3/uL (ref 150–400)
RBC: 3.94 MIL/uL — ABNORMAL LOW (ref 4.22–5.81)
RDW: 12.8 % (ref 11.5–15.5)
WBC: 15.5 10*3/uL — ABNORMAL HIGH (ref 4.0–10.5)
nRBC: 0 % (ref 0.0–0.2)

## 2020-12-28 LAB — LIPASE, BLOOD: Lipase: 67 U/L — ABNORMAL HIGH (ref 11–51)

## 2020-12-28 LAB — URINALYSIS, ROUTINE W REFLEX MICROSCOPIC
Bacteria, UA: NONE SEEN
Bilirubin Urine: NEGATIVE
Glucose, UA: >=500 mg/dL — AB
Ketones, ur: NEGATIVE mg/dL
Leukocytes,Ua: NEGATIVE
Nitrite: NEGATIVE
Protein, ur: NEGATIVE mg/dL
Specific Gravity, Urine: 1.01 (ref 1.005–1.030)
pH: 5 (ref 5.0–8.0)

## 2020-12-28 LAB — LACTIC ACID, PLASMA
Lactic Acid, Venous: 0.9 mmol/L (ref 0.5–1.9)
Lactic Acid, Venous: 1.4 mmol/L (ref 0.5–1.9)

## 2020-12-28 MED ORDER — ONDANSETRON HCL 4 MG/2ML IJ SOLN: 4.0000 mg | Freq: Four times a day (QID) | INTRAMUSCULAR | Status: DC | PRN

## 2020-12-28 MED ORDER — PANTOPRAZOLE SODIUM 40 MG IV SOLR
40.0000 mg | Freq: Once | INTRAVENOUS | Status: AC
  Administered 2020-12-28: 40 mg via INTRAVENOUS
  Filled 2020-12-28: qty 40

## 2020-12-28 MED ORDER — ACETAMINOPHEN 650 MG RE SUPP: 650.0000 mg | Freq: Four times a day (QID) | RECTAL | Status: DC | PRN

## 2020-12-28 MED ORDER — CHLORHEXIDINE GLUCONATE CLOTH 2 % EX PADS
6.0000 | MEDICATED_PAD | Freq: Every day | CUTANEOUS | Status: DC
  Administered 2020-12-29 – 2021-01-01 (×3): 6 via TOPICAL

## 2020-12-28 MED ORDER — ACETAMINOPHEN 325 MG PO TABS
650.0000 mg | ORAL_TABLET | Freq: Four times a day (QID) | ORAL | Status: DC | PRN
  Administered 2020-12-30: 650 mg via ORAL
  Filled 2020-12-28: qty 2

## 2020-12-28 MED ORDER — ONDANSETRON HCL 4 MG PO TABS: 4.0000 mg | ORAL_TABLET | Freq: Four times a day (QID) | ORAL | Status: DC | PRN

## 2020-12-28 MED ORDER — INSULIN ASPART 100 UNIT/ML IJ SOLN
0.0000 [IU] | INTRAMUSCULAR | Status: DC
  Administered 2020-12-30 (×3): 2 [IU] via SUBCUTANEOUS

## 2020-12-28 MED ORDER — CALCIUM GLUCONATE-NACL 1-0.675 GM/50ML-% IV SOLN
1.0000 g | Freq: Once | INTRAVENOUS | Status: AC
  Administered 2020-12-28: 1000 mg via INTRAVENOUS
  Filled 2020-12-28: qty 50

## 2020-12-28 MED ORDER — METRONIDAZOLE 500 MG/100ML IV SOLN
500.0000 mg | Freq: Two times a day (BID) | INTRAVENOUS | Status: DC
  Administered 2020-12-28: 500 mg via INTRAVENOUS
  Filled 2020-12-28: qty 100

## 2020-12-28 MED ORDER — PANTOPRAZOLE SODIUM 40 MG IV SOLR
40.0000 mg | Freq: Every day | INTRAVENOUS | Status: DC
  Administered 2020-12-29 – 2021-01-02 (×6): 40 mg via INTRAVENOUS
  Filled 2020-12-28 (×6): qty 40

## 2020-12-28 MED ORDER — LACTATED RINGERS IV SOLN: INTRAVENOUS | Status: DC

## 2020-12-28 MED ORDER — LEVOTHYROXINE SODIUM 100 MCG/5ML IV SOLN
25.0000 ug | Freq: Every day | INTRAVENOUS | Status: DC
  Administered 2020-12-31 – 2021-01-02 (×3): 25 ug via INTRAVENOUS
  Filled 2020-12-28 (×4): qty 5

## 2020-12-28 MED ORDER — SODIUM CHLORIDE 0.9 % IV BOLUS
2000.0000 mL | Freq: Once | INTRAVENOUS | Status: AC
  Administered 2020-12-28: 2000 mL via INTRAVENOUS

## 2020-12-28 NOTE — Progress Notes (Signed)
[facility]  Discussed with Dr. Nehemiah Settle. Pneumatosis of some loops of small bowel, no thickening, no inflammation, no free fluid or air. Has been not eating and drinking well with Ozempic  injections. Main complaint was food sticking in his throat and causing pain; AKI with hyperkalemia. Needs resuscitation and antibiotics now, may need exploration given findings but will get him resuscitated and continue to monitor. Worsening exam or labs will indicate need for exploration. Dr. Nehemiah Settle will keep me updated. Lactic 0.9.  Curlene Labrum, MD [facility] [address] [place], [place] [address] [phone] (office)

## 2020-12-28 NOTE — H&P (Signed)
History and Physical  Nicholas Gray LKG:[id number] DOB: 12/23/1960 DOA: 12/28/2020  Referring physician: Dr Dewayne Hatch, ED physician PCP: [facility]  Outpatient Specialists:   Patient Coming From: home  Chief Complaint: abdominal pain, decreased appetite  HPI: Nicholas Gray is a 60 y.o. male with a history of cardiomyopathy and HFrEF with EF of 45% on echo 11/2020, GERD, hypertension, hyperlipidemia, hypothyroidism, history of cholecystectomy in 2019.  Due to obesity and hyperlipidemia, the patient was placed on Ozempic around 8/18 shortly afterwards, the patient started having abdominal pain with symptoms of food or liquid getting stuck in his esophagus whenever he eats or drinks anything.  The symptoms have been worsening to the point where he does not want to eat or drink anything.  In addition, the patient has mild constant pain just superior to his umbilicus.  He does have an increase in this pain mildly when he eats or drinks.  No other palliating or provoking factors.  He denies fevers but does feel quite cold and having some shaking.  Denies chest pain, shortness of breath.  Has been having loose stools.  No blood in his stools.  Emergency Department Course: Sodium 126, potassium 5.9, bicarb 12, BUN 124, creatinine 3.34.  White count 15.5.  CT of the abdomen without contrast shows pneumatosis intestinalis of the small intestines without evidence of inflammation.  There is also diverticulosis without infection.  Review of Systems:   Pt denies any fevers, nausea, vomiting, constipation, shortness of breath, dyspnea on exertion, orthopnea, cough, wheezing, palpitations, headache, vision changes, lightheadedness, dizziness, melena, rectal bleeding.  Review of systems are otherwise negative  Past Medical History:  Diagnosis Date   Arthritis    gout   Cardiomyopathy    EF 30-40% (Normal coronaries cath 2010).  Echo 2015 EF 40%   CHF (congestive heart failure)  (HCC)    Colonic polyp    Diverticulosis of colon    GERD (gastroesophageal reflux disease)    History of chicken pox    HLD (hyperlipidemia)    recent   HTN (hypertension)    recent   Sleep apnea    does use CPAP.     Thyroid disease    Past Surgical History:  Procedure Laterality Date   BILIARY STENT PLACEMENT  11/08/2017   Procedure: BILIARY STENT PLACEMENT;  Surgeon: Carol Ada, MD;  Location: [facility] ENDOSCOPY;  Service: Endoscopy;;   BIOPSY  03/08/2018   Procedure: BIOPSY;  Surgeon: Doran Stabler, MD;  Location: [facility] ENDOSCOPY;  Service: Gastroenterology;;   CARDIAC CATHETERIZATION  2010   CHOLECYSTECTOMY N/A 09/07/2017   Procedure: LAPAROSCOPIC CHOLECYSTECTOMY;  Surgeon: Mickeal Skinner, MD;  Location: [place];  Service: General;  Laterality: N/A;  LAPAROSCOPIC CHOLECYSTECTOMY   COLONOSCOPY WITH PROPOFOL N/A 03/08/2018   Procedure: COLONOSCOPY WITH PROPOFOL;  Surgeon: Doran Stabler, MD;  Location: [facility] ENDOSCOPY;  Service: Gastroenterology;  Laterality: N/A;   ERCP N/A 09/11/2017   Procedure: ENDOSCOPIC RETROGRADE CHOLANGIOPANCREATOGRAPHY (ERCP);  Surgeon: Doran Stabler, MD;  Location: [place];  Service: Gastroenterology;  Laterality: N/A;   ERCP N/A 11/08/2017   Procedure: ENDOSCOPIC RETROGRADE CHOLANGIOPANCREATOGRAPHY (ERCP);  Surgeon: Carol Ada, MD;  Location: [place];  Service: Endoscopy;  Laterality: N/A;   ERCP N/A 01/11/2018   Procedure: ENDOSCOPIC RETROGRADE CHOLANGIOPANCREATOGRAPHY;  Surgeon: Doran Stabler, MD;  Location: [facility] ENDOSCOPY;  Service: Gastroenterology;  Laterality: N/A;  With Balloon Sweep of Ducts   REMOVAL OF STONES  11/08/2017   Procedure:  REMOVAL OF STONES;  Surgeon: Carol Ada, MD;  Location: [facility] ENDOSCOPY;  Service: Endoscopy;;   RIGHT/LEFT HEART CATH AND CORONARY ANGIOGRAPHY N/A 10/12/2017   Procedure: RIGHT/LEFT HEART CATH AND CORONARY ANGIOGRAPHY;  Surgeon: Larey Dresser, MD;  Location: [place] CV LAB;  Service:  Cardiovascular;  Laterality: N/A;   SPHINCTEROTOMY  11/08/2017   Procedure: SPHINCTEROTOMY;  Surgeon: Carol Ada, MD;  Location: [place];  Service: Endoscopy;;   STENT REMOVAL  01/11/2018   Procedure: STENT REMOVAL;  Surgeon: Doran Stabler, MD;  Location: [facility] ENDOSCOPY;  Service: Gastroenterology;;   TONSILLECTOMY AND ADENOIDECTOMY     Social History:  reports that he quit smoking about 40 years ago. His smoking use included cigarettes. He has a 4.50 pack-year smoking history. He has never used smokeless tobacco. He reports that he does not drink alcohol and does not use drugs. Patient lives at home  Allergies  Allergen Reactions   Ozempic (0.25 Or 0.5 Mg-Dose) [Semaglutide(0.25 Or 0.5mg -Dos)]     GI upset on 0.25mg  dose    Family History  Problem Relation Age of Onset   Arthritis Other        family history   Diabetes Other        family history   Hyperlipidemia Other        family history   Lung cancer Other        family history   Stroke Other        family history      Prior to Admission medications   Medication Sig Start Date End Date Taking? Authorizing Provider  allopurinol (ZYLOPRIM) 100 MG tablet Take 100 mg by mouth 2 (two) times daily.    Yes [provider]  carvedilol (COREG) 25 MG tablet TAKE (1) TABLET BY MOUTH TWICE DAILY WITH A MEAL. 12/14/20  Yes Larey Dresser, MD  Famotidine (PEPCID PO) Take by mouth daily.   Yes [provider]  FARXIGA 10 MG TABS tablet TAKE 1 TABLET BEFORE BREAKFAST. 12/04/20  Yes Larey Dresser, MD  furosemide (LASIX) 20 MG tablet Take 1 tablet (20 mg total) by mouth as needed for fluid or edema. 11/30/17  Yes Larey Dresser, MD  levothyroxine (SYNTHROID) 50 MCG tablet Take 50 mcg by mouth daily. 12/25/20  Yes [provider]  predniSONE (DELTASONE) 10 MG tablet Take by mouth See admin instructions. Today (12/28/2020) was the first day of taper to 4 tablets per day for 3 days, then 3 tablets for 3  days, then 2 tablets for 3 days, then 1 tablet for 3 days 12/24/20  Yes [provider]  rosuvastatin (CRESTOR) 10 MG tablet Take 1 tablet (10 mg total) by mouth daily. 11/20/20 11/20/21 Yes Larey Dresser, MD  sacubitril-valsartan (ENTRESTO) 97-103 MG Take 1 tablet by mouth 2 (two) times daily. 08/08/20  Yes Larey Dresser, MD  spironolactone (ALDACTONE) 25 MG tablet TAKE 1 TABLET BY MOUTH DAILY. 12/20/20  Yes Larey Dresser, MD    Physical Exam: BP (!) 93/47   Pulse 61   Temp 97.7 F (36.5 C) (Oral)   Resp 18   Ht 5\' 10"  (1.778 m)   Wt 124.7 kg   SpO2 97%   BMI 39.46 kg/m   General: Older male. Awake and alert and oriented x3. No acute cardiopulmonary distress.  HEENT: Normocephalic atraumatic.  Right and left ears normal in appearance.  Pupils equal, round, reactive to light. Extraocular muscles are intact. Sclerae anicteric and noninjected.  Moist mucosal membranes. No mucosal lesions.  Neck: Neck supple without lymphadenopathy. No carotid bruits. No masses palpated.  Cardiovascular: Regular rate with normal S1-S2 sounds. No murmurs, rubs, gallops auscultated. No JVD.  Respiratory: Good respiratory effort with no wheezes, rales, rhonchi. Lungs clear to auscultation bilaterally.  No accessory muscle use. Abdomen: Soft.  Tender in the periumbilical area, in particular just superior to the umbilicus.  There is mild guarding and mild rebound tenderness. No distension.  Hypoactive bowel sounds.  No masses or hepatosplenomegaly  Skin: No rashes, lesions, or ulcerations.  Dry, warm to touch. 2+ dorsalis pedis and radial pulses. Musculoskeletal: No calf or leg pain. All major joints not erythematous nontender.  No upper or lower joint deformation.  Good ROM.  No contractures  Psychiatric: Intact judgment and insight. Pleasant and cooperative. Neurologic: No focal neurological deficits. Strength is 5/5 and symmetric in upper and lower extremities.  Cranial nerves II through XII are  grossly intact.           Labs on Admission: I have personally reviewed following labs and imaging studies  CBC: Recent Labs  Lab 12/28/20 1620  WBC 15.5*  HGB 13.0  HCT 37.5*  MCV 95.2  PLT 144   Basic Metabolic Panel: Recent Labs  Lab 12/28/20 1620  NA 126*  K 5.9*  CL 104  CO2 12*  GLUCOSE 119*  BUN 124*  CREATININE 3.34*  CALCIUM 8.3*   GFR: Estimated Creatinine Clearance: 30.8 mL/min (A) (by C-G formula based on SCr of 3.34 mg/dL (H)). Liver Function Tests: Recent Labs  Lab 12/28/20 1620  AST 18  ALT 27  ALKPHOS 43  BILITOT 0.2*  PROT 7.6  ALBUMIN 4.1   Recent Labs  Lab 12/28/20 1620  LIPASE 67*   No results for input(s): AMMONIA in the last 168 hours. Coagulation Profile: No results for input(s): INR, PROTIME in the last 168 hours. Cardiac Enzymes: No results for input(s): CKTOTAL, CKMB, CKMBINDEX, TROPONINI in the last 168 hours. BNP (last 3 results) No results for input(s): PROBNP in the last 8760 hours. HbA1C: No results for input(s): HGBA1C in the last 72 hours. CBG: No results for input(s): GLUCAP in the last 168 hours. Lipid Profile: No results for input(s): CHOL, HDL, LDLCALC, TRIG, CHOLHDL, LDLDIRECT in the last 72 hours. Thyroid Function Tests: No results for input(s): TSH, T4TOTAL, FREET4, T3FREE, THYROIDAB in the last 72 hours. Anemia Panel: No results for input(s): VITAMINB12, FOLATE, FERRITIN, TIBC, IRON, RETICCTPCT in the last 72 hours. Urine analysis: No results found for: COLORURINE, APPEARANCEUR, LABSPEC, PHURINE, GLUCOSEU, HGBUR, BILIRUBINUR, KETONESUR, PROTEINUR, UROBILINOGEN, NITRITE, LEUKOCYTESUR Sepsis Labs: @LABRCNTIP (procalcitonin:4,lacticidven:4) )No results found for this or any previous visit (from the past 240 hour(s)).   Radiological Exams on Admission: CT ABDOMEN PELVIS WO CONTRAST  Result Date: 12/28/2020 CLINICAL DATA:  Epigastric abdominal pain for 1 month. EXAM: CT ABDOMEN AND PELVIS WITHOUT CONTRAST  TECHNIQUE: Multidetector CT imaging of the abdomen and pelvis was performed following the standard protocol without IV contrast. COMPARISON:  November 06, 2017. FINDINGS: Lower chest: No acute abnormality. Hepatobiliary: No focal liver abnormality is seen. Status post cholecystectomy. No biliary dilatation. Pancreas: Unremarkable. No pancreatic ductal dilatation or surrounding inflammatory changes. Spleen: Normal in size without focal abnormality. Adrenals/Urinary Tract: Adrenal glands are unremarkable. Kidneys are normal, without renal calculi, focal lesion, or hydronephrosis. Bladder is unremarkable. Stomach/Bowel: Stomach is unremarkable. The appendix is not visualized. Diverticulosis of the sigmoid colon is noted without inflammation. The colon is nondilated. However, there is interval  development of pneumatosis involving small bowel loops without inflammatory change and minimal proximal small bowel dilatation. Vascular/Lymphatic: Aortic atherosclerosis. No enlarged abdominal or pelvic lymph nodes. Reproductive: Prostate is unremarkable. Other: Bilateral fat containing inguinal hernias are again noted. No ascites is noted. Musculoskeletal: No acute or significant osseous findings. IMPRESSION: Interval development of extensive pneumatosis involving small bowel loops without definite inflammatory change. Minimal proximal small bowel dilatation is noted. This may simply represent benign pneumatosis, but ischemic bowel can not be excluded although felt to be unlikely. Clinical correlation is recommended. Differential diagnosis for benign pneumatosis can include pulmonary disease, intestinal inflammation iatrogenic due to procedures or certain medications. Critical Value/emergent results were called by telephone at the time of interpretation on 12/28/2020 at 7:21 pm to provider [facility] ZAMMIT , who verbally acknowledged these results. Electronically Signed   By: Marijo Conception M.D.   On: 12/28/2020 19:21    EKG:  Independently reviewed.  Sinus rhythm with left bundle branch block.  No acute ST changes.  Assessment/Plan: Principal Problem:   Pneumatosis intestinalis Active Problems:   Essential hypertension   Dilated cardiomyopathy (HCC)   LBBB (left bundle branch block)   OSA (obstructive sleep apnea)   Chronic systolic CHF (congestive heart failure) (HCC)   Hyponatremia   Hyperkalemia   Acute renal injury ([place])   Dehydration    This patient was discussed with the ED physician, including pertinent vitals, physical exam findings, labs, and imaging.  We also discussed care given by the ED provider.  Pneumatosis intestinalis with metabolic acidosis Lactic acid ordered -resulted at 0.9, which is reassuring against acute bowel infarct and ischemia. Continue aggressive fluid management Start Flagyl Recheck CBC in the morning I did discuss this patient with Dr. Constance Haw of general surgery.  As lactic acid is normal, we do not feel that this patient is in need of emergent surgery.  At this point, the etiology is unknown.  Question is whether this is secondary to the Ozempic.  There does appear to be at least 1 case study of pneumatosis intestinalis following Ozempic use. Hyponatremia Continue with aggressive fluid resuscitation Check BMP later tonight and tomorrow morning Hyperkalemia Peaked T waves Will give calcium gluconate Anticipate that the potassium will decrease with fluid resuscitation  Dehydration with acute renal injury Continue with aggressive fluid resuscitation Recheck creatinine in the morning  Dilated cardiomyopathy with HFrEF, left bundle branch block, hypertension Patient mildly hypotensive Hold antihypertensive therapy, as well as diuretics. Hold aspirin Obstructive sleep apnea  DVT prophylaxis: SCDs Consultants: GI, general surgery Code Status: Full code Family Communication: None Disposition Plan: Pending   Truett Mainland, DO

## 2020-12-28 NOTE — ED Notes (Signed)
Patient transported to CT 

## 2020-12-28 NOTE — ED Triage Notes (Signed)
Pt presents to ED with complaints of epigastric pain started 1 week ago. Pt states when he eats the first swallow of food has a hard time going down.

## 2020-12-28 NOTE — ED Provider Notes (Signed)
[facility] EMERGENCY DEPARTMENT Provider Note   CSN: [id number] Arrival date & time: 12/28/20  1452     History Chief Complaint  Patient presents with   Abdominal Pain    Nicholas Gray is a 60 y.o. male.  Patient presents stating that for the last month he has not been eating or drinking much because when he consumes liquids or food he gets some pain in his epigastric area.  Patient complains of being weak  The history is provided by the patient and medical records. No language interpreter was used.  Abdominal Pain Pain location:  Generalized Pain quality: aching   Pain radiates to:  Does not radiate Pain severity:  Mild Onset quality:  Gradual Timing:  Constant Progression:  Waxing and waning Chronicity:  New Context: not alcohol use   Relieved by:  Nothing Worsened by:  Nothing Associated symptoms: no chest pain, no cough, no diarrhea, no fatigue and no hematuria       Past Medical History:  Diagnosis Date   Arthritis    gout   Cardiomyopathy    EF 30-40% (Normal coronaries cath 2010).  Echo 2015 EF 40%   CHF (congestive heart failure) (HCC)    Colonic polyp    Diverticulosis of colon    GERD (gastroesophageal reflux disease)    History of chicken pox    HLD (hyperlipidemia)    recent   HTN (hypertension)    recent   Sleep apnea    does use CPAP.     Thyroid disease     Patient Active Problem List   Diagnosis Date Noted   Choledocholithiasis 11/06/2017   Bile leak    RUQ pain    Chronic systolic CHF (congestive heart failure) ([place]) 09/03/2017   ACTINIC KERATOSIS 11/01/2009   Dilated cardiomyopathy ([place]) 06/12/2009   OSA (obstructive sleep apnea) 05/02/2009   Morbid obesity ([place]) 01/31/2009   LBBB (left bundle branch block) 12/25/2008   HYPERLIPIDEMIA 11/22/2008   COLONIC POLYPS 10/19/2008   Essential hypertension 10/19/2008   GERD 10/19/2008   DIVERTICULITIS, COLON 10/19/2008    Past Surgical History:  Procedure Laterality Date    BILIARY STENT PLACEMENT  11/08/2017   Procedure: BILIARY STENT PLACEMENT;  Surgeon: Carol Ada, MD;  Location: [facility] ENDOSCOPY;  Service: Endoscopy;;   BIOPSY  03/08/2018   Procedure: BIOPSY;  Surgeon: Doran Stabler, MD;  Location: [facility] ENDOSCOPY;  Service: Gastroenterology;;   CARDIAC CATHETERIZATION  2010   CHOLECYSTECTOMY N/A 09/07/2017   Procedure: LAPAROSCOPIC CHOLECYSTECTOMY;  Surgeon: Mickeal Skinner, MD;  Location: [place];  Service: General;  Laterality: N/A;  LAPAROSCOPIC CHOLECYSTECTOMY   COLONOSCOPY WITH PROPOFOL N/A 03/08/2018   Procedure: COLONOSCOPY WITH PROPOFOL;  Surgeon: Doran Stabler, MD;  Location: [facility] ENDOSCOPY;  Service: Gastroenterology;  Laterality: N/A;   ERCP N/A 09/11/2017   Procedure: ENDOSCOPIC RETROGRADE CHOLANGIOPANCREATOGRAPHY (ERCP);  Surgeon: Doran Stabler, MD;  Location: [place];  Service: Gastroenterology;  Laterality: N/A;   ERCP N/A 11/08/2017   Procedure: ENDOSCOPIC RETROGRADE CHOLANGIOPANCREATOGRAPHY (ERCP);  Surgeon: Carol Ada, MD;  Location: [place];  Service: Endoscopy;  Laterality: N/A;   ERCP N/A 01/11/2018   Procedure: ENDOSCOPIC RETROGRADE CHOLANGIOPANCREATOGRAPHY;  Surgeon: Doran Stabler, MD;  Location: [facility] ENDOSCOPY;  Service: Gastroenterology;  Laterality: N/A;  With Balloon Sweep of Ducts   REMOVAL OF STONES  11/08/2017   Procedure: REMOVAL OF STONES;  Surgeon: Carol Ada, MD;  Location: [place];  Service: Endoscopy;;   RIGHT/LEFT HEART CATH  AND CORONARY ANGIOGRAPHY N/A 10/12/2017   Procedure: RIGHT/LEFT HEART CATH AND CORONARY ANGIOGRAPHY;  Surgeon: Larey Dresser, MD;  Location: [place] CV LAB;  Service: Cardiovascular;  Laterality: N/A;   SPHINCTEROTOMY  11/08/2017   Procedure: SPHINCTEROTOMY;  Surgeon: Carol Ada, MD;  Location: [facility] ENDOSCOPY;  Service: Endoscopy;;   STENT REMOVAL  01/11/2018   Procedure: STENT REMOVAL;  Surgeon: Doran Stabler, MD;  Location: [facility] ENDOSCOPY;  Service: Gastroenterology;;    TONSILLECTOMY AND ADENOIDECTOMY         Family History  Problem Relation Age of Onset   Arthritis Other        family history   Diabetes Other        family history   Hyperlipidemia Other        family history   Lung cancer Other        family history   Stroke Other        family history    Social History   Tobacco Use   Smoking status: Former    Packs/day: 1.50    Years: 3.00    Pack years: 4.50    Types: Cigarettes    Quit date: 04/07/1980    Years since quitting: 40.7   Smokeless tobacco: Never  Vaping Use   Vaping Use: Never used  Substance Use Topics   Alcohol use: No   Drug use: No    Comment: quit in 1999, marijuana    Home Medications Prior to Admission medications   Medication Sig Start Date End Date Taking? Authorizing Provider  allopurinol (ZYLOPRIM) 100 MG tablet Take 100 mg by mouth 2 (two) times daily.    Yes [provider]  carvedilol (COREG) 25 MG tablet TAKE (1) TABLET BY MOUTH TWICE DAILY WITH A MEAL. 12/14/20  Yes Larey Dresser, MD  Famotidine (PEPCID PO) Take by mouth daily.   Yes [provider]  FARXIGA 10 MG TABS tablet TAKE 1 TABLET BEFORE BREAKFAST. 12/04/20  Yes Larey Dresser, MD  furosemide (LASIX) 20 MG tablet Take 1 tablet (20 mg total) by mouth as needed for fluid or edema. 11/30/17  Yes Larey Dresser, MD  levothyroxine (SYNTHROID) 50 MCG tablet Take 50 mcg by mouth daily. 12/25/20  Yes [provider]  predniSONE (DELTASONE) 10 MG tablet Take by mouth See admin instructions. Today (12/28/2020) was the first day of taper to 4 tablets per day for 3 days, then 3 tablets for 3 days, then 2 tablets for 3 days, then 1 tablet for 3 days 12/24/20  Yes [provider]  rosuvastatin (CRESTOR) 10 MG tablet Take 1 tablet (10 mg total) by mouth daily. 11/20/20 11/20/21 Yes Larey Dresser, MD  sacubitril-valsartan (ENTRESTO) 97-103 MG Take 1 tablet by mouth 2 (two) times daily. 08/08/20  Yes Larey Dresser, MD   spironolactone (ALDACTONE) 25 MG tablet TAKE 1 TABLET BY MOUTH DAILY. 12/20/20  Yes Larey Dresser, MD    Allergies    Ozempic (0.25 or 0.5 mg-dose) [semaglutide(0.25 or 0.5mg -dos)]  Review of Systems   Review of Systems  Constitutional:  Negative for appetite change and fatigue.  HENT:  Negative for congestion, ear discharge and sinus pressure.   Eyes:  Negative for discharge.  Respiratory:  Negative for cough.   Cardiovascular:  Negative for chest pain.  Gastrointestinal:  Positive for abdominal pain. Negative for diarrhea.  Genitourinary:  Negative for frequency and hematuria.  Musculoskeletal:  Negative for back pain.  Skin:  Negative for rash.  Neurological:  Negative for seizures and headaches.  Psychiatric/Behavioral:  Negative for hallucinations.    Physical Exam Updated Vital Signs BP (!) 92/51   Pulse (!) 58   Temp 97.9 F (36.6 C) (Oral)   Resp 18   Ht 5\' 10"  (1.778 m)   Wt 124.7 kg   SpO2 98%   BMI 39.46 kg/m   Physical Exam Vitals and nursing note reviewed.  Constitutional:      Appearance: He is well-developed.  HENT:     Head: Normocephalic.     Nose: Nose normal.  Eyes:     General: No scleral icterus.    Conjunctiva/sclera: Conjunctivae normal.  Neck:     Thyroid: No thyromegaly.  Cardiovascular:     Rate and Rhythm: Normal rate and regular rhythm.     Heart sounds: No murmur heard.   No friction rub. No gallop.  Pulmonary:     Breath sounds: No stridor. No wheezing or rales.  Chest:     Chest wall: No tenderness.  Abdominal:     General: There is no distension.     Tenderness: There is no abdominal tenderness. There is no rebound.  Musculoskeletal:        General: Normal range of motion.     Cervical back: Neck supple.  Lymphadenopathy:     Cervical: No cervical adenopathy.  Skin:    Findings: No erythema or rash.  Neurological:     Mental Status: He is alert and oriented to person, place, and time.     Motor: No abnormal muscle  tone.     Coordination: Coordination normal.  Psychiatric:        Behavior: Behavior normal.    ED Results / Procedures / Treatments   Labs (all labs ordered are listed, but only abnormal results are displayed) Labs Reviewed  LIPASE, BLOOD - Abnormal; Notable for the following components:      Result Value   Lipase 67 (*)    All other components within normal limits  COMPREHENSIVE METABOLIC PANEL - Abnormal; Notable for the following components:   Sodium 126 (*)    Potassium 5.9 (*)    CO2 12 (*)    Glucose, Bld 119 (*)    BUN 124 (*)    Creatinine, Ser 3.34 (*)    Calcium 8.3 (*)    Total Bilirubin 0.2 (*)    GFR, Estimated 20 (*)    All other components within normal limits  CBC - Abnormal; Notable for the following components:   WBC 15.5 (*)    RBC 3.94 (*)    HCT 37.5 (*)    All other components within normal limits  URINALYSIS, ROUTINE W REFLEX MICROSCOPIC  LACTIC ACID, PLASMA  LACTIC ACID, PLASMA    EKG EKG Interpretation  Date/Time:  Friday December 28 2020 16:04:51 EDT Ventricular Rate:  69 PR Interval:  182 QRS Duration: 162 QT Interval:  400 QTC Calculation: 428 R Axis:   -17 Text Interpretation: Normal sinus rhythm Left bundle branch block Abnormal ECG Confirmed by Milton Ferguson [phone]) on 12/28/2020 6:10:39 PM  Radiology CT ABDOMEN PELVIS WO CONTRAST  Result Date: 12/28/2020 CLINICAL DATA:  Epigastric abdominal pain for 1 month. EXAM: CT ABDOMEN AND PELVIS WITHOUT CONTRAST TECHNIQUE: Multidetector CT imaging of the abdomen and pelvis was performed following the standard protocol without IV contrast. COMPARISON:  November 06, 2017. FINDINGS: Lower chest: No acute abnormality. Hepatobiliary: No focal liver abnormality is seen. Status post cholecystectomy.  No biliary dilatation. Pancreas: Unremarkable. No pancreatic ductal dilatation or surrounding inflammatory changes. Spleen: Normal in size without focal abnormality. Adrenals/Urinary Tract: Adrenal glands  are unremarkable. Kidneys are normal, without renal calculi, focal lesion, or hydronephrosis. Bladder is unremarkable. Stomach/Bowel: Stomach is unremarkable. The appendix is not visualized. Diverticulosis of the sigmoid colon is noted without inflammation. The colon is nondilated. However, there is interval development of pneumatosis involving small bowel loops without inflammatory change and minimal proximal small bowel dilatation. Vascular/Lymphatic: Aortic atherosclerosis. No enlarged abdominal or pelvic lymph nodes. Reproductive: Prostate is unremarkable. Other: Bilateral fat containing inguinal hernias are again noted. No ascites is noted. Musculoskeletal: No acute or significant osseous findings. IMPRESSION: Interval development of extensive pneumatosis involving small bowel loops without definite inflammatory change. Minimal proximal small bowel dilatation is noted. This may simply represent benign pneumatosis, but ischemic bowel can not be excluded although felt to be unlikely. Clinical correlation is recommended. Differential diagnosis for benign pneumatosis can include pulmonary disease, intestinal inflammation iatrogenic due to procedures or certain medications. Critical Value/emergent results were called by telephone at the time of interpretation on 12/28/2020 at 7:21 pm to provider [facility] Arieonna Medine , who verbally acknowledged these results. Electronically Signed   By: Marijo Conception M.D.   On: 12/28/2020 19:21    Procedures Procedures   Medications Ordered in ED Medications  sodium chloride 0.9 % bolus 2,000 mL (2,000 mLs Intravenous New Bag/Given 12/28/20 1855)  pantoprazole (PROTONIX) injection 40 mg (40 mg Intravenous Given 12/28/20 1856)   CRITICAL CARE Performed by: Milton Ferguson Total critical care time: 40 minutes Critical care time was exclusive of separately billable procedures and treating other patients. Critical care was necessary to treat or prevent imminent or life-threatening  deterioration. Critical care was time spent personally by me on the following activities: development of treatment plan with patient and/or surrogate as well as nursing, discussions with consultants, evaluation of patient's response to treatment, examination of patient, obtaining history from patient or surrogate, ordering and performing treatments and interventions, ordering and review of laboratory studies, ordering and review of radiographic studies, pulse oximetry and re-evaluation of patient's condition. Patient CT scan shows pneumatosis in the small bowel.  I spoke with Dr. Gala Romney gastroenterologist and he recommended the patient to be on clear liquids and get hydrated.  Patient will be seen by GI tomorrow ED Course  I have reviewed the triage vital signs and the nursing notes.  Pertinent labs & imaging results that were available during my care of the patient were reviewed by me and considered in my medical decision making (see chart for details).    MDM Rules/Calculators/A&P                           Patient with severe AKI.  He will be hydrated with saline and admitted by the hospitalist.  GI will consult tomorrow. Final Clinical Impression(s) / ED Diagnoses Final diagnoses:  Pain of upper abdomen    Rx / DC Orders ED Discharge Orders     None        Milton Ferguson, MD 12/31/20 1137

## 2020-12-29 ENCOUNTER — Inpatient Hospital Stay (HOSPITAL_COMMUNITY): Payer: 59

## 2020-12-29 DIAGNOSIS — E875 Hyperkalemia: Secondary | ICD-10-CM

## 2020-12-29 DIAGNOSIS — E860 Dehydration: Secondary | ICD-10-CM

## 2020-12-29 DIAGNOSIS — K6389 Other specified diseases of intestine: Secondary | ICD-10-CM | POA: Diagnosis not present

## 2020-12-29 DIAGNOSIS — I959 Hypotension, unspecified: Secondary | ICD-10-CM

## 2020-12-29 DIAGNOSIS — N179 Acute kidney failure, unspecified: Secondary | ICD-10-CM

## 2020-12-29 LAB — BASIC METABOLIC PANEL
Anion gap: 8 (ref 5–15)
Anion gap: 7 (ref 5–15)
Anion gap: 8 (ref 5–15)
Anion gap: 7 (ref 5–15)
Anion gap: 6 (ref 5–15)
BUN: 87 mg/dL — ABNORMAL HIGH (ref 8–23)
BUN: 100 mg/dL — ABNORMAL HIGH (ref 8–23)
BUN: 95 mg/dL — ABNORMAL HIGH (ref 8–23)
BUN: 84 mg/dL — ABNORMAL HIGH (ref 8–23)
BUN: 73 mg/dL — ABNORMAL HIGH (ref 8–23)
CO2: 10 mmol/L — ABNORMAL LOW (ref 22–32)
CO2: 12 mmol/L — ABNORMAL LOW (ref 22–32)
CO2: 15 mmol/L — ABNORMAL LOW (ref 22–32)
CO2: 16 mmol/L — ABNORMAL LOW (ref 22–32)
CO2: 19 mmol/L — ABNORMAL LOW (ref 22–32)
Calcium: 8 mg/dL — ABNORMAL LOW (ref 8.9–10.3)
Calcium: 8.1 mg/dL — ABNORMAL LOW (ref 8.9–10.3)
Calcium: 8.4 mg/dL — ABNORMAL LOW (ref 8.9–10.3)
Calcium: 8.5 mg/dL — ABNORMAL LOW (ref 8.9–10.3)
Calcium: 8.5 mg/dL — ABNORMAL LOW (ref 8.9–10.3)
Chloride: 110 mmol/L (ref 98–111)
Chloride: 108 mmol/L (ref 98–111)
Chloride: 107 mmol/L (ref 98–111)
Chloride: 104 mmol/L (ref 98–111)
Chloride: 104 mmol/L (ref 98–111)
Creatinine, Ser: 2.62 mg/dL — ABNORMAL HIGH (ref 0.61–1.24)
Creatinine, Ser: 2.61 mg/dL — ABNORMAL HIGH (ref 0.61–1.24)
Creatinine, Ser: 2.34 mg/dL — ABNORMAL HIGH (ref 0.61–1.24)
Creatinine, Ser: 2.21 mg/dL — ABNORMAL HIGH (ref 0.61–1.24)
Creatinine, Ser: 2 mg/dL — ABNORMAL HIGH (ref 0.61–1.24)
GFR, Estimated: 27 mL/min — ABNORMAL LOW (ref >60)
GFR, Estimated: 27 mL/min — ABNORMAL LOW (ref >60)
GFR, Estimated: 31 mL/min — ABNORMAL LOW (ref >60)
GFR, Estimated: 33 mL/min — ABNORMAL LOW (ref >60)
GFR, Estimated: 37 mL/min — ABNORMAL LOW (ref >60)
Glucose, Bld: 109 mg/dL — ABNORMAL HIGH (ref 70–99)
Glucose, Bld: 165 mg/dL — ABNORMAL HIGH (ref 70–99)
Glucose, Bld: 104 mg/dL — ABNORMAL HIGH (ref 70–99)
Glucose, Bld: 82 mg/dL (ref 70–99)
Glucose, Bld: 99 mg/dL (ref 70–99)
Potassium: 6.1 mmol/L — ABNORMAL HIGH (ref 3.5–5.1)
Potassium: 5.8 mmol/L — ABNORMAL HIGH (ref 3.5–5.1)
Potassium: 5.8 mmol/L — ABNORMAL HIGH (ref 3.5–5.1)
Potassium: 5.4 mmol/L — ABNORMAL HIGH (ref 3.5–5.1)
Potassium: 4.9 mmol/L (ref 3.5–5.1)
Sodium: 128 mmol/L — ABNORMAL LOW (ref 135–145)
Sodium: 127 mmol/L — ABNORMAL LOW (ref 135–145)
Sodium: 130 mmol/L — ABNORMAL LOW (ref 135–145)
Sodium: 127 mmol/L — ABNORMAL LOW (ref 135–145)
Sodium: 129 mmol/L — ABNORMAL LOW (ref 135–145)

## 2020-12-29 LAB — CBC WITH DIFFERENTIAL/PLATELET
Abs Immature Granulocytes: 0.06 10*3/uL (ref 0.00–0.07)
Basophils Absolute: 0 10*3/uL (ref 0.0–0.1)
Basophils Relative: 0 %
Eosinophils Absolute: 0.1 10*3/uL (ref 0.0–0.5)
Eosinophils Relative: 1 %
HCT: 32.1 % — ABNORMAL LOW (ref 39.0–52.0)
Hemoglobin: 11.3 g/dL — ABNORMAL LOW (ref 13.0–17.0)
Immature Granulocytes: 1 %
Lymphocytes Relative: 20 %
Lymphs Abs: 2.3 10*3/uL (ref 0.7–4.0)
MCH: 33.4 pg (ref 26.0–34.0)
MCHC: 35.2 g/dL (ref 30.0–36.0)
MCV: 95 fL (ref 80.0–100.0)
Monocytes Absolute: 1.5 10*3/uL — ABNORMAL HIGH (ref 0.1–1.0)
Monocytes Relative: 14 %
Neutro Abs: 7.1 10*3/uL (ref 1.7–7.7)
Neutrophils Relative %: 64 %
Platelets: 184 10*3/uL (ref 150–400)
RBC: 3.38 MIL/uL — ABNORMAL LOW (ref 4.22–5.81)
RDW: 12.8 % (ref 11.5–15.5)
WBC: 11.1 10*3/uL — ABNORMAL HIGH (ref 4.0–10.5)
nRBC: 0 % (ref 0.0–0.2)

## 2020-12-29 LAB — CBC
HCT: 33.7 % — ABNORMAL LOW (ref 39.0–52.0)
Hemoglobin: 11.4 g/dL — ABNORMAL LOW (ref 13.0–17.0)
MCH: 32.3 pg (ref 26.0–34.0)
MCHC: 33.8 g/dL (ref 30.0–36.0)
MCV: 95.5 fL (ref 80.0–100.0)
Platelets: 235 10*3/uL (ref 150–400)
RBC: 3.53 MIL/uL — ABNORMAL LOW (ref 4.22–5.81)
RDW: 12.9 % (ref 11.5–15.5)
WBC: 14.7 10*3/uL — ABNORMAL HIGH (ref 4.0–10.5)
nRBC: 0 % (ref 0.0–0.2)

## 2020-12-29 LAB — CORTISOL: Cortisol, Plasma: 5 ug/dL

## 2020-12-29 LAB — GLUCOSE, CAPILLARY
Glucose-Capillary: 112 mg/dL — ABNORMAL HIGH (ref 70–99)
Glucose-Capillary: 118 mg/dL — ABNORMAL HIGH (ref 70–99)
Glucose-Capillary: 210 mg/dL — ABNORMAL HIGH (ref 70–99)
Glucose-Capillary: 74 mg/dL (ref 70–99)
Glucose-Capillary: 87 mg/dL (ref 70–99)
Glucose-Capillary: 92 mg/dL (ref 70–99)

## 2020-12-29 LAB — LACTIC ACID, PLASMA
Lactic Acid, Venous: 1 mmol/L (ref 0.5–1.9)
Lactic Acid, Venous: 1.2 mmol/L (ref 0.5–1.9)

## 2020-12-29 LAB — MRSA NEXT GEN BY PCR, NASAL: MRSA by PCR Next Gen: NOT DETECTED

## 2020-12-29 LAB — OSMOLALITY, URINE: Osmolality, Ur: 541 mOsm/kg (ref 300–900)

## 2020-12-29 LAB — TSH: TSH: 1.033 u[IU]/mL (ref 0.350–4.500)

## 2020-12-29 LAB — OSMOLALITY: Osmolality: 299 mOsm/kg — ABNORMAL HIGH (ref 275–295)

## 2020-12-29 MED ORDER — CALCIUM GLUCONATE-NACL 1-0.675 GM/50ML-% IV SOLN
1.0000 g | Freq: Once | INTRAVENOUS | Status: AC
  Administered 2020-12-29: 1000 mg via INTRAVENOUS
  Filled 2020-12-29: qty 50

## 2020-12-29 MED ORDER — NOREPINEPHRINE 4 MG/250ML-% IV SOLN: 0.0000 ug/min | INTRAVENOUS | Status: DC

## 2020-12-29 MED ORDER — PIPERACILLIN-TAZOBACTAM 3.375 G IVPB
3.3750 g | Freq: Once | INTRAVENOUS | Status: AC
  Administered 2020-12-29: 3.375 g via INTRAVENOUS
  Filled 2020-12-29: qty 50

## 2020-12-29 MED ORDER — NOREPINEPHRINE 4 MG/250ML-% IV SOLN
2.0000 ug/min | INTRAVENOUS | Status: DC
Start: 2020-12-29 — End: 2021-01-01
  Administered 2020-12-29 (×2): 9 ug/min via INTRAVENOUS
  Administered 2020-12-29: 2 ug/min via INTRAVENOUS
  Administered 2020-12-31: 3 ug/min via INTRAVENOUS
  Filled 2020-12-29 (×5): qty 250

## 2020-12-29 MED ORDER — LACTATED RINGERS IV BOLUS
1000.0000 mL | Freq: Once | INTRAVENOUS | Status: AC
  Administered 2020-12-29: 1000 mL via INTRAVENOUS

## 2020-12-29 MED ORDER — LIDOCAINE HCL (PF) 1 % IJ SOLN
INTRAMUSCULAR | Status: AC
  Filled 2020-12-29: qty 5

## 2020-12-29 MED ORDER — LIDOCAINE HCL (PF) 1 % IJ SOLN
INTRAMUSCULAR | Status: AC
  Filled 2020-12-29: qty 10

## 2020-12-29 MED ORDER — INSULIN ASPART 100 UNIT/ML IV SOLN
5.0000 [IU] | Freq: Once | INTRAVENOUS | Status: AC
  Administered 2020-12-29: 5 [IU] via INTRAVENOUS

## 2020-12-29 MED ORDER — SODIUM ZIRCONIUM CYCLOSILICATE 10 G PO PACK
10.0000 g | PACK | Freq: Once | ORAL | Status: AC
  Administered 2020-12-29: 10 g via ORAL
  Filled 2020-12-29: qty 1

## 2020-12-29 MED ORDER — DEXTROSE 50 % IV SOLN
1.0000 | Freq: Once | INTRAVENOUS | Status: AC
  Administered 2020-12-29: 50 mL via INTRAVENOUS
  Filled 2020-12-29: qty 50

## 2020-12-29 MED ORDER — SODIUM CHLORIDE 0.9 % IV SOLN
250.0000 mL | INTRAVENOUS | Status: DC
  Administered 2020-12-30: 250 mL via INTRAVENOUS

## 2020-12-29 MED ORDER — SODIUM CHLORIDE 0.45 % IV SOLN
INTRAVENOUS | Status: DC
  Filled 2020-12-29 (×6): qty 75

## 2020-12-29 MED ORDER — VASOPRESSIN 20 UNITS/100 ML INFUSION FOR SHOCK
0.0000 [IU]/min | INTRAVENOUS | Status: DC
Start: 2020-12-29 — End: 2021-01-01
  Filled 2020-12-29 (×2): qty 100

## 2020-12-29 MED ORDER — IOHEXOL 9 MG/ML PO SOLN
500.0000 mL | ORAL | Status: AC
  Administered 2020-12-29 (×2): 500 mL via ORAL

## 2020-12-29 MED ORDER — PIPERACILLIN-TAZOBACTAM 3.375 G IVPB
3.3750 g | Freq: Three times a day (TID) | INTRAVENOUS | Status: DC
  Administered 2020-12-29 – 2021-01-01 (×9): 3.375 g via INTRAVENOUS
  Filled 2020-12-29 (×9): qty 50

## 2020-12-29 MED ORDER — INFLUENZA VAC SPLIT QUAD 0.5 ML IM SUSY
0.5000 mL | PREFILLED_SYRINGE | INTRAMUSCULAR | Status: AC
  Administered 2020-12-31: 0.5 mL via INTRAMUSCULAR
  Filled 2020-12-29: qty 0.5

## 2020-12-29 NOTE — Progress Notes (Signed)
[facility]  Tolerating the water soluble contrast, no abdominal pain. Levophed at 63mcg. MAP 72.   Soft, mildly distended, minimally tender  Will get CT around 1245 per RN report.  Repeat BMP at 1230.  Curlene Labrum, MD [facility] [address] [place], [place] [address] [phone] (office)

## 2020-12-29 NOTE — Progress Notes (Signed)
Patient's blood pressure remains low with MAP's below 60 still on 42mcg of levophed peripherally. Adefeso MD made aware. No further orders at this time.

## 2020-12-29 NOTE — Consult Note (Signed)
Referring Provider: No ref. provider found Primary Care Physician:  [facility] Primary Gastroenterologist:  Dr. Benson Norway  Reason for Consultation: Abdominal pain, dysphagia, pneumatosis intestinalis  HPI: Patient is a 60 year old gentleman with medical multiple medical problems including cardiomyopathy with HFrEF GERD hypertension, diabetes, OSA/CPAP therapy who was in his usual state of health until about a month ago when he was initiated on Ozempic.  He took 3 injections.  A couple of days after taking the first injection he started to feel bad developed some anorexia and upper abdominal discomfort and a sensation that it was hard to get food down after swallowing.  Did not have any nausea or vomiting.  Noted his stools were very dark for several days. Patient continued to have significant upper abdominal pain.  He presented to the emergency department for evaluation yesterday; was found to have acute kidney injury, metabolic acidosis, hyponatremia and hypotension overnight.   CT demonstrated pneumatosis intestinalis without an obvious cause.  Stomach appeared normal; he has diverticulosis without evidence of inflammation. Overnight he has been treated with IV hydration.  Hyperkalemia has been treated. Low-dose Levophed added overnight for soft blood pressures. Lactic acid has remained normal.  Repeat non IV contrast contrast CT today revealed stable findings of pneumatosis intestinalis involving pretty much all the small intestine with no new findings.. White count on admission 15 5; today 14.7 : H&H stable in the 11 range. Serial lactic acid levels have all been within normal limits.  Serum lipase mildly elevated at 67; LFTs normal Patient remains n.p.o. and is being closely monitored in the ICU.  Past medical history significant for cholecystectomy postoperatively required ERCP for what sounds like a CBD duct stone; he had a stent placed and it was subsequently removed.   History of adenomatous colon polyps removed previously At baseline, he denies any issues with reflux odynophagia or dysphagia.  No history of peptic ulcer disease.   Past Medical History:  Diagnosis Date   Arthritis    gout   Cardiomyopathy    EF 30-40% (Normal coronaries cath 2010).  Echo 2015 EF 40%   CHF (congestive heart failure) (HCC)    Colonic polyp    Diverticulosis of colon    GERD (gastroesophageal reflux disease)    History of chicken pox    HLD (hyperlipidemia)    recent   HTN (hypertension)    recent   Sleep apnea    does use CPAP.     Thyroid disease     Past Surgical History:  Procedure Laterality Date   BILIARY STENT PLACEMENT  11/08/2017   Procedure: BILIARY STENT PLACEMENT;  Surgeon: Carol Ada, MD;  Location: [facility] ENDOSCOPY;  Service: Endoscopy;;   BIOPSY  03/08/2018   Procedure: BIOPSY;  Surgeon: Doran Stabler, MD;  Location: [facility] ENDOSCOPY;  Service: Gastroenterology;;   CARDIAC CATHETERIZATION  2010   CHOLECYSTECTOMY N/A 09/07/2017   Procedure: LAPAROSCOPIC CHOLECYSTECTOMY;  Surgeon: Mickeal Skinner, MD;  Location: [place];  Service: General;  Laterality: N/A;  LAPAROSCOPIC CHOLECYSTECTOMY   COLONOSCOPY WITH PROPOFOL N/A 03/08/2018   Procedure: COLONOSCOPY WITH PROPOFOL;  Surgeon: Doran Stabler, MD;  Location: [facility] ENDOSCOPY;  Service: Gastroenterology;  Laterality: N/A;   ERCP N/A 09/11/2017   Procedure: ENDOSCOPIC RETROGRADE CHOLANGIOPANCREATOGRAPHY (ERCP);  Surgeon: Doran Stabler, MD;  Location: [place];  Service: Gastroenterology;  Laterality: N/A;   ERCP N/A 11/08/2017   Procedure: ENDOSCOPIC RETROGRADE CHOLANGIOPANCREATOGRAPHY (ERCP);  Surgeon: Carol Ada, MD;  Location: [facility] ENDOSCOPY;  Service: Endoscopy;  Laterality: N/A;   ERCP N/A 01/11/2018   Procedure: ENDOSCOPIC RETROGRADE CHOLANGIOPANCREATOGRAPHY;  Surgeon: Doran Stabler, MD;  Location: [facility] ENDOSCOPY;  Service: Gastroenterology;  Laterality: N/A;  With Balloon Sweep of Ducts    REMOVAL OF STONES  11/08/2017   Procedure: REMOVAL OF STONES;  Surgeon: Carol Ada, MD;  Location: [place];  Service: Endoscopy;;   RIGHT/LEFT HEART CATH AND CORONARY ANGIOGRAPHY N/A 10/12/2017   Procedure: RIGHT/LEFT HEART CATH AND CORONARY ANGIOGRAPHY;  Surgeon: Larey Dresser, MD;  Location: [place] CV LAB;  Service: Cardiovascular;  Laterality: N/A;   SPHINCTEROTOMY  11/08/2017   Procedure: SPHINCTEROTOMY;  Surgeon: Carol Ada, MD;  Location: [facility] ENDOSCOPY;  Service: Endoscopy;;   STENT REMOVAL  01/11/2018   Procedure: STENT REMOVAL;  Surgeon: Doran Stabler, MD;  Location: [facility] ENDOSCOPY;  Service: Gastroenterology;;   TONSILLECTOMY AND ADENOIDECTOMY      Prior to Admission medications   Medication Sig Start Date End Date Taking? Authorizing Provider  allopurinol (ZYLOPRIM) 100 MG tablet Take 100 mg by mouth 2 (two) times daily.    Yes [provider]  carvedilol (COREG) 25 MG tablet TAKE (1) TABLET BY MOUTH TWICE DAILY WITH A MEAL. 12/14/20  Yes Larey Dresser, MD  Famotidine (PEPCID PO) Take by mouth daily.   Yes [provider]  FARXIGA 10 MG TABS tablet TAKE 1 TABLET BEFORE BREAKFAST. 12/04/20  Yes Larey Dresser, MD  furosemide (LASIX) 20 MG tablet Take 1 tablet (20 mg total) by mouth as needed for fluid or edema. 11/30/17  Yes Larey Dresser, MD  levothyroxine (SYNTHROID) 50 MCG tablet Take 50 mcg by mouth daily. 12/25/20  Yes [provider]  predniSONE (DELTASONE) 10 MG tablet Take by mouth See admin instructions. Today (12/28/2020) was the first day of taper to 4 tablets per day for 3 days, then 3 tablets for 3 days, then 2 tablets for 3 days, then 1 tablet for 3 days 12/24/20  Yes [provider]  rosuvastatin (CRESTOR) 10 MG tablet Take 1 tablet (10 mg total) by mouth daily. 11/20/20 11/20/21 Yes Larey Dresser, MD  sacubitril-valsartan (ENTRESTO) 97-103 MG Take 1 tablet by mouth 2 (two) times daily. 08/08/20  Yes Larey Dresser,  MD  spironolactone (ALDACTONE) 25 MG tablet TAKE 1 TABLET BY MOUTH DAILY. 12/20/20  Yes Larey Dresser, MD    Current Facility-Administered Medications  Medication Dose Route Frequency Provider Last Rate Last Admin   0.9 %  sodium chloride infusion  250 mL Intravenous Continuous Adefeso, Oladapo, DO   Held at 12/29/20 0143   acetaminophen (TYLENOL) tablet 650 mg  650 mg Oral Q6H PRN Truett Mainland, DO       Or   acetaminophen (TYLENOL) suppository 650 mg  650 mg Rectal Q6H PRN Truett Mainland, DO       Chlorhexidine Gluconate Cloth 2 % PADS 6 each  6 each Topical Q0600 Truett Mainland, DO   6 each at 12/29/20 1601   insulin aspart (novoLOG) injection 0-15 Units  0-15 Units Subcutaneous Q4H Loma Boston J, DO       lactated ringers infusion   Intravenous Continuous Truett Mainland, DO 10 mL/hr at 12/29/20 1018 Infusion Verify at 12/29/20 1018   [START ON 12/31/2020] levothyroxine (SYNTHROID, LEVOTHROID) injection 25 mcg  25 mcg Intravenous Daily Truett Mainland, DO       norepinephrine (LEVOPHED) 4mg  in 275mL premix infusion  2-10 mcg/min Intravenous Titrated Adefeso, Oladapo, DO 7.5 mL/hr at 12/29/20 1508 2 mcg/min at 12/29/20 1508   ondansetron (ZOFRAN) tablet 4 mg  4 mg Oral Q6H PRN Truett Mainland, DO       Or   ondansetron [facility]) injection 4 mg  4 mg Intravenous Q6H PRN Truett Mainland, DO       pantoprazole (PROTONIX) injection 40 mg  40 mg Intravenous QHS Stinson, Jacob J, DO   40 mg at 12/29/20 0015   piperacillin-tazobactam (ZOSYN) IVPB 3.375 g  3.375 g Intravenous Q8H Shah, Pratik D, DO       sodium bicarbonate 75 mEq in sodium chloride 0.45 % 1,075 mL infusion   Intravenous Continuous Virl Cagey, MD 100 mL/hr at 12/29/20 1508 Infusion Verify at 12/29/20 1508   vasopressin (PITRESSIN) 20 Units in sodium chloride 0.9 % 100 mL infusion-*FOR SHOCK*  0-0.03 Units/min Intravenous Continuous Adefeso, Oladapo, DO   Held at 12/29/20 0622    Allergies as of 12/28/2020 -  Review Complete 12/28/2020  Allergen Reaction Noted   Ozempic (0.25 or 0.5 mg-dose) [semaglutide(0.25 or 0.5mg -dos)]  12/18/2020    Family History  Problem Relation Age of Onset   Arthritis Other        family history   Diabetes Other        family history   Hyperlipidemia Other        family history   Lung cancer Other        family history   Stroke Other        family history    Social History   Socioeconomic History   Marital status: Married    Spouse name: Not on file   Number of children: 1   Years of education: Not on file   Highest education level: Not on file  Occupational History   Not on file  Tobacco Use   Smoking status: Former    Packs/day: 1.50    Years: 3.00    Pack years: 4.50    Types: Cigarettes    Quit date: 04/07/1980    Years since quitting: 40.7   Smokeless tobacco: Never  Vaping Use   Vaping Use: Never used  Substance and Sexual Activity   Alcohol use: No   Drug use: No    Comment: quit in 1999, marijuana   Sexual activity: Not on file  Other Topics Concern   Not on file  Social History Narrative   Not on file   Social Determinants of Health   Financial Resource Strain: Not on file  Food Insecurity: Not on file  Transportation Needs: Not on file  Physical Activity: Not on file  Stress: Not on file  Social Connections: Not on file  Intimate Partner Violence: Not on file    Review of Systems: As in history of present illness.   Physical Exam: Vital signs in last 24 hours: Temp:  [97.5 F (36.4 C)-98.3 F (36.8 C)] 97.5 F (36.4 C) (09/24 1100) Pulse Rate:  [52-74] 59 (09/24 1500) Resp:  [13-26] 17 (09/24 1500) BP: (76-141)/(32-74) 86/41 (09/24 1500) SpO2:  [96 %-100 %] 96 % (09/24 1500) Arterial Line BP: (92-147)/(40-76) 101/40 (09/24 1500) FiO2 (%):  [21 %] 21 % (09/23 2355) Weight:  [124.7 kg] 124.7 kg (09/23 1556) Last BM Date: 12/27/20 General:   Alert,   pleasant and cooperative in NAD; seen and ICU bed 4 along  with Dr. Constance Haw Eyes:  Sclera clear, no icterus.   Conjunctiva pink.  Lungs:  Clear throughout to auscultation.   No wheezes, crackles, or rhonchi. No acute distress. Heart:  Regular rate and rhythm; no murmurs, clicks, rubs,  or gallops. Abdomen: Obese.  Positive bowel sounds.  No bruits.  Localized epigastric tenderness to palpation.  No rebound no obvious mass organomegaly intake/Output from previous day: 09/23 0701 - 09/24 0700 In: 2201.8 [I.V.:51.8; IV EGBTDVVOH:6073] Out: -  Intake/Output this shift: Total I/O In: 2576.1 [I.V.:2489.3; IV Piggyback:86.8] Out: 1500 [Urine:1500]  Lab Results: Recent Labs    12/28/20 1620 12/29/20 0548  WBC 15.5* 14.7*  HGB 13.0 11.4*  HCT 37.5* 33.7*  PLT 221 235   BMET Recent Labs    12/29/20 0548 12/29/20 0832 12/29/20 1230  NA 127* 130* 127*  K 5.8* 5.8* 5.4*  CL 108 107 104  CO2 12* 15* 16*  GLUCOSE 165* 104* 82  BUN 100* 95* 84*  CREATININE 2.61* 2.34* 2.21*  CALCIUM 8.1* 8.4* 8.5*   LFT Recent Labs    12/28/20 1620  PROT 7.6  ALBUMIN 4.1  AST 18  ALT 27  ALKPHOS 43  BILITOT 0.2*   PT/INR No results for input(s): LABPROT, INR in the last 72 hours. Hepatitis Panel No results for input(s): HEPBSAG, HCVAB, HEPAIGM, HEPBIGM in the last 72 hours. C-Diff No results for input(s): CDIFFTOX in the last 72 hours.  Studies/Results: CT ABDOMEN PELVIS WO CONTRAST  Result Date: 12/29/2020 CLINICAL DATA:  Abdominal pain, follow-up pneumatosis EXAM: CT ABDOMEN AND PELVIS WITHOUT CONTRAST TECHNIQUE: Multidetector CT imaging of the abdomen and pelvis was performed following the standard protocol without IV contrast. COMPARISON:  12/28/2020 FINDINGS: Lower chest: No acute abnormality. Hepatobiliary: No solid liver abnormality is seen. No gallstones, gallbladder wall thickening, or biliary dilatation. Pancreas: Unremarkable. No pancreatic ductal dilatation or surrounding inflammatory changes. Spleen: Normal in size without significant  abnormality. Adrenals/Urinary Tract: Adrenal glands are unremarkable. Kidneys are normal, without renal calculi, solid lesion, or hydronephrosis. Bladder is unremarkable. Stomach/Bowel: Stomach is within normal limits. There is redemonstrated, diffuse pneumatosis of the small bowel, involving almost the entirety of its length, and similar in appearance to prior examination. Oral enteric contrast has transited to the cecum. No distension or inflammatory thickening of bowel wall. Appendix appears diminutive although normal. Sigmoid diverticulosis. Vascular/Lymphatic: Aortic atherosclerosis. No enlarged abdominal or pelvic lymph nodes. Reproductive: No mass or other significant abnormality. Other: Fat containing bilateral inguinal hernias. No abdominopelvic ascites. Musculoskeletal: No acute or significant osseous findings. IMPRESSION: 1. There is redemonstrated, diffuse pneumatosis of the small bowel, involving almost the entirety of its length, and similar in appearance to prior examination. Oral enteric contrast has transited to the cecum, and there is no distention or inflammatory thickening of bowel wall. Findings favor benign pneumatosis intestinalis. 2.  Sigmoid diverticulosis. Aortic Atherosclerosis (ICD10-I70.0). Electronically Signed   By: Eddie Candle M.D.   On: 12/29/2020 13:20   CT ABDOMEN PELVIS WO CONTRAST  Result Date: 12/28/2020 CLINICAL DATA:  Epigastric abdominal pain for 1 month. EXAM: CT ABDOMEN AND PELVIS WITHOUT CONTRAST TECHNIQUE: Multidetector CT imaging of the abdomen and pelvis was performed following the standard protocol without IV contrast. COMPARISON:  November 06, 2017. FINDINGS: Lower chest: No acute abnormality. Hepatobiliary: No focal liver abnormality is seen. Status post cholecystectomy. No biliary dilatation. Pancreas: Unremarkable. No pancreatic ductal dilatation or surrounding inflammatory changes. Spleen: Normal in size without focal abnormality. Adrenals/Urinary Tract: Adrenal  glands are unremarkable. Kidneys are normal, without renal calculi, focal lesion, or hydronephrosis. Bladder is unremarkable. Stomach/Bowel: Stomach is unremarkable.  The appendix is not visualized. Diverticulosis of the sigmoid colon is noted without inflammation. The colon is nondilated. However, there is interval development of pneumatosis involving small bowel loops without inflammatory change and minimal proximal small bowel dilatation. Vascular/Lymphatic: Aortic atherosclerosis. No enlarged abdominal or pelvic lymph nodes. Reproductive: Prostate is unremarkable. Other: Bilateral fat containing inguinal hernias are again noted. No ascites is noted. Musculoskeletal: No acute or significant osseous findings. IMPRESSION: Interval development of extensive pneumatosis involving small bowel loops without definite inflammatory change. Minimal proximal small bowel dilatation is noted. This may simply represent benign pneumatosis, but ischemic bowel can not be excluded although felt to be unlikely. Clinical correlation is recommended. Differential diagnosis for benign pneumatosis can include pulmonary disease, intestinal inflammation iatrogenic due to procedures or certain medications. Critical Value/emergent results were called by telephone at the time of interpretation on 12/28/2020 at 7:21 pm to provider [facility] ZAMMIT , who verbally acknowledged these results. Electronically Signed   By: Marijo Conception M.D.   On: 12/28/2020 19:21   DG Chest Port 1 View  Result Date: 12/29/2020 CLINICAL DATA:  60 year old male central line placement. EXAM: PORTABLE CHEST 1 VIEW COMPARISON:  12/24/2020 chest radiographs. FINDINGS: Portable AP upright view at 0752 hours. Left IJ approach central line in place, tip at the level of the lower SVC, about 1 vertebral body height below the carina. No pneumothorax. Mediastinal contours remain normal. Lung ventilation is stable and within normal limits. No acute osseous abnormality  identified. Negative visible bowel gas. IMPRESSION: Left IJ approach central line placed with tip at the lower SVC level. No pneumothorax or adverse features. Electronically Signed   By: Genevie Ann M.D.   On: 12/29/2020 08:19     Impression: Very interesting presentation in this 60 year old gentleman with multiple medical problems including diabetes, cardiomyopathy with associated heart failure/reduced ejection fraction, OSA with CPAP characterized by nearly 1 month history of malaise, anorexia, weight loss ,gnawing upper abdominal discomfort with perceived dysphagia and some odynophagia to solids and liquids without nausea or vomiting. Patient found to have extensive pneumatosis intestinalis, acute kidney injury, acidosis, hypotension. No evidence of perforation or other cause of pneumatosis on cross-sectional imaging.  I am struck by the temporal relationship of taking his new medication, Ozempic, and the onset of his symptoms.  I cannot rule out a recent episode of mesenteric ischemia in the setting of a low flow state superimposed on a potential fixed mesenteric stenosis.  In this setting, mildly elevated lipase is nonspecific.  He does not have pancreatitis.  At this time, patient does not appear to have peritonitis or impending bowel infarction.  In fact, he looked pretty good when I came to see him today.  History of dark stools but certainly no overt bleeding thus far during this hospitalization.-Hemoglobin stable.  I have discussed with Dr. Blake Divine at length in the ICU.  Recommendations:  Agree with n.p.o., antibiotics and PPI and withholding CPAP in an abundance of caution.  Watch closely in the ICU.  Would avoid Ozempic moving forward  Would pursue a CTA of the abdomen when feasible to evaluate for  mesenteric stenosis.  He may or may not benefit from an EGD at some point but not needed now.  I will continue to follow along with you while he is  hospitalized.         Notice:  This dictation was prepared with Dragon dictation along with smaller phrase technology. Any transcriptional errors that result from this process are unintentional  and may not be corrected upon review.

## 2020-12-29 NOTE — Progress Notes (Signed)
[facility]  CT reviewed, continue pneumatosis of the small bowel but no free air, no fluid, no inflammatory changes, no wall thickening, no signs to suggest ischemia.  BMP with improved K and Cr and bicarb.    BP (!) 116/53   Pulse 62   Temp (!) 97.5 F (36.4 C) (Oral)   Resp (!) 26   Ht 5\' 10"  (1.778 m)   Wt 124.7 kg   SpO2 98%   BMI 39.46 kg/m   Remains stable with Only soreness complaints On 1-92mcg levophed at times  Discussed with Dr.Jenkins, Dr. Gala Romney, updated Anesthesia.  Right now things are improving, no signs of ischemia, favoring benign pneumatosis.   Plan for NPO IVF, will repeat Bmp at 1800 to ensure continued improvement and assess if HCO3 gtt needs to stop Will repeat CBC at 1800 too  If exam worsens, vitals worsen or labs worsen will need to explore.  Patient and wife updated at bedside. Dr.Shah updated.   Curlene Labrum, MD [facility] [address] [place], [place] [address] [phone] (office)

## 2020-12-29 NOTE — Progress Notes (Addendum)
RN called due to patient being hypotensive since admission with MAP ranging within 47-61 despite IV hydration Patient will be temporarily started on Levophed via PIV so as to maintain a MAP of 65 and above.  Continue telemetry and continue to monitor patient closely.  Patient may need central line if he  will require IV pressor for a longer time.    12/29/20 -3 AM RN called due to patient potassium at 6.1.  EKG was ordered, insulin 5 units and D50 and Lokelma were ordered. Continue to monitor BMP as well as CBG.  12/29/2020-3:56 AM Nurse called due to patient's MAP still being less than 60.  Vasopressin will be started. Consider consult for central line in the morning if patient continues to require pressors to maintain normal BP

## 2020-12-29 NOTE — Procedures (Signed)
Procedure Note  12/29/20    Preoperative Diagnosis: Hypotension, requiring pressors, pneumatosis intestinalis, acute kidney injury    Postoperative Diagnosis: Same   Procedure(s) Performed: Central Line placement, left jugular and arterial line, left radial    Surgeon: Lanell Matar. Constance Haw, MD   Assistants: None   Anesthesia: 1% lidocaine    Complications: None    Indications: [name] is a 60 y.o. with hypothyroidism, obesity, cardiomegaly, CHF EF 45% who has been getting ozempic for weight loss and having epigastric/ chest pain and difficulty eating. He also no appetite. He has an acute kidney injury and bicarbonate that is low, normal lactic acidosis  . I discussed the risk and benefits of placement of the central line with him, including but not limited to bleeding, infection, and risk of pneumothorax. We also discussed arterial line placement and risk of bleeding, infection, injury to vessels.  He has given written consent for the procedure.    Procedure: The patient placed supine. The left chest and neck was prepped and draped in the usual sterile fashion.  Wearing full gown and gloves, I performed the procedure.  One percent lidocaine was used for local anesthesia.  The needle with syringe was advanced but never accessed the subclavian vein. I then used an ultrasound was utilized to assess the jugular vein with dark venous return, and a wire was placed using the Seldinger technique without difficulty.  Ectopia was no noted.  The skin was knicked and a dilator was placed, and the three lumen catheter was placed over the wire with continued control of the wire.  There was good draw back of blood from all three lumens and each flushed easily with saline.  The catheter was secured in 4 points with 2-0 silk and a biopatch and dressing was placed.     The patient tolerated the procedure well, and the CXR was ordered to confirm position of the central line.   The left wrist was prepped and  draped in the normal sterile fashion. 1% lidocaine was injected. The arterial 2 arrow was inserted left radial artery and a dressing was placed with biopatch. No hematoma or changes in periperhal flow noted.   The patient tolerated the procedure well.   Curlene Labrum, MD [facility] [address] [place], [place] [address] [phone] (office)

## 2020-12-29 NOTE — Progress Notes (Signed)
Labs improving; leukocytosis down, bicarb improved, creatinine down, k down.   Curlene Labrum, MD

## 2020-12-29 NOTE — Consult Note (Signed)
[place]  Reason for Consult: Pneumatosis intestinalis  Referring Physician: Dr.Stinson / Dr. Manuella Ghazi   Chief Complaint   Abdominal Pain     HPI: Nicholas Gray is a 60 y.o. male with cardiomyopathy, recent EF 45%, CPAP use, hypothyroidism, recent injections of ozempic for weight loss and has been losing about 20 lbs but has been suffering with inability to eat and having pain in the lower esophagus/ epigastric region and feeling like food is getting stuck in the area. He said he did not take the fourth dose. He reported that he also has had dark black tarry diarrhea for a few weeks now.  He also reports having pain in the mid upper back with raising his arms above his head and his PCP is worried about his C spine, no imaging to date.  ECHO recently done that was improved EF.  He was seen in the ED and Gi was initially consulted, Dr. Nehemiah Settle was doing his admission and called me about him late last night to discuss. He had a normal lactic but did have some abdominal pain. His AKI with hyperkalemia required resuscitation, and given the potential that this could be benign versus something would need exploration, we opted for admission, antibiotics, IVF resuscitation, and monitoring.    Early this AM I checked in with the RN and she reported he was on a low dose of levophed, so I came into further evaluate the situation. The patient says he has some soreness but nothing extreme. He is cracking jokes and in good spirits overall. I placed CVL and arterial line at bedside without issues. I also broadened the antibiotics to zosyn and started bicarbonate given the acidosis with the acute kidney injury, and continued normal lactic.   Past Medical History:  Diagnosis Date   Arthritis    gout   Cardiomyopathy    EF 30-40% (Normal coronaries cath 2010).  Echo 2015 EF 40%   CHF (congestive heart failure) (HCC)    Colonic polyp    Diverticulosis of colon    GERD  (gastroesophageal reflux disease)    History of chicken pox    HLD (hyperlipidemia)    recent   HTN (hypertension)    recent   Sleep apnea    does use CPAP.     Thyroid disease     Past Surgical History:  Procedure Laterality Date   BILIARY STENT PLACEMENT  11/08/2017   Procedure: BILIARY STENT PLACEMENT;  Surgeon: Carol Ada, MD;  Location: [facility] ENDOSCOPY;  Service: Endoscopy;;   BIOPSY  03/08/2018   Procedure: BIOPSY;  Surgeon: Doran Stabler, MD;  Location: [facility];  Service: Gastroenterology;;   CARDIAC CATHETERIZATION  2010   CHOLECYSTECTOMY N/A 09/07/2017   Procedure: LAPAROSCOPIC CHOLECYSTECTOMY;  Surgeon: Mickeal Skinner, MD;  Location: [place];  Service: General;  Laterality: N/A;  LAPAROSCOPIC CHOLECYSTECTOMY   COLONOSCOPY WITH PROPOFOL N/A 03/08/2018   Procedure: COLONOSCOPY WITH PROPOFOL;  Surgeon: Doran Stabler, MD;  Location: [facility] ENDOSCOPY;  Service: Gastroenterology;  Laterality: N/A;   ERCP N/A 09/11/2017   Procedure: ENDOSCOPIC RETROGRADE CHOLANGIOPANCREATOGRAPHY (ERCP);  Surgeon: Doran Stabler, MD;  Location: [place];  Service: Gastroenterology;  Laterality: N/A;   ERCP N/A 11/08/2017   Procedure: ENDOSCOPIC RETROGRADE CHOLANGIOPANCREATOGRAPHY (ERCP);  Surgeon: Carol Ada, MD;  Location: [place];  Service: Endoscopy;  Laterality: N/A;   ERCP N/A 01/11/2018   Procedure: ENDOSCOPIC RETROGRADE CHOLANGIOPANCREATOGRAPHY;  Surgeon: Doran Stabler, MD;  Location: [facility]  ENDOSCOPY;  Service: Gastroenterology;  Laterality: N/A;  With Balloon Sweep of Ducts   REMOVAL OF STONES  11/08/2017   Procedure: REMOVAL OF STONES;  Surgeon: Carol Ada, MD;  Location: [place];  Service: Endoscopy;;   RIGHT/LEFT HEART CATH AND CORONARY ANGIOGRAPHY N/A 10/12/2017   Procedure: RIGHT/LEFT HEART CATH AND CORONARY ANGIOGRAPHY;  Surgeon: Larey [facility]er, MD;  Location: [place] CV LAB;  Service: Cardiovascular;  Laterality: N/A;   SPHINCTEROTOMY  11/08/2017    Procedure: SPHINCTEROTOMY;  Surgeon: Carol Ada, MD;  Location: [facility] ENDOSCOPY;  Service: Endoscopy;;   STENT REMOVAL  01/11/2018   Procedure: STENT REMOVAL;  Surgeon: Doran Stabler, MD;  Location: [facility] ENDOSCOPY;  Service: Gastroenterology;;   TONSILLECTOMY AND ADENOIDECTOMY      Family History  Problem Relation Age of Onset   Arthritis Other        family history   Diabetes Other        family history   Hyperlipidemia Other        family history   Lung cancer Other        family history   Stroke Other        family history    Social History   Tobacco Use   Smoking status: Former    Packs/day: 1.50    Years: 3.00    Pack years: 4.50    Types: Cigarettes    Quit date: 04/07/1980    Years since quitting: 40.7   Smokeless tobacco: Never  Vaping Use   Vaping Use: Never used  Substance Use Topics   Alcohol use: No   Drug use: No    Comment: quit in 1999, marijuana    Medications: I have reviewed the patient's current medications. Prior to Admission:  Medications Prior to Admission  Medication Sig Dispense Refill Last Dose   allopurinol (ZYLOPRIM) 100 MG tablet Take 100 mg by mouth 2 (two) times daily.    12/28/2020   carvedilol (COREG) 25 MG tablet TAKE (1) TABLET BY MOUTH TWICE DAILY WITH A MEAL. 60 tablet 11 12/28/2020   Famotidine (PEPCID PO) Take by mouth daily.   12/28/2020   FARXIGA 10 MG TABS tablet TAKE 1 TABLET BEFORE BREAKFAST. 30 tablet 11 12/28/2020   furosemide (LASIX) 20 MG tablet Take 1 tablet (20 mg total) by mouth as needed for fluid or edema. 30 tablet 3 unk   levothyroxine (SYNTHROID) 50 MCG tablet Take 50 mcg by mouth daily.   12/28/2020   predniSONE (DELTASONE) 10 MG tablet Take by mouth See admin instructions. Today (12/28/2020) was the first day of taper to 4 tablets per day for 3 days, then 3 tablets for 3 days, then 2 tablets for 3 days, then 1 tablet for 3 days   12/28/2020   rosuvastatin (CRESTOR) 10 MG tablet Take 1 tablet (10 mg total) by mouth  daily. 30 tablet 11 12/27/2020   sacubitril-valsartan (ENTRESTO) 97-103 MG Take 1 tablet by mouth 2 (two) times daily. 180 tablet 3 12/28/2020   spironolactone (ALDACTONE) 25 MG tablet TAKE 1 TABLET BY MOUTH DAILY. 30 tablet 6 12/27/2020   Scheduled:  Chlorhexidine Gluconate Cloth  6 each Topical Q0600   insulin aspart  0-15 Units Subcutaneous Q4H   [START ON 12/31/2020] levothyroxine  25 mcg Intravenous Daily   pantoprazole (PROTONIX) IV  40 mg Intravenous QHS   Continuous:  sodium chloride Stopped (12/29/20 0143)   calcium gluconate 50 mL/hr at 12/29/20 0927   lactated ringers 500 mL/hr at  12/29/20 0927   norepinephrine (LEVOPHED) Adult infusion 3 mcg/min (12/29/20 0927)   piperacillin-tazobactam (ZOSYN)  IV     sodium bicarbonate in 0.45 NS mL infusion     vasopressin Stopped (12/29/20 0622)   FUX:NATFTDDUKGURK **OR** acetaminophen, ondansetron **OR** ondansetron (ZOFRAN) IV  Allergies  Allergen Reactions   Ozempic (0.25 Or 0.5 Mg-Dose) [Semaglutide(0.25 Or 0.5mg -Dos)]     GI upset on 0.25mg  dose     ROS:  A comprehensive review of systems was negative except for: Gastrointestinal: positive for abdominal pain, diarrhea, odynophagia, and black tarry stools/ diarrhea   Blood pressure 126/60, pulse 64, temperature 98.3 F (36.8 C), temperature source Oral, resp. rate 19, height 5\' 10"  (1.778 m), weight 124.7 kg, SpO2 97 %. Physical Exam Vitals reviewed.  Constitutional:      Appearance: He is obese.  HENT:     Head: Normocephalic.  Eyes:     Extraocular Movements: Extraocular movements intact.  Cardiovascular:     Rate and Rhythm: Normal rate and regular rhythm.  Pulmonary:     Effort: Pulmonary effort is normal.  Abdominal:     General: There is no distension.     Palpations: Abdomen is soft.     Tenderness: There is abdominal tenderness in the epigastric area. There is no guarding or rebound.  Skin:    General: Skin is warm and dry.  Neurological:     General: No  focal deficit present.     Mental Status: He is alert and oriented to person, place, and time.  Psychiatric:        Mood and Affect: Mood normal.        Behavior: Behavior normal.    Results: Results for orders placed or performed during the hospital encounter of 12/28/20 (from the past 48 hour(s))  Lipase, blood     Status: Abnormal   Collection Time: 12/28/20  4:20 PM  Result Value Ref Range   Lipase 67 (H) 11 - 51 U/L    Comment: Performed at [facility], [address], [place], [place] [address]  Comprehensive metabolic panel     Status: Abnormal   Collection Time: 12/28/20  4:20 PM  Result Value Ref Range   Sodium 126 (L) 135 - 145 mmol/L   Potassium 5.9 (H) 3.5 - 5.1 mmol/L   Chloride 104 98 - 111 mmol/L   CO2 12 (L) 22 - 32 mmol/L   Glucose, Bld 119 (H) 70 - 99 mg/dL    Comment: Glucose reference range applies only to samples taken after fasting for at least 8 hours.   BUN 124 (H) 8 - 23 mg/dL    Comment: RESULTS CONFIRMED BY MANUAL DILUTION   Creatinine, Ser 3.34 (H) 0.61 - 1.24 mg/dL   Calcium 8.3 (L) 8.9 - 10.3 mg/dL   Total Protein 7.6 6.5 - 8.1 g/dL   Albumin 4.1 3.5 - 5.0 g/dL   AST 18 15 - 41 U/L   ALT 27 0 - 44 U/L   Alkaline Phosphatase 43 38 - 126 U/L   Total Bilirubin 0.2 (L) 0.3 - 1.2 mg/dL   GFR, Estimated 20 (L) >60 mL/min    Comment: (NOTE) Calculated using the CKD-EPI Creatinine Equation (2021)    Anion gap 10 5 - 15    Comment: Performed at [facility], [address], [place],  [address]  CBC     Status: Abnormal   Collection Time: 12/28/20  4:20 PM  Result Value Ref Range   WBC 15.5 (H)  4.0 - 10.5 K/uL   RBC 3.94 (L) 4.22 - 5.81 MIL/uL   Hemoglobin 13.0 13.0 - 17.0 g/dL   HCT 37.5 (L) 39.0 - 52.0 %   MCV 95.2 80.0 - 100.0 fL   MCH 33.0 26.0 - 34.0 pg   MCHC 34.7 30.0 - 36.0 g/dL   RDW 12.8 11.5 - 15.5 %   Platelets 221 150 - 400 K/uL   nRBC 0.0 0.0 - 0.2 %    Comment: Performed at [facility], [address], [place], [place]  [address]  Lactic acid, plasma     Status: None   Collection Time: 12/28/20  8:36 PM  Result Value Ref Range   Lactic Acid, Venous 0.9 0.5 - 1.9 mmol/L    Comment: Performed at [facility], [address], [place], [place] [address]  Resp Panel by RT-PCR (Flu A&B, Covid) Nasopharyngeal Swab     Status: None   Collection Time: 12/28/20  9:39 PM   Specimen: Nasopharyngeal Swab; Nasopharyngeal(NP) swabs in vial transport medium  Result Value Ref Range   SARS Coronavirus 2 by RT PCR NEGATIVE NEGATIVE    Comment: (NOTE) SARS-CoV-2 target nucleic acids are NOT DETECTED.  The SARS-CoV-2 RNA is generally detectable in upper respiratory specimens during the acute phase of infection. The lowest concentration of SARS-CoV-2 viral copies this assay can detect is 138 copies/mL. A negative result does not preclude SARS-Cov-2 infection and should not be used as the sole basis for treatment or other patient management decisions. A negative result may occur with  improper specimen collection/handling, submission of specimen other than nasopharyngeal swab, presence of viral mutation(s) within the areas targeted by this assay, and inadequate number of viral copies(<138 copies/mL). A negative result must be combined with clinical observations, patient history, and epidemiological information. The expected result is Negative.  Fact Sheet for Patients:  EntrepreneurPulse.com.au  Fact Sheet for Healthcare Providers:  IncredibleEmployment.be  This test is no t yet approved or cleared by the [place] FDA and  has been authorized for detection and/or diagnosis of SARS-CoV-2 by FDA under an Emergency Use Authorization (EUA). This EUA will remain  in effect (meaning this test can be used) for the duration of the COVID-19 declaration under Section 564(b)(1) of the Act, 21 U.S.C.section 360bbb-3(b)(1), unless the authorization is terminated  or revoked sooner.        Influenza A by PCR NEGATIVE NEGATIVE   Influenza B by PCR NEGATIVE NEGATIVE    Comment: (NOTE) The Xpert Xpress SARS-CoV-2/FLU/RSV plus assay is intended as an aid in the diagnosis of influenza from Nasopharyngeal swab specimens and should not be used as a sole basis for treatment. Nasal washings and aspirates are unacceptable for Xpert Xpress SARS-CoV-2/FLU/RSV testing.  Fact Sheet for Patients: EntrepreneurPulse.com.au  Fact Sheet for Healthcare Providers: IncredibleEmployment.be  This test is not yet approved or cleared by the [place] FDA and has been authorized for detection and/or diagnosis of SARS-CoV-2 by FDA under an Emergency Use Authorization (EUA). This EUA will remain in effect (meaning this test can be used) for the duration of the COVID-19 declaration under Section 564(b)(1) of the Act, 21 U.S.C. section 360bbb-3(b)(1), unless the authorization is terminated or revoked.  Performed at [facility], [address] [place], [place] [address]   Urinalysis, Routine w reflex microscopic     Status: Abnormal   Collection Time: 12/28/20 10:33 PM  Result Value Ref Range   Color, Urine STRAW (A) YELLOW   APPearance CLEAR CLEAR  Specific Gravity, Urine 1.010 1.005 - 1.030   pH 5.0 5.0 - 8.0   Glucose, UA >=500 (A) NEGATIVE mg/dL   Hgb urine dipstick SMALL (A) NEGATIVE   Bilirubin Urine NEGATIVE NEGATIVE   Ketones, ur NEGATIVE NEGATIVE mg/dL   Protein, ur NEGATIVE NEGATIVE mg/dL   Nitrite NEGATIVE NEGATIVE   Leukocytes,Ua NEGATIVE NEGATIVE   RBC / HPF 0-5 0 - 5 RBC/hpf   WBC, UA 0-5 0 - 5 WBC/hpf   Bacteria, UA NONE SEEN NONE SEEN   Squamous Epithelial / LPF 0-5 0 - 5   Mucus PRESENT     Comment: Performed at [facility], [address] [place], [place] [address]  Lactic acid, plasma     Status: None   Collection Time: 12/28/20 10:34 PM  Result Value Ref Range   Lactic Acid, Venous 1.4 0.5 - 1.9 mmol/L    Comment: Performed  at [facility], [address], [place], [place] [address]  MRSA Next Gen by PCR, Nasal     Status: None   Collection Time: 12/28/20 11:59 PM   Specimen: Nasal Mucosa; Nasal Swab  Result Value Ref Range   MRSA by PCR Next Gen NOT DETECTED NOT DETECTED    Comment: (NOTE) The GeneXpert MRSA Assay (FDA approved for NASAL specimens only), is one component of a comprehensive MRSA colonization surveillance program. It is not intended to diagnose MRSA infection nor to guide or monitor treatment for MRSA infections. Test performance is not FDA approved in patients less than 29 years old. Performed at [facility], [address] [place], [place] [address]   Glucose, capillary     Status: None   Collection Time: 12/29/20 12:22 AM  Result Value Ref Range   Glucose-Capillary 92 70 - 99 mg/dL    Comment: Glucose reference range applies only to samples taken after fasting for at least 8 hours.  Basic metabolic panel     Status: Abnormal   Collection Time: 12/29/20 12:43 AM  Result Value Ref Range   Sodium 128 (L) 135 - 145 mmol/L   Potassium 6.1 (H) 3.5 - 5.1 mmol/L   Chloride 110 98 - 111 mmol/L   CO2 10 (L) 22 - 32 mmol/L   Glucose, Bld 109 (H) 70 - 99 mg/dL    Comment: Glucose reference range applies only to samples taken after fasting for at least 8 hours.   BUN 87 (H) 8 - 23 mg/dL    Comment: RESULTS CONFIRMED BY MANUAL DILUTION   Creatinine, Ser 2.62 (H) 0.61 - 1.24 mg/dL   Calcium 8.0 (L) 8.9 - 10.3 mg/dL   GFR, Estimated 27 (L) >60 mL/min    Comment: (NOTE) Calculated using the CKD-EPI Creatinine Equation (2021)    Anion gap 8 5 - 15    Comment: Performed at [facility], [address], [place],  [address]  Glucose, capillary     Status: Abnormal   Collection Time: 12/29/20  4:42 AM  Result Value Ref Range   Glucose-Capillary 210 (H) 70 - 99 mg/dL    Comment: Glucose reference range applies only to samples taken after fasting for at least 8 hours.  CBC     Status:  Abnormal   Collection Time: 12/29/20  5:48 AM  Result Value Ref Range   WBC 14.7 (H) 4.0 - 10.5 K/uL   RBC 3.53 (L) 4.22 - 5.81 MIL/uL   Hemoglobin 11.4 (L) 13.0 - 17.0 g/dL   HCT 33.7 (L) 39.0 - 52.0 %  MCV 95.5 80.0 - 100.0 fL   MCH 32.3 26.0 - 34.0 pg   MCHC 33.8 30.0 - 36.0 g/dL   RDW 12.9 11.5 - 15.5 %   Platelets 235 150 - 400 K/uL   nRBC 0.0 0.0 - 0.2 %    Comment: Performed at [facility], [address] [place], [place] [address]  Basic metabolic panel     Status: Abnormal   Collection Time: 12/29/20  5:48 AM  Result Value Ref Range   Sodium 127 (L) 135 - 145 mmol/L   Potassium 5.8 (H) 3.5 - 5.1 mmol/L   Chloride 108 98 - 111 mmol/L   CO2 12 (L) 22 - 32 mmol/L   Glucose, Bld 165 (H) 70 - 99 mg/dL    Comment: Glucose reference range applies only to samples taken after fasting for at least 8 hours.   BUN 100 (H) 8 - 23 mg/dL   Creatinine, Ser 2.61 (H) 0.61 - 1.24 mg/dL   Calcium 8.1 (L) 8.9 - 10.3 mg/dL   GFR, Estimated 27 (L) >60 mL/min    Comment: (NOTE) Calculated using the CKD-EPI Creatinine Equation (2021)    Anion gap 7 5 - 15    Comment: Performed at [facility], [address], [place], [place] [address]  Lactic acid, plasma     Status: None   Collection Time: 12/29/20  5:48 AM  Result Value Ref Range   Lactic Acid, Venous 1.0 0.5 - 1.9 mmol/L    Comment: Performed at [facility], [address] [place], [place] [address]  Glucose, capillary     Status: Abnormal   Collection Time: 12/29/20  7:48 AM  Result Value Ref Range   Glucose-Capillary 118 (H) 70 - 99 mg/dL    Comment: Glucose reference range applies only to samples taken after fasting for at least 8 hours.  Lactic acid, plasma     Status: None   Collection Time: 12/29/20  8:32 AM  Result Value Ref Range   Lactic Acid, Venous 1.2 0.5 - 1.9 mmol/L    Comment: Performed at [facility], [address], [place], [place] [address]  Basic metabolic panel     Status: Abnormal   Collection Time:  12/29/20  8:32 AM  Result Value Ref Range   Sodium 130 (L) 135 - 145 mmol/L   Potassium 5.8 (H) 3.5 - 5.1 mmol/L   Chloride 107 98 - 111 mmol/L   CO2 15 (L) 22 - 32 mmol/L   Glucose, Bld 104 (H) 70 - 99 mg/dL    Comment: Glucose reference range applies only to samples taken after fasting for at least 8 hours.   BUN 95 (H) 8 - 23 mg/dL   Creatinine, Ser 2.34 (H) 0.61 - 1.24 mg/dL   Calcium 8.4 (L) 8.9 - 10.3 mg/dL   GFR, Estimated 31 (L) >60 mL/min    Comment: (NOTE) Calculated using the CKD-EPI Creatinine Equation (2021)    Anion gap 8 5 - 15    Comment: Performed at [facility], [address] [place], [place] [address]   Personally reviewed- areas of pneumatosis in the small bowel noted, no free fluid or free air CT ABDOMEN PELVIS WO CONTRAST  Result Date: 12/28/2020 CLINICAL DATA:  Epigastric abdominal pain for 1 month. EXAM: CT ABDOMEN AND PELVIS WITHOUT CONTRAST TECHNIQUE: Multidetector CT imaging of the abdomen and pelvis was performed following the standard protocol without IV contrast. COMPARISON:  November 06, 2017. FINDINGS: Lower chest: No acute abnormality. Hepatobiliary: No  focal liver abnormality is seen. Status post cholecystectomy. No biliary dilatation. Pancreas: Unremarkable. No pancreatic ductal dilatation or surrounding inflammatory changes. Spleen: Normal in size without focal abnormality. Adrenals/Urinary Tract: Adrenal glands are unremarkable. Kidneys are normal, without renal calculi, focal lesion, or hydronephrosis. Bladder is unremarkable. Stomach/Bowel: Stomach is unremarkable. The appendix is not visualized. Diverticulosis of the sigmoid colon is noted without inflammation. The colon is nondilated. However, there is interval development of pneumatosis involving small bowel loops without inflammatory change and minimal proximal small bowel dilatation. Vascular/Lymphatic: Aortic atherosclerosis. No enlarged abdominal or pelvic lymph nodes. Reproductive: Prostate is  unremarkable. Other: Bilateral fat containing inguinal hernias are again noted. No ascites is noted. Musculoskeletal: No acute or significant osseous findings. IMPRESSION: Interval development of extensive pneumatosis involving small bowel loops without definite inflammatory change. Minimal proximal small bowel dilatation is noted. This may simply represent benign pneumatosis, but ischemic bowel can not be excluded although felt to be unlikely. Clinical correlation is recommended. Differential diagnosis for benign pneumatosis can include pulmonary disease, intestinal inflammation iatrogenic due to procedures or certain medications. Critical Value/emergent results were called by telephone at the time of interpretation on 12/28/2020 at 7:21 pm to provider [facility] ZAMMIT , who verbally acknowledged these results. Electronically Signed   By: Marijo Conception M.D.   On: 12/28/2020 19:21   DG Chest Port 1 View  Result Date: 12/29/2020 CLINICAL DATA:  60 year old male central line placement. EXAM: PORTABLE CHEST 1 VIEW COMPARISON:  12/24/2020 chest radiographs. FINDINGS: Portable AP upright view at 0752 hours. Left IJ approach central line in place, tip at the level of the lower SVC, about 1 vertebral body height below the carina. No pneumothorax. Mediastinal contours remain normal. Lung ventilation is stable and within normal limits. No acute osseous abnormality identified. Negative visible bowel gas. IMPRESSION: Left IJ approach central line placed with tip at the lower SVC level. No pneumothorax or adverse features. Electronically Signed   By: Genevie Ann M.D.   On: 12/29/2020 08:19     Assessment & Plan:  BERISH BOHMAN is a 60 y.o. male with pneumatosis intestinalis of unknown etiology. After Cvl and arterial line placement and another bolus, his levophed is down to 1 mcg.  The patient is overall in good spirits and does not have peritonitis on exam.  He has some concern for possible gastritis, esophagitis given  the reported pain and the dark stools.   Differential- Cpap use causing pneumatosis; something related to medication/ ozempic / drug induced issues with mucosal integrity versus inflammation from prolonged anorexia and poor po in take  versus spectrum of ischemic bowel from low flow state  He continues to need more resuscitation and his K remains high making anesthesia challenging.   At this time is exam is overall benign and reassuring. The presentation is unusual and he very well may need exploration to rule out any ischemic changes.  I have discussed this with him and his wife.  Dicussed case with DR. Arnoldo Morale given the complexity and plan to get repeat CT this AM to assess any changes in order to better determine risk/ benefits of a surgical exploration which could be negative.   May ultimately need GI evaluation but would hold on any of that for now given the pneumatosis.   Updated team. Appreciate everyone's assistance.   All questions were answered to the satisfaction of the patient and family.   Virl Cagey 12/29/2020, 9:57 AM

## 2020-12-29 NOTE — Progress Notes (Signed)
Pharmacy Antibiotic Note  Nicholas Gray is a 60 y.o. male admitted on 12/28/2020 with possible ischemic bowel versus pneumatosis.  Pharmacy has been consulted for Zosyn dosing.  Plan: Zosyn 3.375g IV q8h (4 hour infusion).  Height: 5\' 10"  (177.8 cm) Weight: 124.7 kg (275 lb) IBW/kg (Calculated) : 73  Temp (24hrs), Avg:97.7 F (36.5 C), Min:97.6 F (36.4 C), Max:97.9 F (36.6 C)  Recent Labs  Lab 12/28/20 1620 12/28/20 2036 12/28/20 2234 12/29/20 0043 12/29/20 0548  WBC 15.5*  --   --   --  14.7*  CREATININE 3.34*  --   --  2.62* 2.61*  LATICACIDVEN  --  0.9 1.4  --  1.0    Estimated Creatinine Clearance: 39.4 mL/min (A) (by C-G formula based on SCr of 2.61 mg/dL (H)).    Allergies  Allergen Reactions   Ozempic (0.25 Or 0.5 Mg-Dose) [Semaglutide(0.25 Or 0.5mg -Dos)]     GI upset on 0.25mg  dose    Antimicrobials this admission: Zosyn 9/24 >> Flagyl 9/23   Microbiology results: MRSA PCR: neg  Thank you for allowing pharmacy to be a part of this patient's care.  Ramond Craver 12/29/2020 7:37 AM

## 2020-12-29 NOTE — Progress Notes (Signed)
PROGRESS NOTE    Nicholas Gray  EHM:[id number] DOB: 06/10/1960 DOA: 12/28/2020 PCP: [facility]   Brief Narrative:   Nicholas Gray is a 60 y.o. male with a history of cardiomyopathy and HFrEF with EF of 45% on echo 11/2020, GERD, hypertension, hyperlipidemia, hypothyroidism, history of cholecystectomy in 2019.  Due to obesity and hyperlipidemia, the patient was placed on Ozempic around 8/18 shortly afterwards, the patient started having abdominal pain with symptoms of food or liquid getting stuck in his esophagus whenever he eats or drinks anything.  Patient was noted to have pneumatosis intestinalis along with AKI, metabolic acidosis and hyperkalemia.  He is currently being managed with IV fluids and medications to help with his potassium levels.  General surgery planning for repeat CT prior to consideration of possible operative intervention.  Assessment & Plan:   Principal Problem:   Pneumatosis intestinalis Active Problems:   Essential hypertension   Dilated cardiomyopathy (HCC)   LBBB (left bundle branch block)   OSA (obstructive sleep apnea)   Chronic systolic CHF (congestive heart failure) (HCC)   Hyponatremia   Hyperkalemia   Acute renal injury ([place])   Dehydration   Pneumatosis intestinalis -Possibly related to Ozempic use -Repeat CT abdomen pending -Continue Zosyn as ordered -Appreciate general surgery recommendations with no need for emergent surgery  Circulatory shock -Currently on norepinephrine -Appreciate general surgery with central venous line placement as well as arterial line placement -Wean norepinephrine as tolerated -Continue IV fluid -Plan to check a.m. cortisol  AKI with associated hyperkalemia and metabolic acidosis -Nonoliguric with improving creatinine levels, likely prerenal -Started on sodium bicarb and a drip -Careful monitoring of potassium levels ongoing with need to keep n.p.o. -Continue close telemetry  monitoring  Hyponatremia -Slowly improving -Check TSH, urine osmolarity and serum osmolarity -Currently strict n.p.o. -Continue IV normal saline  Dilated cardiomyopathy -Hold antihypertensive therapy due to hypotension as well as diuretics -Hold aspirin  OSA -CPAP at night  DVT prophylaxis:SCDs Code Status: Full Family Communication: None at bedside Disposition Plan:  Status is: Inpatient  Remains inpatient appropriate because:IV treatments appropriate due to intensity of illness or inability to take PO and Inpatient level of care appropriate due to severity of illness  Dispo: The patient is from: Home              Anticipated d/c is to: Home              Patient currently is not medically stable to d/c.   Difficult to place patient No  Consultants:  GS  Procedures:  See below  Antimicrobials:  Anti-infectives (From admission, onward)    Start     Dose/Rate Route Frequency Ordered Stop   12/29/20 1600  piperacillin-tazobactam (ZOSYN) IVPB 3.375 g        3.375 g 12.5 mL/hr over 240 Minutes Intravenous Every 8 hours 12/29/20 0735     12/29/20 0800  piperacillin-tazobactam (ZOSYN) IVPB 3.375 g        3.375 g 100 mL/hr over 30 Minutes Intravenous  Once 12/29/20 0734 12/29/20 0949   12/28/20 2100  metroNIDAZOLE (FLAGYL) IVPB 500 mg  Status:  Discontinued        500 mg 100 mL/hr over 60 Minutes Intravenous Every 12 hours 12/28/20 2059 12/29/20 0734       Subjective: Patient seen and evaluated today with no new acute complaints or concerns. No acute concerns or events noted overnight.  Objective: Vitals:   12/29/20 1000 12/29/20 1024 12/29/20 1100 12/29/20  1126  BP: (!) 106/45  (!) 106/47   Pulse: 69 64 65 62  Resp: 17 17 (!) 22 (!) 23  Temp:   (!) 97.5 F (36.4 C)   TempSrc:   Oral   SpO2: 97% 97% 98% 98%  Weight:      Height:        Intake/Output Summary (Last 24 hours) at 12/29/2020 1204 Last data filed at 12/29/2020 1132 Gross per 24 hour  Intake  4387.88 ml  Output 1500 ml  Net 2887.88 ml   Filed Weights   12/28/20 1556  Weight: 124.7 kg    Examination:  General exam: Appears calm and comfortable  Respiratory system: Clear to auscultation. Respiratory effort normal. Cardiovascular system: S1 & S2 heard, RRR.  Gastrointestinal system: Abdomen is soft, minimal epigastric tenderness Central nervous system: Alert and awake Extremities: No edema Skin: No significant lesions noted Psychiatry: Flat affect.    Data Reviewed: I have personally reviewed following labs and imaging studies  CBC: Recent Labs  Lab 12/28/20 1620 12/29/20 0548  WBC 15.5* 14.7*  HGB 13.0 11.4*  HCT 37.5* 33.7*  MCV 95.2 95.5  PLT 221 650   Basic Metabolic Panel: Recent Labs  Lab 12/28/20 1620 12/29/20 0043 12/29/20 0548 12/29/20 0832  NA 126* 128* 127* 130*  K 5.9* 6.1* 5.8* 5.8*  CL 104 110 108 107  CO2 12* 10* 12* 15*  GLUCOSE 119* 109* 165* 104*  BUN 124* 87* 100* 95*  CREATININE 3.34* 2.62* 2.61* 2.34*  CALCIUM 8.3* 8.0* 8.1* 8.4*   GFR: Estimated Creatinine Clearance: 43.9 mL/min (A) (by C-G formula based on SCr of 2.34 mg/dL (H)). Liver Function Tests: Recent Labs  Lab 12/28/20 1620  AST 18  ALT 27  ALKPHOS 43  BILITOT 0.2*  PROT 7.6  ALBUMIN 4.1   Recent Labs  Lab 12/28/20 1620  LIPASE 67*   No results for input(s): AMMONIA in the last 168 hours. Coagulation Profile: No results for input(s): INR, PROTIME in the last 168 hours. Cardiac Enzymes: No results for input(s): CKTOTAL, CKMB, CKMBINDEX, TROPONINI in the last 168 hours. BNP (last 3 results) No results for input(s): PROBNP in the last 8760 hours. HbA1C: No results for input(s): HGBA1C in the last 72 hours. CBG: Recent Labs  Lab 12/29/20 0022 12/29/20 0442 12/29/20 0748 12/29/20 1118  GLUCAP 92 210* 118* 74   Lipid Profile: No results for input(s): CHOL, HDL, LDLCALC, TRIG, CHOLHDL, LDLDIRECT in the last 72 hours. Thyroid Function Tests: No  results for input(s): TSH, T4TOTAL, FREET4, T3FREE, THYROIDAB in the last 72 hours. Anemia Panel: No results for input(s): VITAMINB12, FOLATE, FERRITIN, TIBC, IRON, RETICCTPCT in the last 72 hours. Sepsis Labs: Recent Labs  Lab 12/28/20 2036 12/28/20 2234 12/29/20 0548 12/29/20 0832  LATICACIDVEN 0.9 1.4 1.0 1.2    Recent Results (from the past 240 hour(s))  Resp Panel by RT-PCR (Flu A&B, Covid) Nasopharyngeal Swab     Status: None   Collection Time: 12/28/20  9:39 PM   Specimen: Nasopharyngeal Swab; Nasopharyngeal(NP) swabs in vial transport medium  Result Value Ref Range Status   SARS Coronavirus 2 by RT PCR NEGATIVE NEGATIVE Final    Comment: (NOTE) SARS-CoV-2 target nucleic acids are NOT DETECTED.  The SARS-CoV-2 RNA is generally detectable in upper respiratory specimens during the acute phase of infection. The lowest concentration of SARS-CoV-2 viral copies this assay can detect is 138 copies/mL. A negative result does not preclude SARS-Cov-2 infection and should not be used as the  sole basis for treatment or other patient management decisions. A negative result may occur with  improper specimen collection/handling, submission of specimen other than nasopharyngeal swab, presence of viral mutation(s) within the areas targeted by this assay, and inadequate number of viral copies(<138 copies/mL). A negative result must be combined with clinical observations, patient history, and epidemiological information. The expected result is Negative.  Fact Sheet for Patients:  EntrepreneurPulse.com.au  Fact Sheet for Healthcare Providers:  IncredibleEmployment.be  This test is no t yet approved or cleared by the [place] FDA and  has been authorized for detection and/or diagnosis of SARS-CoV-2 by FDA under an Emergency Use Authorization (EUA). This EUA will remain  in effect (meaning this test can be used) for the duration of the COVID-19  declaration under Section 564(b)(1) of the Act, 21 U.S.C.section 360bbb-3(b)(1), unless the authorization is terminated  or revoked sooner.       Influenza A by PCR NEGATIVE NEGATIVE Final   Influenza B by PCR NEGATIVE NEGATIVE Final    Comment: (NOTE) The Xpert Xpress SARS-CoV-2/FLU/RSV plus assay is intended as an aid in the diagnosis of influenza from Nasopharyngeal swab specimens and should not be used as a sole basis for treatment. Nasal washings and aspirates are unacceptable for Xpert Xpress SARS-CoV-2/FLU/RSV testing.  Fact Sheet for Patients: EntrepreneurPulse.com.au  Fact Sheet for Healthcare Providers: IncredibleEmployment.be  This test is not yet approved or cleared by the [place] FDA and has been authorized for detection and/or diagnosis of SARS-CoV-2 by FDA under an Emergency Use Authorization (EUA). This EUA will remain in effect (meaning this test can be used) for the duration of the COVID-19 declaration under Section 564(b)(1) of the Act, 21 U.S.C. section 360bbb-3(b)(1), unless the authorization is terminated or revoked.  Performed at [facility], [address], [place], [place] [address]   MRSA Next Gen by PCR, Nasal     Status: None   Collection Time: 12/28/20 11:59 PM   Specimen: Nasal Mucosa; Nasal Swab  Result Value Ref Range Status   MRSA by PCR Next Gen NOT DETECTED NOT DETECTED Final    Comment: (NOTE) The GeneXpert MRSA Assay (FDA approved for NASAL specimens only), is one component of a comprehensive MRSA colonization surveillance program. It is not intended to diagnose MRSA infection nor to guide or monitor treatment for MRSA infections. Test performance is not FDA approved in patients less than 61 years old. Performed at [facility], [address], [place], [place] [address]          Radiology Studies: CT ABDOMEN PELVIS WO CONTRAST  Result Date: 12/28/2020 CLINICAL DATA:  Epigastric abdominal  pain for 1 month. EXAM: CT ABDOMEN AND PELVIS WITHOUT CONTRAST TECHNIQUE: Multidetector CT imaging of the abdomen and pelvis was performed following the standard protocol without IV contrast. COMPARISON:  November 06, 2017. FINDINGS: Lower chest: No acute abnormality. Hepatobiliary: No focal liver abnormality is seen. Status post cholecystectomy. No biliary dilatation. Pancreas: Unremarkable. No pancreatic ductal dilatation or surrounding inflammatory changes. Spleen: Normal in size without focal abnormality. Adrenals/Urinary Tract: Adrenal glands are unremarkable. Kidneys are normal, without renal calculi, focal lesion, or hydronephrosis. Bladder is unremarkable. Stomach/Bowel: Stomach is unremarkable. The appendix is not visualized. Diverticulosis of the sigmoid colon is noted without inflammation. The colon is nondilated. However, there is interval development of pneumatosis involving small bowel loops without inflammatory change and minimal proximal small bowel dilatation. Vascular/Lymphatic: Aortic atherosclerosis. No enlarged abdominal or pelvic lymph nodes. Reproductive: Prostate is unremarkable. Other: Bilateral fat containing  inguinal hernias are again noted. No ascites is noted. Musculoskeletal: No acute or significant osseous findings. IMPRESSION: Interval development of extensive pneumatosis involving small bowel loops without definite inflammatory change. Minimal proximal small bowel dilatation is noted. This may simply represent benign pneumatosis, but ischemic bowel can not be excluded although felt to be unlikely. Clinical correlation is recommended. Differential diagnosis for benign pneumatosis can include pulmonary disease, intestinal inflammation iatrogenic due to procedures or certain medications. Critical Value/emergent results were called by telephone at the time of interpretation on 12/28/2020 at 7:21 pm to provider [facility] ZAMMIT , who verbally acknowledged these results. Electronically Signed    By: Marijo Conception M.D.   On: 12/28/2020 19:21   DG Chest Port 1 View  Result Date: 12/29/2020 CLINICAL DATA:  60 year old male central line placement. EXAM: PORTABLE CHEST 1 VIEW COMPARISON:  12/24/2020 chest radiographs. FINDINGS: Portable AP upright view at 0752 hours. Left IJ approach central line in place, tip at the level of the lower SVC, about 1 vertebral body height below the carina. No pneumothorax. Mediastinal contours remain normal. Lung ventilation is stable and within normal limits. No acute osseous abnormality identified. Negative visible bowel gas. IMPRESSION: Left IJ approach central line placed with tip at the lower SVC level. No pneumothorax or adverse features. Electronically Signed   By: Genevie Ann M.D.   On: 12/29/2020 08:19        Scheduled Meds:  Chlorhexidine Gluconate Cloth  6 each Topical Q0600   insulin aspart  0-15 Units Subcutaneous Q4H   [START ON 12/31/2020] levothyroxine  25 mcg Intravenous Daily   pantoprazole (PROTONIX) IV  40 mg Intravenous QHS   Continuous Infusions:  sodium chloride Stopped (12/29/20 0143)   lactated ringers 10 mL/hr at 12/29/20 1018   norepinephrine (LEVOPHED) Adult infusion 1 mcg/min (12/29/20 1018)   piperacillin-tazobactam (ZOSYN)  IV     sodium bicarbonate in 0.45 NS mL infusion 100 mL/hr at 12/29/20 1124   vasopressin Stopped (12/29/20 0622)     LOS: 1 day    Critical care time: 40 minutes.    Laronica Bhagat Darleen Crocker, DO [facility]  If 7PM-7AM, please contact night-coverage [email or web address] 12/29/2020, 12:04 PM

## 2020-12-30 DIAGNOSIS — K6389 Other specified diseases of intestine: Secondary | ICD-10-CM | POA: Diagnosis not present

## 2020-12-30 LAB — BASIC METABOLIC PANEL
Anion gap: 7 (ref 5–15)
BUN: 59 mg/dL — ABNORMAL HIGH (ref 8–23)
CO2: 20 mmol/L — ABNORMAL LOW (ref 22–32)
Calcium: 8.1 mg/dL — ABNORMAL LOW (ref 8.9–10.3)
Chloride: 102 mmol/L (ref 98–111)
Creatinine, Ser: 1.64 mg/dL — ABNORMAL HIGH (ref 0.61–1.24)
GFR, Estimated: 47 mL/min — ABNORMAL LOW (ref >60)
Glucose, Bld: 121 mg/dL — ABNORMAL HIGH (ref 70–99)
Potassium: 4.5 mmol/L (ref 3.5–5.1)
Sodium: 129 mmol/L — ABNORMAL LOW (ref 135–145)

## 2020-12-30 LAB — CBC
HCT: 31.8 % — ABNORMAL LOW (ref 39.0–52.0)
Hemoglobin: 10.8 g/dL — ABNORMAL LOW (ref 13.0–17.0)
MCH: 32 pg (ref 26.0–34.0)
MCHC: 34 g/dL (ref 30.0–36.0)
MCV: 94.4 fL (ref 80.0–100.0)
Platelets: 204 10*3/uL (ref 150–400)
RBC: 3.37 MIL/uL — ABNORMAL LOW (ref 4.22–5.81)
RDW: 12.6 % (ref 11.5–15.5)
WBC: 12 10*3/uL — ABNORMAL HIGH (ref 4.0–10.5)
nRBC: 0 % (ref 0.0–0.2)

## 2020-12-30 LAB — MAGNESIUM: Magnesium: 1.4 mg/dL — ABNORMAL LOW (ref 1.7–2.4)

## 2020-12-30 LAB — GLUCOSE, CAPILLARY
Glucose-Capillary: 115 mg/dL — ABNORMAL HIGH (ref 70–99)
Glucose-Capillary: 124 mg/dL — ABNORMAL HIGH (ref 70–99)
Glucose-Capillary: 114 mg/dL — ABNORMAL HIGH (ref 70–99)
Glucose-Capillary: 131 mg/dL — ABNORMAL HIGH (ref 70–99)
Glucose-Capillary: 128 mg/dL — ABNORMAL HIGH (ref 70–99)
Glucose-Capillary: 83 mg/dL (ref 70–99)

## 2020-12-30 MED ORDER — MAGNESIUM SULFATE 2 GM/50ML IV SOLN
2.0000 g | Freq: Once | INTRAVENOUS | Status: AC
  Administered 2020-12-30: 2 g via INTRAVENOUS
  Filled 2020-12-30: qty 50

## 2020-12-30 NOTE — Progress Notes (Signed)
Patient feeling much better.  Greeted me when I walked into his room.  Denies abdominal pain.  He remains on low-dose Levophed 5 mics  Vital signs in last 24 hours: Temp:  [97.5 F (36.4 C)-97.9 F (36.6 C)] 97.9 F (36.6 C) (09/25 0740) Pulse Rate:  [58-91] 70 (09/25 1000) Resp:  [12-26] 20 (09/25 1000) BP: (76-116)/(32-60) 103/58 (09/25 1000) SpO2:  [90 %-99 %] 95 % (09/25 1000) Arterial Line BP: (71-118)/(40-76) 84/48 (09/25 0600) Weight:  [124.7 kg] 124.7 kg (09/25 0500) Last BM Date: 12/29/20 General:   Alert,   pleasant and cooperative in NAD; accompanied by his wife. Abdomen: Nondistended.  Positive bowel sounds.  His abdomen is soft and nontender.  Intake/Output from previous day: 09/24 0701 - 09/25 0700 In: 3523.7 [I.V.:3386.8; IV Piggyback:136.9] Out: 4650 [Urine:4650] Intake/Output this shift: Total I/O In: 1504.6 [I.V.:1404.7; IV Piggyback:100] Out: 400 [Urine:400]  Lab Results: Recent Labs    12/29/20 0548 12/29/20 1901 12/30/20 0400  WBC 14.7* 11.1* 12.0*  HGB 11.4* 11.3* 10.8*  HCT 33.7* 32.1* 31.8*  PLT 235 184 204   BMET Recent Labs    12/29/20 1230 12/29/20 1901 12/30/20 0400  NA 127* 129* 129*  K 5.4* 4.9 4.5  CL 104 104 102  CO2 16* 19* 20*  GLUCOSE 82 99 121*  BUN 84* 73* 59*  CREATININE 2.21* 2.00* 1.64*  CALCIUM 8.5* 8.5* 8.1*   LFT Recent Labs    12/28/20 1620  PROT 7.6  ALBUMIN 4.1  AST 18  ALT 27  ALKPHOS 43  BILITOT 0.2*   PT/INR No results for input(s): LABPROT, INR in the last 72 hours. Hepatitis Panel No results for input(s): HEPBSAG, HCVAB, HEPAIGM, HEPBIGM in the last 72 hours. C-Diff No results for input(s): CDIFFTOX in the last 72 hours.  Studies/Results: CT ABDOMEN PELVIS WO CONTRAST  Result Date: 12/29/2020 CLINICAL DATA:  Abdominal pain, follow-up pneumatosis EXAM: CT ABDOMEN AND PELVIS WITHOUT CONTRAST TECHNIQUE: Multidetector CT imaging of the abdomen and pelvis was performed following the standard  protocol without IV contrast. COMPARISON:  12/28/2020 FINDINGS: Lower chest: No acute abnormality. Hepatobiliary: No solid liver abnormality is seen. No gallstones, gallbladder wall thickening, or biliary dilatation. Pancreas: Unremarkable. No pancreatic ductal dilatation or surrounding inflammatory changes. Spleen: Normal in size without significant abnormality. Adrenals/Urinary Tract: Adrenal glands are unremarkable. Kidneys are normal, without renal calculi, solid lesion, or hydronephrosis. Bladder is unremarkable. Stomach/Bowel: Stomach is within normal limits. There is redemonstrated, diffuse pneumatosis of the small bowel, involving almost the entirety of its length, and similar in appearance to prior examination. Oral enteric contrast has transited to the cecum. No distension or inflammatory thickening of bowel wall. Appendix appears diminutive although normal. Sigmoid diverticulosis. Vascular/Lymphatic: Aortic atherosclerosis. No enlarged abdominal or pelvic lymph nodes. Reproductive: No mass or other significant abnormality. Other: Fat containing bilateral inguinal hernias. No abdominopelvic ascites. Musculoskeletal: No acute or significant osseous findings. IMPRESSION: 1. There is redemonstrated, diffuse pneumatosis of the small bowel, involving almost the entirety of its length, and similar in appearance to prior examination. Oral enteric contrast has transited to the cecum, and there is no distention or inflammatory thickening of bowel wall. Findings favor benign pneumatosis intestinalis. 2.  Sigmoid diverticulosis. Aortic Atherosclerosis (ICD10-I70.0). Electronically Signed   By: Eddie Candle M.D.   On: 12/29/2020 13:20   CT ABDOMEN PELVIS WO CONTRAST  Result Date: 12/28/2020 CLINICAL DATA:  Epigastric abdominal pain for 1 month. EXAM: CT ABDOMEN AND PELVIS WITHOUT CONTRAST TECHNIQUE: Multidetector CT imaging of the  abdomen and pelvis was performed following the standard protocol without IV  contrast. COMPARISON:  November 06, 2017. FINDINGS: Lower chest: No acute abnormality. Hepatobiliary: No focal liver abnormality is seen. Status post cholecystectomy. No biliary dilatation. Pancreas: Unremarkable. No pancreatic ductal dilatation or surrounding inflammatory changes. Spleen: Normal in size without focal abnormality. Adrenals/Urinary Tract: Adrenal glands are unremarkable. Kidneys are normal, without renal calculi, focal lesion, or hydronephrosis. Bladder is unremarkable. Stomach/Bowel: Stomach is unremarkable. The appendix is not visualized. Diverticulosis of the sigmoid colon is noted without inflammation. The colon is nondilated. However, there is interval development of pneumatosis involving small bowel loops without inflammatory change and minimal proximal small bowel dilatation. Vascular/Lymphatic: Aortic atherosclerosis. No enlarged abdominal or pelvic lymph nodes. Reproductive: Prostate is unremarkable. Other: Bilateral fat containing inguinal hernias are again noted. No ascites is noted. Musculoskeletal: No acute or significant osseous findings. IMPRESSION: Interval development of extensive pneumatosis involving small bowel loops without definite inflammatory change. Minimal proximal small bowel dilatation is noted. This may simply represent benign pneumatosis, but ischemic bowel can not be excluded although felt to be unlikely. Clinical correlation is recommended. Differential diagnosis for benign pneumatosis can include pulmonary disease, intestinal inflammation iatrogenic due to procedures or certain medications. Critical Value/emergent results were called by telephone at the time of interpretation on 12/28/2020 at 7:21 pm to provider [facility] ZAMMIT , who verbally acknowledged these results. Electronically Signed   By: Marijo Conception M.D.   On: 12/28/2020 19:21   DG Chest Port 1 View  Result Date: 12/29/2020 CLINICAL DATA:  60 year old male central line placement. EXAM: PORTABLE CHEST 1  VIEW COMPARISON:  12/24/2020 chest radiographs. FINDINGS: Portable AP upright view at 0752 hours. Left IJ approach central line in place, tip at the level of the lower SVC, about 1 vertebral body height below the carina. No pneumothorax. Mediastinal contours remain normal. Lung ventilation is stable and within normal limits. No acute osseous abnormality identified. Negative visible bowel gas. IMPRESSION: Left IJ approach central line placed with tip at the lower SVC level. No pneumothorax or adverse features. Electronically Signed   By: Genevie Ann M.D.   On: 12/29/2020 08:19     Impression: 59 year old gentleman with cardiomyopathy/history of heart failure with reduced ejection fraction admitted to the hospital with a 1 month history of malaise, anorexia , vague odynophagia, dysphagia in the setting of upper abdominal pain.  He presented acidotic and acute renal failure.  Extensive pneumatosis intestinalis noted on CT scanning.  His illness started shortly after taking his first dose of Ozempic.  Today, after critical care intervention, he appears much improved.  His acidosis, renal failure and electrolyte abnormalities are improved.  White count improved.  No fever. He has a benign abdominal exam this morning.   Serial CTs show stable pneumatosis.  I see no need for operative intervention at this time.  He remains on low-dose Levophed.  Hopefully taper off in the next 24 hours.  Recommendations:  Trial of oral intake when deemed appropriate by Dr. Constance Haw  Continue PPI  Would avoid Ozempic/similar class meds moving forward  Eventual CTA abdomen-could be done more electively at this point in time  May or may not need an EGD this admission  We will continue to follow him with you

## 2020-12-30 NOTE — Progress Notes (Signed)
PROGRESS NOTE    LOCKE BARRELL  CLE:[id number] DOB: [birth year] DOA: 12/28/2020 PCP: [facility]   Brief Narrative:   Nicholas Gray is a 60 y.o. male with a history of cardiomyopathy and HFrEF with EF of 45% on echo 11/2020, GERD, hypertension, hyperlipidemia, hypothyroidism, history of cholecystectomy in 2019.  Due to obesity and hyperlipidemia, the patient was placed on Ozempic around 8/18 shortly afterwards, the patient started having abdominal pain with symptoms of food or liquid getting stuck in his esophagus whenever he eats or drinks anything.  Patient was noted to have pneumatosis intestinalis along with AKI, metabolic acidosis and hyperkalemia.  He is currently being managed with IV fluids and medications to help with his potassium levels.  General surgery planning for repeat CT prior to consideration of possible operative intervention.  Assessment & Plan:   Principal Problem:   Pneumatosis intestinalis Active Problems:   Essential hypertension   Dilated cardiomyopathy (HCC)   LBBB (left bundle branch block)   OSA (obstructive sleep apnea)   Chronic systolic CHF (congestive heart failure) (HCC)   Hyponatremia   Hyperkalemia   Acute renal injury ([place])   Dehydration   Pneumatosis intestinalis -Possibly related to Ozempic use -Repeat CT abdomen stable -Continue Zosyn as ordered -Appreciate general surgery and GI recommendations with no need for emergent surgery -May require CT abdomen to evaluate for mesenteric stenosis as well as possible EGD   Circulatory shock-slowly improving -Currently on norepinephrine -Appreciate general surgery with central venous line placement as well as arterial line placement -Wean norepinephrine as tolerated -Continue IV fluid -Cortisol WNL   AKI with associated hyperkalemia and metabolic acidosis -Nonoliguric with improving creatinine levels, likely prerenal -Started on sodium bicarb and a drip -Careful  monitoring of potassium levels ongoing with need to keep n.p.o. -Continue close telemetry monitoring   Hyponatremia due to SIADH -Currently NPO -TSH 1.033 -Currently strict n.p.o. -Continue IV normal saline for now   Dilated cardiomyopathy -Hold antihypertensive therapy due to hypotension as well as diuretics -Hold aspirin   OSA -CPAP at night   DVT prophylaxis:SCDs Code Status: Full Family Communication: None at bedside Disposition Plan:  Status is: Inpatient   Remains inpatient appropriate because:IV treatments appropriate due to intensity of illness or inability to take PO and Inpatient level of care appropriate due to severity of illness   Dispo: The patient is from: Home              Anticipated d/c is to: Home              Patient currently is not medically stable to d/c.              Difficult to place patient No   Consultants:  [facility]   Procedures:  See below  Antimicrobials:  Anti-infectives (From admission, onward)    Start     Dose/Rate Route Frequency Ordered Stop   12/29/20 1600  piperacillin-tazobactam (ZOSYN) IVPB 3.375 g        3.375 g 12.5 mL/hr over 240 Minutes Intravenous Every 8 hours 12/29/20 0735     12/29/20 0800  piperacillin-tazobactam (ZOSYN) IVPB 3.375 g        3.375 g 100 mL/hr over 30 Minutes Intravenous  Once 12/29/20 0734 12/29/20 0949   12/28/20 2100  metroNIDAZOLE (FLAGYL) IVPB 500 mg  Status:  Discontinued        500 mg 100 mL/hr over 60 Minutes Intravenous Every 12 hours 12/28/20 2059 12/29/20 0734  Subjective: Patient seen and evaluated today with no new acute complaints or concerns. No acute concerns or events noted overnight.  He continues to pass gas and did have a bowel movement yesterday.  He denies significant abdominal pain.  Objective: Vitals:   12/30/20 0740 12/30/20 0800 12/30/20 0900 12/30/20 1000  BP:  (!) 91/54 (!) 91/48 (!) 103/58  Pulse: 66 66 77 70  Resp: 18 17  20   Temp: 97.9 F (36.6 C)      TempSrc: Oral     SpO2: 97% 96%  95%  Weight:      Height:        Intake/Output Summary (Last 24 hours) at 12/30/2020 1024 Last data filed at 12/30/2020 0953 Gross per 24 hour  Intake 2842.21 ml  Output 4250 ml  Net -1407.79 ml   Filed Weights   12/28/20 1556 12/30/20 0500  Weight: 124.7 kg 124.7 kg    Examination:  General exam: Appears calm and comfortable  Respiratory system: Clear to auscultation. Respiratory effort normal. Cardiovascular system: S1 & S2 heard, RRR.  Gastrointestinal system: Abdomen is soft Central nervous system: Alert and awake Extremities: No edema Skin: No significant lesions noted Psychiatry: Flat affect.    Data Reviewed: I have personally reviewed following labs and imaging studies  CBC: Recent Labs  Lab 12/28/20 1620 12/29/20 0548 12/29/20 1901 12/30/20 0400  WBC 15.5* 14.7* 11.1* 12.0*  NEUTROABS  --   --  7.1  --   HGB 13.0 11.4* 11.3* 10.8*  HCT 37.5* 33.7* 32.1* 31.8*  MCV 95.2 95.5 95.0 94.4  PLT 221 235 184 481   Basic Metabolic Panel: Recent Labs  Lab 12/29/20 0548 12/29/20 0832 12/29/20 1230 12/29/20 1901 12/30/20 0400  NA 127* 130* 127* 129* 129*  K 5.8* 5.8* 5.4* 4.9 4.5  CL 108 107 104 104 102  CO2 12* 15* 16* 19* 20*  GLUCOSE 165* 104* 82 99 121*  BUN 100* 95* 84* 73* 59*  CREATININE 2.61* 2.34* 2.21* 2.00* 1.64*  CALCIUM 8.1* 8.4* 8.5* 8.5* 8.1*  MG  --   --   --   --  1.4*   GFR: Estimated Creatinine Clearance: 62.7 mL/min (A) (by C-G formula based on SCr of 1.64 mg/dL (H)). Liver Function Tests: Recent Labs  Lab 12/28/20 1620  AST 18  ALT 27  ALKPHOS 43  BILITOT 0.2*  PROT 7.6  ALBUMIN 4.1   Recent Labs  Lab 12/28/20 1620  LIPASE 67*   No results for input(s): AMMONIA in the last 168 hours. Coagulation Profile: No results for input(s): INR, PROTIME in the last 168 hours. Cardiac Enzymes: No results for input(s): CKTOTAL, CKMB, CKMBINDEX, TROPONINI in the last 168 hours. BNP (last 3  results) No results for input(s): PROBNP in the last 8760 hours. HbA1C: No results for input(s): HGBA1C in the last 72 hours. CBG: Recent Labs  Lab 12/29/20 1519 12/29/20 2037 12/30/20 0110 12/30/20 0425 12/30/20 0742  GLUCAP 87 112* 115* 124* 114*   Lipid Profile: No results for input(s): CHOL, HDL, LDLCALC, TRIG, CHOLHDL, LDLDIRECT in the last 72 hours. Thyroid Function Tests: Recent Labs    12/29/20 1212  TSH 1.033   Anemia Panel: No results for input(s): VITAMINB12, FOLATE, FERRITIN, TIBC, IRON, RETICCTPCT in the last 72 hours. Sepsis Labs: Recent Labs  Lab 12/28/20 2036 12/28/20 2234 12/29/20 0548 12/29/20 0832  LATICACIDVEN 0.9 1.4 1.0 1.2    Recent Results (from the past 240 hour(s))  Resp Panel by RT-PCR (Flu A&B, Covid)  Nasopharyngeal Swab     Status: None   Collection Time: 12/28/20  9:39 PM   Specimen: Nasopharyngeal Swab; Nasopharyngeal(NP) swabs in vial transport medium  Result Value Ref Range Status   SARS Coronavirus 2 by RT PCR NEGATIVE NEGATIVE Final    Comment: (NOTE) SARS-CoV-2 target nucleic acids are NOT DETECTED.  The SARS-CoV-2 RNA is generally detectable in upper respiratory specimens during the acute phase of infection. The lowest concentration of SARS-CoV-2 viral copies this assay can detect is 138 copies/mL. A negative result does not preclude SARS-Cov-2 infection and should not be used as the sole basis for treatment or other patient management decisions. A negative result may occur with  improper specimen collection/handling, submission of specimen other than nasopharyngeal swab, presence of viral mutation(s) within the areas targeted by this assay, and inadequate number of viral copies(<138 copies/mL). A negative result must be combined with clinical observations, patient history, and epidemiological information. The expected result is Negative.  Fact Sheet for Patients:  EntrepreneurPulse.com.au  Fact Sheet for  Healthcare Providers:  IncredibleEmployment.be  This test is no t yet approved or cleared by the [place] FDA and  has been authorized for detection and/or diagnosis of SARS-CoV-2 by FDA under an Emergency Use Authorization (EUA). This EUA will remain  in effect (meaning this test can be used) for the duration of the COVID-19 declaration under Section 564(b)(1) of the Act, 21 U.S.C.section 360bbb-3(b)(1), unless the authorization is terminated  or revoked sooner.       Influenza A by PCR NEGATIVE NEGATIVE Final   Influenza B by PCR NEGATIVE NEGATIVE Final    Comment: (NOTE) The Xpert Xpress SARS-CoV-2/FLU/RSV plus assay is intended as an aid in the diagnosis of influenza from Nasopharyngeal swab specimens and should not be used as a sole basis for treatment. Nasal washings and aspirates are unacceptable for Xpert Xpress SARS-CoV-2/FLU/RSV testing.  Fact Sheet for Patients: EntrepreneurPulse.com.au  Fact Sheet for Healthcare Providers: IncredibleEmployment.be  This test is not yet approved or cleared by the [place] FDA and has been authorized for detection and/or diagnosis of SARS-CoV-2 by FDA under an Emergency Use Authorization (EUA). This EUA will remain in effect (meaning this test can be used) for the duration of the COVID-19 declaration under Section 564(b)(1) of the Act, 21 U.S.C. section 360bbb-3(b)(1), unless the authorization is terminated or revoked.  Performed at [facility] [address], [place], [place] [address]   MRSA Next Gen by PCR, Nasal     Status: None   Collection Time: 12/28/20 11:59 PM   Specimen: Nasal Mucosa; Nasal Swab  Result Value Ref Range Status   MRSA by PCR Next Gen NOT DETECTED NOT DETECTED Final    Comment: (NOTE) The GeneXpert MRSA Assay (FDA approved for NASAL specimens only), is one component of a comprehensive MRSA colonization surveillance program. It is not intended  to diagnose MRSA infection nor to guide or monitor treatment for MRSA infections. Test performance is not FDA approved in patients less than 69 years old. Performed at [facility], [address], [place], [place] [address]          Radiology Studies: CT ABDOMEN PELVIS WO CONTRAST  Result Date: 12/29/2020 CLINICAL DATA:  Abdominal pain, follow-up pneumatosis EXAM: CT ABDOMEN AND PELVIS WITHOUT CONTRAST TECHNIQUE: Multidetector CT imaging of the abdomen and pelvis was performed following the standard protocol without IV contrast. COMPARISON:  12/28/2020 FINDINGS: Lower chest: No acute abnormality. Hepatobiliary: No solid liver abnormality is seen. No gallstones, gallbladder wall  thickening, or biliary dilatation. Pancreas: Unremarkable. No pancreatic ductal dilatation or surrounding inflammatory changes. Spleen: Normal in size without significant abnormality. Adrenals/Urinary Tract: Adrenal glands are unremarkable. Kidneys are normal, without renal calculi, solid lesion, or hydronephrosis. Bladder is unremarkable. Stomach/Bowel: Stomach is within normal limits. There is redemonstrated, diffuse pneumatosis of the small bowel, involving almost the entirety of its length, and similar in appearance to prior examination. Oral enteric contrast has transited to the cecum. No distension or inflammatory thickening of bowel wall. Appendix appears diminutive although normal. Sigmoid diverticulosis. Vascular/Lymphatic: Aortic atherosclerosis. No enlarged abdominal or pelvic lymph nodes. Reproductive: No mass or other significant abnormality. Other: Fat containing bilateral inguinal hernias. No abdominopelvic ascites. Musculoskeletal: No acute or significant osseous findings. IMPRESSION: 1. There is redemonstrated, diffuse pneumatosis of the small bowel, involving almost the entirety of its length, and similar in appearance to prior examination. Oral enteric contrast has transited to the cecum, and there is no  distention or inflammatory thickening of bowel wall. Findings favor benign pneumatosis intestinalis. 2.  Sigmoid diverticulosis. Aortic Atherosclerosis (ICD10-I70.0). Electronically Signed   By: Eddie Candle M.D.   On: 12/29/2020 13:20   CT ABDOMEN PELVIS WO CONTRAST  Result Date: 12/28/2020 CLINICAL DATA:  Epigastric abdominal pain for 1 month. EXAM: CT ABDOMEN AND PELVIS WITHOUT CONTRAST TECHNIQUE: Multidetector CT imaging of the abdomen and pelvis was performed following the standard protocol without IV contrast. COMPARISON:  November 06, 2017. FINDINGS: Lower chest: No acute abnormality. Hepatobiliary: No focal liver abnormality is seen. Status post cholecystectomy. No biliary dilatation. Pancreas: Unremarkable. No pancreatic ductal dilatation or surrounding inflammatory changes. Spleen: Normal in size without focal abnormality. Adrenals/Urinary Tract: Adrenal glands are unremarkable. Kidneys are normal, without renal calculi, focal lesion, or hydronephrosis. Bladder is unremarkable. Stomach/Bowel: Stomach is unremarkable. The appendix is not visualized. Diverticulosis of the sigmoid colon is noted without inflammation. The colon is nondilated. However, there is interval development of pneumatosis involving small bowel loops without inflammatory change and minimal proximal small bowel dilatation. Vascular/Lymphatic: Aortic atherosclerosis. No enlarged abdominal or pelvic lymph nodes. Reproductive: Prostate is unremarkable. Other: Bilateral fat containing inguinal hernias are again noted. No ascites is noted. Musculoskeletal: No acute or significant osseous findings. IMPRESSION: Interval development of extensive pneumatosis involving small bowel loops without definite inflammatory change. Minimal proximal small bowel dilatation is noted. This may simply represent benign pneumatosis, but ischemic bowel can not be excluded although felt to be unlikely. Clinical correlation is recommended. Differential diagnosis  for benign pneumatosis can include pulmonary disease, intestinal inflammation iatrogenic due to procedures or certain medications. Critical Value/emergent results were called by telephone at the time of interpretation on 12/28/2020 at 7:21 pm to provider [facility] ZAMMIT , who verbally acknowledged these results. Electronically Signed   By: Marijo Conception M.D.   On: 12/28/2020 19:21   DG Chest Port 1 View  Result Date: 12/29/2020 CLINICAL DATA:  60 year old male central line placement. EXAM: PORTABLE CHEST 1 VIEW COMPARISON:  12/24/2020 chest radiographs. FINDINGS: Portable AP upright view at 0752 hours. Left IJ approach central line in place, tip at the level of the lower SVC, about 1 vertebral body height below the carina. No pneumothorax. Mediastinal contours remain normal. Lung ventilation is stable and within normal limits. No acute osseous abnormality identified. Negative visible bowel gas. IMPRESSION: Left IJ approach central line placed with tip at the lower SVC level. No pneumothorax or adverse features. Electronically Signed   By: Genevie Ann M.D.   On: 12/29/2020 08:19  Scheduled Meds:  Chlorhexidine Gluconate Cloth  6 each Topical Q0600   influenza vac split quadrivalent PF  0.5 mL Intramuscular Tomorrow-1000   insulin aspart  0-15 Units Subcutaneous Q4H   [START ON 12/31/2020] levothyroxine  25 mcg Intravenous Daily   pantoprazole (PROTONIX) IV  40 mg Intravenous QHS   Continuous Infusions:  sodium chloride Stopped (12/29/20 0143)   norepinephrine (LEVOPHED) Adult infusion 5 mcg/min (12/30/20 0953)   piperacillin-tazobactam (ZOSYN)  IV Stopped (12/30/20 0622)   sodium bicarbonate in 0.45 NS mL infusion Stopped (12/30/20 0839)   vasopressin Stopped (12/29/20 0622)     LOS: 2 days    Time spent: 35 minutes    Donovon Micheletti Darleen Crocker, DO [facility]  If 7PM-7AM, please contact night-coverage [email or web address] 12/30/2020, 10:24 AM

## 2020-12-30 NOTE — Progress Notes (Signed)
[facility] Progress Note     Subjective: Feeling better, ambulating, still some soreness. Wants to eat. Had a BM that is dark. Remains on levo around 5 mcg, creatinine improved and K improved.   Objective: Vital signs in last 24 hours: Temp:  [97.6 F (36.4 C)-97.9 F (36.6 C)] 97.7 F (36.5 C) (09/25 1100) Pulse Rate:  [58-91] 71 (09/25 1300) Resp:  [12-24] 17 (09/25 1300) BP: (76-109)/(32-60) 91/46 (09/25 1300) SpO2:  [90 %-99 %] 97 % (09/25 1300) Arterial Line BP: (71-118)/(40-76) 111/58 (09/25 1300) Weight:  [124.7 kg] 124.7 kg (09/25 0500) Last BM Date: 12/29/20  Intake/Output from previous day: 09/24 0701 - 09/25 0700 In: 3523.7 [I.V.:3386.8; IV Piggyback:136.9] Out: 4650 [Urine:4650] Intake/Output this shift: Total I/O In: 1504.6 [I.V.:1404.7; IV Piggyback:100] Out: 850 [Urine:850]  General appearance: alert and no distress Resp: normal work of breathing GI: soft, nondistended, mildly tender  Lab Results:  Recent Labs    12/29/20 1901 12/30/20 0400  WBC 11.1* 12.0*  HGB 11.3* 10.8*  HCT 32.1* 31.8*  PLT 184 204   BMET Recent Labs    12/29/20 1901 12/30/20 0400  NA 129* 129*  K 4.9 4.5  CL 104 102  CO2 19* 20*  GLUCOSE 99 121*  BUN 73* 59*  CREATININE 2.00* 1.64*  CALCIUM 8.5* 8.1*   PT/INR No results for input(s): LABPROT, INR in the last 72 hours.  Studies/Results: CT ABDOMEN PELVIS WO CONTRAST  Result Date: 12/29/2020 CLINICAL DATA:  Abdominal pain, follow-up pneumatosis EXAM: CT ABDOMEN AND PELVIS WITHOUT CONTRAST TECHNIQUE: Multidetector CT imaging of the abdomen and pelvis was performed following the standard protocol without IV contrast. COMPARISON:  12/28/2020 FINDINGS: Lower chest: No acute abnormality. Hepatobiliary: No solid liver abnormality is seen. No gallstones, gallbladder wall thickening, or biliary dilatation. Pancreas: Unremarkable. No pancreatic ductal dilatation or surrounding inflammatory changes. Spleen:  Normal in size without significant abnormality. Adrenals/Urinary Tract: Adrenal glands are unremarkable. Kidneys are normal, without renal calculi, solid lesion, or hydronephrosis. Bladder is unremarkable. Stomach/Bowel: Stomach is within normal limits. There is redemonstrated, diffuse pneumatosis of the small bowel, involving almost the entirety of its length, and similar in appearance to prior examination. Oral enteric contrast has transited to the cecum. No distension or inflammatory thickening of bowel wall. Appendix appears diminutive although normal. Sigmoid diverticulosis. Vascular/Lymphatic: Aortic atherosclerosis. No enlarged abdominal or pelvic lymph nodes. Reproductive: No mass or other significant abnormality. Other: Fat containing bilateral inguinal hernias. No abdominopelvic ascites. Musculoskeletal: No acute or significant osseous findings. IMPRESSION: 1. There is redemonstrated, diffuse pneumatosis of the small bowel, involving almost the entirety of its length, and similar in appearance to prior examination. Oral enteric contrast has transited to the cecum, and there is no distention or inflammatory thickening of bowel wall. Findings favor benign pneumatosis intestinalis. 2.  Sigmoid diverticulosis. Aortic Atherosclerosis (ICD10-I70.0). Electronically Signed   By: Eddie Candle M.D.   On: 12/29/2020 13:20   CT ABDOMEN PELVIS WO CONTRAST  Result Date: 12/28/2020 CLINICAL DATA:  Epigastric abdominal pain for 1 month. EXAM: CT ABDOMEN AND PELVIS WITHOUT CONTRAST TECHNIQUE: Multidetector CT imaging of the abdomen and pelvis was performed following the standard protocol without IV contrast. COMPARISON:  November 06, 2017. FINDINGS: Lower chest: No acute abnormality. Hepatobiliary: No focal liver abnormality is seen. Status post cholecystectomy. No biliary dilatation. Pancreas: Unremarkable. No pancreatic ductal dilatation or surrounding inflammatory changes. Spleen: Normal in size without focal  abnormality. Adrenals/Urinary Tract: Adrenal glands are unremarkable. Kidneys are normal, without renal calculi, focal  lesion, or hydronephrosis. Bladder is unremarkable. Stomach/Bowel: Stomach is unremarkable. The appendix is not visualized. Diverticulosis of the sigmoid colon is noted without inflammation. The colon is nondilated. However, there is interval development of pneumatosis involving small bowel loops without inflammatory change and minimal proximal small bowel dilatation. Vascular/Lymphatic: Aortic atherosclerosis. No enlarged abdominal or pelvic lymph nodes. Reproductive: Prostate is unremarkable. Other: Bilateral fat containing inguinal hernias are again noted. No ascites is noted. Musculoskeletal: No acute or significant osseous findings. IMPRESSION: Interval development of extensive pneumatosis involving small bowel loops without definite inflammatory change. Minimal proximal small bowel dilatation is noted. This may simply represent benign pneumatosis, but ischemic bowel can not be excluded although felt to be unlikely. Clinical correlation is recommended. Differential diagnosis for benign pneumatosis can include pulmonary disease, intestinal inflammation iatrogenic due to procedures or certain medications. Critical Value/emergent results were called by telephone at the time of interpretation on 12/28/2020 at 7:21 pm to provider [facility] ZAMMIT , who verbally acknowledged these results. Electronically Signed   By: Marijo Conception M.D.   On: 12/28/2020 19:21   DG Chest Port 1 View  Result Date: 12/29/2020 CLINICAL DATA:  60 year old male central line placement. EXAM: PORTABLE CHEST 1 VIEW COMPARISON:  12/24/2020 chest radiographs. FINDINGS: Portable AP upright view at 0752 hours. Left IJ approach central line in place, tip at the level of the lower SVC, about 1 vertebral body height below the carina. No pneumothorax. Mediastinal contours remain normal. Lung ventilation is stable and within normal  limits. No acute osseous abnormality identified. Negative visible bowel gas. IMPRESSION: Left IJ approach central line placed with tip at the lower SVC level. No pneumothorax or adverse features. Electronically Signed   By: Genevie Ann M.D.   On: 12/29/2020 08:19    Anti-infectives: Anti-infectives (From admission, onward)    Start     Dose/Rate Route Frequency Ordered Stop   12/29/20 1600  piperacillin-tazobactam (ZOSYN) IVPB 3.375 g        3.375 g 12.5 mL/hr over 240 Minutes Intravenous Every 8 hours 12/29/20 0735     12/29/20 0800  piperacillin-tazobactam (ZOSYN) IVPB 3.375 g        3.375 g 100 mL/hr over 30 Minutes Intravenous  Once 12/29/20 0734 12/29/20 0949   12/28/20 2100  metroNIDAZOLE (FLAGYL) IVPB 500 mg  Status:  Discontinued        500 mg 100 mL/hr over 60 Minutes Intravenous Every 12 hours 12/28/20 2059 12/29/20 0734       Assessment/Plan: Mr. Westfall is a 61 yo with pneumatosis intestinalis of unknown etiology, had been on ozempic shots and dehydrated, acute kidney injury potentially from poor intake. Doing better. No signs of ischemia on repeat CT and favored benign pneumatosis. Monitoring closely.  Will likely repeat CT in next 24-48 hours, if do IV contrast will plan to premedicate  NPO strict IV antibiotics Bicarb gtt still running, bicarb trending up, Creatinine down, urinating well  Hold CPAP again tonight     LOS: 2 days    Virl Cagey 12/30/2020

## 2020-12-31 ENCOUNTER — Inpatient Hospital Stay (HOSPITAL_COMMUNITY): Payer: 59

## 2020-12-31 DIAGNOSIS — K6389 Other specified diseases of intestine: Secondary | ICD-10-CM | POA: Diagnosis not present

## 2020-12-31 LAB — BASIC METABOLIC PANEL
Anion gap: 8 (ref 5–15)
BUN: 40 mg/dL — ABNORMAL HIGH (ref 8–23)
CO2: 24 mmol/L (ref 22–32)
Calcium: 8.4 mg/dL — ABNORMAL LOW (ref 8.9–10.3)
Chloride: 100 mmol/L (ref 98–111)
Creatinine, Ser: 1.58 mg/dL — ABNORMAL HIGH (ref 0.61–1.24)
GFR, Estimated: 49 mL/min — ABNORMAL LOW (ref >60)
Glucose, Bld: 96 mg/dL (ref 70–99)
Potassium: 4.5 mmol/L (ref 3.5–5.1)
Sodium: 132 mmol/L — ABNORMAL LOW (ref 135–145)

## 2020-12-31 LAB — CBC
HCT: 32 % — ABNORMAL LOW (ref 39.0–52.0)
Hemoglobin: 11.1 g/dL — ABNORMAL LOW (ref 13.0–17.0)
MCH: 33.2 pg (ref 26.0–34.0)
MCHC: 34.7 g/dL (ref 30.0–36.0)
MCV: 95.8 fL (ref 80.0–100.0)
Platelets: 189 10*3/uL (ref 150–400)
RBC: 3.34 MIL/uL — ABNORMAL LOW (ref 4.22–5.81)
RDW: 12.6 % (ref 11.5–15.5)
WBC: 10.5 10*3/uL (ref 4.0–10.5)
nRBC: 0 % (ref 0.0–0.2)

## 2020-12-31 LAB — GLUCOSE, CAPILLARY
Glucose-Capillary: 68 mg/dL — ABNORMAL LOW (ref 70–99)
Glucose-Capillary: 74 mg/dL (ref 70–99)
Glucose-Capillary: 75 mg/dL (ref 70–99)
Glucose-Capillary: 80 mg/dL (ref 70–99)
Glucose-Capillary: 86 mg/dL (ref 70–99)
Glucose-Capillary: 89 mg/dL (ref 70–99)
Glucose-Capillary: 89 mg/dL (ref 70–99)
Glucose-Capillary: 91 mg/dL (ref 70–99)

## 2020-12-31 LAB — MAGNESIUM: Magnesium: 2.1 mg/dL (ref 1.7–2.4)

## 2020-12-31 LAB — HEMOGLOBIN A1C
Hgb A1c MFr Bld: 6.2 % — ABNORMAL HIGH (ref 4.8–5.6)
Mean Plasma Glucose: 131 mg/dL

## 2020-12-31 MED ORDER — SODIUM CHLORIDE 0.9 % IV SOLN: INTRAVENOUS | Status: DC

## 2020-12-31 NOTE — Progress Notes (Signed)
[facility]  Trace residual pneumatosis. Off levo. Can have clears, go slow, stop if worsening pain.  Curlene Labrum, MD

## 2020-12-31 NOTE — Progress Notes (Signed)
PROGRESS NOTE    Nicholas Gray  XBM:[id number] DOB: [birth year] DOA: 12/28/2020 PCP: [place] [facility]    Brief Narrative: Nicholas Gray presented with significant prerenal aki. Hx of HFrEF, gerd, htn, hld, hypothyroidism, and osa. He reports staring ozempic mid august, reports abdominal pain and difficulty eating, a feeling of 'stuck' food when he ate or drank liquids. He reports he continued for 3 weeks to use the ozempic, then ceasing due to the inability and discomfort with ingestion. He reports 20 lbs weight loss due to lack of ingestion. He was noted to be dehydrated with a significant prerenal aki, hyperkalemia, metabolic acidosis, and pneumatosis intestinalis.    Assessment & Plan:   Principal Problem:    Pneumatosis intestinalis -zosyn to cover concern of perforation -possibly secondary to ozempic -General Surgery and GI consulted for recommendations -repeat abd ct when aki / dehydration resolve to evaluate the need for further intervention  Active Problems:    Essential hypertension -holding home antihypertensives while correcting volume and electrolytes    Dilated cardiomyopathy -hold asa -hold home antihypertensives    LBBB (left bundle branch block) -chronic, monitoring    OSA (obstructive sleep apnea) -cpap at night, hx of using it for years    Chronic systolic CHF (congestive heart failure) -holding home antihypertensives until fluid status and electrolyte status improve    Hyponatremia -IV NS, improving from 127 9/24 to 132 9/26 -strict NPO     Hyperkalemia -improving with hydration, 6.1 9/24, 4.5 9/26 -secondary to prerenal aki    Acute renal injury -prerenal in origin, improving with hydration -avoiding nephrotoxic meds -holding home antihypertensives    Hypothyroidism -levothyroxine 3mcg iv qd     DVT prophylaxis: scd's Code Status: full Family Communication: wife in room Disposition Plan: plan to eventually dc  patient home, after pressors are no longer required, fluid and electrolyte status are resolved, and concerns over pneumatosis intestinalis are addressed. Consultants:  Gastroenterology General Surgery     Antimicrobials: Zosyn 3.375g iv q8hrs  Subjective: Patient is ambulatory, reports a well documented timeline of events, improving.  Objective: Vitals:   12/31/20 1015 12/31/20 1030 12/31/20 1045 12/31/20 1100  BP: (!) 105/53 (!) 85/63 (!) 97/53 116/67  Pulse: 73 74 69 71  Resp: 15 (!) 30 13 17   Temp:    97.9 F (36.6 C)  TempSrc:    Oral  SpO2: 95% 97% 99% 97%  Weight:      Height:        Intake/Output Summary (Last 24 hours) at 12/31/2020 1352 Last data filed at 12/31/2020 0909 Gross per 24 hour  Intake 2670.37 ml  Output 1025 ml  Net 1645.37 ml   Filed Weights   12/28/20 1556 12/30/20 0500  Weight: 124.7 kg 124.7 kg    Examination:  General exam: Appears calm and comfortable  Respiratory system: Clear to auscultation. Respiratory effort normal. Cardiovascular system: S1 & S2 heard, RRR. No JVD, murmurs, rubs, gallops or clicks. No pedal edema. Gastrointestinal system: Abdomen is nondistended, soft and nontender. No organomegaly or masses felt. Normal bowel sounds heard. Central nervous system: Alert and oriented. No focal neurological deficits. Extremities: ambulatory, moves all extremities appropriately Skin: No obvious rashes, lesions or ulcers Psychiatry: Judgement and insight appear normal. Mood & affect appropriate.     Data Reviewed: I have personally reviewed following labs and imaging studies  CBC: Recent Labs  Lab 12/28/20 1620 12/29/20 0548 12/29/20 1901 12/30/20 0400 12/31/20 0410  WBC 15.5* 14.7* 11.1* 12.0* 10.5  NEUTROABS  --   --  7.1  --   --   HGB 13.0 11.4* 11.3* 10.8* 11.1*  HCT 37.5* 33.7* 32.1* 31.8* 32.0*  MCV 95.2 95.5 95.0 94.4 95.8  PLT 221 235 184 204 038   Basic Metabolic Panel: Recent Labs  Lab 12/29/20 0832  12/29/20 1230 12/29/20 1901 12/30/20 0400 12/31/20 0410  NA 130* 127* 129* 129* 132*  K 5.8* 5.4* 4.9 4.5 4.5  CL 107 104 104 102 100  CO2 15* 16* 19* 20* 24  GLUCOSE 104* 82 99 121* 96  BUN 95* 84* 73* 59* 40*  CREATININE 2.34* 2.21* 2.00* 1.64* 1.58*  CALCIUM 8.4* 8.5* 8.5* 8.1* 8.4*  MG  --   --   --  1.4* 2.1   GFR: Estimated Creatinine Clearance: 65.1 mL/min (A) (by C-G formula based on SCr of 1.58 mg/dL (H)). Liver Function Tests: Recent Labs  Lab 12/28/20 1620  AST 18  ALT 27  ALKPHOS 43  BILITOT 0.2*  PROT 7.6  ALBUMIN 4.1   Recent Labs  Lab 12/28/20 1620  LIPASE 67*   No results for input(s): AMMONIA in the last 168 hours. Coagulation Profile: No results for input(s): INR, PROTIME in the last 168 hours. Cardiac Enzymes: No results for input(s): CKTOTAL, CKMB, CKMBINDEX, TROPONINI in the last 168 hours. BNP (last 3 results) No results for input(s): PROBNP in the last 8760 hours. HbA1C: Recent Labs    12/28/20 2234  HGBA1C 6.2*   CBG: Recent Labs  Lab 12/30/20 2002 12/31/20 0019 12/31/20 0352 12/31/20 0733 12/31/20 1108  GLUCAP 83 91 89 86 80   Lipid Profile: No results for input(s): CHOL, HDL, LDLCALC, TRIG, CHOLHDL, LDLDIRECT in the last 72 hours. Thyroid Function Tests: Recent Labs    12/29/20 1212  TSH 1.033   Anemia Panel: No results for input(s): VITAMINB12, FOLATE, FERRITIN, TIBC, IRON, RETICCTPCT in the last 72 hours. Urine analysis:    Component Value Date/Time   COLORURINE STRAW (A) 12/28/2020 2233   APPEARANCEUR CLEAR 12/28/2020 2233   LABSPEC 1.010 12/28/2020 2233   PHURINE 5.0 12/28/2020 2233   GLUCOSEU >=500 (A) 12/28/2020 2233   HGBUR SMALL (A) 12/28/2020 2233   BILIRUBINUR NEGATIVE 12/28/2020 2233   KETONESUR NEGATIVE 12/28/2020 2233   PROTEINUR NEGATIVE 12/28/2020 2233   NITRITE NEGATIVE 12/28/2020 2233   LEUKOCYTESUR NEGATIVE 12/28/2020 2233   Sepsis Labs: @LABRCNTIP (procalcitonin:4,lacticidven:4)  ) Recent  Results (from the past 240 hour(s))  Resp Panel by RT-PCR (Flu A&B, Covid) Nasopharyngeal Swab     Status: None   Collection Time: 12/28/20  9:39 PM   Specimen: Nasopharyngeal Swab; Nasopharyngeal(NP) swabs in vial transport medium  Result Value Ref Range Status   SARS Coronavirus 2 by RT PCR NEGATIVE NEGATIVE Final    Comment: (NOTE) SARS-CoV-2 target nucleic acids are NOT DETECTED.  The SARS-CoV-2 RNA is generally detectable in upper respiratory specimens during the acute phase of infection. The lowest concentration of SARS-CoV-2 viral copies this assay can detect is 138 copies/mL. A negative result does not preclude SARS-Cov-2 infection and should not be used as the sole basis for treatment or other patient management decisions. A negative result may occur with  improper specimen collection/handling, submission of specimen other than nasopharyngeal swab, presence of viral mutation(s) within the areas targeted by this assay, and inadequate number of viral copies(<138 copies/mL). A negative result must be combined with clinical observations, patient history, and epidemiological information. The expected result is Negative.  Fact Sheet for Patients:  EntrepreneurPulse.com.au  Fact Sheet for Healthcare Providers:  IncredibleEmployment.be  This test is no t yet approved or cleared by the [place] FDA and  has been authorized for detection and/or diagnosis of SARS-CoV-2 by FDA under an Emergency Use Authorization (EUA). This EUA will remain  in effect (meaning this test can be used) for the duration of the COVID-19 declaration under Section 564(b)(1) of the Act, 21 U.S.C.section 360bbb-3(b)(1), unless the authorization is terminated  or revoked sooner.       Influenza A by PCR NEGATIVE NEGATIVE Final   Influenza B by PCR NEGATIVE NEGATIVE Final    Comment: (NOTE) The Xpert Xpress SARS-CoV-2/FLU/RSV plus assay is intended as an aid in the  diagnosis of influenza from Nasopharyngeal swab specimens and should not be used as a sole basis for treatment. Nasal washings and aspirates are unacceptable for Xpert Xpress SARS-CoV-2/FLU/RSV testing.  Fact Sheet for Patients: EntrepreneurPulse.com.au  Fact Sheet for Healthcare Providers: IncredibleEmployment.be  This test is not yet approved or cleared by the [place] FDA and has been authorized for detection and/or diagnosis of SARS-CoV-2 by FDA under an Emergency Use Authorization (EUA). This EUA will remain in effect (meaning this test can be used) for the duration of the COVID-19 declaration under Section 564(b)(1) of the Act, 21 U.S.C. section 360bbb-3(b)(1), unless the authorization is terminated or revoked.  Performed at [facility], [address], [place], [place] [address]   MRSA Next Gen by PCR, Nasal     Status: None   Collection Time: 12/28/20 11:59 PM   Specimen: Nasal Mucosa; Nasal Swab  Result Value Ref Range Status   MRSA by PCR Next Gen NOT DETECTED NOT DETECTED Final    Comment: (NOTE) The GeneXpert MRSA Assay (FDA approved for NASAL specimens only), is one component of a comprehensive MRSA colonization surveillance program. It is not intended to diagnose MRSA infection nor to guide or monitor treatment for MRSA infections. Test performance is not FDA approved in patients less than 17 years old. Performed at [facility], [address], [place], [place] [address]          Radiology Studies: No results found.      Scheduled Meds:  Chlorhexidine Gluconate Cloth  6 each Topical Q0600   insulin aspart  0-15 Units Subcutaneous Q4H   levothyroxine  25 mcg Intravenous Daily   pantoprazole (PROTONIX) IV  40 mg Intravenous QHS   Continuous Infusions:  sodium chloride 50 mL/hr at 12/31/20 0909   sodium chloride 100 mL/hr at 12/31/20 0931   norepinephrine (LEVOPHED) Adult infusion Stopped (12/31/20 1100)    piperacillin-tazobactam (ZOSYN)  IV 3.375 g (12/31/20 1046)   vasopressin Stopped (12/29/20 0622)     LOS: 3 days      Ellie Lunch, student [facility] Pager [phone]  If 7PM-7AM, please contact night-coverage [email or web address] Password [email or web address] 12/31/2020, 1:52 PM

## 2020-12-31 NOTE — Progress Notes (Signed)
[facility] Progress Note     Subjective: Reviewed imaging with Dr. Thornton Papas, agrees repeat Ct today. Does not think any IV contrast will show anything as the vessels are open and normal appearance and no major atherosclerotic disease, do no think CTA will be necessary.   Pain improved. Having Bms. Levo on at 71mcg. Cr improved.   Objective: Vital signs in last 24 hours: Temp:  [97.8 F (36.6 C)-98.5 F (36.9 C)] 97.9 F (36.6 C) (09/26 0700) Pulse Rate:  [62-88] 71 (09/26 1100) Resp:  [12-30] 17 (09/26 1100) BP: (85-122)/(46-93) 116/67 (09/26 1100) SpO2:  [87 %-99 %] 97 % (09/26 1100) Arterial Line BP: (87-118)/(50-96) 94/76 (09/25 1801) Last BM Date: 12/30/20  Intake/Output from previous day: 09/25 0701 - 09/26 0700 In: 3509 [I.V.:3271; IV Piggyback:238] Out: 1875 [Urine:1875] Intake/Output this shift: Total I/O In: 666 [I.V.:654.1; IV Piggyback:11.9] Out: -   General appearance: alert and no distress GI: soft, nondistended, nontender  Lab Results:  Recent Labs    12/30/20 0400 12/31/20 0410  WBC 12.0* 10.5  HGB 10.8* 11.1*  HCT 31.8* 32.0*  PLT 204 189   BMET Recent Labs    12/30/20 0400 12/31/20 0410  NA 129* 132*  K 4.5 4.5  CL 102 100  CO2 20* 24  GLUCOSE 121* 96  BUN 59* 40*  CREATININE 1.64* 1.58*  CALCIUM 8.1* 8.4*   PT/INR No results for input(s): LABPROT, INR in the last 72 hours.  Studies/Results: CT ABDOMEN PELVIS WO CONTRAST  Result Date: 12/29/2020 CLINICAL DATA:  Abdominal pain, follow-up pneumatosis EXAM: CT ABDOMEN AND PELVIS WITHOUT CONTRAST TECHNIQUE: Multidetector CT imaging of the abdomen and pelvis was performed following the standard protocol without IV contrast. COMPARISON:  12/28/2020 FINDINGS: Lower chest: No acute abnormality. Hepatobiliary: No solid liver abnormality is seen. No gallstones, gallbladder wall thickening, or biliary dilatation. Pancreas: Unremarkable. No pancreatic ductal dilatation or surrounding  inflammatory changes. Spleen: Normal in size without significant abnormality. Adrenals/Urinary Tract: Adrenal glands are unremarkable. Kidneys are normal, without renal calculi, solid lesion, or hydronephrosis. Bladder is unremarkable. Stomach/Bowel: Stomach is within normal limits. There is redemonstrated, diffuse pneumatosis of the small bowel, involving almost the entirety of its length, and similar in appearance to prior examination. Oral enteric contrast has transited to the cecum. No distension or inflammatory thickening of bowel wall. Appendix appears diminutive although normal. Sigmoid diverticulosis. Vascular/Lymphatic: Aortic atherosclerosis. No enlarged abdominal or pelvic lymph nodes. Reproductive: No mass or other significant abnormality. Other: Fat containing bilateral inguinal hernias. No abdominopelvic ascites. Musculoskeletal: No acute or significant osseous findings. IMPRESSION: 1. There is redemonstrated, diffuse pneumatosis of the small bowel, involving almost the entirety of its length, and similar in appearance to prior examination. Oral enteric contrast has transited to the cecum, and there is no distention or inflammatory thickening of bowel wall. Findings favor benign pneumatosis intestinalis. 2.  Sigmoid diverticulosis. Aortic Atherosclerosis (ICD10-I70.0). Electronically Signed   By: Eddie Candle M.D.   On: 12/29/2020 13:20    Anti-infectives: Anti-infectives (From admission, onward)    Start     Dose/Rate Route Frequency Ordered Stop   12/29/20 1600  piperacillin-tazobactam (ZOSYN) IVPB 3.375 g        3.375 g 12.5 mL/hr over 240 Minutes Intravenous Every 8 hours 12/29/20 0735     12/29/20 0800  piperacillin-tazobactam (ZOSYN) IVPB 3.375 g        3.375 g 100 mL/hr over 30 Minutes Intravenous  Once 12/29/20 0734 12/29/20 0949   12/28/20 2100  metroNIDAZOLE (  FLAGYL) IVPB 500 mg  Status:  Discontinued        500 mg 100 mL/hr over 60 Minutes Intravenous Every 12 hours 12/28/20  2059 12/29/20 0734       Assessment/Plan: Patient with pneumatosis intestinalis of unknown etiology. Doing better. Labs all better. AKI improving. Hco3 normalized. K noramlized.  CT with oral contrast only today Will decide about diet based on CT Levo with map goal >60, hopefully can wean off  Continue antibiotics for now   LOS: 3 days    Virl Cagey 12/31/2020

## 2020-12-31 NOTE — Evaluation (Signed)
Physical Therapy Evaluation Patient Details Name: Nicholas Gray MRN: [id number] DOB: Oct 15, 1960 Today's Date: 12/31/2020  History of Present Illness  Nicholas Gray is a 60 y.o. male with a history of cardiomyopathy and HFrEF with EF of 45% on echo 11/2020, GERD, hypertension, hyperlipidemia, hypothyroidism, history of cholecystectomy in 2019.  Due to obesity and hyperlipidemia, the patient was placed on Ozempic around 8/18 shortly afterwards, the patient started having abdominal pain with symptoms of food or liquid getting stuck in his esophagus whenever he eats or drinks anything.  The symptoms have been worsening to the point where he does not want to eat or drink anything.  In addition, the patient has mild constant pain just superior to his umbilicus.  He does have an increase in this pain mildly when he eats or drinks.  No other palliating or provoking factors.  He denies fevers but does feel quite cold and having some shaking.  Denies chest pain, shortness of breath.  Has been having loose stools.  No blood in his stools.   Clinical Impression  Patient functioning near baseline for functional mobility and gait demonstrating good return for ambulation in room and hallways without loss of balance.  Patient encouraged to ambulate in room ad lib and with nursing staff supervising in hallway.  Plan:  Patient discharged from physical therapy to care of nursing for ambulation daily as tolerated for length of stay.         Recommendations for follow up therapy are one component of a multi-disciplinary discharge planning process, led by the attending physician.  Recommendations may be updated based on patient status, additional functional criteria and insurance authorization.  Follow Up Recommendations No PT follow up;Supervision - Intermittent    Equipment Recommendations  None recommended by PT    Recommendations for Other Services       Precautions / Restrictions  Precautions Precautions: None Restrictions Weight Bearing Restrictions: No      Mobility  Bed Mobility Overal bed mobility: Modified Independent                  Transfers Overall transfer level: Modified independent                  Ambulation/Gait Ambulation/Gait assistance: Modified independent (Device/Increase time);Supervision Gait Distance (Feet): 100 Feet Assistive device: None Gait Pattern/deviations: WFL(Within Functional Limits) Gait velocity: decreased   General Gait Details: slightly labored cadence with good return for ambulation in room and hallway without loss of balance  Stairs            Wheelchair Mobility    Modified Rankin (Stroke Patients Only)       Balance Overall balance assessment: No apparent balance deficits (not formally assessed)                                           Pertinent Vitals/Pain Pain Assessment: No/denies pain    Home Living Family/patient expects to be discharged to:: Private residence Living Arrangements: Spouse/significant other Available Help at Discharge: Family;Available 24 hours/day Type of Home: House Home Access: Ramped entrance     Home Layout: One level Home Equipment: Walker - 2 wheels;Wheelchair - manual;Cane - single point;Bedside commode;Tub bench      Prior Function Level of Independence: Independent         Comments: Hydrographic surveyor, drives     Hand Dominance  Extremity/Trunk Assessment   Upper Extremity Assessment Upper Extremity Assessment: Overall WFL for tasks assessed    Lower Extremity Assessment Lower Extremity Assessment: Overall WFL for tasks assessed    Cervical / Trunk Assessment Cervical / Trunk Assessment: Normal  Communication   Communication: No difficulties  Cognition Arousal/Alertness: Awake/alert Behavior During Therapy: WFL for tasks assessed/performed Overall Cognitive Status: Within Functional Limits for  tasks assessed                                        General Comments      Exercises     Assessment/Plan    PT Assessment Patent does not need any further PT services  PT Problem List         PT Treatment Interventions      PT Goals (Current goals can be found in the Care Plan section)  Acute Rehab PT Goals Patient Stated Goal: return home with family to assist PT Goal Formulation: With patient Time For Goal Achievement: 12/31/20 Potential to Achieve Goals: Good    Frequency     Barriers to discharge        Co-evaluation               AM-PAC PT "6 Clicks" Mobility  Outcome Measure Help needed turning from your back to your side while in a flat bed without using bedrails?: None Help needed moving from lying on your back to sitting on the side of a flat bed without using bedrails?: None Help needed moving to and from a bed to a chair (including a wheelchair)?: None Help needed standing up from a chair using your arms (e.g., wheelchair or bedside chair)?: None Help needed to walk in hospital room?: None Help needed climbing 3-5 steps with a railing? : A Little 6 Click Score: 23    End of Session   Activity Tolerance: Patient tolerated treatment well Patient left: in chair;with call bell/phone within reach;with family/visitor present Nurse Communication: Mobility status PT Visit Diagnosis: Unsteadiness on feet (R26.81);Other abnormalities of gait and mobility (R26.89);Muscle weakness (generalized) (M62.81)    Time: 1594-7076 PT Time Calculation (min) (ACUTE ONLY): 20 min   Charges:   PT Evaluation $PT Eval Moderate Complexity: 1 Mod PT Treatments $Therapeutic Activity: 8-22 mins        9:44 AM, 12/31/20 Lonell Grandchild, MPT Physical Therapist with [facility] [phone] office [phone] mobile phone

## 2020-12-31 NOTE — Progress Notes (Signed)
Subjective: No abdominal pain. Levophed just stopped by nursing at 11am. No overt GI bleeding. No N/V. Does report 2 week history of dysphagia. Still present with liquids as well. Feels like food/liquid hit a brick wall in his sternum. Sitting up in chair.   Objective: Vital signs in last 24 hours: Temp:  [97.7 F (36.5 C)-98.5 F (36.9 C)] 97.9 F (36.6 C) (09/26 0700) Pulse Rate:  [62-88] 69 (09/26 1045) Resp:  [12-30] 13 (09/26 1045) BP: (85-122)/(46-93) 97/53 (09/26 1045) SpO2:  [87 %-99 %] 99 % (09/26 1045) Arterial Line BP: (87-118)/(50-96) 94/76 (09/25 1801) Last BM Date: 12/30/20 General:   Alert and oriented, pleasant Abdomen:  Bowel sounds present, soft, non-tender, non-distended. Obese. Sitting up in chair Msk:  Symmetrical without gross deformities. Normal posture. Pulses:  Normal pulses noted. Extremities:  Without edema. Neurologic:  Alert and  oriented x4   Intake/Output from previous day: 09/25 0701 - 09/26 0700 In: 3509 [I.V.:3271; IV Piggyback:238] Out: 7673 [Urine:1875] Intake/Output this shift: Total I/O In: 666 [I.V.:654.1; IV Piggyback:11.9] Out: -   Lab Results: Recent Labs    12/29/20 1901 12/30/20 0400 12/31/20 0410  WBC 11.1* 12.0* 10.5  HGB 11.3* 10.8* 11.1*  HCT 32.1* 31.8* 32.0*  PLT 184 204 189   BMET Recent Labs    12/29/20 1901 12/30/20 0400 12/31/20 0410  NA 129* 129* 132*  K 4.9 4.5 4.5  CL 104 102 100  CO2 19* 20* 24  GLUCOSE 99 121* 96  BUN 73* 59* 40*  CREATININE 2.00* 1.64* 1.58*  CALCIUM 8.5* 8.1* 8.4*   LFT Recent Labs    12/28/20 1620  PROT 7.6  ALBUMIN 4.1  AST 18  ALT 27  ALKPHOS 43  BILITOT 0.2*    Studies/Results: CT ABDOMEN PELVIS WO CONTRAST  Result Date: 12/29/2020 CLINICAL DATA:  Abdominal pain, follow-up pneumatosis EXAM: CT ABDOMEN AND PELVIS WITHOUT CONTRAST TECHNIQUE: Multidetector CT imaging of the abdomen and pelvis was performed following the standard protocol without IV contrast.  COMPARISON:  12/28/2020 FINDINGS: Lower chest: No acute abnormality. Hepatobiliary: No solid liver abnormality is seen. No gallstones, gallbladder wall thickening, or biliary dilatation. Pancreas: Unremarkable. No pancreatic ductal dilatation or surrounding inflammatory changes. Spleen: Normal in size without significant abnormality. Adrenals/Urinary Tract: Adrenal glands are unremarkable. Kidneys are normal, without renal calculi, solid lesion, or hydronephrosis. Bladder is unremarkable. Stomach/Bowel: Stomach is within normal limits. There is redemonstrated, diffuse pneumatosis of the small bowel, involving almost the entirety of its length, and similar in appearance to prior examination. Oral enteric contrast has transited to the cecum. No distension or inflammatory thickening of bowel wall. Appendix appears diminutive although normal. Sigmoid diverticulosis. Vascular/Lymphatic: Aortic atherosclerosis. No enlarged abdominal or pelvic lymph nodes. Reproductive: No mass or other significant abnormality. Other: Fat containing bilateral inguinal hernias. No abdominopelvic ascites. Musculoskeletal: No acute or significant osseous findings. IMPRESSION: 1. There is redemonstrated, diffuse pneumatosis of the small bowel, involving almost the entirety of its length, and similar in appearance to prior examination. Oral enteric contrast has transited to the cecum, and there is no distention or inflammatory thickening of bowel wall. Findings favor benign pneumatosis intestinalis. 2.  Sigmoid diverticulosis. Aortic Atherosclerosis (ICD10-I70.0). Electronically Signed   By: Eddie Candle M.D.   On: 12/29/2020 13:20    Assessment: 60 year old male with multiple comorbidities presenting with anorexia, weight loss, abdominal pain, dysphagia and odynophagia, found to have extensive pneumatosis of the small bowel, acute renal injury, circulatory shock and required Levophed. Appears symptoms started  after starting Ozempic.    Levophed stopped at 11am. Clinically improving. He is actually sitting up in the chair this morning when seen. He has a pending CT for today and still noting dysphagia with liquids.   Diet advancement per surgery. Recommend EGD prior to discharge. CTA abdomen would also be helpful in the future.     Plan: PPI daily Avoid Ozempic EGD prior to discharge CT today pending CTA in future Will reassess in am   Annitta Needs, PhD, ANP-BC [facility]    LOS: 3 days    12/31/2020, 10:57 AM

## 2021-01-01 DIAGNOSIS — R1310 Dysphagia, unspecified: Secondary | ICD-10-CM | POA: Diagnosis not present

## 2021-01-01 DIAGNOSIS — K6389 Other specified diseases of intestine: Secondary | ICD-10-CM | POA: Diagnosis not present

## 2021-01-01 LAB — CBC
HCT: 27 % — ABNORMAL LOW (ref 39.0–52.0)
Hemoglobin: 9.2 g/dL — ABNORMAL LOW (ref 13.0–17.0)
MCH: 32.9 pg (ref 26.0–34.0)
MCHC: 34.1 g/dL (ref 30.0–36.0)
MCV: 96.4 fL (ref 80.0–100.0)
Platelets: 130 10*3/uL — ABNORMAL LOW (ref 150–400)
RBC: 2.8 MIL/uL — ABNORMAL LOW (ref 4.22–5.81)
RDW: 12.3 % (ref 11.5–15.5)
WBC: 7.4 10*3/uL (ref 4.0–10.5)
nRBC: 0 % (ref 0.0–0.2)

## 2021-01-01 LAB — BASIC METABOLIC PANEL
Anion gap: 6 (ref 5–15)
BUN: 23 mg/dL (ref 8–23)
CO2: 25 mmol/L (ref 22–32)
Calcium: 7.8 mg/dL — ABNORMAL LOW (ref 8.9–10.3)
Chloride: 100 mmol/L (ref 98–111)
Creatinine, Ser: 1.2 mg/dL (ref 0.61–1.24)
GFR, Estimated: >60 mL/min (ref >60)
Glucose, Bld: 79 mg/dL (ref 70–99)
Potassium: 4.3 mmol/L (ref 3.5–5.1)
Sodium: 131 mmol/L — ABNORMAL LOW (ref 135–145)

## 2021-01-01 LAB — MAGNESIUM: Magnesium: 1.7 mg/dL (ref 1.7–2.4)

## 2021-01-01 LAB — GLUCOSE, CAPILLARY
Glucose-Capillary: 76 mg/dL (ref 70–99)
Glucose-Capillary: 86 mg/dL (ref 70–99)
Glucose-Capillary: 93 mg/dL (ref 70–99)
Glucose-Capillary: 151 mg/dL — ABNORMAL HIGH (ref 70–99)
Glucose-Capillary: 75 mg/dL (ref 70–99)

## 2021-01-01 MED ORDER — VITAL 1.5 CAL PO LIQD
237.0000 mL | Freq: Every day | ORAL | Status: DC
  Administered 2021-01-01 – 2021-01-03 (×10): 237 mL via ORAL

## 2021-01-01 MED ORDER — PROSOURCE PLUS PO LIQD
30.0000 mL | Freq: Three times a day (TID) | ORAL | Status: DC
  Administered 2021-01-01 – 2021-01-03 (×5): 30 mL via ORAL
  Filled 2021-01-01 (×5): qty 30

## 2021-01-01 NOTE — Progress Notes (Signed)
PROGRESS NOTE    Nicholas Gray  XHB:[id number] DOB: [birth year] DOA: 12/28/2020 PCP: [place]    Brief Narrative: Nicholas Gray presented with a prerenal aki after several weeks of decreased oral intake. Hx of HFrEF, gerd, htn, hld, hypothyroidism, and osa. He reports staring ozempic mid august, reports abdominal pain and difficulty eating, a feeling of 'stuck' food when he ate or drank liquids. He reports 20 lbs weight loss due to lack of ingestion. He was noted to be severely dehydrated with a significant prerenal aki, hyperkalemia, metabolic acidosis, and pneumatosis intestinalis.   Assessment & Plan:   Principal Problem:   AKI with multiple electrolyte disturbances -initial metabolic acidosis has resolved -ceased bicarb drip -SIADH resolved -iv fluid normal saline continues as current sodium is 131 vs 9/23 126   Active Problems:  Pneumatosis intestinalis -Repeated ct scan shows marked improvement 9/27 -Plans for elemental diet -risk of perforation has reduced enough to cease zosyn 9/27  Circulatory shock -resolved -stopped norepinephrine 9/27  Hyponatremia - 9/27 131, 9/23 126 -liquid diet -continued iv normal saline    Hyperkalemia -secondary to prerenal aki -9/23 5.9, 9/27 4.3 -resolved    Essential hypertension -hold home antihypertensives for now, improved circulatory status but not resolved    Dilated cardiomyopathy ([place]) -hold asa -hold home antihypertensives    LBBB (left bundle branch block) -chronic, monitored    OSA (obstructive sleep apnea) -cpap used at night    Chronic systolic CHF (congestive heart failure) -holding home antihypertensives, volume and electrolyte status improving, but not resolved  Hypothyroidism -levothyroxine 60mcg iv qd     DVT prophylaxis: scd's Code Status: full Family Communication: wife at bedside Disposition Plan: home once medically stable   Consultants:  General  surgery gastroenterology   Antimicrobials: zosyn, stopped 9/27   Subjective: Reports he feels improved, reports bowel movements,  Objective: Vitals:   01/01/21 1100 01/01/21 1203 01/01/21 1204 01/01/21 1418  BP:    123/65  Pulse:      Resp: 15 18  (!) 23  Temp:   98.4 F (36.9 C)   TempSrc:   Oral   SpO2:    100%  Weight:      Height:        Intake/Output Summary (Last 24 hours) at 01/01/2021 1624 Last data filed at 01/01/2021 1512 Gross per 24 hour  Intake 2802.85 ml  Output 1501 ml  Net 1301.85 ml   Filed Weights   12/28/20 1556 12/30/20 0500 01/01/21 0406  Weight: 124.7 kg 124.7 kg 116.8 kg    Examination:  General exam: Appears calm and comfortable, conversational Respiratory system: Clear to auscultation. Respiratory effort normal. Cardiovascular system: S1 & S2 heard, RRR. No JVD, murmurs, rubs, gallops or clicks. No pedal edema. Gastrointestinal system: Abdomen is nondistended, soft and nontender. No organomegaly or masses felt. Normal bowel sounds heard. Central nervous system: Alert and oriented. No focal neurological deficits. Extremities: ambulatory, moves all extremities appropriately Skin: No obvious rashes, lesions or ulcers Psychiatry: Judgement and insight appear normal. Mood & affect appropriate.     Data Reviewed: I have personally reviewed following labs and imaging studies  CBC: Recent Labs  Lab 12/29/20 0548 12/29/20 1901 12/30/20 0400 12/31/20 0410 01/01/21 0446  WBC 14.7* 11.1* 12.0* 10.5 7.4  NEUTROABS  --  7.1  --   --   --   HGB 11.4* 11.3* 10.8* 11.1* 9.2*  HCT 33.7* 32.1* 31.8* 32.0* 27.0*  MCV 95.5 95.0 94.4 95.8 96.4  PLT 235  184 204 189 947*   Basic Metabolic Panel: Recent Labs  Lab 12/29/20 1230 12/29/20 1901 12/30/20 0400 12/31/20 0410 01/01/21 0446  NA 127* 129* 129* 132* 131*  K 5.4* 4.9 4.5 4.5 4.3  CL 104 104 102 100 100  CO2 16* 19* 20* 24 25  GLUCOSE 82 99 121* 96 79  BUN 84* 73* 59* 40* 23   CREATININE 2.21* 2.00* 1.64* 1.58* 1.20  CALCIUM 8.5* 8.5* 8.1* 8.4* 7.8*  MG  --   --  1.4* 2.1 1.7   GFR: Estimated Creatinine Clearance: 82.7 mL/min (by C-G formula based on SCr of 1.2 mg/dL). Liver Function Tests: Recent Labs  Lab 12/28/20 1620  AST 18  ALT 27  ALKPHOS 43  BILITOT 0.2*  PROT 7.6  ALBUMIN 4.1   Recent Labs  Lab 12/28/20 1620  LIPASE 67*   No results for input(s): AMMONIA in the last 168 hours. Coagulation Profile: No results for input(s): INR, PROTIME in the last 168 hours. Cardiac Enzymes: No results for input(s): CKTOTAL, CKMB, CKMBINDEX, TROPONINI in the last 168 hours. BNP (last 3 results) No results for input(s): PROBNP in the last 8760 hours. HbA1C: No results for input(s): HGBA1C in the last 72 hours. CBG: Recent Labs  Lab 12/31/20 2337 01/01/21 0408 01/01/21 0733 01/01/21 1124 01/01/21 1600  GLUCAP 74 76 86 93 151*   Lipid Profile: No results for input(s): CHOL, HDL, LDLCALC, TRIG, CHOLHDL, LDLDIRECT in the last 72 hours. Thyroid Function Tests: No results for input(s): TSH, T4TOTAL, FREET4, T3FREE, THYROIDAB in the last 72 hours. Anemia Panel: No results for input(s): VITAMINB12, FOLATE, FERRITIN, TIBC, IRON, RETICCTPCT in the last 72 hours. Urine analysis:    Component Value Date/Time   COLORURINE STRAW (A) 12/28/2020 2233   APPEARANCEUR CLEAR 12/28/2020 2233   LABSPEC 1.010 12/28/2020 2233   PHURINE 5.0 12/28/2020 2233   GLUCOSEU >=500 (A) 12/28/2020 2233   HGBUR SMALL (A) 12/28/2020 2233   BILIRUBINUR NEGATIVE 12/28/2020 2233   KETONESUR NEGATIVE 12/28/2020 2233   PROTEINUR NEGATIVE 12/28/2020 2233   NITRITE NEGATIVE 12/28/2020 2233   LEUKOCYTESUR NEGATIVE 12/28/2020 2233   Sepsis Labs: @LABRCNTIP (procalcitonin:4,lacticidven:4)  ) Recent Results (from the past 240 hour(s))  Resp Panel by RT-PCR (Flu A&B, Covid) Nasopharyngeal Swab     Status: None   Collection Time: 12/28/20  9:39 PM   Specimen: Nasopharyngeal  Swab; Nasopharyngeal(NP) swabs in vial transport medium  Result Value Ref Range Status   SARS Coronavirus 2 by RT PCR NEGATIVE NEGATIVE Final    Comment: (NOTE) SARS-CoV-2 target nucleic acids are NOT DETECTED.  The SARS-CoV-2 RNA is generally detectable in upper respiratory specimens during the acute phase of infection. The lowest concentration of SARS-CoV-2 viral copies this assay can detect is 138 copies/mL. A negative result does not preclude SARS-Cov-2 infection and should not be used as the sole basis for treatment or other patient management decisions. A negative result may occur with  improper specimen collection/handling, submission of specimen other than nasopharyngeal swab, presence of viral mutation(s) within the areas targeted by this assay, and inadequate number of viral copies(<138 copies/mL). A negative result must be combined with clinical observations, patient history, and epidemiological information. The expected result is Negative.  Fact Sheet for Patients:  EntrepreneurPulse.com.au  Fact Sheet for Healthcare Providers:  IncredibleEmployment.be  This test is no t yet approved or cleared by the [place] FDA and  has been authorized for detection and/or diagnosis of SARS-CoV-2 by FDA under an Emergency Use Authorization (EUA).  This EUA will remain  in effect (meaning this test can be used) for the duration of the COVID-19 declaration under Section 564(b)(1) of the Act, 21 U.S.C.section 360bbb-3(b)(1), unless the authorization is terminated  or revoked sooner.       Influenza A by PCR NEGATIVE NEGATIVE Final   Influenza B by PCR NEGATIVE NEGATIVE Final    Comment: (NOTE) The Xpert Xpress SARS-CoV-2/FLU/RSV plus assay is intended as an aid in the diagnosis of influenza from Nasopharyngeal swab specimens and should not be used as a sole basis for treatment. Nasal washings and aspirates are unacceptable for Xpert Xpress  SARS-CoV-2/FLU/RSV testing.  Fact Sheet for Patients: EntrepreneurPulse.com.au  Fact Sheet for Healthcare Providers: IncredibleEmployment.be  This test is not yet approved or cleared by the [place] FDA and has been authorized for detection and/or diagnosis of SARS-CoV-2 by FDA under an Emergency Use Authorization (EUA). This EUA will remain in effect (meaning this test can be used) for the duration of the COVID-19 declaration under Section 564(b)(1) of the Act, 21 U.S.C. section 360bbb-3(b)(1), unless the authorization is terminated or revoked.  Performed at [facility], [address] [place], [place] [address]   MRSA Next Gen by PCR, Nasal     Status: None   Collection Time: 12/28/20 11:59 PM   Specimen: Nasal Mucosa; Nasal Swab  Result Value Ref Range Status   MRSA by PCR Next Gen NOT DETECTED NOT DETECTED Final    Comment: (NOTE) The GeneXpert MRSA Assay (FDA approved for NASAL specimens only), is one component of a comprehensive MRSA colonization surveillance program. It is not intended to diagnose MRSA infection nor to guide or monitor treatment for MRSA infections. Test performance is not FDA approved in patients less than 32 years old. Performed at [facility], [address], [place], [place] [address]          Radiology Studies: CT ABDOMEN PELVIS WO CONTRAST  Result Date: 12/31/2020 CLINICAL DATA:  F/U pneumatosis, epigastric pain x 1 week. Pneumatosis intestinalis EXAM: CT ABDOMEN AND PELVIS WITHOUT CONTRAST TECHNIQUE: Multidetector CT imaging of the abdomen and pelvis was performed following the standard protocol without IV contrast. COMPARISON:  CT abdomen pelvis 12/29/2020 FINDINGS: Lower chest: No acute abnormality. Hepatobiliary: No focal liver abnormality. No gallstones, gallbladder wall thickening, or pericholecystic fluid. No biliary dilatation. Pancreas: No focal lesion. Normal pancreatic contour. No surrounding  inflammatory changes. No main pancreatic ductal dilatation. Spleen: Normal in size without focal abnormality. Adrenals/Urinary Tract: No adrenal nodule bilaterally. No nephrolithiasis and no hydronephrosis. No definite contour-deforming renal mass. No ureterolithiasis or hydroureter. The urinary bladder is unremarkable. Stomach/Bowel: PO contrast reaches the cecum. Stomach is within normal limits. No evidence of bowel wall thickening or dilatation. Trace residual pneumatosis along the small bowel in the right abdomen (2:30 1-37). Few scattered colonic diverticula. Short normal in caliber appendix. Vascular/Lymphatic: No abdominal aorta or iliac aneurysm. Mild atherosclerotic plaque of the aorta and its branches. No abdominal, pelvic, or inguinal lymphadenopathy. Reproductive: Prostate is unremarkable. Other: Mild mesenteric edema within the mid abdomen (5:50, 2:35). No intraperitoneal free fluid. No intraperitoneal free gas. No organized fluid collection. Musculoskeletal: No abdominal wall hernia or abnormality. No suspicious lytic or blastic osseous lesions. No acute displaced fracture. Multilevel degenerative changes of the spine. IMPRESSION: 1. Trace residual pneumatosis along the small bowel in the right abdomen. Differential diagnosis includes gas within the mesenteric veins. Limited evaluation on this noncontrast study. As previously stated, findings favored to represent benign pneumatosis intestinalis. 2. Mild mesenteric  edema within the mid abdomen of unclear etiology. 3. Colonic diverticulosis with no acute diverticulitis. Electronically Signed   By: Iven Finn M.D.   On: 12/31/2020 16:16        Scheduled Meds:  Chlorhexidine Gluconate Cloth  6 each Topical Q0600   insulin aspart  0-15 Units Subcutaneous Q4H   levothyroxine  25 mcg Intravenous Daily   pantoprazole (PROTONIX) IV  40 mg Intravenous QHS   Continuous Infusions:  sodium chloride Stopped (12/31/20 1046)   sodium chloride 20  mL/hr at 01/01/21 1512     LOS: 4 days       Ellie Lunch, student [facility] Pager [phone]  If 7PM-7AM, please contact night-coverage [email or web address] Password [facility] 01/01/2021, 4:24 PM

## 2021-01-01 NOTE — Progress Notes (Addendum)
Initial Nutrition Assessment  DOCUMENTATION CODES:   Obesity unspecified  INTERVENTION:  Vital 1.5 - 237 ml (8 oz) orally 6 times daily.   ProSource Plus 30 ml TID (100 kcal, 15 gr protein) each 30 ml  Regimen provides 2430 kcal, 141 gr protein and 1086 ml water.   NUTRITION DIAGNOSIS:   Inadequate oral intake related to acute illness (pneumatosis intestinalis -requiring elemental diet) as evidenced by energy intake < or equal to 50% for > or equal to 5 days.   GOAL:  Patient will meet greater than or equal to 90% of their needs  MONITOR:  PO intake, Supplement acceptance, Labs, Weight trends  REASON FOR ASSESSMENT:   Consult Elemental diet  ASSESSMENT: Patient is an obese 60 yo male with hx of GERD, Cardiomyopathy, CHF, HTN, Diverticulosis.   Presents with complaint of epigastric pain, decreased appetite, swallow difficulty and weight loss. AKI, hypotension. Required temporary pressor support.   9/23, 9/24 and 9/26 CT abdomen /pelvis - pneumatosis intestinalis 9/24 Central line placed.   Patient sitting up in bed and family present. Part of his treatment plan will include an elemental diet for 2 weeks. Vital 1.5 vanilla is recommended. Patient tasted formula and expressed concern about consuming this as primary nutrition source. Patient currently on clear liquids-unsure at this time if he will be discharged on clears.    Patient weight has decreased from 124.3 kg to current 116.8 kg (6%) the past 5-6 weeks.  Medications reviewed and include: Protonix, levothyroxine, novolog.  IV- NS @100  ml/hr.   CBG (last 3)  9/23-A1C-6.2% Recent Labs    01/01/21 0733 01/01/21 1124 01/01/21 1600  GLUCAP 86 93 151*    Labs:  BMP Latest Ref Rng & Units 01/01/2021 12/31/2020 12/30/2020  Glucose 70 - 99 mg/dL 79 96 121(H)  BUN 8 - 23 mg/dL 23 40(H) 59(H)  Creatinine 0.61 - 1.24 mg/dL 1.20 1.58(H) 1.64(H)  Sodium 135 - 145 mmol/L 131(L) 132(L) 129(L)  Potassium 3.5 - 5.1 mmol/L 4.3  4.5 4.5  Chloride 98 - 111 mmol/L 100 100 102  CO2 22 - 32 mmol/L 25 24 20(L)  Calcium 8.9 - 10.3 mg/dL 7.8(L) 8.4(L) 8.1(L)    Diet Order:   Diet Order             Diet clear liquid Room service appropriate? Yes; Fluid consistency: Thin  Diet effective now                   EDUCATION NEEDS:  Education needs have been addressed  Skin:  Skin Assessment: Reviewed RN Assessment  Last BM:  9/27 type 7   Height:   Ht Readings from Last 1 Encounters:  12/28/20 5\' 10"  (1.778 m)    Weight:   Wt Readings from Last 1 Encounters:  01/01/21 116.8 kg    Ideal Body Weight:   75 kg  BMI:  Body mass index is 36.95 kg/m.  Estimated Nutritional Needs:   Kcal:  2300-2500  Protein:  150-165 gr  Fluid:  2 liters daily   Colman Cater MS,RD,CSG,LDN Contact: Shea Evans

## 2021-01-01 NOTE — Progress Notes (Signed)
Subjective: Reports he is feeling well today. Denies abdominal pain, nausea.  Tolerating liquid diet well.  States his only problem is dysphagia/odynophagia.  Currently on liquid diet and continues with dysphagia.  Feels like they are building up if he drinks too quickly.  No history of dysphagia.  Symptoms started 2 weeks ago.  Just prior to admission, this last solid meal was a peanut butter and jelly sandwich which took about 25 minutes to eat due to items getting hung in his mid chest.  Chronic history of reflux that has previously been well controlled on famotidine daily.  Worsening symptoms couple weeks prior to admission.  Also reports having few days of black stools prior to admission, but now resolved.  No BRBPR.  Objective: Vital signs in last 24 hours: Temp:  [97.7 F (36.5 C)-98.6 F (37 C)] 98.4 F (36.9 C) (09/27 1204) Pulse Rate:  [65-90] 73 (09/27 1000) Resp:  [11-28] 18 (09/27 1203) BP: (87-128)/(39-78) 128/78 (09/27 1000) SpO2:  [94 %-99 %] 97 % (09/27 1000) Weight:  [427.0 kg] 116.8 kg (09/27 0406) Last BM Date: 01/01/21 General:   Alert and oriented, pleasant, NAD.  Head:  Normocephalic and atraumatic. Eyes:  No icterus, sclera clear. Conjuctiva pink.  Abdomen:  Bowel sounds present, soft, non-tender, non-distended. No rebound or guarding. No masses appreciated  Msk:  Symmetrical without gross deformities. Normal posture. Extremities:  Without edema. Neurologic:  Alert and  oriented x4;  grossly normal neurologically. Psych: Normal mood and affect.  Intake/Output from previous day: 09/26 0701 - 09/27 0700 In: 2797.3 [I.V.:2635.3; IV Piggyback:162] Out: 676 [Urine:675; Stool:1] Intake/Output this shift: Total I/O In: 300 [P.O.:300] Out: 425 [Urine:425]  Lab Results: Recent Labs    12/30/20 0400 12/31/20 0410 01/01/21 0446  WBC 12.0* 10.5 7.4  HGB 10.8* 11.1* 9.2*  HCT 31.8* 32.0* 27.0*  PLT 204 189 130*   BMET Recent Labs    12/30/20 0400  12/31/20 0410 01/01/21 0446  NA 129* 132* 131*  K 4.5 4.5 4.3  CL 102 100 100  CO2 20* 24 25  GLUCOSE 121* 96 79  BUN 59* 40* 23  CREATININE 1.64* 1.58* 1.20  CALCIUM 8.1* 8.4* 7.8*    Studies/Results: CT ABDOMEN PELVIS WO CONTRAST  Result Date: 12/31/2020 CLINICAL DATA:  F/U pneumatosis, epigastric pain x 1 week. Pneumatosis intestinalis EXAM: CT ABDOMEN AND PELVIS WITHOUT CONTRAST TECHNIQUE: Multidetector CT imaging of the abdomen and pelvis was performed following the standard protocol without IV contrast. COMPARISON:  CT abdomen pelvis 12/29/2020 FINDINGS: Lower chest: No acute abnormality. Hepatobiliary: No focal liver abnormality. No gallstones, gallbladder wall thickening, or pericholecystic fluid. No biliary dilatation. Pancreas: No focal lesion. Normal pancreatic contour. No surrounding inflammatory changes. No main pancreatic ductal dilatation. Spleen: Normal in size without focal abnormality. Adrenals/Urinary Tract: No adrenal nodule bilaterally. No nephrolithiasis and no hydronephrosis. No definite contour-deforming renal mass. No ureterolithiasis or hydroureter. The urinary bladder is unremarkable. Stomach/Bowel: PO contrast reaches the cecum. Stomach is within normal limits. No evidence of bowel wall thickening or dilatation. Trace residual pneumatosis along the small bowel in the right abdomen (2:30 1-37). Few scattered colonic diverticula. Short normal in caliber appendix. Vascular/Lymphatic: No abdominal aorta or iliac aneurysm. Mild atherosclerotic plaque of the aorta and its branches. No abdominal, pelvic, or inguinal lymphadenopathy. Reproductive: Prostate is unremarkable. Other: Mild mesenteric edema within the mid abdomen (5:50, 2:35). No intraperitoneal free fluid. No intraperitoneal free gas. No organized fluid collection. Musculoskeletal: No abdominal wall hernia or abnormality. No  suspicious lytic or blastic osseous lesions. No acute displaced fracture. Multilevel  degenerative changes of the spine. IMPRESSION: 1. Trace residual pneumatosis along the small bowel in the right abdomen. Differential diagnosis includes gas within the mesenteric veins. Limited evaluation on this noncontrast study. As previously stated, findings favored to represent benign pneumatosis intestinalis. 2. Mild mesenteric edema within the mid abdomen of unclear etiology. 3. Colonic diverticulosis with no acute diverticulitis. Electronically Signed   By: Iven Finn M.D.   On: 12/31/2020 16:16    Assessment: 60 year old gentleman with multiple medical problems including diabetes, cardiomyopathy with associated heart failure/reduced ejection fraction, OSA with CPAP presenting with nearly  1 month history of malaise, anorexia, weight loss ,gnawing upper abdominal discomfort with perceived dysphagia and some odynophagia to solids and liquids without nausea or vomiting. He was found to have extensive pneumatosis intestinalis, acute kidney injury, acidosis, and circulatory shock and required Levophed.   Etiology of pneumatosis intestinalis is unknown.  Unclear if Ozempic has anything to do with this, but symptoms started shortly after taking his first dose. No evidence of perforation or other cause of pneumatosis on cross-sectional imaging.  Per Dr. Constance Haw, vessels are open and normal appearance with no major atherosclerotic disease, no need for CTA.  Serial CTs this admission with last imaging yesterday revealing trace residual pneumatosis.    Clinically, patient has been improving.  Levophed stopped at 11 AM yesterday, clear liquid started last night which he is tolerating well.  Dr. Constance Haw has recommended following an elemental diet for the next 2 weeks, repeat CT in 2 weeks with oral and IV contrast, and stop IV antibiotics.  Dysphagia/odynophagia: New onset x2 weeks.  Occurring with solids and liquids.  We had considered an EGD this admission for further evaluation, but Dr. Constance Haw has  recommended holding off on this until he has his 2-week repeat CT.  As he has been tolerating a liquid diet this admission and will be discharged on an elemental diet, I suspect he will continue to do ok with this.   Hemoglobin down to 9.2 today.  Likely hemodilution as WBC and platelets have also decreased.  No overt GI bleeding though he did report some black stools prior to admission.  No prior EGD.  Prior ERCP in 2019 grossly normal upper GI tract..  Last colonoscopy 2019 with 2 mm sessile, due for repeat in 2024. Continue to monitor inpatient. Will need follow-up with primary GI outpatient.   Plan: Continue PPI daily. Avoid Ozempic.  Consider EGD with possible esophageal dilation in a couple of weeks after repeat CT per Dr. Constance Haw.  This can be completed with primary GI team ([facility]). If unable to tolerate liquid diet, would need to consider further evaluation, possibly with esophagram if unable to pursue EGD.  Elemental diet x2 weeks per Dr. Constance Haw. CT in 2 weeks with oral and IV contrast per Dr. Constance Haw. Antibiotics discontinued per Dr. Constance Haw. Monitor H&H and for overt GI bleeding.    LOS: 4 days    01/01/2021, 1:49 PM   Aliene Altes, [facility]

## 2021-01-01 NOTE — Plan of Care (Signed)

## 2021-01-01 NOTE — Progress Notes (Signed)
[facility] Progress Note     Subjective: Tolerating clears, having Bms. No pain just still with issues swallowing /esophagus issues.   Objective: Vital signs in last 24 hours: Temp:  [97.7 F (36.5 C)-98.6 F (37 C)] 98.4 F (36.9 C) (09/27 1204) Pulse Rate:  [65-90] 73 (09/27 1000) Resp:  [11-28] 18 (09/27 1203) BP: (87-128)/(39-78) 128/78 (09/27 1000) SpO2:  [94 %-99 %] 97 % (09/27 1000) Weight:  [116.8 kg] 116.8 kg (09/27 0406) Last BM Date: 01/01/21  Intake/Output from previous day: 09/26 0701 - 09/27 0700 In: 2797.3 [I.V.:2635.3; IV Piggyback:162] Out: 676 [Urine:675; Stool:1] Intake/Output this shift: Total I/O In: 300 [P.O.:300] Out: 425 [Urine:425]  General appearance: alert and no distress GI: soft, non-tender; bowel sounds normal; no masses,  no organomegaly  Lab Results:  Recent Labs    12/31/20 0410 01/01/21 0446  WBC 10.5 7.4  HGB 11.1* 9.2*  HCT 32.0* 27.0*  PLT 189 130*   BMET Recent Labs    12/31/20 0410 01/01/21 0446  NA 132* 131*  K 4.5 4.3  CL 100 100  CO2 24 25  GLUCOSE 96 79  BUN 40* 23  CREATININE 1.58* 1.20  CALCIUM 8.4* 7.8*   PT/INR No results for input(s): LABPROT, INR in the last 72 hours.  Studies/Results: CT ABDOMEN PELVIS WO CONTRAST  Result Date: 12/31/2020 CLINICAL DATA:  F/U pneumatosis, epigastric pain x 1 week. Pneumatosis intestinalis EXAM: CT ABDOMEN AND PELVIS WITHOUT CONTRAST TECHNIQUE: Multidetector CT imaging of the abdomen and pelvis was performed following the standard protocol without IV contrast. COMPARISON:  CT abdomen pelvis 12/29/2020 FINDINGS: Lower chest: No acute abnormality. Hepatobiliary: No focal liver abnormality. No gallstones, gallbladder wall thickening, or pericholecystic fluid. No biliary dilatation. Pancreas: No focal lesion. Normal pancreatic contour. No surrounding inflammatory changes. No main pancreatic ductal dilatation. Spleen: Normal in size without focal abnormality.  Adrenals/Urinary Tract: No adrenal nodule bilaterally. No nephrolithiasis and no hydronephrosis. No definite contour-deforming renal mass. No ureterolithiasis or hydroureter. The urinary bladder is unremarkable. Stomach/Bowel: PO contrast reaches the cecum. Stomach is within normal limits. No evidence of bowel wall thickening or dilatation. Trace residual pneumatosis along the small bowel in the right abdomen (2:30 1-37). Few scattered colonic diverticula. Short normal in caliber appendix. Vascular/Lymphatic: No abdominal aorta or iliac aneurysm. Mild atherosclerotic plaque of the aorta and its branches. No abdominal, pelvic, or inguinal lymphadenopathy. Reproductive: Prostate is unremarkable. Other: Mild mesenteric edema within the mid abdomen (5:50, 2:35). No intraperitoneal free fluid. No intraperitoneal free gas. No organized fluid collection. Musculoskeletal: No abdominal wall hernia or abnormality. No suspicious lytic or blastic osseous lesions. No acute displaced fracture. Multilevel degenerative changes of the spine. IMPRESSION: 1. Trace residual pneumatosis along the small bowel in the right abdomen. Differential diagnosis includes gas within the mesenteric veins. Limited evaluation on this noncontrast study. As previously stated, findings favored to represent benign pneumatosis intestinalis. 2. Mild mesenteric edema within the mid abdomen of unclear etiology. 3. Colonic diverticulosis with no acute diverticulitis. Electronically Signed   By: Iven Finn M.D.   On: 12/31/2020 16:16    Anti-infectives: Anti-infectives (From admission, onward)    Start     Dose/Rate Route Frequency Ordered Stop   12/29/20 1600  piperacillin-tazobactam (ZOSYN) IVPB 3.375 g  Status:  Discontinued        3.375 g 12.5 mL/hr over 240 Minutes Intravenous Every 8 hours 12/29/20 0735 01/01/21 0857   12/29/20 0800  piperacillin-tazobactam (ZOSYN) IVPB 3.375 g  3.375 g 100 mL/hr over 30 Minutes Intravenous  Once  12/29/20 0734 12/29/20 0949   12/28/20 2100  metroNIDAZOLE (FLAGYL) IVPB 500 mg  Status:  Discontinued        500 mg 100 mL/hr over 60 Minutes Intravenous Every 12 hours 12/28/20 2059 12/29/20 0734       Assessment/Plan: Mr. Moening is a 60 yo with pneumatosis intestinalis of unknown etiology of the small bowel, acute kidney injury. All of which is improving and recent Ct with just trace pneumatosis. Have reviewed recommendations and a elemental diet will be best for the patient for the next 2 weeks to decrease any stress on the GI system. I have discussed this with the patient and have given him a sheet.  -Dietician/ nutrition consult to help with elemental diet planning, I may have to write a prescription to get him this if needed as outpatient -CT in 2 weeks with oral and IV contrast -Patient drives for [place] and will decide if he wants to be out a few weeks given all of the above, he will let my office know if we need to fill out any FMLA -If can start on Elemental diet today, would think could potentially dc in the next 48 hours -Ok to stop IV antibiotics  -Updated team, would think possible EGD after 2 week CT  -Can restart CPAP to make sure patient has no clinical changes     LOS: 4 days    Virl Cagey 01/01/2021

## 2021-01-01 NOTE — Progress Notes (Signed)
Report given to [place], RN on Dept. 300. Pt to be transported via wheelchair to room 307.

## 2021-01-02 ENCOUNTER — Other Ambulatory Visit: Payer: Self-pay | Admitting: Family Medicine

## 2021-01-02 DIAGNOSIS — R1010 Upper abdominal pain, unspecified: Secondary | ICD-10-CM

## 2021-01-02 DIAGNOSIS — I420 Dilated cardiomyopathy: Secondary | ICD-10-CM

## 2021-01-02 DIAGNOSIS — K6389 Other specified diseases of intestine: Secondary | ICD-10-CM

## 2021-01-02 DIAGNOSIS — R1310 Dysphagia, unspecified: Secondary | ICD-10-CM | POA: Diagnosis not present

## 2021-01-02 DIAGNOSIS — I5022 Chronic systolic (congestive) heart failure: Secondary | ICD-10-CM

## 2021-01-02 LAB — CBC
HCT: 27.4 % — ABNORMAL LOW (ref 39.0–52.0)
Hemoglobin: 9.3 g/dL — ABNORMAL LOW (ref 13.0–17.0)
MCH: 32.9 pg (ref 26.0–34.0)
MCHC: 33.9 g/dL (ref 30.0–36.0)
MCV: 96.8 fL (ref 80.0–100.0)
Platelets: 138 10*3/uL — ABNORMAL LOW (ref 150–400)
RBC: 2.83 MIL/uL — ABNORMAL LOW (ref 4.22–5.81)
RDW: 12.2 % (ref 11.5–15.5)
WBC: 6.6 10*3/uL (ref 4.0–10.5)
nRBC: 0 % (ref 0.0–0.2)

## 2021-01-02 LAB — MAGNESIUM: Magnesium: 1.6 mg/dL — ABNORMAL LOW (ref 1.7–2.4)

## 2021-01-02 LAB — GLUCOSE, CAPILLARY
Glucose-Capillary: 114 mg/dL — ABNORMAL HIGH (ref 70–99)
Glucose-Capillary: 82 mg/dL (ref 70–99)
Glucose-Capillary: 138 mg/dL — ABNORMAL HIGH (ref 70–99)
Glucose-Capillary: 107 mg/dL — ABNORMAL HIGH (ref 70–99)
Glucose-Capillary: 137 mg/dL — ABNORMAL HIGH (ref 70–99)
Glucose-Capillary: 113 mg/dL — ABNORMAL HIGH (ref 70–99)

## 2021-01-02 LAB — BASIC METABOLIC PANEL
Anion gap: 7 (ref 5–15)
BUN: 14 mg/dL (ref 8–23)
CO2: 24 mmol/L (ref 22–32)
Calcium: 7.9 mg/dL — ABNORMAL LOW (ref 8.9–10.3)
Chloride: 100 mmol/L (ref 98–111)
Creatinine, Ser: 1.05 mg/dL (ref 0.61–1.24)
GFR, Estimated: >60 mL/min (ref >60)
Glucose, Bld: 94 mg/dL (ref 70–99)
Potassium: 4.3 mmol/L (ref 3.5–5.1)
Sodium: 131 mmol/L — ABNORMAL LOW (ref 135–145)

## 2021-01-02 MED ORDER — LEVOTHYROXINE SODIUM 25 MCG PO TABS
25.0000 ug | ORAL_TABLET | Freq: Every day | ORAL | Status: DC
  Administered 2021-01-03: 25 ug via ORAL
  Filled 2021-01-02: qty 1

## 2021-01-02 MED ORDER — MAGNESIUM SULFATE 4 GM/100ML IV SOLN
4.0000 g | Freq: Once | INTRAVENOUS | Status: AC
  Administered 2021-01-02: 4 g via INTRAVENOUS
  Filled 2021-01-02: qty 100

## 2021-01-02 MED ORDER — INSULIN ASPART 100 UNIT/ML IJ SOLN: 0.0000 [IU] | Freq: Three times a day (TID) | INTRAMUSCULAR | Status: DC

## 2021-01-02 NOTE — Progress Notes (Signed)
Patient stated that he was told not to wear CPAP due to medical conditions that he has at this time.  CPAP is at bedside if needed.  Patient states that he does use one at home.

## 2021-01-02 NOTE — Progress Notes (Signed)
[facility] Progress Note     Subjective: No pain with diet. Started elemental vital feeds. Taste is ok. Prosource also recommended by RD.   Objective: Vital signs in last 24 hours: Temp:  [97.7 F (36.5 C)-98.4 F (36.9 C)] 97.7 F (36.5 C) (09/28 0510) Pulse Rate:  [70-81] 80 (09/28 0510) Resp:  [18-23] 19 (09/28 0510) BP: (101-125)/(54-69) 101/61 (09/28 0510) SpO2:  [95 %-100 %] 95 % (09/28 0510) Last BM Date: 01/01/21  Intake/Output from previous day: 09/27 0701 - 09/28 0700 In: 1834.5 [P.O.:822; I.V.:1012.5] Out: 825 [Urine:825] Intake/Output this shift: Total I/O In: 240 [P.O.:240] Out: -   General appearance: alert and no distress Resp: normal work of breathing GI: soft, nondistended, nontender  Lab Results:  Recent Labs    01/01/21 0446 01/02/21 0501  WBC 7.4 6.6  HGB 9.2* 9.3*  HCT 27.0* 27.4*  PLT 130* 138*   BMET Recent Labs    01/01/21 0446 01/02/21 0501  NA 131* 131*  K 4.3 4.3  CL 100 100  CO2 25 24  GLUCOSE 79 94  BUN 23 14  CREATININE 1.20 1.05  CALCIUM 7.8* 7.9*   PT/INR No results for input(s): LABPROT, INR in the last 72 hours.  Studies/Results: CT ABDOMEN PELVIS WO CONTRAST  Result Date: 12/31/2020 CLINICAL DATA:  F/U pneumatosis, epigastric pain x 1 week. Pneumatosis intestinalis EXAM: CT ABDOMEN AND PELVIS WITHOUT CONTRAST TECHNIQUE: Multidetector CT imaging of the abdomen and pelvis was performed following the standard protocol without IV contrast. COMPARISON:  CT abdomen pelvis 12/29/2020 FINDINGS: Lower chest: No acute abnormality. Hepatobiliary: No focal liver abnormality. No gallstones, gallbladder wall thickening, or pericholecystic fluid. No biliary dilatation. Pancreas: No focal lesion. Normal pancreatic contour. No surrounding inflammatory changes. No main pancreatic ductal dilatation. Spleen: Normal in size without focal abnormality. Adrenals/Urinary Tract: No adrenal nodule bilaterally. No nephrolithiasis  and no hydronephrosis. No definite contour-deforming renal mass. No ureterolithiasis or hydroureter. The urinary bladder is unremarkable. Stomach/Bowel: PO contrast reaches the cecum. Stomach is within normal limits. No evidence of bowel wall thickening or dilatation. Trace residual pneumatosis along the small bowel in the right abdomen (2:30 1-37). Few scattered colonic diverticula. Short normal in caliber appendix. Vascular/Lymphatic: No abdominal aorta or iliac aneurysm. Mild atherosclerotic plaque of the aorta and its branches. No abdominal, pelvic, or inguinal lymphadenopathy. Reproductive: Prostate is unremarkable. Other: Mild mesenteric edema within the mid abdomen (5:50, 2:35). No intraperitoneal free fluid. No intraperitoneal free gas. No organized fluid collection. Musculoskeletal: No abdominal wall hernia or abnormality. No suspicious lytic or blastic osseous lesions. No acute displaced fracture. Multilevel degenerative changes of the spine. IMPRESSION: 1. Trace residual pneumatosis along the small bowel in the right abdomen. Differential diagnosis includes gas within the mesenteric veins. Limited evaluation on this noncontrast study. As previously stated, findings favored to represent benign pneumatosis intestinalis. 2. Mild mesenteric edema within the mid abdomen of unclear etiology. 3. Colonic diverticulosis with no acute diverticulitis. Electronically Signed   By: Iven Finn M.D.   On: 12/31/2020 16:16    Anti-infectives: Anti-infectives (From admission, onward)    Start     Dose/Rate Route Frequency Ordered Stop   12/29/20 1600  piperacillin-tazobactam (ZOSYN) IVPB 3.375 g  Status:  Discontinued        3.375 g 12.5 mL/hr over 240 Minutes Intravenous Every 8 hours 12/29/20 0735 01/01/21 0857   12/29/20 0800  piperacillin-tazobactam (ZOSYN) IVPB 3.375 g        3.375 g 100 mL/hr over 30 Minutes  Intravenous  Once 12/29/20 0734 12/29/20 0949   12/28/20 2100  metroNIDAZOLE (FLAGYL) IVPB  500 mg  Status:  Discontinued        500 mg 100 mL/hr over 60 Minutes Intravenous Every 12 hours 12/28/20 2059 12/29/20 0734       Assessment/Plan: Nicholas Gray is a 60 yo with pneumatosis intestinalis of unknown etiology that has improved and overall is clinically doing well. Started clears and elemental diet yesterday. Will need Dc plan for elemental diet to have sufficient resource through 10/11, SW working on programs that can help pain for this Can have clear liquids, like juice, tea, diluted broths for comfort, but no real nutritional value Labs all improving, Cr down trending  I plan to see 10/11 and get CT scan with oral and IV contrast that week too Will need prosource and Vital for home Hopefully home in next 24 hours if we can sort this plan out  Patient and team updated and aware.     LOS: 5 days    Virl Cagey 01/02/2021

## 2021-01-02 NOTE — Progress Notes (Signed)
PROGRESS NOTE    Nicholas Gray  YWV:[id number] DOB: 03/02/1961 DOA: 12/28/2020 PCP: [facility]   Brief Narrative:  Nicholas Gray, 60 year old male, presented with a prerenal aki after several weeks of decreased oral intake. History of HFrEF, gerd, htn, hld, hypothyroidism, and osa. He reports staring ozempic mid august, reports abdominal pain and difficulty eating, a feeling of 'stuck' food when he ate or drank liquids. After several weeks of difficulty with eating and drinking, he presented with a significant pre-renal acute kidney injury. Significant metabolic acidosis, cardiovascular compromise, hyperkalemia, SIADH as a result of the AKI. With rehydration, correction of multiple electrolyte abnormalities, cardiovascular support including vasopressors, he has improved greatly.   Assessment & Plan:   Principal Problem:      AKI with multiple electrolyte disturbances -initial metabolic acidosis has resolved -ceased bicarb drip -iv fluid normal saline continues as current sodium is 9/28 131 vs 9/23 126 -continue to monitor and repeat lab tomorrow morning     Active Problems:   Pneumatosis intestinalis -Repeated ct scan shows marked improvement 9/27 -risk of perforation has reduced enough to cease zosyn 9/27 -advanced from liquid to elemental diet   Circulatory shock -resolved -stopped norepinephrine 9/27  Hypomagnesemia -09/28 1.6   Hyponatremia -  9/23 126, 9/28 131 -elemental diet -continued iv normal saline  SIADH -secondary to AKI -resolved with treatment of AKI     Hyperkalemia -secondary to prerenal aki -9/23 5.9, 9/28 4.3 -resolved     Essential hypertension -hold home antihypertensives for now, improved circulatory status but not resolved     Dilated cardiomyopathy ([place]) -hold asa -hold home antihypertensives     LBBB (left bundle branch block) -chronic, monitored     OSA (obstructive sleep apnea) -cpap used at night      Chronic systolic CHF (congestive heart failure) -holding home antihypertensives, volume and electrolyte status improving, but not resolved   Hypothyroidism -levothyroxine 18mcg iv qd       DVT prophylaxis: Code Status: full Family Communication: wife present Disposition Plan: plan is to d/c pt to home tomorrow 9/29   Consultants:  General surgery gastroenterology  Procedures:  Antimicrobials: zosyn d/c 9/27  Subjective: Patient reports he feels much improved, pink, warm, dry ambulatory  Objective: Vitals:   01/01/21 1418 01/01/21 1810 01/02/21 0001 01/02/21 0510  BP: 123/65 125/69 (!) 112/54 101/61  Pulse:  70 81 80  Resp: (!) 23 20 19 19   Temp:   97.9 F (36.6 C) 97.7 F (36.5 C)  TempSrc:   Oral Oral  SpO2: 100% 99% 97% 95%  Weight:      Height:        Intake/Output Summary (Last 24 hours) at 01/02/2021 1318 Last data filed at 01/02/2021 0841 Gross per 24 hour  Intake 1774.5 ml  Output 400 ml  Net 1374.5 ml   Filed Weights   12/28/20 1556 12/30/20 0500 01/01/21 0406  Weight: 124.7 kg 124.7 kg 116.8 kg    Examination:  General exam: Appears calm and comfortable, seated in his chair,  Respiratory system: Clear to auscultation. Respiratory effort normal. Cardiovascular system: S1 & S2 heard, RRR. No JVD, murmurs, rubs, gallops or clicks. No pedal edema. Gastrointestinal system: Abdomen is nondistended, soft and nontender. No organomegaly or masses felt. Normal bowel sounds heard. Central nervous system: Alert and oriented. No focal neurological deficits. Extremities: moves all extremities appropriately, no pedal edema Skin: No obvious rashes or lesions Psychiatry: Judgement and insight appear normal. Mood & affect appropriate.  Data Reviewed: I have personally reviewed following labs and imaging studies  CBC: Recent Labs  Lab 12/29/20 1901 12/30/20 0400 12/31/20 0410 01/01/21 0446 01/02/21 0501  WBC 11.1* 12.0* 10.5 7.4 6.6  NEUTROABS 7.1   --   --   --   --   HGB 11.3* 10.8* 11.1* 9.2* 9.3*  HCT 32.1* 31.8* 32.0* 27.0* 27.4*  MCV 95.0 94.4 95.8 96.4 96.8  PLT 184 204 189 130* 403*   Basic Metabolic Panel: Recent Labs  Lab 12/29/20 1901 12/30/20 0400 12/31/20 0410 01/01/21 0446 01/02/21 0501  NA 129* 129* 132* 131* 131*  K 4.9 4.5 4.5 4.3 4.3  CL 104 102 100 100 100  CO2 19* 20* 24 25 24   GLUCOSE 99 121* 96 79 94  BUN 73* 59* 40* 23 14  CREATININE 2.00* 1.64* 1.58* 1.20 1.05  CALCIUM 8.5* 8.1* 8.4* 7.8* 7.9*  MG  --  1.4* 2.1 1.7 1.6*   GFR: Estimated Creatinine Clearance: 94.6 mL/min (by C-G formula based on SCr of 1.05 mg/dL). Liver Function Tests: Recent Labs  Lab 12/28/20 1620  AST 18  ALT 27  ALKPHOS 43  BILITOT 0.2*  PROT 7.6  ALBUMIN 4.1   Recent Labs  Lab 12/28/20 1620  LIPASE 67*   No results for input(s): AMMONIA in the last 168 hours. Coagulation Profile: No results for input(s): INR, PROTIME in the last 168 hours. Cardiac Enzymes: No results for input(s): CKTOTAL, CKMB, CKMBINDEX, TROPONINI in the last 168 hours. BNP (last 3 results) No results for input(s): PROBNP in the last 8760 hours. HbA1C: No results for input(s): HGBA1C in the last 72 hours. CBG: Recent Labs  Lab 01/01/21 2117 01/02/21 0111 01/02/21 0442 01/02/21 0654 01/02/21 1210  GLUCAP 75 114* 82 138* 107*   Lipid Profile: No results for input(s): CHOL, HDL, LDLCALC, TRIG, CHOLHDL, LDLDIRECT in the last 72 hours. Thyroid Function Tests: No results for input(s): TSH, T4TOTAL, FREET4, T3FREE, THYROIDAB in the last 72 hours. Anemia Panel: No results for input(s): VITAMINB12, FOLATE, FERRITIN, TIBC, IRON, RETICCTPCT in the last 72 hours. Urine analysis:    Component Value Date/Time   COLORURINE STRAW (A) 12/28/2020 2233   APPEARANCEUR CLEAR 12/28/2020 2233   LABSPEC 1.010 12/28/2020 2233   PHURINE 5.0 12/28/2020 2233   GLUCOSEU >=500 (A) 12/28/2020 2233   HGBUR SMALL (A) 12/28/2020 2233   BILIRUBINUR NEGATIVE  12/28/2020 2233   KETONESUR NEGATIVE 12/28/2020 2233   PROTEINUR NEGATIVE 12/28/2020 2233   NITRITE NEGATIVE 12/28/2020 2233   LEUKOCYTESUR NEGATIVE 12/28/2020 2233   Sepsis Labs: @LABRCNTIP (procalcitonin:4,lacticidven:4)  ) Recent Results (from the past 240 hour(s))  Resp Panel by RT-PCR (Flu A&B, Covid) Nasopharyngeal Swab     Status: None   Collection Time: 12/28/20  9:39 PM   Specimen: Nasopharyngeal Swab; Nasopharyngeal(NP) swabs in vial transport medium  Result Value Ref Range Status   SARS Coronavirus 2 by RT PCR NEGATIVE NEGATIVE Final    Comment: (NOTE) SARS-CoV-2 target nucleic acids are NOT DETECTED.  The SARS-CoV-2 RNA is generally detectable in upper respiratory specimens during the acute phase of infection. The lowest concentration of SARS-CoV-2 viral copies this assay can detect is 138 copies/mL. A negative result does not preclude SARS-Cov-2 infection and should not be used as the sole basis for treatment or other patient management decisions. A negative result may occur with  improper specimen collection/handling, submission of specimen other than nasopharyngeal swab, presence of viral mutation(s) within the areas targeted by this assay, and inadequate number of  viral copies(<138 copies/mL). A negative result must be combined with clinical observations, patient history, and epidemiological information. The expected result is Negative.  Fact Sheet for Patients:  EntrepreneurPulse.com.au  Fact Sheet for Healthcare Providers:  IncredibleEmployment.be  This test is no t yet approved or cleared by the [place] FDA and  has been authorized for detection and/or diagnosis of SARS-CoV-2 by FDA under an Emergency Use Authorization (EUA). This EUA will remain  in effect (meaning this test can be used) for the duration of the COVID-19 declaration under Section 564(b)(1) of the Act, 21 U.S.C.section 360bbb-3(b)(1), unless the  authorization is terminated  or revoked sooner.       Influenza A by PCR NEGATIVE NEGATIVE Final   Influenza B by PCR NEGATIVE NEGATIVE Final    Comment: (NOTE) The Xpert Xpress SARS-CoV-2/FLU/RSV plus assay is intended as an aid in the diagnosis of influenza from Nasopharyngeal swab specimens and should not be used as a sole basis for treatment. Nasal washings and aspirates are unacceptable for Xpert Xpress SARS-CoV-2/FLU/RSV testing.  Fact Sheet for Patients: EntrepreneurPulse.com.au  Fact Sheet for Healthcare Providers: IncredibleEmployment.be  This test is not yet approved or cleared by the [place] FDA and has been authorized for detection and/or diagnosis of SARS-CoV-2 by FDA under an Emergency Use Authorization (EUA). This EUA will remain in effect (meaning this test can be used) for the duration of the COVID-19 declaration under Section 564(b)(1) of the Act, 21 U.S.C. section 360bbb-3(b)(1), unless the authorization is terminated or revoked.  Performed at [facility], [address], [place], [place] [address]   MRSA Next Gen by PCR, Nasal     Status: None   Collection Time: 12/28/20 11:59 PM   Specimen: Nasal Mucosa; Nasal Swab  Result Value Ref Range Status   MRSA by PCR Next Gen NOT DETECTED NOT DETECTED Final    Comment: (NOTE) The GeneXpert MRSA Assay (FDA approved for NASAL specimens only), is one component of a comprehensive MRSA colonization surveillance program. It is not intended to diagnose MRSA infection nor to guide or monitor treatment for MRSA infections. Test performance is not FDA approved in patients less than 78 years old. Performed at [facility], [address], [place], [place] [address]          Radiology Studies: CT ABDOMEN PELVIS WO CONTRAST  Result Date: 12/31/2020 CLINICAL DATA:  F/U pneumatosis, epigastric pain x 1 week. Pneumatosis intestinalis EXAM: CT ABDOMEN AND PELVIS WITHOUT CONTRAST  TECHNIQUE: Multidetector CT imaging of the abdomen and pelvis was performed following the standard protocol without IV contrast. COMPARISON:  CT abdomen pelvis 12/29/2020 FINDINGS: Lower chest: No acute abnormality. Hepatobiliary: No focal liver abnormality. No gallstones, gallbladder wall thickening, or pericholecystic fluid. No biliary dilatation. Pancreas: No focal lesion. Normal pancreatic contour. No surrounding inflammatory changes. No main pancreatic ductal dilatation. Spleen: Normal in size without focal abnormality. Adrenals/Urinary Tract: No adrenal nodule bilaterally. No nephrolithiasis and no hydronephrosis. No definite contour-deforming renal mass. No ureterolithiasis or hydroureter. The urinary bladder is unremarkable. Stomach/Bowel: PO contrast reaches the cecum. Stomach is within normal limits. No evidence of bowel wall thickening or dilatation. Trace residual pneumatosis along the small bowel in the right abdomen (2:30 1-37). Few scattered colonic diverticula. Short normal in caliber appendix. Vascular/Lymphatic: No abdominal aorta or iliac aneurysm. Mild atherosclerotic plaque of the aorta and its branches. No abdominal, pelvic, or inguinal lymphadenopathy. Reproductive: Prostate is unremarkable. Other: Mild mesenteric edema within the mid abdomen (5:50, 2:35). No intraperitoneal free fluid. No  intraperitoneal free gas. No organized fluid collection. Musculoskeletal: No abdominal wall hernia or abnormality. No suspicious lytic or blastic osseous lesions. No acute displaced fracture. Multilevel degenerative changes of the spine. IMPRESSION: 1. Trace residual pneumatosis along the small bowel in the right abdomen. Differential diagnosis includes gas within the mesenteric veins. Limited evaluation on this noncontrast study. As previously stated, findings favored to represent benign pneumatosis intestinalis. 2. Mild mesenteric edema within the mid abdomen of unclear etiology. 3. Colonic diverticulosis  with no acute diverticulitis. Electronically Signed   By: Iven Finn M.D.   On: 12/31/2020 16:16        Scheduled Meds:  (feeding supplement) PROSource Plus  30 mL Oral TID BM   feeding supplement (VITAL 1.5 CAL)  237 mL Oral 6 X Daily   insulin aspart  0-15 Units Subcutaneous Q4H   levothyroxine  25 mcg Intravenous Daily   pantoprazole (PROTONIX) IV  40 mg Intravenous QHS   Continuous Infusions:  sodium chloride Stopped (12/31/20 1046)   sodium chloride 75 mL/hr at 01/02/21 4360     LOS: 5 days     Ellie Lunch, student [facility] Pager [phone]  If 7PM-7AM, please contact night-coverage [email or web address] Password [email or web address] 01/02/2021, 1:18 PM

## 2021-01-02 NOTE — Progress Notes (Signed)
Nutrition Education Low FODMAP diet  Per discussion with surgery patient to follow elemental regimen 1 week. At that time he  will transition to elimination diet. Provided handouts and review of material with him bedside.   This diet is somewhat complicated and he and spouse will need time to prepare their pantry prior to time of initiation.  Recommended patient check the [employer] for a book on this topic for additional recipe /meal ideas. Encouraged self-education from credible online resources to help support clear understanding of process.   Handouts includes:  Food lists to AVIOD and ENJOY Low FODMAP Nutrition Therapy - from [employer]  Rationale of diet and Phases of transition.   Will follow up with patient prior to discharge to address any additional questions.   Thank you for allowing me to participate in this patient's care.  Colman Cater MS,RD,CSG,LDN Contact: Shea Evans

## 2021-01-02 NOTE — Progress Notes (Addendum)
Subjective: States he had a BM this morning, stools were loose but that is baseline for him. He denies any nausea, vomiting, or abdominal pain. States that stools were black prior to admission, had transitioned to brown in color, now reports that stools this am were black. Denies BRBPR. Denies fatigue, weakness, or shortness of breath. He is tolerating liquid diet okay. Reports that typically dysphagia is with foods, slight resistance with the liquids, however, no significant problems.   Objective: Vital signs in last 24 hours: Temp:  [97.7 F (36.5 C)-98.4 F (36.9 C)] 97.7 F (36.5 C) (09/28 0510) Pulse Rate:  [70-84] 80 (09/28 0510) Resp:  [12-28] 19 (09/28 0510) BP: (101-128)/(54-78) 101/61 (09/28 0510) SpO2:  [95 %-100 %] 95 % (09/28 0510) Last BM Date: 01/01/21 General:   Alert and oriented, pleasant Head:  Normocephalic and atraumatic. Eyes:  No icterus, sclera clear. Conjuctiva pink.  Mouth:  Without lesions, mucosa pink and moist.  Heart:  S1, S2 present, no murmurs noted.  Lungs: Clear to auscultation bilaterally, without wheezing, rales, or rhonchi.  Abdomen:  Bowel sounds present, soft, non-tender, non-distended. No HSM or hernias noted. No rebound or guarding. No masses appreciated  Msk:  Symmetrical without gross deformities. Normal posture. Pulses:  Normal pulses noted. Extremities:  Without clubbing or edema. Neurologic:  Alert and  oriented x4;  grossly normal neurologically. Skin:  Warm and dry, intact without significant lesions.  Psych:  Alert and cooperative. Normal mood and affect.  Intake/Output from previous day: 09/27 0701 - 09/28 0700 In: 1834.5 [P.O.:822; I.V.:1012.5] Out: 825 [Urine:825] Intake/Output this shift: Total I/O In: 240 [P.O.:240] Out: -   Lab Results: Recent Labs    12/31/20 0410 01/01/21 0446 01/02/21 0501  WBC 10.5 7.4 6.6  HGB 11.1* 9.2* 9.3*  HCT 32.0* 27.0* 27.4*  PLT 189 130* 138*   BMET Recent Labs    12/31/20 0410  01/01/21 0446 01/02/21 0501  NA 132* 131* 131*  K 4.5 4.3 4.3  CL 100 100 100  CO2 24 25 24   GLUCOSE 96 79 94  BUN 40* 23 14  CREATININE 1.58* 1.20 1.05  CALCIUM 8.4* 7.8* 7.9*     Studies/Results: CT ABDOMEN PELVIS WO CONTRAST  Result Date: 12/31/2020 CLINICAL DATA:  F/U pneumatosis, epigastric pain x 1 week. Pneumatosis intestinalis EXAM: CT ABDOMEN AND PELVIS WITHOUT CONTRAST TECHNIQUE: Multidetector CT imaging of the abdomen and pelvis was performed following the standard protocol without IV contrast. COMPARISON:  CT abdomen pelvis 12/29/2020 FINDINGS: Lower chest: No acute abnormality. Hepatobiliary: No focal liver abnormality. No gallstones, gallbladder wall thickening, or pericholecystic fluid. No biliary dilatation. Pancreas: No focal lesion. Normal pancreatic contour. No surrounding inflammatory changes. No main pancreatic ductal dilatation. Spleen: Normal in size without focal abnormality. Adrenals/Urinary Tract: No adrenal nodule bilaterally. No nephrolithiasis and no hydronephrosis. No definite contour-deforming renal mass. No ureterolithiasis or hydroureter. The urinary bladder is unremarkable. Stomach/Bowel: PO contrast reaches the cecum. Stomach is within normal limits. No evidence of bowel wall thickening or dilatation. Trace residual pneumatosis along the small bowel in the right abdomen (2:30 1-37). Few scattered colonic diverticula. Short normal in caliber appendix. Vascular/Lymphatic: No abdominal aorta or iliac aneurysm. Mild atherosclerotic plaque of the aorta and its branches. No abdominal, pelvic, or inguinal lymphadenopathy. Reproductive: Prostate is unremarkable. Other: Mild mesenteric edema within the mid abdomen (5:50, 2:35). No intraperitoneal free fluid. No intraperitoneal free gas. No organized fluid collection. Musculoskeletal: No abdominal wall hernia or abnormality. No suspicious lytic or blastic osseous lesions.  No acute displaced fracture. Multilevel degenerative  changes of the spine. IMPRESSION: 1. Trace residual pneumatosis along the small bowel in the right abdomen. Differential diagnosis includes gas within the mesenteric veins. Limited evaluation on this noncontrast study. As previously stated, findings favored to represent benign pneumatosis intestinalis. 2. Mild mesenteric edema within the mid abdomen of unclear etiology. 3. Colonic diverticulosis with no acute diverticulitis. Electronically Signed   By: Iven Finn M.D.   On: 12/31/2020 16:16    Assessment: 60 year old male with pmh to include DM, cardiomyopathy with associated heart failure/reduced EF, OSA w/ CPAP who presented with 1 month duration of malaise anorexia, weight loss, upper abdominal pain, dysphagia, odynophagia with solids and liquids w/o nausea or vomiting. Found to have pneumatosis intestinalis, AKI and circulatory shock, requiring Levophed.  Pneumatosis intestinalis: etiology unclear, possibly related to Ozempic as symptoms started shortly after mediation was began. No evidence of perforation or other etiologic findings on cross-sectional imaging. Per Dr. Constance Haw, vessels are open and normal appearing with no major atherosclerotic disease, CTA not necesseary at this time. Serial CT evaluations this admission with last imagining on Monday revealing trace residual pneumatosis.   Patient continues to improve clinically. Levophed was stopped on Tuesday morning and clear liquid diet started which was being tolerated well. Dr. Constance Haw recommended following elemental diet x2 weeks, repeat CT in 2 weeks with oral and IV contrast and stop IV Abx.  Dysphagia/Odynophagia: present x2 weeks, with solids and liquids. EGD was considered, however, advised by Dr. Constance Haw to hold off on proceeding with this until pt has 2 week repeat CT. Tolerating liquid diet at this time and will be d/ced on elemental diet.   Hgb was 9.2 yesterday, 9.3 this morning. likely due to hemodilution as WBC and Plt were  also down, yesterday, plt trending back up today. No overt GI bleeding during admission, pt did report some black stools prior to hospitalization. He has had no prior EGD. ERCP in 2019 with unremarkable findings of upper GI tract. Last colonscopy 2019 with 67mm sessile polyp, due for repeat in 2024. Patient will to follow up with primary GI on outpatient basis.   Plan: Continue PPI daily Repeat CT in 2 weeks with oral/IV contrast, per Dr. Constance Haw Avoid Ozempic Consider EGD after repeat CT in 2 weeks ([facility] outpatient) Elemental diet x2 weeks Monitor H&H/overt GI bleeding   LOS: 5 days    01/02/2021, 9:00 AM  Tyreesha Maharaj L. Alver Sorrow, MSN, APRN, AGNP-C Adult-Gerontology Nurse Practitioner [facility]

## 2021-01-02 NOTE — TOC Initial Note (Addendum)
Transition of Care [facility]) - Initial/Assessment Note    Patient Details  Name: Nicholas Gray MRN: [id number] Date of Birth: 07-05-1960  Transition of [facility]) CM/SW Contact:    Boneta Lucks, RN Phone Number: 01/02/2021, 2:23 PM  Clinical Narrative:     Patient admitted with Pneumatosis intestinalis. TOC consulted for assistance with supplement needed ( Vital 1.5) TOC contacted [employer], [employer], reviewed multiple sites for coupons. [employer] will take 2 weeks and need approval. There are no local sellers, will need to be ordered. Spoke with Caryl Pina at [employer]. No luck in finding assistance in cost of supplement needed. [employer] does not stock.  [facility] price is $206 for 24 bottle, with a deliver date of 10/7. TOC confirmed his insurance will not pay, due to no feeding tube.  Team updated. Recommending to refer to Dietician for any further resources.    Expected Discharge Plan: Home/Self Care Barriers to Discharge: Continued Medical Work up, Other (must enter comment) (Supplement nutrition needed)   Patient Goals and CMS Choice      Expected Discharge Plan and Services Expected Discharge Plan: Home/Self Care       Activities of Daily Living Home Assistive Devices/Equipment: None ADL Screening (condition at time of admission) Patient's cognitive ability adequate to safely complete daily activities?: Yes Is the patient deaf or have difficulty hearing?: No Does the patient have difficulty seeing, even when wearing glasses/contacts?: Yes Does the patient have difficulty concentrating, remembering, or making decisions?: No Patient able to express need for assistance with ADLs?: Yes Does the patient have difficulty dressing or bathing?: No Independently performs ADLs?: Yes (appropriate for developmental age) Does the patient have difficulty walking or climbing stairs?: Yes Weakness of Legs: Both Weakness of Arms/Hands: None  Permission Sought/Granted     Emotional Assessment      Admission diagnosis:  Pneumatosis intestinalis [K63.89] Pain of upper abdomen [R10.10] Patient Active Problem List   Diagnosis Date Noted   Dysphagia    Pneumatosis intestinalis 12/28/2020   Hyponatremia 12/28/2020   Hyperkalemia 12/28/2020   Acute renal injury ([place]) 12/28/2020   Dehydration 12/28/2020   Choledocholithiasis 11/06/2017   Bile leak    Pain of upper abdomen    Chronic systolic CHF (congestive heart failure) ([place]) 09/03/2017   ACTINIC KERATOSIS 11/01/2009   Dilated cardiomyopathy ([place]) 06/12/2009   OSA (obstructive sleep apnea) 05/02/2009   Morbid obesity ([place]) 01/31/2009   LBBB (left bundle branch block) 12/25/2008   HYPERLIPIDEMIA 11/22/2008   COLONIC POLYPS 10/19/2008   Essential hypertension 10/19/2008   GERD 10/19/2008   DIVERTICULITIS, COLON 10/19/2008   PCP:  [place]:   [place], [place] [address] [place] [address] Phone: [phone] Fax: [phone]  [facility] [place], [place] - [address] [place] [address] Phone: [phone] Fax: [phone]  Readmission Risk Interventions Readmission Risk Prevention Plan 01/02/2021  Medication Screening Complete  Transportation Screening Complete  Some recent data might be hidden

## 2021-01-03 ENCOUNTER — Encounter (HOSPITAL_COMMUNITY): Payer: Self-pay

## 2021-01-03 DIAGNOSIS — I10 Essential (primary) hypertension: Secondary | ICD-10-CM

## 2021-01-03 LAB — CBC
HCT: 28 % — ABNORMAL LOW (ref 39.0–52.0)
Hemoglobin: 9.3 g/dL — ABNORMAL LOW (ref 13.0–17.0)
MCH: 32.6 pg (ref 26.0–34.0)
MCHC: 33.2 g/dL (ref 30.0–36.0)
MCV: 98.2 fL (ref 80.0–100.0)
Platelets: 155 10*3/uL (ref 150–400)
RBC: 2.85 MIL/uL — ABNORMAL LOW (ref 4.22–5.81)
RDW: 12.2 % (ref 11.5–15.5)
WBC: 7.8 10*3/uL (ref 4.0–10.5)
nRBC: 0 % (ref 0.0–0.2)

## 2021-01-03 LAB — GLUCOSE, CAPILLARY
Glucose-Capillary: 117 mg/dL — ABNORMAL HIGH (ref 70–99)
Glucose-Capillary: 131 mg/dL — ABNORMAL HIGH (ref 70–99)

## 2021-01-03 LAB — BASIC METABOLIC PANEL
Anion gap: 9 (ref 5–15)
BUN: 10 mg/dL (ref 8–23)
CO2: 24 mmol/L (ref 22–32)
Calcium: 8.1 mg/dL — ABNORMAL LOW (ref 8.9–10.3)
Chloride: 100 mmol/L (ref 98–111)
Creatinine, Ser: 1.03 mg/dL (ref 0.61–1.24)
GFR, Estimated: >60 mL/min (ref >60)
Glucose, Bld: 95 mg/dL (ref 70–99)
Potassium: 4.2 mmol/L (ref 3.5–5.1)
Sodium: 133 mmol/L — ABNORMAL LOW (ref 135–145)

## 2021-01-03 LAB — MAGNESIUM: Magnesium: 1.8 mg/dL (ref 1.7–2.4)

## 2021-01-03 MED ORDER — CARVEDILOL 3.125 MG PO TABS: 3.1250 mg | ORAL_TABLET | Freq: Two times a day (BID) | ORAL | 0 refills | Status: DC

## 2021-01-03 MED ORDER — PANTOPRAZOLE SODIUM 40 MG PO TBEC: 40.0000 mg | DELAYED_RELEASE_TABLET | Freq: Every day | ORAL | 1 refills | Status: AC

## 2021-01-03 MED ORDER — ENTRESTO 97-103 MG PO TABS
0.5000 | ORAL_TABLET | Freq: Two times a day (BID) | ORAL | 3 refills | Status: DC
Start: 2021-01-04 — End: 2021-03-04

## 2021-01-03 NOTE — Progress Notes (Signed)
  Subjective: Tolerating diet well.  Denies any abdominal pain.  Objective: Vital signs in last 24 hours: Temp:  [98.2 F (36.8 C)-98.6 F (37 C)] 98.6 F (37 C) (09/29 0530) Pulse Rate:  [73-95] 75 (09/29 0530) Resp:  [17-20] 17 (09/29 0530) BP: (106-135)/(55-72) 110/55 (09/29 0530) SpO2:  [96 %-99 %] 96 % (09/29 0530) Last BM Date: 01/01/21  Intake/Output from previous day: 09/28 0701 - 09/29 0700 In: 2909.9 [P.O.:240; I.V.:1958.9; NG/GT:711] Out: 1500 [Urine:1500] Intake/Output this shift: No intake/output data recorded.  General appearance: alert, cooperative, and no distress GI: soft, non-tender; bowel sounds normal; no masses,  no organomegaly  Lab Results:  Recent Labs    01/02/21 0501 01/03/21 0500  WBC 6.6 7.8  HGB 9.3* 9.3*  HCT 27.4* 28.0*  PLT 138* 155   BMET Recent Labs    01/02/21 0501 01/03/21 0500  NA 131* 133*  K 4.3 4.2  CL 100 100  CO2 24 24  GLUCOSE 94 95  BUN 14 10  CREATININE 1.05 1.03  CALCIUM 7.9* 8.1*   PT/INR No results for input(s): LABPROT, INR in the last 72 hours.  Studies/Results: No results found.  Anti-infectives: Anti-infectives (From admission, onward)    Start     Dose/Rate Route Frequency Ordered Stop   12/29/20 1600  piperacillin-tazobactam (ZOSYN) IVPB 3.375 g  Status:  Discontinued        3.375 g 12.5 mL/hr over 240 Minutes Intravenous Every 8 hours 12/29/20 0735 01/01/21 0857   12/29/20 0800  piperacillin-tazobactam (ZOSYN) IVPB 3.375 g        3.375 g 100 mL/hr over 30 Minutes Intravenous  Once 12/29/20 0734 12/29/20 0949   12/28/20 2100  metroNIDAZOLE (FLAGYL) IVPB 500 mg  Status:  Discontinued        500 mg 100 mL/hr over 60 Minutes Intravenous Every 12 hours 12/28/20 2059 12/29/20 0734       Assessment/Plan: Impression: Pneumatosis intestinalis, benign.  No need for surgical intervention. Plan: Elemental diet supplement being set up by nutrition.  Okay for discharge from surgery standpoint once  this has been set up.  Follow-up with Dr. Constance Haw as an outpatient.  Patient has not been put back on his cardiac meds yet.  Will discuss with Dr. Wynetta Emery.  LOS: 6 days    Aviva Signs 01/03/2021

## 2021-01-03 NOTE — Progress Notes (Signed)
Nutrition Follow up   INTERVENTION:  Continue clear liquids and   Vital 1.5 - 237 ml (8 oz) orally 6 times daily for 1 week.  ProSource Plus 30 ml TID (100 kcal, 15 gr protein) each 30 ml  When week of Elemental diet is completed-transition to Low FODMAP diet which has been reviewed with pt and spouse. Additional questions were addressed this morning.  Patient to follow up with Dr Constance Haw as outpatient.   NUTRITION DIAGNOSIS:   Inadequate oral intake related to acute illness (pneumatosis intestinalis -requiring elemental diet) as evidenced by energy intake < or equal to 50% for > or equal to 5 days.   GOAL:  Patient will meet greater than or equal to 90% of their needs  MONITOR:  PO intake, Supplement acceptance, Labs, Weight trends   ASSESSMENT: Patient is an obese 60 yo male with hx of GERD, Cardiomyopathy, CHF, HTN, Diverticulosis.   Presents with complaint of epigastric pain, decreased appetite, swallow difficulty and weight loss. AKI, hypotension. Required temporary pressor support.   9/23, 9/24 and 9/26 CT abdomen /pelvis - pneumatosis intestinalis 9/24 Central line placed.   Patient sitting up in bed and family present. Part of his treatment plan will include an elemental diet for 2 weeks. Vital 1.5 vanilla is recommended. Patient tasted formula and expressed concern about consuming this as primary nutrition source. Patient currently on clear liquids-unsure at this time if he will be discharged on clears.    Patient weight has decreased from 124.3 kg to current 116.8 kg (6%) the past 5-6 weeks.  Medications reviewed and include: Protonix, levothyroxine, novolog.  IV- NS @100  ml/hr.   CBG (last 3)  9/23-A1C-6.2% Recent Labs    01/02/21 1649 01/02/21 2126 01/03/21 0720  GLUCAP 137* 113* 117*     Labs:  BMP Latest Ref Rng & Units 01/03/2021 01/02/2021 01/01/2021  Glucose 70 - 99 mg/dL 95 94 79  BUN 8 - 23 mg/dL 10 14 23   Creatinine 0.61 - 1.24 mg/dL 1.03 1.05  1.20  Sodium 135 - 145 mmol/L 133(L) 131(L) 131(L)  Potassium 3.5 - 5.1 mmol/L 4.2 4.3 4.3  Chloride 98 - 111 mmol/L 100 100 100  CO2 22 - 32 mmol/L 24 24 25   Calcium 8.9 - 10.3 mg/dL 8.1(L) 7.9(L) 7.8(L)    Diet Order:   Diet Order             Diet clear liquid Room service appropriate? Yes; Fluid consistency: Thin  Diet effective now                   EDUCATION NEEDS:  Education needs have been addressed  Skin:  Skin Assessment: Reviewed RN Assessment  Last BM:  9/27 type 7   Height:   Ht Readings from Last 1 Encounters:  12/28/20 5\' 10"  (1.778 m)    Weight:   Wt Readings from Last 1 Encounters:  01/01/21 116.8 kg    Ideal Body Weight:   75 kg  BMI:  Body mass index is 36.95 kg/m.  Estimated Nutritional Needs:   Kcal:  2300-2500  Protein:  150-165 gr  Fluid:  2 liters daily   Colman Cater MS,RD,CSG,LDN Contact: Shea Evans

## 2021-01-03 NOTE — Progress Notes (Signed)
Pt concerned with whether to resume CPAP at home. Verified with Wynetta Emery MD he states to wait/hold until hospital follow up with Dr Constance Haw. Pt verbalized understanding. Discharged pt by wheelchair by private vehicle.

## 2021-01-03 NOTE — Discharge Summary (Signed)
Physician Discharge Summary  Nicholas Gray CZY:[id number] DOB: 05-Jan-1961 DOA: 12/28/2020  PCP: [facility]  Admit date: 12/28/2020 Discharge date: 01/03/2021  Admitted From:  HOME  Disposition: HOME    Recommendations for Outpatient Follow-up:  Follow up with Dr. Constance Haw on 10/11 as already scheduled   Discharge Condition: STABLE   CODE STATUS: FULL  DIET: elemental feed   Brief Hospitalization Summary: Please see all hospital notes, images, labs for full details of the hospitalization. ADMISSION HPI: Nicholas Gray is a 60 y.o. male with a history of cardiomyopathy and HFrEF with EF of 45% on echo 11/2020, GERD, hypertension, hyperlipidemia, hypothyroidism, history of cholecystectomy in 2019.  Due to obesity and hyperlipidemia, the patient was placed on Ozempic around 8/18 shortly afterwards, the patient started having abdominal pain with symptoms of food or liquid getting stuck in his esophagus whenever he eats or drinks anything.  The symptoms have been worsening to the point where he does not want to eat or drink anything.  In addition, the patient has mild constant pain just superior to his umbilicus.  He does have an increase in this pain mildly when he eats or drinks.  No other palliating or provoking factors.  He denies fevers but does feel quite cold and having some shaking.  Denies chest pain, shortness of breath.  Has been having loose stools.  No blood in his stools.   Emergency Department Course: Sodium 126, potassium 5.9, bicarb 12, BUN 124, creatinine 3.34.  White count 15.5.  CT of the abdomen without contrast shows pneumatosis intestinalis of the small intestines without evidence of inflammation.  There is also diverticulosis without infection.  HOSPITAL COURSE  by problem list   AKI with multiple electrolyte disturbances -initial metabolic acidosis has resolved -ceased bicarb drip -iv fluid normal saline continues as current sodium is 9/28 131 vs  9/23 126 -continue to monitor and repeat lab tomorrow morning     Active Problems:   Pneumatosis intestinalis of unknown cause  -Repeated ct scan shows marked improvement 9/27 -Pt was treated with IV zosyn thru 9/27 -advanced from liquid to elemental diet which he tolerated and will discharge home on to continue until he sees Dr. Constance Haw on 10/11.  Arrangements made for him to receive the elemental feed by picking up at [facility]. It has been paid for.  Pt understands that he needs to go and pick it up today.     Circulatory shock - RESOLVED -resolved -stopped norepinephrine 9/27   Hypomagnesemia - TREATED  -09/28 1.6   Hyponatremia -  - Diuretics were held and he was treated with IV fluid saline -  9/23 126, 9/28 131 -elemental diet    SIADH -?secondary to AKI -resolved with treatment of AKI     Hyperkalemia - RESOLVED -secondary to prerenal aki -9/23 5.9, 9/28 4.3 -resolved     Essential hypertension -held home antihypertensives due to soft BPs - resume some BP meds at reduced doses to slowly restart - follow up with cardiology office and PCP     Dilated cardiomyopathy ([place]) -slowly resume home meds, coreg reduced to 3.125 mg BID, entresto dose reduced by 50%     LBBB (left bundle branch block) -chronic, monitored     OSA (obstructive sleep apnea) -cpap used at night     Chronic systolic CHF (congestive heart failure) -resuming home meds at reduced doses due to soft BPs   Hypothyroidism -resume home levothyroxine     Discharge Diagnoses:  Principal Problem:   Pneumatosis intestinalis Active Problems:   Essential hypertension   Dilated cardiomyopathy (HCC)   LBBB (left bundle branch block)   OSA (obstructive sleep apnea)   Chronic systolic CHF (congestive heart failure) (HCC)   Pain of upper abdomen   Hyponatremia   Hyperkalemia   Acute renal injury ([place])   Dehydration   Dysphagia   Discharge Instructions: Discharge Instructions      Ambulatory referral to Cardiology   Complete by: As directed    [place] as of 01/03/2021       Reactions   Ozempic (0.25 Or 0.5 Mg-dose) [semaglutide(0.25 Or 0.5mg -dos)]    GI upset on 0.25mg  dose        Medication List     STOP taking these medications    Farxiga 10 MG Tabs tablet Generic drug: dapagliflozin propanediol   PEPCID PO   predniSONE 10 MG tablet Commonly known as: DELTASONE   spironolactone 25 MG tablet Commonly known as: ALDACTONE       TAKE these medications    allopurinol 100 MG tablet Commonly known as: ZYLOPRIM Take 100 mg by mouth 2 (two) times daily.   carvedilol 3.125 MG tablet Commonly known as: COREG Take 1 tablet (3.125 mg total) by mouth 2 (two) times daily with a meal. What changed:  medication strength See the new instructions.   Entresto 97-103 MG Generic drug: sacubitril-valsartan Take 0.5 tablets by mouth 2 (two) times daily. Start taking on: January 04, 2021 What changed: how much to take   furosemide 20 MG tablet Commonly known as: LASIX Take 1 tablet (20 mg total) by mouth as needed for fluid or edema.   levothyroxine 50 MCG tablet Commonly known as: SYNTHROID Take 50 mcg by mouth daily.   pantoprazole 40 MG tablet Commonly known as: Protonix Take 1 tablet (40 mg total) by mouth daily.   rosuvastatin 10 MG tablet Commonly known as: Crestor Take 1 tablet (10 mg total) by mouth daily.        Follow-up Information     Virl Cagey, MD. Go on 01/15/2021.   Specialty: General Surgery Why: Hospital Follow Up Contact information: [address] [place] [facility] [id number] [phone]                Allergies  Allergen Reactions   Ozempic (0.25 Or 0.5 Mg-Dose) [Semaglutide(0.25 Or 0.5mg -Dos)]     GI upset on 0.25mg  dose   Allergies as of 01/03/2021       Reactions   Ozempic (0.25 Or 0.5 Mg-dose) [semaglutide(0.25 Or 0.5mg -dos)]    GI upset on 0.25mg  dose         Medication List     STOP taking these medications    Farxiga 10 MG Tabs tablet Generic drug: dapagliflozin propanediol   PEPCID PO   predniSONE 10 MG tablet Commonly known as: DELTASONE   spironolactone 25 MG tablet Commonly known as: ALDACTONE       TAKE these medications    allopurinol 100 MG tablet Commonly known as: ZYLOPRIM Take 100 mg by mouth 2 (two) times daily.   carvedilol 3.125 MG tablet Commonly known as: COREG Take 1 tablet (3.125 mg total) by mouth 2 (two) times daily with a meal. What changed:  medication strength See the new instructions.   Entresto 97-103 MG Generic drug: sacubitril-valsartan Take 0.5 tablets by mouth 2 (two) times daily. Start taking on: January 04, 2021 What changed: how much to take  furosemide 20 MG tablet Commonly known as: LASIX Take 1 tablet (20 mg total) by mouth as needed for fluid or edema.   levothyroxine 50 MCG tablet Commonly known as: SYNTHROID Take 50 mcg by mouth daily.   pantoprazole 40 MG tablet Commonly known as: Protonix Take 1 tablet (40 mg total) by mouth daily.   rosuvastatin 10 MG tablet Commonly known as: Crestor Take 1 tablet (10 mg total) by mouth daily.        Procedures/Studies: CT ABDOMEN PELVIS WO CONTRAST  Result Date: 12/31/2020 CLINICAL DATA:  F/U pneumatosis, epigastric pain x 1 week. Pneumatosis intestinalis EXAM: CT ABDOMEN AND PELVIS WITHOUT CONTRAST TECHNIQUE: Multidetector CT imaging of the abdomen and pelvis was performed following the standard protocol without IV contrast. COMPARISON:  CT abdomen pelvis 12/29/2020 FINDINGS: Lower chest: No acute abnormality. Hepatobiliary: No focal liver abnormality. No gallstones, gallbladder wall thickening, or pericholecystic fluid. No biliary dilatation. Pancreas: No focal lesion. Normal pancreatic contour. No surrounding inflammatory changes. No main pancreatic ductal dilatation. Spleen: Normal in size without focal abnormality.  Adrenals/Urinary Tract: No adrenal nodule bilaterally. No nephrolithiasis and no hydronephrosis. No definite contour-deforming renal mass. No ureterolithiasis or hydroureter. The urinary bladder is unremarkable. Stomach/Bowel: PO contrast reaches the cecum. Stomach is within normal limits. No evidence of bowel wall thickening or dilatation. Trace residual pneumatosis along the small bowel in the right abdomen (2:30 1-37). Few scattered colonic diverticula. Short normal in caliber appendix. Vascular/Lymphatic: No abdominal aorta or iliac aneurysm. Mild atherosclerotic plaque of the aorta and its branches. No abdominal, pelvic, or inguinal lymphadenopathy. Reproductive: Prostate is unremarkable. Other: Mild mesenteric edema within the mid abdomen (5:50, 2:35). No intraperitoneal free fluid. No intraperitoneal free gas. No organized fluid collection. Musculoskeletal: No abdominal wall hernia or abnormality. No suspicious lytic or blastic osseous lesions. No acute displaced fracture. Multilevel degenerative changes of the spine. IMPRESSION: 1. Trace residual pneumatosis along the small bowel in the right abdomen. Differential diagnosis includes gas within the mesenteric veins. Limited evaluation on this noncontrast study. As previously stated, findings favored to represent benign pneumatosis intestinalis. 2. Mild mesenteric edema within the mid abdomen of unclear etiology. 3. Colonic diverticulosis with no acute diverticulitis. Electronically Signed   By: Iven Finn M.D.   On: 12/31/2020 16:16   CT ABDOMEN PELVIS WO CONTRAST  Result Date: 12/29/2020 CLINICAL DATA:  Abdominal pain, follow-up pneumatosis EXAM: CT ABDOMEN AND PELVIS WITHOUT CONTRAST TECHNIQUE: Multidetector CT imaging of the abdomen and pelvis was performed following the standard protocol without IV contrast. COMPARISON:  12/28/2020 FINDINGS: Lower chest: No acute abnormality. Hepatobiliary: No solid liver abnormality is seen. No gallstones,  gallbladder wall thickening, or biliary dilatation. Pancreas: Unremarkable. No pancreatic ductal dilatation or surrounding inflammatory changes. Spleen: Normal in size without significant abnormality. Adrenals/Urinary Tract: Adrenal glands are unremarkable. Kidneys are normal, without renal calculi, solid lesion, or hydronephrosis. Bladder is unremarkable. Stomach/Bowel: Stomach is within normal limits. There is redemonstrated, diffuse pneumatosis of the small bowel, involving almost the entirety of its length, and similar in appearance to prior examination. Oral enteric contrast has transited to the cecum. No distension or inflammatory thickening of bowel wall. Appendix appears diminutive although normal. Sigmoid diverticulosis. Vascular/Lymphatic: Aortic atherosclerosis. No enlarged abdominal or pelvic lymph nodes. Reproductive: No mass or other significant abnormality. Other: Fat containing bilateral inguinal hernias. No abdominopelvic ascites. Musculoskeletal: No acute or significant osseous findings. IMPRESSION: 1. There is redemonstrated, diffuse pneumatosis of the small bowel, involving almost the entirety of its length, and similar in appearance  to prior examination. Oral enteric contrast has transited to the cecum, and there is no distention or inflammatory thickening of bowel wall. Findings favor benign pneumatosis intestinalis. 2.  Sigmoid diverticulosis. Aortic Atherosclerosis (ICD10-I70.0). Electronically Signed   By: Eddie Candle M.D.   On: 12/29/2020 13:20   CT ABDOMEN PELVIS WO CONTRAST  Result Date: 12/28/2020 CLINICAL DATA:  Epigastric abdominal pain for 1 month. EXAM: CT ABDOMEN AND PELVIS WITHOUT CONTRAST TECHNIQUE: Multidetector CT imaging of the abdomen and pelvis was performed following the standard protocol without IV contrast. COMPARISON:  November 06, 2017. FINDINGS: Lower chest: No acute abnormality. Hepatobiliary: No focal liver abnormality is seen. Status post cholecystectomy. No  biliary dilatation. Pancreas: Unremarkable. No pancreatic ductal dilatation or surrounding inflammatory changes. Spleen: Normal in size without focal abnormality. Adrenals/Urinary Tract: Adrenal glands are unremarkable. Kidneys are normal, without renal calculi, focal lesion, or hydronephrosis. Bladder is unremarkable. Stomach/Bowel: Stomach is unremarkable. The appendix is not visualized. Diverticulosis of the sigmoid colon is noted without inflammation. The colon is nondilated. However, there is interval development of pneumatosis involving small bowel loops without inflammatory change and minimal proximal small bowel dilatation. Vascular/Lymphatic: Aortic atherosclerosis. No enlarged abdominal or pelvic lymph nodes. Reproductive: Prostate is unremarkable. Other: Bilateral fat containing inguinal hernias are again noted. No ascites is noted. Musculoskeletal: No acute or significant osseous findings. IMPRESSION: Interval development of extensive pneumatosis involving small bowel loops without definite inflammatory change. Minimal proximal small bowel dilatation is noted. This may simply represent benign pneumatosis, but ischemic bowel can not be excluded although felt to be unlikely. Clinical correlation is recommended. Differential diagnosis for benign pneumatosis can include pulmonary disease, intestinal inflammation iatrogenic due to procedures or certain medications. Critical Value/emergent results were called by telephone at the time of interpretation on 12/28/2020 at 7:21 pm to provider [facility] ZAMMIT , who verbally acknowledged these results. Electronically Signed   By: Marijo Conception M.D.   On: 12/28/2020 19:21   DG Chest 2 View  Result Date: 12/24/2020 CLINICAL DATA:  Upper back and thoracic spine pain, pain between shoulder blades intermittently for several months worsened in past few weeks, history CHF, hypertension, former smoker EXAM: CHEST - 2 VIEW COMPARISON:  11/06/2017; correlation MR heart  10/03/2019 FINDINGS: Normal heart size and pulmonary vascularity. Mildly prominent fat pad at cardiac apex unchanged from prior MR. Lungs clear. No pulmonary infiltrate, pleural effusion, or pneumothorax. Osseous structures unremarkable. IMPRESSION: No acute abnormalities. Electronically Signed   By: Lavonia Dana M.D.   On: 12/24/2020 15:44   DG Chest Port 1 View  Result Date: 12/29/2020 CLINICAL DATA:  60 year old male central line placement. EXAM: PORTABLE CHEST 1 VIEW COMPARISON:  12/24/2020 chest radiographs. FINDINGS: Portable AP upright view at 0752 hours. Left IJ approach central line in place, tip at the level of the lower SVC, about 1 vertebral body height below the carina. No pneumothorax. Mediastinal contours remain normal. Lung ventilation is stable and within normal limits. No acute osseous abnormality identified. Negative visible bowel gas. IMPRESSION: Left IJ approach central line placed with tip at the lower SVC level. No pneumothorax or adverse features. Electronically Signed   By: Genevie Ann M.D.   On: 12/29/2020 08:19     Subjective: Pt feels well to go home.   Discharge Exam: Vitals:   01/02/21 2117 01/03/21 0530  BP: 135/72 (!) 110/55  Pulse: 73 75  Resp: 18 17  Temp: 98.2 F (36.8 C) 98.6 F (37 C)  SpO2: 99% 96%   Vitals:  01/02/21 0510 01/02/21 1350 01/02/21 2117 01/03/21 0530  BP: 101/61 106/71 135/72 (!) 110/55  Pulse: 80 95 73 75  Resp: 19 20 18 17   Temp: 97.7 F (36.5 C) 98.2 F (36.8 C) 98.2 F (36.8 C) 98.6 F (37 C)  TempSrc: Oral Oral Oral   SpO2: 95% 97% 99% 96%  Weight:      Height:       General: Pt is alert, awake, not in acute distress Cardiovascular: RRR, S1/S2 +, no rubs, no gallops Respiratory: CTA bilaterally, no wheezing, no rhonchi Abdominal: Soft, NT, ND, bowel sounds + Extremities: no edema, no cyanosis   The results of significant diagnostics from this hospitalization (including imaging, microbiology, ancillary and laboratory) are  listed below for reference.     Microbiology: Recent Results (from the past 240 hour(s))  Resp Panel by RT-PCR (Flu A&B, Covid) Nasopharyngeal Swab     Status: None   Collection Time: 12/28/20  9:39 PM   Specimen: Nasopharyngeal Swab; Nasopharyngeal(NP) swabs in vial transport medium  Result Value Ref Range Status   SARS Coronavirus 2 by RT PCR NEGATIVE NEGATIVE Final    Comment: (NOTE) SARS-CoV-2 target nucleic acids are NOT DETECTED.  The SARS-CoV-2 RNA is generally detectable in upper respiratory specimens during the acute phase of infection. The lowest concentration of SARS-CoV-2 viral copies this assay can detect is 138 copies/mL. A negative result does not preclude SARS-Cov-2 infection and should not be used as the sole basis for treatment or other patient management decisions. A negative result may occur with  improper specimen collection/handling, submission of specimen other than nasopharyngeal swab, presence of viral mutation(s) within the areas targeted by this assay, and inadequate number of viral copies(<138 copies/mL). A negative result must be combined with clinical observations, patient history, and epidemiological information. The expected result is Negative.  Fact Sheet for Patients:  EntrepreneurPulse.com.au  Fact Sheet for Healthcare Providers:  IncredibleEmployment.be  This test is no t yet approved or cleared by the [place] FDA and  has been authorized for detection and/or diagnosis of SARS-CoV-2 by FDA under an Emergency Use Authorization (EUA). This EUA will remain  in effect (meaning this test can be used) for the duration of the COVID-19 declaration under Section 564(b)(1) of the Act, 21 U.S.C.section 360bbb-3(b)(1), unless the authorization is terminated  or revoked sooner.       Influenza A by PCR NEGATIVE NEGATIVE Final   Influenza B by PCR NEGATIVE NEGATIVE Final    Comment: (NOTE) The Xpert Xpress  SARS-CoV-2/FLU/RSV plus assay is intended as an aid in the diagnosis of influenza from Nasopharyngeal swab specimens and should not be used as a sole basis for treatment. Nasal washings and aspirates are unacceptable for Xpert Xpress SARS-CoV-2/FLU/RSV testing.  Fact Sheet for Patients: EntrepreneurPulse.com.au  Fact Sheet for Healthcare Providers: IncredibleEmployment.be  This test is not yet approved or cleared by the [place] FDA and has been authorized for detection and/or diagnosis of SARS-CoV-2 by FDA under an Emergency Use Authorization (EUA). This EUA will remain in effect (meaning this test can be used) for the duration of the COVID-19 declaration under Section 564(b)(1) of the Act, 21 U.S.C. section 360bbb-3(b)(1), unless the authorization is terminated or revoked.  Performed at [facility], [address], [place], [place] [address]   MRSA Next Gen by PCR, Nasal     Status: None   Collection Time: 12/28/20 11:59 PM   Specimen: Nasal Mucosa; Nasal Swab  Result Value Ref Range Status  MRSA by PCR Next Gen NOT DETECTED NOT DETECTED Final    Comment: (NOTE) The GeneXpert MRSA Assay (FDA approved for NASAL specimens only), is one component of a comprehensive MRSA colonization surveillance program. It is not intended to diagnose MRSA infection nor to guide or monitor treatment for MRSA infections. Test performance is not FDA approved in patients less than 48 years old. Performed at [facility], [address] [place], [place] [address]      Labs: BNP (last 3 results) Recent Labs    11/20/20 0923  BNP 58.5   Basic Metabolic Panel: Recent Labs  Lab 12/30/20 0400 12/31/20 0410 01/01/21 0446 01/02/21 0501 01/03/21 0500  NA 129* 132* 131* 131* 133*  K 4.5 4.5 4.3 4.3 4.2  CL 102 100 100 100 100  CO2 20* 24 25 24 24   GLUCOSE 121* 96 79 94 95  BUN 59* 40* 23 14 10   CREATININE 1.64* 1.58* 1.20 1.05 1.03  CALCIUM 8.1*  8.4* 7.8* 7.9* 8.1*  MG 1.4* 2.1 1.7 1.6* 1.8   Liver Function Tests: Recent Labs  Lab 12/28/20 1620  AST 18  ALT 27  ALKPHOS 43  BILITOT 0.2*  PROT 7.6  ALBUMIN 4.1   Recent Labs  Lab 12/28/20 1620  LIPASE 67*   No results for input(s): AMMONIA in the last 168 hours. CBC: Recent Labs  Lab 12/29/20 1901 12/30/20 0400 12/31/20 0410 01/01/21 0446 01/02/21 0501 01/03/21 0500  WBC 11.1* 12.0* 10.5 7.4 6.6 7.8  NEUTROABS 7.1  --   --   --   --   --   HGB 11.3* 10.8* 11.1* 9.2* 9.3* 9.3*  HCT 32.1* 31.8* 32.0* 27.0* 27.4* 28.0*  MCV 95.0 94.4 95.8 96.4 96.8 98.2  PLT 184 204 189 130* 138* 155   Cardiac Enzymes: No results for input(s): CKTOTAL, CKMB, CKMBINDEX, TROPONINI in the last 168 hours. BNP: Invalid input(s): POCBNP CBG: Recent Labs  Lab 01/02/21 1210 01/02/21 1649 01/02/21 2126 01/03/21 0720 01/03/21 1123  GLUCAP 107* 137* 113* 117* 131*   D-Dimer No results for input(s): DDIMER in the last 72 hours. Hgb A1c No results for input(s): HGBA1C in the last 72 hours. Lipid Profile No results for input(s): CHOL, HDL, LDLCALC, TRIG, CHOLHDL, LDLDIRECT in the last 72 hours. Thyroid function studies No results for input(s): TSH, T4TOTAL, T3FREE, THYROIDAB in the last 72 hours.  Invalid input(s): FREET3 Anemia work up No results for input(s): VITAMINB12, FOLATE, FERRITIN, TIBC, IRON, RETICCTPCT in the last 72 hours. Urinalysis    Component Value Date/Time   COLORURINE STRAW (A) 12/28/2020 2233   APPEARANCEUR CLEAR 12/28/2020 2233   LABSPEC 1.010 12/28/2020 2233   PHURINE 5.0 12/28/2020 2233   GLUCOSEU >=500 (A) 12/28/2020 2233   HGBUR SMALL (A) 12/28/2020 2233   BILIRUBINUR NEGATIVE 12/28/2020 2233   KETONESUR NEGATIVE 12/28/2020 2233   PROTEINUR NEGATIVE 12/28/2020 2233   NITRITE NEGATIVE 12/28/2020 2233   LEUKOCYTESUR NEGATIVE 12/28/2020 2233   Sepsis Labs Invalid input(s): PROCALCITONIN,  WBC,  LACTICIDVEN Microbiology Recent Results (from the  past 240 hour(s))  Resp Panel by RT-PCR (Flu A&B, Covid) Nasopharyngeal Swab     Status: None   Collection Time: 12/28/20  9:39 PM   Specimen: Nasopharyngeal Swab; Nasopharyngeal(NP) swabs in vial transport medium  Result Value Ref Range Status   SARS Coronavirus 2 by RT PCR NEGATIVE NEGATIVE Final    Comment: (NOTE) SARS-CoV-2 target nucleic acids are NOT DETECTED.  The SARS-CoV-2 RNA is generally detectable in upper respiratory specimens  during the acute phase of infection. The lowest concentration of SARS-CoV-2 viral copies this assay can detect is 138 copies/mL. A negative result does not preclude SARS-Cov-2 infection and should not be used as the sole basis for treatment or other patient management decisions. A negative result may occur with  improper specimen collection/handling, submission of specimen other than nasopharyngeal swab, presence of viral mutation(s) within the areas targeted by this assay, and inadequate number of viral copies(<138 copies/mL). A negative result must be combined with clinical observations, patient history, and epidemiological information. The expected result is Negative.  Fact Sheet for Patients:  EntrepreneurPulse.com.au  Fact Sheet for Healthcare Providers:  IncredibleEmployment.be  This test is no t yet approved or cleared by the [place] FDA and  has been authorized for detection and/or diagnosis of SARS-CoV-2 by FDA under an Emergency Use Authorization (EUA). This EUA will remain  in effect (meaning this test can be used) for the duration of the COVID-19 declaration under Section 564(b)(1) of the Act, 21 U.S.C.section 360bbb-3(b)(1), unless the authorization is terminated  or revoked sooner.       Influenza A by PCR NEGATIVE NEGATIVE Final   Influenza B by PCR NEGATIVE NEGATIVE Final    Comment: (NOTE) The Xpert Xpress SARS-CoV-2/FLU/RSV plus assay is intended as an aid in the diagnosis of  influenza from Nasopharyngeal swab specimens and should not be used as a sole basis for treatment. Nasal washings and aspirates are unacceptable for Xpert Xpress SARS-CoV-2/FLU/RSV testing.  Fact Sheet for Patients: EntrepreneurPulse.com.au  Fact Sheet for Healthcare Providers: IncredibleEmployment.be  This test is not yet approved or cleared by the [place] FDA and has been authorized for detection and/or diagnosis of SARS-CoV-2 by FDA under an Emergency Use Authorization (EUA). This EUA will remain in effect (meaning this test can be used) for the duration of the COVID-19 declaration under Section 564(b)(1) of the Act, 21 U.S.C. section 360bbb-3(b)(1), unless the authorization is terminated or revoked.  Performed at [facility], [address] [place], [place] [address]   MRSA Next Gen by PCR, Nasal     Status: None   Collection Time: 12/28/20 11:59 PM   Specimen: Nasal Mucosa; Nasal Swab  Result Value Ref Range Status   MRSA by PCR Next Gen NOT DETECTED NOT DETECTED Final    Comment: (NOTE) The GeneXpert MRSA Assay (FDA approved for NASAL specimens only), is one component of a comprehensive MRSA colonization surveillance program. It is not intended to diagnose MRSA infection nor to guide or monitor treatment for MRSA infections. Test performance is not FDA approved in patients less than 72 years old. Performed at [facility], [address] [place], [place] [address]    Time coordinating discharge: 40 mins   SIGNED:  Irwin Brakeman, MD  [facility] 01/03/2021, 11:49 AM How to contact the [facility] Attending or Consulting provider [place] or covering provider during after hours [place], for this patient?  Check the care team in [facility] and look for a) attending/consulting [facility] provider listed and b) the [facility] team listed Log into [email or web address] and use [place] universal password to access. If you do not have the password, please  contact the hospital operator. Locate the [facility] provider you are looking for under [facility] and page to a number that you can be directly reached. If you still have difficulty reaching the provider, please page the [facility] (Director on Call) for the Hospitalists listed on amion for assistance.

## 2021-01-03 NOTE — Discharge Instructions (Addendum)
PLEASE GO TO [place] TO PICK UP THE ELEMENTAL FEEDS TO USE AT HOME.    PLEASE FOLLOW UP WITH DR BRIDGES ON 10/11 AS SCHEDULED   IMPORTANT INFORMATION: PAY CLOSE ATTENTION   PHYSICIAN DISCHARGE INSTRUCTIONS  Follow with Primary care provider  [facility]ll[facility], [place]  and other consultants as instructed by your Hospitalist Physician  [place] IF SYMPTOMS COME BACK, WORSEN OR NEW PROBLEM DEVELOPS   Please note: You were cared for by a hospitalist during your hospital stay. Every effort will be made to forward records to your primary care provider.  You can request that your primary care provider send for your hospital records if they have not received them.  Once you are discharged, your primary care physician will handle any further medical issues. Please note that NO REFILLS for any discharge medications will be authorized once you are discharged, as it is imperative that you return to your primary care physician (or establish a relationship with a primary care physician if you do not have one) for your post hospital discharge needs so that they can reassess your need for medications and monitor your lab values.  Please get a complete blood count and chemistry panel checked by your Primary MD at your next visit, and again as instructed by your Primary MD.  Get Medicines reviewed and adjusted: Please take all your medications with you for your next visit with your Primary MD  Laboratory/radiological data: Please request your Primary MD to go over all hospital tests and procedure/radiological results at the follow up, please ask your primary care provider to get all Hospital records sent to his/her office.  In some cases, they will be blood work, cultures and biopsy results pending at the time of your discharge. Please request that your primary care provider follow up on these results.  If you are diabetic, please bring your blood sugar readings  with you to your follow up appointment with primary care.    Please call and make your follow up appointments as soon as possible.    Also Note the following: If you experience worsening of your admission symptoms, develop shortness of breath, life threatening emergency, suicidal or homicidal thoughts you must seek medical attention immediately by calling 911 or calling your MD immediately  if symptoms less severe.  You must read complete instructions/literature along with all the possible adverse reactions/side effects for all the Medicines you take and that have been prescribed to you. Take any new Medicines after you have completely understood and accpet all the possible adverse reactions/side effects.   Do not drive when taking Pain medications or sleeping medications (Benzodiazepines)  Do not take more than prescribed Pain, Sleep and Anxiety Medications. It is not advisable to combine anxiety,sleep and pain medications without talking with your primary care practitioner  Special Instructions: If you have smoked or chewed Tobacco  in the last 2 yrs please stop smoking, stop any regular Alcohol  and or any Recreational drug use.  Wear Seat belts while driving.  Do not drive if taking any narcotic, mind altering or controlled substances or recreational drugs or alcohol.

## 2021-01-03 NOTE — Progress Notes (Signed)
Discharge instructions given. Reenforced where to pick up vital supplementation @ [place] pt verbalized understanding. IV removed.

## 2021-01-04 ENCOUNTER — Other Ambulatory Visit (HOSPITAL_COMMUNITY): Payer: Self-pay

## 2021-01-16 ENCOUNTER — Ambulatory Visit (HOSPITAL_COMMUNITY)
Admission: RE | Admit: 2021-01-16 | Discharge: 2021-01-16 | Disposition: A | Discharge status: Home / Self Care | Payer: 59 | Source: Ambulatory Visit | Attending: General Surgery | Admitting: General Surgery

## 2021-01-16 ENCOUNTER — Other Ambulatory Visit: Payer: Self-pay

## 2021-01-16 DIAGNOSIS — K6389 Other specified diseases of intestine: Secondary | ICD-10-CM | POA: Diagnosis not present

## 2021-01-16 MED ORDER — IOHEXOL 300 MG/ML  SOLN
100.0000 mL | Freq: Once | INTRAMUSCULAR | Status: AC | PRN
  Administered 2021-01-16: 100 mL via INTRAVENOUS

## 2021-01-17 ENCOUNTER — Encounter: Payer: Self-pay | Admitting: General Surgery

## 2021-01-17 ENCOUNTER — Ambulatory Visit (INDEPENDENT_AMBULATORY_CARE_PROVIDER_SITE_OTHER): Payer: 59 | Admitting: General Surgery

## 2021-01-17 VITALS — BP 95/66 | HR 72 | Temp 97.5°F | Resp 14 | Ht 70.0 in | Wt 254.0 lb

## 2021-01-17 DIAGNOSIS — K6389 Other specified diseases of intestine: Secondary | ICD-10-CM

## 2021-01-17 NOTE — Progress Notes (Signed)
[facility]  Feeling good and having no pain. He is eating and increasing his FOD diet. CT done and no signs of pneumatosis.   BP 95/66   Pulse 72   Temp (!) 97.5 F (36.4 C) (Other (Comment))   Resp 14   Ht 5\' 10"  (1.778 m)   Wt 254 lb (115.2 kg)   SpO2 95%   BMI 36.45 kg/m  Soft, nondistended, nontender   Continue the FOD diet for about 2 more weeks, then incorporate in other foods. Ok to use CPAP. Call if worsening pain, distention, changes in bowel. If severe pain or changes, go to the ED.  Will call you in a month to check and see how you are doing.   Future Appointments  Date Time Provider [place]  01/18/2021  8:30 AM [facility] PA/NP [facility] None  02/20/2021  8:30 AM [facility] LAB [facility] None  02/21/2021  3:45 PM Constance Haw, Lanell Matar, MD [facility] None   Curlene Labrum, MD [facility] [address] [place], [place] [address] [phone] (office)

## 2021-01-17 NOTE — Patient Instructions (Addendum)
Continue the FOD diet for about 2 more weeks, then incorporate in other foods. Ok to use CPAP. Call if worsening pain, distention, changes in bowel. If severe pain or changes, go to the ED.  Will call you in a month to check and see how you are doing.

## 2021-01-18 ENCOUNTER — Ambulatory Visit (HOSPITAL_COMMUNITY)
Admission: RE | Admit: 2021-01-18 | Discharge: 2021-01-18 | Disposition: A | Discharge status: Home / Self Care | Payer: 59 | Source: Ambulatory Visit | Attending: Family Medicine | Admitting: Family Medicine

## 2021-01-18 ENCOUNTER — Other Ambulatory Visit: Payer: Self-pay

## 2021-01-18 ENCOUNTER — Encounter (HOSPITAL_COMMUNITY): Payer: Self-pay

## 2021-01-18 VITALS — BP 110/68 | HR 67 | Wt 255.2 lb

## 2021-01-18 DIAGNOSIS — Z79899 Other long term (current) drug therapy: Secondary | ICD-10-CM | POA: Insufficient documentation

## 2021-01-18 DIAGNOSIS — Z6836 Body mass index (BMI) 36.0-36.9, adult: Secondary | ICD-10-CM | POA: Insufficient documentation

## 2021-01-18 DIAGNOSIS — I428 Other cardiomyopathies: Secondary | ICD-10-CM | POA: Diagnosis not present

## 2021-01-18 DIAGNOSIS — E785 Hyperlipidemia, unspecified: Secondary | ICD-10-CM | POA: Diagnosis not present

## 2021-01-18 DIAGNOSIS — I10 Essential (primary) hypertension: Secondary | ICD-10-CM

## 2021-01-18 DIAGNOSIS — Z09 Encounter for follow-up examination after completed treatment for conditions other than malignant neoplasm: Secondary | ICD-10-CM | POA: Insufficient documentation

## 2021-01-18 DIAGNOSIS — Z9989 Dependence on other enabling machines and devices: Secondary | ICD-10-CM

## 2021-01-18 DIAGNOSIS — G4733 Obstructive sleep apnea (adult) (pediatric): Secondary | ICD-10-CM | POA: Diagnosis not present

## 2021-01-18 DIAGNOSIS — E669 Obesity, unspecified: Secondary | ICD-10-CM | POA: Diagnosis not present

## 2021-01-18 DIAGNOSIS — Z560 Unemployment, unspecified: Secondary | ICD-10-CM | POA: Diagnosis not present

## 2021-01-18 DIAGNOSIS — Z87891 Personal history of nicotine dependence: Secondary | ICD-10-CM | POA: Insufficient documentation

## 2021-01-18 DIAGNOSIS — I447 Left bundle-branch block, unspecified: Secondary | ICD-10-CM

## 2021-01-18 DIAGNOSIS — I110 Hypertensive heart disease with heart failure: Secondary | ICD-10-CM | POA: Insufficient documentation

## 2021-01-18 DIAGNOSIS — I5022 Chronic systolic (congestive) heart failure: Secondary | ICD-10-CM | POA: Diagnosis present

## 2021-01-18 DIAGNOSIS — Z7901 Long term (current) use of anticoagulants: Secondary | ICD-10-CM | POA: Insufficient documentation

## 2021-01-18 LAB — LIPID PANEL
Cholesterol: 98 mg/dL (ref 0–200)
HDL: 28 mg/dL — ABNORMAL LOW (ref >40)
LDL Cholesterol: 45 mg/dL (ref 0–99)
Total CHOL/HDL Ratio: 3.5 RATIO
Triglycerides: 126 mg/dL (ref <150)
VLDL: 25 mg/dL (ref 0–40)

## 2021-01-18 LAB — BASIC METABOLIC PANEL
Anion gap: 8 (ref 5–15)
BUN: 13 mg/dL (ref 8–23)
CO2: 26 mmol/L (ref 22–32)
Calcium: 8.3 mg/dL — ABNORMAL LOW (ref 8.9–10.3)
Chloride: 101 mmol/L (ref 98–111)
Creatinine, Ser: 1.1 mg/dL (ref 0.61–1.24)
GFR, Estimated: >60 mL/min (ref >60)
Glucose, Bld: 105 mg/dL — ABNORMAL HIGH (ref 70–99)
Potassium: 3.3 mmol/L — ABNORMAL LOW (ref 3.5–5.1)
Sodium: 135 mmol/L (ref 135–145)

## 2021-01-18 MED ORDER — DAPAGLIFLOZIN PROPANEDIOL 10 MG PO TABS: 10.0000 mg | ORAL_TABLET | Freq: Every day | ORAL | 4 refills | Status: DC

## 2021-01-18 NOTE — Patient Instructions (Addendum)
EKG was performed today  Labs were done today, if any labs are abnormal the clinic will call you  Your physician recommends that you return for lab work in: 10 days  Your physician recommends that you schedule a follow-up appointment in: 6 weeks and 3 months   RESTART Farxiga 10 mg daily  At the [facility], you and your health needs are our priority. As part of our continuing mission to provide you with exceptional heart care, we have created designated Provider Care Teams. These Care Teams include your primary Cardiologist (physician) and Advanced Practice Providers (APPs- Physician Assistants and Nurse Practitioners) who all work together to provide you with the care you need, when you need it.   You may see any of the following providers on your designated Care Team at your next follow up: Dr Glori Bickers Dr [name] Dr Patrice Paradise, NP Lyda Jester, [place] Ginnie Smart Audry Riles, PharmD   Please be sure to bring in all your medications bottles to every appointment.    If you have any questions or concerns before your next appointment please send [place] a message through [place] or call our office at [phone].    TO LEAVE A MESSAGE FOR THE NURSE SELECT OPTION 2, PLEASE LEAVE A MESSAGE INCLUDING: YOUR NAME DATE OF BIRTH CALL BACK NUMBER REASON FOR CALL**this is important as we prioritize the call backs  YOU WILL RECEIVE A CALL BACK THE SAME DAY AS LONG AS YOU CALL BEFORE 4:00 PM

## 2021-01-18 NOTE — Progress Notes (Signed)
Advanced Heart Failure Clinic Note   PCP: Redmond School, MD PCP-Cardiologist: Minus Breeding, MD  HF Cardiology: Dr. [name]  HPI: Nicholas Gray is a 60 y.o. male with chronic systolic CHF, NICM (Normal coronaries 2010), HTN, LBBB, Obesity, and OSA on CPAP.  He has been known to have a cardiomyopathy for the last 10 years or so.   Seen in [facility] office 08/13/17 and was overall feeling well. No new symptoms.  Echo 08/27/17 LVEF 25-30%, Grade 1 DD, Mild MR, Mild LAE, mild RV dilation, PA peak pressure 34 mm Hg.  Referred by Dr. Percival Spanish to CHF team for further evaluation and treatment of CHF.    Pt admitted 5/30 - 09/13/2017 with Chest/upper abdominal discomfort and pain. CT scan and HIDA consistent with acute cholecystitis. Underwent laproscopic chole on 09/07/17 and found to have gangrenous GB. 09/09/17 repeat HIDA scan showed bile leak. ERCP 09/11/17, but patient had failed stent placement. Considered for IR for drain placement, but decided on conservative management.  He was admitted again with RUQ pain in 7/19, had ERCP with bile leak and choledocholithiasis noted.  He had stone removal and stent placed.   LHC/RHC in 7/19 showed no significant CAD, optimized filling pressures and preserved cardiac output. Cardiac MRI in 11/19 showed improvement in LV EF to 51% with mildly decreased RV systolic function, no LGE.   Echo in 5/21 showed EF in the range of 40% though it is a technically difficult study.  There is septal-lateral dyssynchrony. Cardiac MRI in 6/21 showed LV EF 46% with septal-lateral dyssynchrony, RV EF 51%, no LGE.    Echo 8/22 EF 45% with mild diffuse hypokinesis and septal-lateral dyssynchrony, normal RV.   Admitted 9/23-9/29/22 with abdominal pain and dysphagia, found to have pneumatosis intestinalis of the small intestines. GDMT held due to low BP requiring low-dose NE. Per general surgery no need for surgical intervention and recommended elemental diet. Entresto, carvedilol and lasix  resumed at discharge; spiro and [place] held.  Today he returns for HF follow up. He is not very active but not SOB with ADLs. He is back to eating a regular diet now. Denies  CP, dizziness, edema, or PND/Orthopnea. Appetite ok. No fever or chills. Weight at home 248 pounds. Taking all medications. Started wearing CPAP back last night.   ECG (personally reviewed): Sinus rhythm with PVCs   Labs (6/19): K 4.2, creatinine 1.14 Labs (8/19): K 3.9, creatinine 1.14, normal LFTs Labs (01/05/2018): K 4.5 creatinine 1.23  Labs (11/19): K 3.9, creatinine 1.16 Labs (1/20): K 3.9, creatinine 1.13 Labs (3/21): K 4.2, creatinine 1.28, hgb 13.7, LDL 57, HDL 28 Labs (5/21): K 4.2, creatinine 1.33 Labs (11/21): K 4.1, creatinine 1.43 Labs (1/22): LDL 48 Labs (4/22): K 3.8, creatinine 1.16 Labs (9/22): K 4.2, creatinine 1.03  Social History: Lives in [place], [place]. 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 Denies heavy ETOH use.  Family history: He denies immediate family history of CHF, but thinks his maternal grandparents may have had "heart problems"   Review of systems complete and found to be negative unless listed in HPI.    Past Medical History: 1. Chronic systolic CHF: Suspected nonischemic cardiomyopathy (normal coronaries 2010).  - Echo 2012 with EF 30% - Echo 12/15 with EF 40-45% - Echo 2017 with EF 35-40% - Echo 08/27/17 LVEF 25-30%, Grade 1 DD, Mild MR, Mild LAE, mild RV dilation, PA peak pressure 34 mm Hg.  - LHC/RHC (7/19): No significant  coronary disease. RA 7, PA 39/12 mean 25, PCWP mean 11, CI 3.68, PVR 1.65 WU.  - Cardiac MRI (11/19): LV EF 51% with septal-lateral dyssynchrony, mild RV dilation with RV EF 39%, no LGE.  - Echo (5/21): Technically difficult, EF around 40% with diffuse hypokinesis and septal-lateral dyssynchrony, normal RV.  - Cardiac MRI (6/21): LV EF 46% with septal-lateral dyssynchrony, RV EF 51%, no LGE.  - Echo (8/22): EF 45% with  mild diffuse hypokinesis and septal-lateral dyssynchrony, normal RV.  2. HTN 3. LBBB, chronic - QRS 166 on EKG 09/04/17 4. Obesity Body mass index is 36.62 kg/m. 5. OSA: Reports compliance with his CPAP.  6. GERD 7. Cholecystitis: Underwent laproscopic chole on 09/07/17 and found to have gangrenous GB.  - 09/09/17 repeat HIDA scan showed bile leak.  - ERCP 09/11/17, but patient had failed stent placement. - ERCP 7/19 with sphincterotomy, stone removal, stent placement.   Current Outpatient Medications  Medication Sig Dispense Refill   allopurinol (ZYLOPRIM) 100 MG tablet Take 100 mg by mouth 2 (two) times daily.      carvedilol (COREG) 3.125 MG tablet Take 1 tablet (3.125 mg total) by mouth 2 (two) times daily with a meal. 60 tablet 0   furosemide (LASIX) 20 MG tablet Take 1 tablet (20 mg total) by mouth as needed for fluid or edema. 30 tablet 3   levothyroxine (SYNTHROID) 50 MCG tablet Take 50 mcg by mouth daily.     pantoprazole (PROTONIX) 40 MG tablet Take 1 tablet (40 mg total) by mouth daily. 30 tablet 1   rosuvastatin (CRESTOR) 10 MG tablet Take 1 tablet (10 mg total) by mouth daily. 30 tablet 11   sacubitril-valsartan (ENTRESTO) 97-103 MG Take 0.5 tablets by mouth 2 (two) times daily. 180 tablet 3   No current facility-administered medications for this encounter.   Allergies  Allergen Reactions   Ozempic (0.25 Or 0.5 Mg-Dose) [Semaglutide(0.25 Or 0.5mg -Dos)]     GI upset on 0.25mg  dose   BP 110/68   Pulse 67   Wt 115.8 kg   SpO2 97%   BMI 36.62 kg/m   Wt Readings from Last 3 Encounters:  01/18/21 115.8 kg  01/17/21 115.2 kg  01/01/21 116.8 kg    PHYSICAL EXAM: General:  NAD. No resp difficulty HEENT: Normal Neck: Supple. No JVD. Carotids 2+ bilat; no bruits. No lymphadenopathy or thryomegaly appreciated. Cor: PMI nondisplaced. Regular rate & rhythm. No rubs, gallops or murmurs. Lungs: Clear Abdomen: Obese, nontender, nondistended. No hepatosplenomegaly. No bruits or  masses. Good bowel sounds. Extremities: No cyanosis, clubbing, rash, edema Neuro: Alert & oriented x 3, cranial nerves grossly intact. Moves all 4 extremities w/o difficulty. Affect pleasant.  ASSESSMENT & PLAN: 1. Chronic systolic CHF: Nonischemic cardiomyopathy by coronary angiography in 2010 and 7/19.  He has had a cardiomyopathy for at least the last 10 years.  Echo in 5/19 showed a fall in EF to the 25-30% range.  He does not have an ICD as prior echoes had shown EF in the 40% range.  It is possible that he has a LBBB cardiomyopathy.  Prior viral myocarditis is also a possibility. No significant FH of CHF and no heavy ETOH/substance abuse.  Cardiac MRI in 6/21 showed LV EF 46% with septal-lateral dyssynchrony, RV EF 51%, no LGE.  Echo 8/22 EF 45% with septal-lateral dyssynchrony.  He is not volume overloaded today.  NYHA class II symptoms.   - Restart Farxiga 10 mg daily. BMET today, repeat in 10 days. -  Continue carvedilol 3.125 mg bid. - Continue Entresto 97/103 mg bid. - Add back spiro next. - BMET today and every 3 months.    - EF has been out of CRT-D range.   2. HTN: BP controlled.  3. LBBB: Chronic, unchanged.  4. Obesity: Body mass index is 36.62 kg/m.  - Encouraged weight loss by diet/exercise.  - Unable to tolerate semaglutide. 5. OSA: Continue CPAP.  6. Hyperlipidemia: Continue Crestor 10 mg daily. Check lipids today. - Continue coenzyme Q10 200 mg daily over the counter.   Followup with APP in 6 weeks (consider add back spiro) and with Dr. [name] in 3 months.  [place] FNP 01/18/2021

## 2021-01-29 ENCOUNTER — Telehealth (HOSPITAL_COMMUNITY): Payer: Self-pay | Admitting: *Deleted

## 2021-01-29 ENCOUNTER — Ambulatory Visit (HOSPITAL_COMMUNITY)
Admission: RE | Admit: 2021-01-29 | Discharge: 2021-01-29 | Disposition: A | Discharge status: Home / Self Care | Payer: 59 | Source: Ambulatory Visit | Attending: Cardiology | Admitting: Cardiology

## 2021-01-29 ENCOUNTER — Other Ambulatory Visit: Payer: Self-pay

## 2021-01-29 DIAGNOSIS — I5022 Chronic systolic (congestive) heart failure: Secondary | ICD-10-CM

## 2021-01-29 LAB — BASIC METABOLIC PANEL
Anion gap: 9 (ref 5–15)
BUN: 8 mg/dL (ref 8–23)
CO2: 28 mmol/L (ref 22–32)
Calcium: 7.2 mg/dL — ABNORMAL LOW (ref 8.9–10.3)
Chloride: 100 mmol/L (ref 98–111)
Creatinine, Ser: 1.02 mg/dL (ref 0.61–1.24)
GFR, Estimated: >60 mL/min (ref >60)
Glucose, Bld: 102 mg/dL — ABNORMAL HIGH (ref 70–99)
Potassium: 3 mmol/L — ABNORMAL LOW (ref 3.5–5.1)
Sodium: 137 mmol/L (ref 135–145)

## 2021-01-29 MED ORDER — SPIRONOLACTONE 25 MG PO TABS: 12.5000 mg | ORAL_TABLET | Freq: Every day | ORAL | 3 refills | Status: DC

## 2021-01-29 MED ORDER — POTASSIUM CHLORIDE CRYS ER 20 MEQ PO TBCR: 20.0000 meq | EXTENDED_RELEASE_TABLET | Freq: Every day | ORAL | 0 refills | Status: DC

## 2021-01-29 NOTE — Telephone Encounter (Signed)
-----   Message from Rafael Bihari, [place] sent at 01/29/2021 10:14 AM EDT ----- K remains low. Please restart spironolactone 12.5 mg daily.  Take 20 KCL daily x 3 days only, then stop.   Needs repeat BMET in 10 days. Please call

## 2021-01-29 NOTE — Telephone Encounter (Signed)
Spoke w/pt, he is aware, agreeable, and verbalized understanding, he will restart Arlyce Harman, rx for kcl sent in, repeat labs sch 11/3

## 2021-02-04 ENCOUNTER — Ambulatory Visit: Payer: 59 | Admitting: Cardiology

## 2021-02-07 ENCOUNTER — Other Ambulatory Visit: Payer: Self-pay

## 2021-02-07 ENCOUNTER — Telehealth (HOSPITAL_COMMUNITY): Payer: Self-pay | Admitting: Surgery

## 2021-02-07 ENCOUNTER — Ambulatory Visit (HOSPITAL_COMMUNITY)
Admission: RE | Admit: 2021-02-07 | Discharge: 2021-02-07 | Disposition: A | Discharge status: Home / Self Care | Payer: 59 | Source: Ambulatory Visit | Attending: Cardiology | Admitting: Cardiology

## 2021-02-07 DIAGNOSIS — I5022 Chronic systolic (congestive) heart failure: Secondary | ICD-10-CM | POA: Insufficient documentation

## 2021-02-07 LAB — BASIC METABOLIC PANEL
Anion gap: 10 (ref 5–15)
BUN: 10 mg/dL (ref 8–23)
CO2: 27 mmol/L (ref 22–32)
Calcium: 6.3 mg/dL — CL (ref 8.9–10.3)
Chloride: 102 mmol/L (ref 98–111)
Creatinine, Ser: 1.01 mg/dL (ref 0.61–1.24)
GFR, Estimated: >60 mL/min (ref >60)
Glucose, Bld: 94 mg/dL (ref 70–99)
Potassium: 2.9 mmol/L — ABNORMAL LOW (ref 3.5–5.1)
Sodium: 139 mmol/L (ref 135–145)

## 2021-02-07 MED ORDER — POTASSIUM CHLORIDE CRYS ER 20 MEQ PO TBCR: 20.0000 meq | EXTENDED_RELEASE_TABLET | Freq: Every day | ORAL | 3 refills | Status: DC

## 2021-02-07 MED ORDER — SPIRONOLACTONE 25 MG PO TABS: 25.0000 mg | ORAL_TABLET | Freq: Every day | ORAL | 3 refills | Status: DC

## 2021-02-07 NOTE — Telephone Encounter (Signed)
I contacted patient regarding his results and recommendations per Allena Katz NP.  He will return next Thursday Nov 10th  for repeat labwork.  Medications updated in [facility] and prescriptions sent to pharmacy of choice.

## 2021-02-07 NOTE — Telephone Encounter (Signed)
-----   Message from Rafael Bihari, [place] sent at 02/07/2021 11:24 AM EDT ----- K is still very low. Has he started his spiro? If so, increase to 25 mg daily and add back 20 mEq of KCl daily. Will need repeat BMET next week.  Calcium is very low, needs to be addressed by PCP, will fax results today.

## 2021-02-14 ENCOUNTER — Ambulatory Visit (HOSPITAL_COMMUNITY)
Admission: RE | Admit: 2021-02-14 | Discharge: 2021-02-14 | Disposition: A | Discharge status: Home / Self Care | Payer: 59 | Source: Ambulatory Visit | Attending: Cardiology | Admitting: Cardiology

## 2021-02-14 ENCOUNTER — Other Ambulatory Visit: Payer: Self-pay

## 2021-02-14 ENCOUNTER — Telehealth (HOSPITAL_COMMUNITY): Payer: Self-pay | Admitting: *Deleted

## 2021-02-14 DIAGNOSIS — I5022 Chronic systolic (congestive) heart failure: Secondary | ICD-10-CM | POA: Insufficient documentation

## 2021-02-14 LAB — BASIC METABOLIC PANEL
Anion gap: 9 (ref 5–15)
BUN: 9 mg/dL (ref 8–23)
CO2: 25 mmol/L (ref 22–32)
Calcium: 6.4 mg/dL — CL (ref 8.9–10.3)
Chloride: 105 mmol/L (ref 98–111)
Creatinine, Ser: 1.07 mg/dL (ref 0.61–1.24)
GFR, Estimated: >60 mL/min (ref >60)
Glucose, Bld: 89 mg/dL (ref 70–99)
Potassium: 3.6 mmol/L (ref 3.5–5.1)
Sodium: 139 mmol/L (ref 135–145)

## 2021-02-14 NOTE — Telephone Encounter (Signed)
Main lab called with critical result calcium-6.4. labs forwarded to PCP. Dr.Golding at [facility].

## 2021-02-14 NOTE — Addendum Note (Signed)
Encounter addended by: Kerry Dory, CMA on: 02/14/2021 2:19 PM  Actions taken: Charge Capture section accepted

## 2021-02-20 ENCOUNTER — Other Ambulatory Visit (HOSPITAL_COMMUNITY): Payer: 59

## 2021-02-21 ENCOUNTER — Ambulatory Visit (INDEPENDENT_AMBULATORY_CARE_PROVIDER_SITE_OTHER): Payer: 59 | Admitting: General Surgery

## 2021-02-21 DIAGNOSIS — K6389 Other specified diseases of intestine: Secondary | ICD-10-CM

## 2021-02-21 NOTE — Progress Notes (Signed)
[facility]  Doing well and on regular diet. Feeling good. Regular Bms. Spent < 1 minute on the phone.  Curlene Labrum, MD [facility] [address] [place], [place] [address] [phone] (office)

## 2021-02-26 NOTE — Progress Notes (Signed)
Advanced Heart Failure Clinic Note   PCP: Redmond School, MD PCP-Cardiologist: Minus Breeding, MD  HF Cardiology: Dr. [name]  HPI: Nicholas Gray is a 60 y.o. male with chronic systolic CHF, NICM (Normal coronaries 2010), HTN, LBBB, Obesity, and OSA on CPAP.  He has been known to have a cardiomyopathy for the last 10 years or so.   Seen in [facility] office 08/13/17 and was overall feeling well. No new symptoms.  Echo 08/27/17 LVEF 25-30%, Grade 1 DD, Mild MR, Mild LAE, mild RV dilation, PA peak pressure 34 mm Hg.  Referred by Dr. Percival Spanish to CHF team for further evaluation and treatment of CHF.    Pt admitted 5/30 - 09/13/2017 with Chest/upper abdominal discomfort and pain. CT scan and HIDA consistent with acute cholecystitis. Underwent laproscopic chole on 09/07/17 and found to have gangrenous GB. 09/09/17 repeat HIDA scan showed bile leak. ERCP 09/11/17, but patient had failed stent placement. Considered for IR for drain placement, but decided on conservative management.  He was admitted again with RUQ pain in 7/19, had ERCP with bile leak and choledocholithiasis noted.  He had stone removal and stent placed.   LHC/RHC in 7/19 showed no significant CAD, optimized filling pressures and preserved cardiac output. Cardiac MRI in 11/19 showed improvement in LV EF to 51% with mildly decreased RV systolic function, no LGE.   Echo in 5/21 showed EF in the range of 40% though it is a technically difficult study.  There is septal-lateral dyssynchrony. Cardiac MRI in 6/21 showed LV EF 46% with septal-lateral dyssynchrony, RV EF 51%, no LGE.    Echo 8/22 EF 45% with mild diffuse hypokinesis and septal-lateral dyssynchrony, normal RV.   Admitted 9/23-9/29/22 with abdominal pain and dysphagia, found to have pneumatosis intestinalis of the small intestines. GDMT held due to low BP requiring low-dose NE. Per general surgery no need for surgical intervention and recommended elemental diet. Entresto, carvedilol and lasix  resumed at discharge; spiro and [place] held.  Today he returns for HF follow up. Overall feeling great. Has been deer hunting and has no significant dyspnea with this. Denies tremors/jerking movements, confusion, palpitations, CP, dizziness, edema, or PND/Orthopnea. Appetite ok. No fever or chills. Weight at home 255 pounds. Taking all medications, has been splitting Entresto 97/103 tablets. Wearing CPAP at night.   ECG (personally reviewed): none ordered today.  Labs (6/19): K 4.2, creatinine 1.14 Labs (8/19): K 3.9, creatinine 1.14, normal LFTs Labs (01/05/2018): K 4.5 creatinine 1.23  Labs (11/19): K 3.9, creatinine 1.16 Labs (1/20): K 3.9, creatinine 1.13 Labs (3/21): K 4.2, creatinine 1.28, hgb 13.7, LDL 57, HDL 28 Labs (5/21): K 4.2, creatinine 1.33 Labs (11/21): K 4.1, creatinine 1.43 Labs (1/22): LDL 48 Labs (4/22): K 3.8, creatinine 1.16 Labs (9/22): K 4.2, creatinine 1.03 Labs (10/22): LDL 45, HDL 28 Labs (11/22): K 3.6, creatinine 1.07, Ca 6.3  Social History: Lives in [place], [place]. 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 Denies heavy ETOH use.  Family history: He denies immediate family history of CHF, but thinks his maternal grandparents may have had "heart problems"   Review of systems complete and found to be negative unless listed in HPI.    Past Medical History: 1. Chronic systolic CHF: Suspected nonischemic cardiomyopathy (normal coronaries 2010).  - Echo 2012 with EF 30% - Echo 12/15 with EF 40-45% - Echo 2017 with EF 35-40% - Echo 08/27/17 LVEF 25-30%, Grade 1 DD, Mild MR, Mild LAE, mild  RV dilation, PA peak pressure 34 mm Hg.  - LHC/RHC (7/19): No significant coronary disease. RA 7, PA 39/12 mean 25, PCWP mean 11, CI 3.68, PVR 1.65 WU.  - Cardiac MRI (11/19): LV EF 51% with septal-lateral dyssynchrony, mild RV dilation with RV EF 39%, no LGE.  - Echo (5/21): Technically difficult, EF around 40% with diffuse hypokinesis and  septal-lateral dyssynchrony, normal RV.  - Cardiac MRI (6/21): LV EF 46% with septal-lateral dyssynchrony, RV EF 51%, no LGE.  - Echo (8/22): EF 45% with mild diffuse hypokinesis and septal-lateral dyssynchrony, normal RV.  2. HTN 3. LBBB, chronic - QRS 166 on EKG 09/04/17 4. Obesity Body mass index is 37.48 kg/m. 5. OSA: Reports compliance with his CPAP.  6. GERD 7. Cholecystitis: Underwent laproscopic chole on 09/07/17 and found to have gangrenous GB.  - 09/09/17 repeat HIDA scan showed bile leak.  - ERCP 09/11/17, but patient had failed stent placement. - ERCP 7/19 with sphincterotomy, stone removal, stent placement.   Current Outpatient Medications  Medication Sig Dispense Refill   allopurinol (ZYLOPRIM) 100 MG tablet Take 100 mg by mouth 2 (two) times daily.      carvedilol (COREG) 3.125 MG tablet Take 1 tablet (3.125 mg total) by mouth 2 (two) times daily with a meal. 60 tablet 0   dapagliflozin propanediol (FARXIGA) 10 MG TABS tablet Take 1 tablet (10 mg total) by mouth daily before breakfast. 30 tablet 4   furosemide (LASIX) 20 MG tablet Take 1 tablet (20 mg total) by mouth as needed for fluid or edema. 30 tablet 3   levothyroxine (SYNTHROID) 50 MCG tablet Take 50 mcg by mouth daily.     pantoprazole (PROTONIX) 40 MG tablet Take 1 tablet (40 mg total) by mouth daily. 30 tablet 1   potassium chloride SA (KLOR-CON) 20 MEQ tablet Take 1 tablet (20 mEq total) by mouth daily. 30 tablet 3   rosuvastatin (CRESTOR) 10 MG tablet Take 1 tablet (10 mg total) by mouth daily. 30 tablet 11   sacubitril-valsartan (ENTRESTO) 97-103 MG Take 0.5 tablets by mouth 2 (two) times daily. 180 tablet 3   spironolactone (ALDACTONE) 25 MG tablet Take 1 tablet (25 mg total) by mouth daily. 90 tablet 3   No current facility-administered medications for this encounter.   Allergies  Allergen Reactions   Ozempic (0.25 Or 0.5 Mg-Dose) [Semaglutide(0.25 Or 0.5mg -Dos)]     GI upset on 0.25mg  dose   BP 118/78    Pulse 66   Wt 118.5 kg (261 lb 3.2 oz)   SpO2 96%   BMI 37.48 kg/m   Wt Readings from Last 3 Encounters:  03/04/21 118.5 kg (261 lb 3.2 oz)  01/18/21 115.8 kg (255 lb 3.2 oz)  01/17/21 115.2 kg (254 lb)    PHYSICAL EXAM: General:  NAD. No resp difficulty HEENT: Normal Neck: Supple. No JVD. Carotids 2+ bilat; no bruits. No lymphadenopathy or thryomegaly appreciated. Cor: PMI nondisplaced. Regular rate & rhythm. No rubs, gallops or murmurs. Lungs: Clear Abdomen: Obese, nontender, nondistended. No hepatosplenomegaly. No bruits or masses. Good bowel sounds. Extremities: No cyanosis, clubbing, rash, edema Neuro: Alert & oriented x 3, cranial nerves grossly intact. Moves all 4 extremities w/o difficulty. Affect pleasant.  ASSESSMENT & PLAN: 1. Chronic systolic CHF: Nonischemic cardiomyopathy by coronary angiography in 2010 and 7/19.  He has had a cardiomyopathy for at least the last 10 years.  Echo in 5/19 showed a fall in EF to the 25-30% range.  He does not have an  ICD as prior echoes had shown EF in the 40% range.  It is possible that he has a LBBB cardiomyopathy.  Prior viral myocarditis is also a possibility. No significant FH of CHF and no heavy ETOH/substance abuse.  Cardiac MRI in 6/21 showed LV EF 46% with septal-lateral dyssynchrony, RV EF 51%, no LGE.  Echo 8/22 EF 45% with septal-lateral dyssynchrony.  He is not volume overloaded today.  NYHA class II symptoms.   - Continue Farxiga 10 mg daily.  - Continue carvedilol 3.125 mg bid. - Increase Entresto to 97/103 mg bid. If he becomes symptomatic with increase, OK to decrease to 49/51, but needs Rx for this dose and advised against splitting the 97/103 tablets. BMET today, repeat in 10 days. - Continue spironolactone 25 mg daily.  - EF has been out of CRT-D range.   2. HTN: BP controlled.  3. LBBB: Chronic, unchanged.  4. Obesity: Body mass index is 37.48 kg/m.  - Encouraged weight loss by diet/exercise.  - Unable to tolerate  semaglutide. 5. OSA: Continue CPAP.  6. Hyperlipidemia: Continue Crestor 10 mg daily. Lipids 10/22 ok. - Continue coenzyme Q10 200 mg daily over the counter.  7. Hypocalcemia: ?ast Ca 6.4. Likely needs ionized Ca and PTH drawn for further work up. Previous labs sent to PCP.  - Check mag + VitD today and will forward to PCP. - He needs to make an appt with his PCP soon to work this up.  Followup with with Dr. [name] in 2 months as scheduled.  Martin FNP 03/04/2021

## 2021-03-04 ENCOUNTER — Other Ambulatory Visit (HOSPITAL_COMMUNITY): Payer: Self-pay | Admitting: Surgery

## 2021-03-04 ENCOUNTER — Encounter (HOSPITAL_COMMUNITY): Payer: Self-pay

## 2021-03-04 ENCOUNTER — Telehealth (HOSPITAL_COMMUNITY): Payer: Self-pay | Admitting: Surgery

## 2021-03-04 ENCOUNTER — Ambulatory Visit (HOSPITAL_COMMUNITY)
Admission: RE | Admit: 2021-03-04 | Discharge: 2021-03-04 | Disposition: A | Discharge status: Home / Self Care | Payer: 59 | Source: Ambulatory Visit | Attending: Cardiology | Admitting: Cardiology

## 2021-03-04 VITALS — BP 118/78 | HR 66 | Wt 261.2 lb

## 2021-03-04 DIAGNOSIS — I10 Essential (primary) hypertension: Secondary | ICD-10-CM

## 2021-03-04 DIAGNOSIS — E785 Hyperlipidemia, unspecified: Secondary | ICD-10-CM

## 2021-03-04 DIAGNOSIS — E669 Obesity, unspecified: Secondary | ICD-10-CM | POA: Diagnosis not present

## 2021-03-04 DIAGNOSIS — I428 Other cardiomyopathies: Secondary | ICD-10-CM | POA: Insufficient documentation

## 2021-03-04 DIAGNOSIS — Z6837 Body mass index (BMI) 37.0-37.9, adult: Secondary | ICD-10-CM | POA: Diagnosis not present

## 2021-03-04 DIAGNOSIS — Z7984 Long term (current) use of oral hypoglycemic drugs: Secondary | ICD-10-CM | POA: Diagnosis not present

## 2021-03-04 DIAGNOSIS — I5022 Chronic systolic (congestive) heart failure: Secondary | ICD-10-CM

## 2021-03-04 DIAGNOSIS — I340 Nonrheumatic mitral (valve) insufficiency: Secondary | ICD-10-CM | POA: Diagnosis not present

## 2021-03-04 DIAGNOSIS — Z9989 Dependence on other enabling machines and devices: Secondary | ICD-10-CM | POA: Diagnosis not present

## 2021-03-04 DIAGNOSIS — I110 Hypertensive heart disease with heart failure: Secondary | ICD-10-CM | POA: Insufficient documentation

## 2021-03-04 DIAGNOSIS — I447 Left bundle-branch block, unspecified: Secondary | ICD-10-CM | POA: Diagnosis not present

## 2021-03-04 DIAGNOSIS — Z79899 Other long term (current) drug therapy: Secondary | ICD-10-CM | POA: Diagnosis not present

## 2021-03-04 DIAGNOSIS — Z87891 Personal history of nicotine dependence: Secondary | ICD-10-CM | POA: Insufficient documentation

## 2021-03-04 DIAGNOSIS — G4733 Obstructive sleep apnea (adult) (pediatric): Secondary | ICD-10-CM

## 2021-03-04 LAB — BASIC METABOLIC PANEL
Anion gap: 10 (ref 5–15)
BUN: 10 mg/dL (ref 8–23)
CO2: 26 mmol/L (ref 22–32)
Calcium: 6.8 mg/dL — ABNORMAL LOW (ref 8.9–10.3)
Chloride: 101 mmol/L (ref 98–111)
Creatinine, Ser: 0.94 mg/dL (ref 0.61–1.24)
GFR, Estimated: >60 mL/min (ref >60)
Glucose, Bld: 94 mg/dL (ref 70–99)
Potassium: 3.1 mmol/L — ABNORMAL LOW (ref 3.5–5.1)
Sodium: 137 mmol/L (ref 135–145)

## 2021-03-04 LAB — MAGNESIUM: Magnesium: 1 mg/dL — ABNORMAL LOW (ref 1.7–2.4)

## 2021-03-04 LAB — VITAMIN D 25 HYDROXY (VIT D DEFICIENCY, FRACTURES): Vit D, 25-Hydroxy: 23.81 ng/mL — ABNORMAL LOW (ref 30–100)

## 2021-03-04 MED ORDER — POTASSIUM CHLORIDE CRYS ER 20 MEQ PO TBCR: 20.0000 meq | EXTENDED_RELEASE_TABLET | Freq: Two times a day (BID) | ORAL | 6 refills | Status: DC

## 2021-03-04 MED ORDER — MAGNESIUM OXIDE 400 MG PO CAPS: 400.0000 mg | ORAL_CAPSULE | Freq: Every day | ORAL | 6 refills | Status: DC

## 2021-03-04 MED ORDER — ENTRESTO 97-103 MG PO TABS: 1.0000 | ORAL_TABLET | Freq: Two times a day (BID) | ORAL | 3 refills | Status: DC

## 2021-03-04 NOTE — Progress Notes (Signed)
Orders placed for lab

## 2021-03-04 NOTE — Patient Instructions (Signed)
Thank you for coming in today  Labs were done today, if any labs are abnormal the clinic will call you  INCREASE Entresto 97/103 mg 1 tablets twice daily   STOP Potassium supplement  Your physician recommends that you schedule a follow-up appointment in:   keep follow up with Dr. Scarlette Shorts CALL your primary care to make appointment ASAP for low calcium   At the [facility], you and your health needs are our priority. As part of our continuing mission to provide you with exceptional heart care, we have created designated Provider Care Teams. These Care Teams include your primary Cardiologist (physician) and Advanced Practice Providers (APPs- Physician Assistants and Nurse Practitioners) who all work together to provide you with the care you need, when you need it.   You may see any of the following providers on your designated Care Team at your next follow up: Dr Glori Bickers Dr Haynes Kerns, NP Lyda Jester, [place] [facility] [place], [place] Audry Riles, PharmD   Please be sure to bring in all your medications bottles to every appointment.    If you have any questions or concerns before your next appointment please send [place] a message through [place] or call our office at [phone].    TO LEAVE A MESSAGE FOR THE NURSE SELECT OPTION 2, PLEASE LEAVE A MESSAGE INCLUDING: YOUR NAME DATE OF BIRTH CALL BACK NUMBER REASON FOR CALL**this is important as we prioritize the call backs  YOU WILL RECEIVE A CALL BACK THE SAME DAY AS LONG AS YOU CALL BEFORE 4:00 PM

## 2021-03-04 NOTE — Telephone Encounter (Signed)
-----   Message from Larey Dresser, MD sent at 03/04/2021 12:22 PM EST ----- Low K and Mg.  Add back spironolactone 12.5 daily and add KCl 20 daily.  Also start magnesium oxide 400 mg daily.  BMET and Mg check in 1 week.

## 2021-03-04 NOTE — Telephone Encounter (Signed)
I called and spoke with patient to review results as well as recommendations per Dr. [name].  Patient tells me that he is already taking Spironlactone 25 mg daily.  I have added Potassium 20 meq daily and Magnesium 400 mg daily to his medication list and sent to his pharmacy of choice.  He has an appt for repeat labwork scheduled on Dec 8th.

## 2021-03-14 ENCOUNTER — Ambulatory Visit (HOSPITAL_COMMUNITY)
Admission: RE | Admit: 2021-03-14 | Discharge: 2021-03-14 | Disposition: A | Discharge status: Home / Self Care | Payer: 59 | Source: Ambulatory Visit | Attending: Cardiology | Admitting: Cardiology

## 2021-03-14 ENCOUNTER — Other Ambulatory Visit: Payer: Self-pay

## 2021-03-14 DIAGNOSIS — I5022 Chronic systolic (congestive) heart failure: Secondary | ICD-10-CM | POA: Insufficient documentation

## 2021-03-14 LAB — BASIC METABOLIC PANEL
Anion gap: 9 (ref 5–15)
BUN: 10 mg/dL (ref 8–23)
CO2: 24 mmol/L (ref 22–32)
Calcium: 7.7 mg/dL — ABNORMAL LOW (ref 8.9–10.3)
Chloride: 103 mmol/L (ref 98–111)
Creatinine, Ser: 1.02 mg/dL (ref 0.61–1.24)
GFR, Estimated: >60 mL/min (ref >60)
Glucose, Bld: 103 mg/dL — ABNORMAL HIGH (ref 70–99)
Potassium: 3.9 mmol/L (ref 3.5–5.1)
Sodium: 136 mmol/L (ref 135–145)

## 2021-03-14 LAB — MAGNESIUM: Magnesium: 1.4 mg/dL — ABNORMAL LOW (ref 1.7–2.4)

## 2021-03-20 ENCOUNTER — Telehealth (HOSPITAL_COMMUNITY): Payer: Self-pay | Admitting: *Deleted

## 2021-03-20 MED ORDER — MAGNESIUM OXIDE 400 MG PO CAPS: 400.0000 mg | ORAL_CAPSULE | Freq: Two times a day (BID) | ORAL | 6 refills | Status: AC

## 2021-03-20 NOTE — Addendum Note (Signed)
Addended by: Scarlette Calico on: 03/20/2021 03:34 PM   Modules accepted: Orders

## 2021-03-20 NOTE — Telephone Encounter (Signed)
[place], RN  03/20/2021  3:32 PM EST Back to Top    Spoke w/pt, Pt aware, agreeable, and verbalized understanding, med list updated, labs sch 12/21   Jerl Mina, RN  03/15/2021  3:47 PM EST     No answer.   Kerry Dory, CMA  03/14/2021 12:10 PM EST     Patient called.  Unable to reach patient. No answer unable to leave message :  [phone] Jerilynn Mages)   Larey Dresser, MD  03/14/2021 11:57 AM EST     Increase magnesium oxide to 400 mg bid.  Magnesium level 1 week.

## 2021-03-27 ENCOUNTER — Other Ambulatory Visit: Payer: Self-pay

## 2021-03-27 ENCOUNTER — Ambulatory Visit (HOSPITAL_COMMUNITY)
Admission: RE | Admit: 2021-03-27 | Discharge: 2021-03-27 | Disposition: A | Discharge status: Home / Self Care | Payer: 59 | Source: Ambulatory Visit | Attending: Internal Medicine | Admitting: Internal Medicine

## 2021-03-27 LAB — MAGNESIUM: Magnesium: 1.8 mg/dL (ref 1.7–2.4)

## 2021-04-22 ENCOUNTER — Other Ambulatory Visit: Payer: Self-pay

## 2021-04-22 ENCOUNTER — Ambulatory Visit (HOSPITAL_COMMUNITY)
Admission: RE | Admit: 2021-04-22 | Discharge: 2021-04-22 | Disposition: A | Discharge status: Home / Self Care | Payer: 59 | Source: Ambulatory Visit | Attending: Cardiology | Admitting: Cardiology

## 2021-04-22 ENCOUNTER — Encounter (HOSPITAL_COMMUNITY): Payer: Self-pay | Admitting: Cardiology

## 2021-04-22 VITALS — BP 104/60 | HR 61 | Wt 256.0 lb

## 2021-04-22 DIAGNOSIS — I428 Other cardiomyopathies: Secondary | ICD-10-CM | POA: Diagnosis not present

## 2021-04-22 DIAGNOSIS — Z6836 Body mass index (BMI) 36.0-36.9, adult: Secondary | ICD-10-CM | POA: Diagnosis not present

## 2021-04-22 DIAGNOSIS — K82A1 Gangrene of gallbladder in cholecystitis: Secondary | ICD-10-CM | POA: Diagnosis not present

## 2021-04-22 DIAGNOSIS — E785 Hyperlipidemia, unspecified: Secondary | ICD-10-CM | POA: Insufficient documentation

## 2021-04-22 DIAGNOSIS — Z713 Dietary counseling and surveillance: Secondary | ICD-10-CM | POA: Insufficient documentation

## 2021-04-22 DIAGNOSIS — Z9989 Dependence on other enabling machines and devices: Secondary | ICD-10-CM | POA: Diagnosis not present

## 2021-04-22 DIAGNOSIS — Z7182 Exercise counseling: Secondary | ICD-10-CM | POA: Diagnosis not present

## 2021-04-22 DIAGNOSIS — I5022 Chronic systolic (congestive) heart failure: Secondary | ICD-10-CM | POA: Diagnosis not present

## 2021-04-22 DIAGNOSIS — I110 Hypertensive heart disease with heart failure: Secondary | ICD-10-CM | POA: Insufficient documentation

## 2021-04-22 DIAGNOSIS — Z7984 Long term (current) use of oral hypoglycemic drugs: Secondary | ICD-10-CM | POA: Diagnosis not present

## 2021-04-22 DIAGNOSIS — G4733 Obstructive sleep apnea (adult) (pediatric): Secondary | ICD-10-CM | POA: Diagnosis not present

## 2021-04-22 DIAGNOSIS — E669 Obesity, unspecified: Secondary | ICD-10-CM | POA: Diagnosis not present

## 2021-04-22 DIAGNOSIS — K8042 Calculus of bile duct with acute cholecystitis without obstruction: Secondary | ICD-10-CM | POA: Insufficient documentation

## 2021-04-22 DIAGNOSIS — I447 Left bundle-branch block, unspecified: Secondary | ICD-10-CM | POA: Diagnosis not present

## 2021-04-22 DIAGNOSIS — Z79899 Other long term (current) drug therapy: Secondary | ICD-10-CM | POA: Insufficient documentation

## 2021-04-22 LAB — MAGNESIUM: Magnesium: 2.1 mg/dL (ref 1.7–2.4)

## 2021-04-22 LAB — BASIC METABOLIC PANEL
Anion gap: 12 (ref 5–15)
BUN: 22 mg/dL (ref 8–23)
CO2: 24 mmol/L (ref 22–32)
Calcium: 9.7 mg/dL (ref 8.9–10.3)
Chloride: 97 mmol/L — ABNORMAL LOW (ref 98–111)
Creatinine, Ser: 1.27 mg/dL — ABNORMAL HIGH (ref 0.61–1.24)
GFR, Estimated: >60 mL/min (ref >60)
Glucose, Bld: 104 mg/dL — ABNORMAL HIGH (ref 70–99)
Potassium: 4.5 mmol/L (ref 3.5–5.1)
Sodium: 133 mmol/L — ABNORMAL LOW (ref 135–145)

## 2021-04-22 MED ORDER — CARVEDILOL 6.25 MG PO TABS: 6.2500 mg | ORAL_TABLET | Freq: Two times a day (BID) | ORAL | 3 refills | Status: DC

## 2021-04-22 NOTE — Progress Notes (Signed)
Advanced Heart Failure Clinic Note   PCP: Redmond School, MD PCP-Cardiologist: Minus Breeding, MD  HF Cardiology: Dr. [name]  HPI:  Nicholas Gray is a 61 y.o. male with chronic systolic CHF, NICM (Normal coronaries 2010), HTN, LBBB, Obesity, and OSA on CPAP.  He has been known to have a cardiomyopathy for the last 10 years or so.   Seen in [facility] office 08/13/17 and was overall feeling well. No new symptoms.  Echo 08/27/17 LVEF 25-30%, Grade 1 DD, Mild MR, Mild LAE, mild RV dilation, PA peak pressure 34 mm Hg.  Referred by Dr. Percival Spanish to CHF team for further evaluation and treatment of CHF.    Pt admitted 5/30 - 09/13/2017 with Chest/upper abdominal discomfort and pain. CT scan and HIDA consistent with acute cholecystitis. Underwent laproscopic chole on 09/07/17 and found to have gangrenous GB. 09/09/17 repeat HIDA scan showed bile leak. ERCP 09/11/17, but patient had failed stent placement. Considered for IR for drain placement, but decided on conservative management.  He was admitted again with RUQ pain in 7/19, had ERCP with bile leak and choledocholithiasis noted.  He had stone removal and stent placed.   LHC/RHC in 7/19 showed no significant CAD, optimized filling pressures and preserved cardiac output. Cardiac MRI in 11/19 showed improvement in LV EF to 51% with mildly decreased RV systolic function, no LGE.   Echo in 5/21 showed EF in the range of 40% though it is a technically difficult study.  There is septal-lateral dyssynchrony. Cardiac MRI in 6/21 showed LV EF 46% with septal-lateral dyssynchrony, RV EF 51%, no LGE.  Echo in 8/22 showed EF 45% with mild diffuse hypokinesis and septal-lateral dyssynchrony, normal RV.   Patient started Ozempic for weight loss.  He lost weight but developed profuse diarrhea and actually ended up in the hospital with pneumatosis intestinalis.  He is now off Ozempic.  HF meds were cut back during this admission, meds have been increased back up but still only on  low dose Coreg.   He returns today for followup of CHF.  No dyspnea walking on flat ground.  He is short of breath walking up a steep hill.  No chest pain.  Has been rabbit hunting this weekend.  No orthopnea/PND. No lightheadedness though BP is mildly low.   ECG (personally reviewed): NSR, LBBB 160 msec  Labs (6/19): K 4.2, creatinine 1.14 Labs (8/19): K 3.9, creatinine 1.14, normal LFTs Labs (01/05/2018): K 4.5 creatinine 1.23  Labs (11/19): K 3.9, creatinine 1.16 Labs (1/20): K 3.9, creatinine 1.13 Labs (3/21): K 4.2, creatinine 1.28, hgb 13.7, LDL 57, HDL 28 Labs (5/21): K 4.2, creatinine 1.33 Labs (11/21): K 4.1, creatinine 1.43 Labs (1/22): LDL 48 Labs (4/22): K 3.8, creatinine 1.16 Labs (10/22): LDL 45 Labs (12/22): Mg 1.8, K 3.9, creatinine 1.02  Social History: Lives in [place], [place]. 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 Denies heavy ETOH use.  Family history: He denies immediate family history of CHF, but thinks his maternal grandparents may have had "heart problems"   Review of systems complete and found to be negative unless listed in HPI.    Past Medical History: 1. Chronic systolic CHF: Suspected nonischemic cardiomyopathy (normal coronaries 2010).  - Echo 2012 with EF 30% - Echo 12/15 with EF 40-45% - Echo 2017 with EF 35-40% - Echo 08/27/17 LVEF 25-30%, Grade 1 DD, Mild MR, Mild LAE, mild RV dilation, PA peak pressure 34 mm Hg.  -  LHC/RHC (7/19): No significant coronary disease. RA 7, PA 39/12 mean 25, PCWP mean 11, CI 3.68, PVR 1.65 WU.  - Cardiac MRI (11/19): LV EF 51% with septal-lateral dyssynchrony, mild RV dilation with RV EF 39%, no LGE.  - Echo (5/21): Technically difficult, EF around 40% with diffuse hypokinesis and septal-lateral dyssynchrony, normal RV.  - Cardiac MRI (6/21): LV EF 46% with septal-lateral dyssynchrony, RV EF 51%, no LGE.  - Echo (8/22): EF 45% with mild diffuse hypokinesis and septal-lateral  dyssynchrony, normal RV.  2. HTN 3. LBBB, chronic - QRS 166 on EKG 09/04/17 4. Obesity Body mass index is 36.73 kg/m. 5. OSA: Reports compliance with his CPAP.  6. GERD 7. Cholecystitis: Underwent laproscopic chole on 09/07/17 and found to have gangrenous GB.  - 09/09/17 repeat HIDA scan showed bile leak.  - ERCP 09/11/17, but patient had failed stent placement. - ERCP 7/19 with sphincterotomy, stone removal, stent placement.   Current Outpatient Medications  Medication Sig Dispense Refill   allopurinol (ZYLOPRIM) 100 MG tablet Take 100 mg by mouth 2 (two) times daily.      Calcium Carb-Cholecalciferol (CALCIUM 600 + D PO) Take 1 tablet by mouth daily.     Cholecalciferol (VITAMIN D3) 125 MCG (5000 UT) CAPS Take 1 capsule by mouth daily.     dapagliflozin propanediol (FARXIGA) 10 MG TABS tablet Take 1 tablet (10 mg total) by mouth daily before breakfast. 30 tablet 4   furosemide (LASIX) 20 MG tablet Take 1 tablet (20 mg total) by mouth as needed for fluid or edema. 30 tablet 3   levothyroxine (SYNTHROID) 50 MCG tablet Take 50 mcg by mouth daily.     Magnesium Oxide 400 MG CAPS Take 1 capsule (400 mg total) by mouth in the morning and at bedtime. 30 capsule 6   pantoprazole (PROTONIX) 40 MG tablet Take 1 tablet (40 mg total) by mouth daily. 30 tablet 1   potassium chloride SA (KLOR-CON) 20 MEQ tablet Take 1 tablet (20 mEq total) by mouth 2 (two) times daily. 30 tablet 6   rosuvastatin (CRESTOR) 10 MG tablet Take 1 tablet (10 mg total) by mouth daily. 30 tablet 11   sacubitril-valsartan (ENTRESTO) 97-103 MG Take 1 tablet by mouth 2 (two) times daily. 180 tablet 3   spironolactone (ALDACTONE) 25 MG tablet Take 1 tablet (25 mg total) by mouth daily. 90 tablet 3   carvedilol (COREG) 6.25 MG tablet Take 1 tablet (6.25 mg total) by mouth 2 (two) times daily with a meal. 60 tablet 3   No current facility-administered medications for this encounter.   Allergies  Allergen Reactions   Ozempic (0.25  Or 0.5 Mg-Dose) [Semaglutide(0.25 Or 0.5mg -Dos)]     GI upset on 0.25mg  dose    Vitals:   04/22/21 0921  BP: 104/60  Pulse: 61  SpO2: 97%  Weight: 116.1 kg (256 lb)   Wt Readings from Last 3 Encounters:  04/22/21 116.1 kg (256 lb)  03/04/21 118.5 kg (261 lb 3.2 oz)  01/18/21 115.8 kg (255 lb 3.2 oz)    PHYSICAL EXAM: General: NAD Neck: No JVD, no thyromegaly or thyroid nodule.  Lungs: Clear to auscultation bilaterally with normal respiratory effort. CV: Nondisplaced PMI.  Heart regular S1/S2, no S3/S4, no murmur.  No peripheral edema.  No carotid bruit.  Normal pedal pulses.  Abdomen: Soft, nontender, no hepatosplenomegaly, no distention.  Skin: Intact without lesions or rashes.  Neurologic: Alert and oriented x 3.  Psych: Normal affect. Extremities: No clubbing or  cyanosis.  HEENT: Normal.   ASSESSMENT & PLAN:  1. Chronic systolic CHF: Nonischemic cardiomyopathy by coronary angiography in 2010 and 7/19.  He has had a cardiomyopathy for at least the last 10 years.  Echo in 5/19 showed a fall in EF to the 25-30% range.  He does not have an ICD as prior echoes had shown EF in the 40% range.  It is possible that he has a LBBB cardiomyopathy.  Prior viral myocarditis is also a possibility. No significant FH of CHF and no heavy ETOH/substance abuse.  Cardiac MRI in 6/21 showed LV EF 46% with septal-lateral dyssynchrony, RV EF 51%, no LGE.  Echo in 8/22 showed EF 45% with septal-lateral dyssynchrony.  He is not volume overloaded.  NYHA class I-II symptoms.   - He does not appear to need standing Lasix.  - Continue current Entresto, spironolactone, and dapagliflozin (all at goal).  - Increase Coreg to 6.25 mg bid and will have him followup with HF pharmacist to work on increasing further.   - BMET today.     - EF has been out of CRT-D range but will repeat echo at followup in 6 months.    2. HTN: BP controlled.  3. LBBB: Chronic, unchanged.  4. Obesity: Body mass index is 36.73 kg/m.   - Encouraged weight loss by diet/exercise.  - He did not tolerate semaglutide witih profuse diarrhea.  5. OSA: Continue CPAP.  6. Hyperlipidemia: ?Myalgias related to simvastatin.  - Continue Crestor, good lipids in 10/22.   Followup in 6 months with echo.  See HF pharmacist in 3 wks for titration of Coreg.    [name] 04/22/2021

## 2021-04-22 NOTE — Patient Instructions (Signed)
INCREASE Coreg to 6.25mg  (1 tab) twice a day  Labs today We will only contact you if something comes back abnormal or we need to make some changes. Otherwise no news is good news!  Your physician has requested that you have an echocardiogram. Echocardiography is a painless test that uses sound waves to create images of your heart. It provides your doctor with information about the size and shape of your heart and how well your hearts chambers and valves are working. This procedure takes approximately one hour. There are no restrictions for this procedure.  Your physician recommends that you schedule a follow-up appointment in: 3 weeks with the pharmacist and 6 months with Dr [name] and an ECHO.  Please call office at [phone] option 2 if you have any questions or concerns.   Do the following things EVERYDAY: Weigh yourself in the morning before breakfast. Write it down and keep it in a log. Take your medicines as prescribed Eat low salt foods--Limit salt (sodium) to 2000 mg per day.  Stay as active as you can everyday Limit all fluids for the day to less than 2 liters  At the [facility], you and your health needs are our priority. As part of our continuing mission to provide you with exceptional heart care, we have created designated Provider Care Teams. These Care Teams include your primary Cardiologist (physician) and Advanced Practice Providers (APPs- Physician Assistants and Nurse Practitioners) who all work together to provide you with the care you need, when you need it.   You may see any of the following providers on your designated Care Team at your next follow up: Dr Glori Bickers Dr Haynes Kerns, NP Lyda Jester, [place] [facility] [place], [place] Audry Riles, PharmD   Please be sure to bring in all your medications bottles to every appointment.

## 2021-05-03 NOTE — Progress Notes (Signed)
Advanced Heart Failure Clinic Note   PCP: Redmond School, MD PCP-Cardiologist: Minus Breeding, MD  HF Cardiology: Dr. [name]  HPI:  Nicholas Gray is a 61 y.o. male with chronic systolic CHF, NICM (Normal coronaries 2010), HTN, LBBB, Obesity, and OSA on CPAP.  He has been known to have a cardiomyopathy for the last 10 years or so.    Seen in [facility] office 08/13/17 and was overall feeling well. No new symptoms.  Echo 08/27/17 LVEF 25-30%, Grade 1 DD, Mild MR, Mild LAE, mild RV dilation, PA peak pressure 34 mm Hg.  Referred by Dr. Percival Spanish to CHF team for further evaluation and treatment of CHF.     Pt admitted 5/30 - 09/13/2017 with Chest/upper abdominal discomfort and pain. CT scan and HIDA consistent with acute cholecystitis. Underwent laproscopic chole on 09/07/17 and found to have gangrenous GB. 09/09/17 repeat HIDA scan showed bile leak. ERCP 09/11/17, but patient had failed stent placement. Considered for IR for drain placement, but decided on conservative management.  He was admitted again with RUQ pain in 10/2017, had ERCP with bile leak and choledocholithiasis noted.  He had stone removal and stent placed.    LHC/RHC in 10/2017 showed no significant CAD, optimized filling pressures and preserved cardiac output. Cardiac MRI in 02/2018 showed improvement in LV EF to 51% with mildly decreased RV systolic function, no LGE.    Echo in 08/2019 showed EF in the range of 40% though it was a technically difficult study.  There was septal-lateral dyssynchrony. Cardiac MRI in 09/2019 showed LV EF 46% with septal-lateral dyssynchrony, RV EF 51%, no LGE.  Echo in 11/2020 showed EF 45% with mild diffuse hypokinesis and septal-lateral dyssynchrony, normal RV.    Patient started Ozempic for weight loss.  He lost weight but developed profuse diarrhea and actually ended up in the hospital with pneumatosis intestinalis.  He is now off Ozempic.  HF meds were cut back during that admission, medications have been  increased back up but still only on low dose carvedilol.    Presented to AHF for followup of CHF 04/22/21.  No dyspnea walking on flat ground.  He reported getting short of breath walking up a steep hill.  No chest pain.  Had been rabbit hunting the previous weekend.  No orthopnea/PND. No lightheadedness though BP was mildly low.   Today he returns to HF clinic for pharmacist medication titration. At last visit with MD carvedilol was increased to 6.25 mg BID. Overall he is feeling well today. No dizziness or lightheadedness. No syncope/presyncope. No CP or palpitations. Gets SOB when going up hills or inclines, but does ok walking on flat ground. Weight up 4 lbs from last clinic visit. He does not feel like he is gaining any fluid, believes weight gain is related to diet. No LEE, PND or orthopnea. Taking all medications as prescribed and tolerating all medications.    HF Medications: Carvedilol 6.25 mg BID Entresto 97/103 mg BID Spironolactone 25 mg daily Farxiga 10 mg daily Lasix 20 mg PRN Potassium chloride 20 mEq BID  Has the patient been experiencing any side effects to the medications prescribed?  no  Does the patient have any problems obtaining medications due to transportation or finances?   No; has [employer] commercial insurance.   Understanding of regimen: good Understanding of indications: good Potential of compliance: good Patient understands to avoid NSAIDs. Patient understands to avoid decongestants.    Pertinent Lab Values: 04/22/21: Serum creatinine 1.27, BUN  22, Potassium 4.5, Sodium 133  Vital Signs: Weight: 260.4 lbs (last clinic weight: 256 lbs) Blood pressure: 110/66  Heart rate: 54   Assessment/Plan: 1. Chronic systolic CHF: Nonischemic cardiomyopathy by coronary angiography in 2010 and 10/2017.  He has had a cardiomyopathy for at least the last 10 years.  Echo in 08/2017 showed a fall in EF to the 25-30% range.  He does not have an ICD as prior echoes  had shown EF in the 40% range.  It is possible that he has a LBBB cardiomyopathy.  Prior viral myocarditis is also a possibility. No significant FH of CHF and no heavy ETOH/substance abuse.  Cardiac MRI in 09/2019 showed LV EF 46% with septal-lateral dyssynchrony, RV EF 51%, no LGE.  Echo in 11/2020 showed EF 45% with septal-lateral dyssynchrony.   - NYHA class II symptoms. Euvolemic on exam.  - He does not appear to need standing Lasix.   - Continue carvedilol 6.25 mg BID. Unable to increase further with HR in the 50s.  - Continue Entresto 97/103 mg BID - Continue spironolactone 25 mg daily - Continue Farxiga 10 mg daily - EF has been out of CRT-D range but will repeat echo at followup in 6 months.    2. HTN: BP controlled.  3. LBBB: Chronic, unchanged.  4. Obesity: Body mass index is 36.73 kg/m.  - Encouraged weight loss by diet/exercise.  - He did not tolerate semaglutide witih profuse diarrhea.  5. OSA: Continue CPAP.  6. Hyperlipidemia: ?Myalgias related to simvastatin.  - Continue Crestor, good lipids in 01/2021.   Follow up in 5 months with Dr. [name].   Audry Riles, PharmD, BCPS, BCCP, CPP Heart Failure Clinic Pharmacist [phone]

## 2021-05-16 ENCOUNTER — Other Ambulatory Visit: Payer: Self-pay

## 2021-05-16 ENCOUNTER — Ambulatory Visit (HOSPITAL_COMMUNITY)
Admission: RE | Admit: 2021-05-16 | Discharge: 2021-05-16 | Disposition: A | Discharge status: Home / Self Care | Payer: 59 | Source: Ambulatory Visit | Attending: Cardiology | Admitting: Cardiology

## 2021-05-16 VITALS — BP 110/66 | HR 54 | Wt 260.4 lb

## 2021-05-16 DIAGNOSIS — I428 Other cardiomyopathies: Secondary | ICD-10-CM | POA: Diagnosis not present

## 2021-05-16 DIAGNOSIS — Z713 Dietary counseling and surveillance: Secondary | ICD-10-CM | POA: Insufficient documentation

## 2021-05-16 DIAGNOSIS — Z79899 Other long term (current) drug therapy: Secondary | ICD-10-CM | POA: Insufficient documentation

## 2021-05-16 DIAGNOSIS — Z6836 Body mass index (BMI) 36.0-36.9, adult: Secondary | ICD-10-CM | POA: Insufficient documentation

## 2021-05-16 DIAGNOSIS — E785 Hyperlipidemia, unspecified: Secondary | ICD-10-CM | POA: Diagnosis not present

## 2021-05-16 DIAGNOSIS — G4733 Obstructive sleep apnea (adult) (pediatric): Secondary | ICD-10-CM | POA: Diagnosis not present

## 2021-05-16 DIAGNOSIS — E669 Obesity, unspecified: Secondary | ICD-10-CM | POA: Diagnosis not present

## 2021-05-16 DIAGNOSIS — Z9989 Dependence on other enabling machines and devices: Secondary | ICD-10-CM | POA: Insufficient documentation

## 2021-05-16 DIAGNOSIS — Z7182 Exercise counseling: Secondary | ICD-10-CM | POA: Diagnosis not present

## 2021-05-16 DIAGNOSIS — I5022 Chronic systolic (congestive) heart failure: Secondary | ICD-10-CM

## 2021-05-16 DIAGNOSIS — Z7984 Long term (current) use of oral hypoglycemic drugs: Secondary | ICD-10-CM | POA: Diagnosis not present

## 2021-05-16 DIAGNOSIS — I110 Hypertensive heart disease with heart failure: Secondary | ICD-10-CM | POA: Insufficient documentation

## 2021-05-16 DIAGNOSIS — I447 Left bundle-branch block, unspecified: Secondary | ICD-10-CM | POA: Diagnosis not present

## 2021-05-16 DIAGNOSIS — Z8719 Personal history of other diseases of the digestive system: Secondary | ICD-10-CM | POA: Insufficient documentation

## 2021-05-16 NOTE — Patient Instructions (Addendum)
It was a pleasure seeing you today!  MEDICATIONS: -No medication changes today -Call if you have questions about your medications.  NEXT APPOINTMENT: Return to clinic in July with Dr. [name]. Call clinic to schedule in June.   In general, to take care of your heart failure: -Limit your fluid intake to 2 Liters (half-gallon) per day.   -Limit your salt intake to ideally 2-3 grams (2000-3000 mg) per day. -Weigh yourself daily and record, and bring that "weight diary" to your next appointment.  (Weight gain of 2-3 pounds in 1 day typically means fluid weight.) -The medications for your heart are to help your heart and help you live longer.   -Please contact us before stopping any of your heart medications.  Call the clinic at [phone] with questions or to reschedule future appointments.

## 2021-05-21 ENCOUNTER — Other Ambulatory Visit (HOSPITAL_COMMUNITY): Payer: Self-pay | Admitting: Cardiology

## 2021-06-26 ENCOUNTER — Telehealth: Payer: Self-pay | Admitting: Gastroenterology

## 2021-06-26 NOTE — Telephone Encounter (Signed)
Returned call to patient. He reports that he has passed BRB twice in a week. He reports that he has loose stools also. Pt has noticed BRB intermittently today, not with every BM. Pt wanted to know if he needed to go to the ED. I informed pt that he will need to go to ER if he develops any SOB, dizziness, fatigue, or if he is passing any maroon colored stools. Pt is aware that it has been several years since he has been seen and will need an office visit for additional recommendations. I told him that it is possible that he may have hemorrhoids but the provider will assess at the time of his appt and may want to order labs to check his blood counts. Pt has been scheduled for an appt to see Vicie Mutters, PA-C on Thursday, 06/27/21 at 1:30 pm . Pt knows to check in on the 3rd floor. Pt verbalized understanding and had no concerns at the end of the call.  ?

## 2021-06-26 NOTE — Telephone Encounter (Signed)
Inbound call from patient stating that in the last week he has passed blood in his stool and is seeking advice if he needs to go to the ER. Please advise.  ?

## 2021-06-27 ENCOUNTER — Encounter: Payer: Self-pay | Admitting: Physician Assistant

## 2021-06-27 ENCOUNTER — Ambulatory Visit (INDEPENDENT_AMBULATORY_CARE_PROVIDER_SITE_OTHER): Payer: 59 | Admitting: Physician Assistant

## 2021-06-27 ENCOUNTER — Other Ambulatory Visit (INDEPENDENT_AMBULATORY_CARE_PROVIDER_SITE_OTHER): Payer: 59

## 2021-06-27 VITALS — BP 120/76 | HR 61 | Ht 70.0 in | Wt 255.6 lb

## 2021-06-27 DIAGNOSIS — K625 Hemorrhage of anus and rectum: Secondary | ICD-10-CM | POA: Diagnosis not present

## 2021-06-27 DIAGNOSIS — R197 Diarrhea, unspecified: Secondary | ICD-10-CM

## 2021-06-27 DIAGNOSIS — J8489 Other specified interstitial pulmonary diseases: Secondary | ICD-10-CM

## 2021-06-27 LAB — CBC WITH DIFFERENTIAL/PLATELET
Basophils Absolute: 0.1 10*3/uL (ref 0.0–0.1)
Basophils Relative: 0.8 % (ref 0.0–3.0)
Eosinophils Absolute: 0.1 10*3/uL (ref 0.0–0.7)
Eosinophils Relative: 1.4 % (ref 0.0–5.0)
HCT: 38.3 % — ABNORMAL LOW (ref 39.0–52.0)
Hemoglobin: 12.9 g/dL — ABNORMAL LOW (ref 13.0–17.0)
Lymphocytes Relative: 21.6 % (ref 12.0–46.0)
Lymphs Abs: 1.5 10*3/uL (ref 0.7–4.0)
MCHC: 33.8 g/dL (ref 30.0–36.0)
MCV: 93.1 fl (ref 78.0–100.0)
Monocytes Absolute: 0.6 10*3/uL (ref 0.1–1.0)
Monocytes Relative: 8.8 % (ref 3.0–12.0)
Neutro Abs: 4.8 10*3/uL (ref 1.4–7.7)
Neutrophils Relative %: 67.4 % (ref 43.0–77.0)
Platelets: 219 10*3/uL (ref 150.0–400.0)
RBC: 4.12 Mil/uL — ABNORMAL LOW (ref 4.22–5.81)
RDW: 15.3 % (ref 11.5–15.5)
WBC: 7.2 10*3/uL (ref 4.0–10.5)

## 2021-06-27 LAB — COMPREHENSIVE METABOLIC PANEL
ALT: 14 U/L (ref 0–53)
AST: 16 U/L (ref 0–37)
Albumin: 4.7 g/dL (ref 3.5–5.2)
Alkaline Phosphatase: 49 U/L (ref 39–117)
BUN: 23 mg/dL (ref 6–23)
CO2: 27 mEq/L (ref 19–32)
Calcium: 9.9 mg/dL (ref 8.4–10.5)
Chloride: 99 mEq/L (ref 96–112)
Creatinine, Ser: 1.5 mg/dL (ref 0.40–1.50)
GFR: 49.73 mL/min — ABNORMAL LOW (ref >60.00)
Glucose, Bld: 88 mg/dL (ref 70–99)
Potassium: 4.5 mEq/L (ref 3.5–5.1)
Sodium: 134 mEq/L — ABNORMAL LOW (ref 135–145)
Total Bilirubin: 1 mg/dL (ref 0.2–1.2)
Total Protein: 7.6 g/dL (ref 6.0–8.3)

## 2021-06-27 LAB — TSH: TSH: 1.02 u[IU]/mL (ref 0.35–5.50)

## 2021-06-27 MED ORDER — HYDROCORTISONE (PERIANAL) 2.5 % EX CREA: 1.0000 "application " | TOPICAL_CREAM | Freq: Two times a day (BID) | CUTANEOUS | 1 refills | Status: AC | PRN

## 2021-06-27 NOTE — Patient Instructions (Addendum)
If you are age 62 or older, your body mass index should be between 23-30. Your Body mass index is 36.67 kg/m?Nicholas Gray If this is out of the aforementioned range listed, please consider follow up with your Primary Care Provider. ? ?If you are age 90 or younger, your body mass index should be between 19-25. Your Body mass index is 36.67 kg/m?Nicholas Gray If this is out of the aformentioned range listed, please consider follow up with your Primary Care Provider.  ? ?________________________________________________________ ? ?The  GI providers would like to encourage you to use [facility] to communicate with providers for non-urgent requests or questions.  Due to long hold times on the telephone, sending your provider a message by [facility] may be a faster and more efficient way to get a response.  Please allow 48 business hours for a response.  Please remember that this is for non-urgent requests.  ?_______________________________________________________ ? ?Your provider has requested that you go to the basement level for lab work before leaving today. Press "B" on the elevator. The lab is located at the first door on the left as you exit the elevator. ? ? ?Anal Fissure, Adult ? ?Apply a pea size amount of over the counter Anusol HC cream to the tip of an over the counter PrepH suppository and insert rectally once every night for at least 7 nights. Also apply this externally.  ? ?About Hemorrhoids ? ?Hemorrhoids are swollen veins in the lower rectum and anus.  Also called piles, hemorrhoids are a common problem.  Hemorrhoids may be internal (inside the rectum) or external (around the anus). ? ?Internal Hemorrhoids ? ?Internal hemorrhoids are often painless, but they rarely cause bleeding.  The internal veins may stretch and fall down (prolapse) through the anus to the outside of the body.  The veins may then become irritated and painful. ? ?External Hemorrhoids ? ?External hemorrhoids can be easily seen or felt around the anal  opening.  They are under the skin around the anus.  When the swollen veins are scratched or broken by straining, rubbing or wiping they sometimes bleed. ? ?How Hemorrhoids Occur ? ?Veins in the rectum and around the anus tend to swell under pressure.  Hemorrhoids can result from increased pressure in the veins of your anus or rectum.  Some sources of pressure are: ? ?Straining to have a bowel movement because of constipation ?Waiting too long to have a bowel movement ?Coughing and sneezing often ?Sitting for extended periods of time, including on the toilet ?Diarrhea ?Obesity ?Trauma or injury to the anus ?Some liver diseases ?Stress ?Family history of hemorrhoids ?Pregnancy ? Pregnant women should try to avoid becoming constipated, because they are more likely to have hemorrhoids during pregnancy.  In the last trimester of pregnancy, the enlarged uterus may press on blood vessels and causes hemorrhoids.  In addition, the strain of childbirth sometimes causes hemorrhoids after the birth. ? ?Symptoms of Hemorrhoids ? ?Some symptoms of hemorrhoids include: ?Swelling and/or a tender lump around the anus ?Itching, mild burning and bleeding around the anus ?Painful bowel movements with or without constipation ?Bright red blood covering the stool, on toilet paper or in the toilet bowel.  ? ?Symptoms usually go away within a few days.  Always talk to your doctor about any bleeding to make sure it is not from some other causes. ? ?Diagnosing and Treating Hemorrhoids ? ?Diagnosis is made by an examination by your healthcare provider.  Special test can be performed by your doctor.   ? ?  Most cases of hemorrhoids can be treated with: ?High-fiber diet: Eat more high-fiber foods, which help prevent constipation.  Ask for more detailed fiber information on types and sources of fiber from your healthcare provider. ?Fluids: Drink plenty of water.  This helps soften bowel movements so they are easier to pass. ?Sitz baths and cold  packs: Sitting in lukewarm water two or three times a day for 15 minutes cleases the anal area and may relieve discomfort.  If the water is too hot, swelling around the anus will get worse.  Placing a cloth-covered ice pack on the anus for ten minutes four times a day can also help reduce selling.  Gently pushing a prolapsed hemorrhoid back inside after the bath or ice pack can be helpful. ?Medications: For mild discomfort, your healthcare provider may suggest over-the-counter pain medication or prescribe a cream or ointment for topical use.  The cream may contain witch hazel, zinc oxide or petroleum jelly.  Medicated suppositories are also a treatment option.  Always consult your doctor before applying medications or creams. ?Procedures and surgeries: There are also a number of procedures and surgeries to shrink or remove hemorrhoids in more serious cases.  Talk to your physician about these options. ? ?You can often prevent hemorrhoids or keep them from becoming worse by maintaining a healthy lifestyle.  Eat a fiber-rich diet of fruits, vegetables and whole grains.  Also, drink plenty of water and exercise regularly. ? ?? 2007, [employer] Doc.30 ? ?Follow up should symptoms persist for secondary evaluation and possible surgical referral for repair. ?An anal fissure is a small tear or crack in the skin around the anus. Bleeding from a fissure usually stops on its own within a few minutes. However, bleeding will often occur again with each bowel movement until the crack heals. ?CAUSES ?This condition may be caused by: ?Passing large, hard stool (feces). ?Frequent diarrhea. ?Constipation. ?Inflammatory bowel disease (Crohn disease or ulcerative colitis). ?Infections. ?Anal sex. ?SYMPTOMS ?Symptoms of this condition include: ?Bleeding from the rectum. ?Small amounts of blood seen on your stool, on toilet paper, or in the toilet after a bowel movement. ?Painful bowel movements. ?Itching or irritation  around the anus. ?DIAGNOSIS  ?A health care provider may diagnose this condition by closely examining the anal area. An anal fissure can usually be seen with careful inspection. In some cases, a rectal exam may be performed, or a short tube (anoscope) may be used to examine the anal canal. ?TREATMENT ?Treatment for this condition may include: ?Taking steps to avoid constipation. This may include making changes to your diet, such as increasing your intake of fiber or fluid. ?Taking fiber supplements. These supplements can soften your stool to help make bowel movements easier. Your health care provider may also prescribe a stool softener if your stool is often hard. ?Taking sitz baths. This may help to heal the tear. ?Using medicated creams or ointments. These may be prescribed to lessen discomfort. ?HOME CARE INSTRUCTIONS ?Eating and Drinking ?Avoid foods that may be constipating, such as bananas and dairy products. ?Drink enough fluid to keep your urine clear or pale yellow. ?Maintain a diet that is high in fruits, whole grains, and vegetables. ?General Instructions ?Keep the anal area as clean and dry as possible. ?Take sitz baths as told by your health care provider. Do not use soap in the sitz baths. ?Take over-the-counter and prescription medicines only as told by your health care provider. ?Use creams or ointments only as told by your health  care provider. ?Keep all follow-up visits as told by your health care provider. This is important. ?SEEK MEDICAL CARE IF: ?You have more bleeding. ?You have a fever. ?You have diarrhea that is mixed with blood. ?You continue to have pain. ?Your problem is getting worse rather than better. ?  ?This information is not intended to replace advice given to you by your health care provider. Make sure you discuss any questions you have with your health care provider. ?  ?Document Released: 03/24/2005 Document Revised: 12/13/2014 Document Reviewed: 06/19/2014 ?Elsevier Interactive  Patient Education ?2016 [employer]. ? ?

## 2021-06-27 NOTE — Progress Notes (Signed)
? ? ? ?06/27/2021 ?Nicholas Gray ?[id number] ?1961/02/17 ? ? ?ASSESSMENT AND PLAN:  ? ?Diarrhea, unspecified type ?-     Pancreatic elastase, fecal; Future ?-     Clostridium difficile Toxin B, Qualitative, Real-Time PCR; Future ?-     CBC with Differential/Platelet; Future ?-     Comprehensive metabolic panel; Future ?-     TSH; Future ?-     IgA; Future ?-     Tissue transglutaminase, IgA; Future ?Diarrhea since choley in 2019. Occ constipation.  ?Likely bile acid diarrhea, will check labs to rule out Cdiff and EPI.  ?Can do trial of bile acid sequestrant if all negative.  ?Can consider repeat colonoscopy sooner if not improving with conservative measures.  ?03/08/2018 colonoscopy personal history of adenomatous polyps excellent bowel prep diverticula, 2 mm sessile serrated polyp transverse colon- recall 5 years. (2024) ? ?Rectal bleeding ?-     CBC with Differential/Platelet; Future ?Had previous normal CBC, will recheck today.  ?Likely posterior healing fissure, appreciated internal hemorrhoids ?-Sitz baths, increase fiber, increase water, need to fix toilet habits.  ?-Hydrocortisone supp give and external cream sent in.  ?-We discussed hemorrhoid banding here in the office or surgical treatment if hemorrhoids do not improve with conservative treatment. ?- follow up for evaluation ?-     hydrocortisone (ANUSOL-HC) 2.5 % rectal cream; Place 1 application. rectally 2 (two) times daily as needed for hemorrhoids. ? ?Pneumonitis, interstitial ([place]) ?Had hospitalization 12/2020, unknown cause.  ?No symptoms at this time, repeat CT 01/2021 resolved.  ?Will discuss with Dr. Loletha Carrow if patient needs EGD to evaluate further.  ? ? ?Patient Care Team: ?Redmond School, MD as PCP - General (Internal Medicine) ?Minus Breeding, MD as PCP - Cardiology (Cardiology) ? ?HISTORY OF PRESENT ILLNESS: ?61 y.o. male referred by Redmond School, MD, with a past medical history of hypertension, hyperlipidemia, sleep apnea,  hypothyroidism, chronic systolic CHF  Echo in 1/60 showed EF 45% with mild diffuse hypokinesis and septal-lateral dyssynchrony, normal RV., normal coronary arteries, GERD, diverticulosis and others listed below presents for evaluation of rectal bleeding and loose stools, called 06/26/2021.  ? ?Patient known to Dr. Loletha Carrow. ?Patient has history of pneumonitis intestinalis with Ozempic use, resolved with conservative management, most recent CT 01/16/2021 showed no pneumatosis.   ?Last CBC was 01/03/2021 and showed hemoglobin 9.3 MCV 98.2. ?No GERD, no nausea, vomiting, no dyphagia.  ?He has lost weight since sept hospitilization.  ?Did not have an endoscopy in the hospital. ? ?Can have 5-7 BM's a day, loose stools since 2019 when he had his GB, can have rare constipation.  ?Will have meal and have BM about 30 mins afterwards.  ?He has some AB bloating and gas.  ?He has had 2 episodes in the last week of BRB in the toilet.  ?Thursday a week ago had 1 episode with BM, soft loose stool.  ?Then yesterday he had 2-3 BM's, then at 2 pm,  3pm, and 4 pm had BRB in the toilet some darker at the bottom.  ?He thinks he has hemorrhoids, no burning, itching. ?He has had some rectal pain with BMs, no AB pain.  ?He has had some left lower back pain x Sunday.  ?This AM he has had 2-3 BM's, can be large or small volume.  ?Has had tan thick stool lately.  ?Denies dark urine.  ?No changes in diet/meds.  ? ? ?01/16/2021 CT abdomen pelvis with contrast abdominal pain, history of pneumatosis.  Normal liver, no gallstones, no  gallbladder wall thickening or biliary dilatation.  Unremarkable pancreas, spleen normal size.  Stomach unremarkable, small bowel nondilated unremarkable, colon with diverticula but no inflammation.  No pneumatosis identified. ?03/08/2018 colonoscopy personal history of adenomatous polyps excellent bowel prep diverticula, 2 mm sessile serrated polyp transverse colon- recall 5 years. (2024) ?Underwent laproscopic chole on  09/07/17 and found to have gangrenous GB.  ?-09/09/17 repeat HIDA scan showed bile leak.  ? ERCP 09/11/17, but patient had failed stent placement. ?-ERCP 7/19 with sphincterotomy, stone removal, stent placement ? ?CBC  01/03/2021  ?HGB 9.3 MCV 98.2 with normocytic anemia ?WBC 7.8 Platelets 155 ?Kidney function 04/22/2021  ?BUN 22 Cr 1.27  ?GFR >60  ?Potassium 4.5   ?LFTs 12/28/2020  ?AST 18 ALT 27 ?Alkphos 43 TBili 0.2 ?12/28/2020 LIPASE 67 ?12/29/2020 TSH 1.033 ? ?Current Medications:  ? ?Current Outpatient Medications (Endocrine & Metabolic):  ?  dapagliflozin propanediol (FARXIGA) 10 MG TABS tablet, Take 1 tablet (10 mg total) by mouth daily before breakfast. ?  levothyroxine (SYNTHROID) 50 MCG tablet, Take 50 mcg by mouth daily. ? ?Current Outpatient Medications (Cardiovascular):  ?  carvedilol (COREG) 6.25 MG tablet, Take 1 tablet (6.25 mg total) by mouth 2 (two) times daily with a meal. ?  furosemide (LASIX) 20 MG tablet, Take 1 tablet (20 mg total) by mouth as needed for fluid or edema. ?  rosuvastatin (CRESTOR) 10 MG tablet, Take 1 tablet (10 mg total) by mouth daily. ?  sacubitril-valsartan (ENTRESTO) 97-103 MG, Take 1 tablet by mouth 2 (two) times daily. ?  spironolactone (ALDACTONE) 25 MG tablet, Take 1 tablet (25 mg total) by mouth daily. ? ? ?Current Outpatient Medications (Analgesics):  ?  allopurinol (ZYLOPRIM) 100 MG tablet, Take 100 mg by mouth 2 (two) times daily.  ? ? ?Current Outpatient Medications (Other):  ?  Calcium Carb-Cholecalciferol (CALCIUM 600 + D PO), Take 1 tablet by mouth daily. ?  Cholecalciferol (VITAMIN D3) 125 MCG (5000 UT) CAPS, Take 1 capsule by mouth daily. ?  hydrocortisone (ANUSOL-HC) 2.5 % rectal cream, Place 1 application. rectally 2 (two) times daily as needed for hemorrhoids. ?  Magnesium Oxide 400 MG CAPS, Take 1 capsule (400 mg total) by mouth in the morning and at bedtime. ?  pantoprazole (PROTONIX) 40 MG tablet, Take 1 tablet (40 mg total) by mouth daily. ?  potassium chloride  SA (KLOR-CON M) 20 MEQ tablet, TAKE 1 TABLET BY MOUTH TWICE DAILY ? ?Medical History:  ?Past Medical History:  ?Diagnosis Date  ? Arthritis   ? gout  ? Cardiomyopathy   ? EF 30-40% (Normal coronaries cath 2010).  Echo 2015 EF 40%  ? CHF (congestive heart failure) ([place])   ? Colonic polyp   ? Diverticulosis of colon   ? GERD (gastroesophageal reflux disease)   ? History of chicken pox   ? HLD (hyperlipidemia)   ? recent  ? HTN (hypertension)   ? recent  ? Sleep apnea   ? does use CPAP.    ? Thyroid disease   ? ?Allergies:  ?Allergies  ?Allergen Reactions  ? Ozempic (0.25 Or 0.5 Mg-Dose) [Semaglutide(0.25 Or 0.'5mg'$ -Dos)]   ?  GI upset on 0.'25mg'$  dose  ?  ? ?Surgical History:  ?He  has a past surgical history that includes Tonsillectomy and adenoidectomy; Cardiac catheterization (2010); Cholecystectomy (N/A, 09/07/2017); ERCP (N/A, 09/11/2017); RIGHT/LEFT HEART CATH AND CORONARY ANGIOGRAPHY (N/A, 10/12/2017); ERCP (N/A, 11/08/2017); sphincterotomy (11/08/2017); removal of stones (11/08/2017); biliary stent placement (11/08/2017); ERCP (N/A, 01/11/2018); Stent removal (01/11/2018); Colonoscopy with  propofol (N/A, 03/08/2018); and biopsy (03/08/2018). ?Family History:  ?His family history includes Arthritis in an other family member; Colon polyps in his brother and brother; Diabetes in an other family member; Hyperlipidemia in an other family member; Lung cancer in an other family member; Stroke in an other family member. ?Social History:  ? reports that he quit smoking about 41 years ago. His smoking use included cigarettes. He has a 4.50 pack-year smoking history. He has never used smokeless tobacco. He reports that he does not drink alcohol and does not use drugs. ? ?REVIEW OF SYSTEMS  : All other systems reviewed and negative except where noted in the History of Present Illness. ? ? ?PHYSICAL EXAM: ?BP 120/76   Pulse 61   Ht '5\' 10"'$  (1.778 m)   Wt 255 lb 9.6 oz (115.9 kg)   SpO2 96%   BMI 36.67 kg/m?  ?General:   Pleasant, well  developed male in no acute distress ?Head:  Normocephalic and atraumatic. ?Eyes: sclerae anicteric,conjunctive pink  ?Heart:  regular rate and rhythm ?Pulm: Clear anteriorly; no wheezing ?Abdomen:  Soft, Obese AB, skin exam

## 2021-06-28 NOTE — Progress Notes (Signed)
____________________________________________________________ ? ?Also, he does not appear to need upper endoscopy at this time. ? ?Wilfrid Lund, MD ? ?____________________________________________________________ ? ?

## 2021-06-28 NOTE — Progress Notes (Signed)
____________________________________________________________ ? ?Attending physician addendum: ? ?Thank you for sending this case to me. ?I have reviewed the entire note and agree with the plan. ? ?Thank you for the comprehensive review.  I last saw this patient in 2019 at the time of his postcholecystectomy bile leak. ?I agree bile acid diarrhea seems likely and he probably has hemorrhoidal bleeding. ?If hemorrhoids discovered on anoscopy when he follows up with me in the office, I will likely offer him banding. ? ?Wilfrid Lund, MD ? ?____________________________________________________________ ? ?

## 2021-07-01 ENCOUNTER — Other Ambulatory Visit: Payer: Self-pay

## 2021-07-01 DIAGNOSIS — R197 Diarrhea, unspecified: Secondary | ICD-10-CM

## 2021-07-01 LAB — IGA: Immunoglobulin A: 229 mg/dL (ref 70–320)

## 2021-07-01 LAB — TISSUE TRANSGLUTAMINASE, IGA: (tTG) Ab, IgA: <1 U/mL

## 2021-07-01 MED ORDER — VANCOMYCIN HCL 125 MG PO CAPS: 125.0000 mg | ORAL_CAPSULE | Freq: Four times a day (QID) | ORAL | 0 refills | Status: AC

## 2021-07-01 MED ORDER — SACCHAROMYCES BOULARDII 250 MG PO CAPS: 250.0000 mg | ORAL_CAPSULE | Freq: Two times a day (BID) | ORAL | 0 refills | Status: AC

## 2021-07-03 ENCOUNTER — Other Ambulatory Visit (INDEPENDENT_AMBULATORY_CARE_PROVIDER_SITE_OTHER): Payer: 59

## 2021-07-03 DIAGNOSIS — R197 Diarrhea, unspecified: Secondary | ICD-10-CM | POA: Diagnosis not present

## 2021-07-03 LAB — IBC + FERRITIN
Ferritin: 150.5 ng/mL (ref 22.0–322.0)
Iron: 63 ug/dL (ref 42–165)
Saturation Ratios: 20.3 % (ref 20.0–50.0)
TIBC: 310.8 ug/dL (ref 250.0–450.0)
Transferrin: 222 mg/dL (ref 212.0–360.0)

## 2021-07-03 LAB — VITAMIN B12: Vitamin B-12: 104 pg/mL — ABNORMAL LOW (ref 211–911)

## 2021-07-05 ENCOUNTER — Other Ambulatory Visit (HOSPITAL_COMMUNITY): Payer: Self-pay | Admitting: Cardiology

## 2021-07-06 LAB — PANCREATIC ELASTASE, FECAL: Pancreatic Elastase-1, Stool: >500 mcg/g

## 2021-07-06 LAB — CLOSTRIDIUM DIFFICILE TOXIN B, QUALITATIVE, REAL-TIME PCR: Toxigenic C. Difficile by PCR: DETECTED — AB

## 2021-07-22 ENCOUNTER — Other Ambulatory Visit (HOSPITAL_COMMUNITY): Payer: Self-pay | Admitting: Cardiology

## 2021-07-22 ENCOUNTER — Other Ambulatory Visit (HOSPITAL_COMMUNITY): Payer: Self-pay | Admitting: *Deleted

## 2021-07-30 ENCOUNTER — Ambulatory Visit (INDEPENDENT_AMBULATORY_CARE_PROVIDER_SITE_OTHER): Payer: 59 | Admitting: Gastroenterology

## 2021-07-30 ENCOUNTER — Encounter: Payer: Self-pay | Admitting: Gastroenterology

## 2021-07-30 VITALS — BP 110/58 | HR 69 | Ht 70.0 in | Wt 268.0 lb

## 2021-07-30 DIAGNOSIS — K9089 Other intestinal malabsorption: Secondary | ICD-10-CM | POA: Diagnosis not present

## 2021-07-30 DIAGNOSIS — K648 Other hemorrhoids: Secondary | ICD-10-CM

## 2021-07-30 DIAGNOSIS — A0472 Enterocolitis due to Clostridium difficile, not specified as recurrent: Secondary | ICD-10-CM

## 2021-07-30 DIAGNOSIS — E538 Deficiency of other specified B group vitamins: Secondary | ICD-10-CM | POA: Diagnosis not present

## 2021-07-30 MED ORDER — COLESEVELAM HCL 625 MG PO TABS: 1250.0000 mg | ORAL_TABLET | Freq: Every day | ORAL | 1 refills | Status: DC

## 2021-07-30 NOTE — Patient Instructions (Signed)
If you are age 62 or older, your body mass index should be between 23-30. Your Body mass index is 38.45 kg/m?Marland Kitchen If this is out of the aforementioned range listed, please consider follow up with your Primary Care Provider. ? ?If you are age 56 or younger, your body mass index should be between 19-25. Your Body mass index is 38.45 kg/m?Marland Kitchen If this is out of the aformentioned range listed, please consider follow up with your Primary Care Provider.  ? ?________________________________________________________ ? ?The  GI providers would like to encourage you to use [facility] to communicate with providers for non-urgent requests or questions.  Due to long hold times on the telephone, sending your provider a message by [facility] may be a faster and more efficient way to get a response.  Please allow 48 business hours for a response.  Please remember that this is for non-urgent requests.  ?_______________________________________________________ ? ?It was a pleasure to see you today! ? ?Thank you for trusting me with your gastrointestinal care!   ? ? ?

## 2021-07-30 NOTE — Progress Notes (Signed)
? ? ? ?[place] [facility] Progress Note ? ?Chief Complaint: Chronic diarrhea and rectal bleeding ? ?Subjective  ?History: ?Nicholas Gray was seen by our PA on 06/27/2021 for diarrhea occurring since his 2019 cholecystectomy as well as more recent rectal bleeding felt likely to be hemorrhoidal.  Additional lab and stool studies were done with results as below. ?C. difficile PCR was positive, and on 07/01/2021 he was prescribed vancomycin 125 mg 4 times daily for 14 days (and S. Boulardi probiotic.) ? ?Nicholas Gray is glad to report that his severe diarrhea has significantly improved after the vancomycin.  He has also continued to take the probiotic, and I recommended he can now stop that. ?He is back to his baseline bowel habits of 2-4 semiformed and sometimes loose BMs per day.  His bleeding has stopped, and he says there was really only 2 or 3 episodes of small-volume bleeding that occurred prior to his visit last month.  He denies rectal pain. ? ?When he considers it further, he recalls that the diarrhea significantly worsened about a month after his hospitalization for pneumatosis intestinalis last fall, and he received antibiotics during that treatment as well. ? ?ROS: ?Cardiovascular:  no chest pain ?Respiratory: no dyspnea ?Remainder of systems negative except as above ?Past Medical History:  ?Diagnosis Date  ? Arthritis   ? gout  ? Cardiomyopathy   ? EF 30-40% (Normal coronaries cath 2010).  Echo 2015 EF 40%  ? CHF (congestive heart failure) ([place])   ? Colonic polyp   ? Diverticulosis of colon   ? GERD (gastroesophageal reflux disease)   ? History of chicken pox   ? HLD (hyperlipidemia)   ? recent  ? HTN (hypertension)   ? recent  ? Sleep apnea   ? does use CPAP.    ? Thyroid disease   ? ? ?The patient's Past Medical, Family and Social History were reviewed and are on file in the EMR. ? ?Objective: ? ?Med list reviewed ? ?Current Outpatient Medications:  ?  allopurinol (ZYLOPRIM) 100 MG tablet, Take 100 mg by mouth 2 (two) times  daily. , Disp: , Rfl:  ?  Calcium Carb-Cholecalciferol (CALCIUM 600 + D PO), Take 1 tablet by mouth daily., Disp: , Rfl:  ?  carvedilol (COREG) 6.25 MG tablet, Take 1 tablet (6.25 mg total) by mouth 2 (two) times daily with a meal., Disp: 60 tablet, Rfl: 3 ?  Cholecalciferol (VITAMIN D3) 125 MCG (5000 UT) CAPS, Take 1 capsule by mouth daily., Disp: , Rfl:  ?  colesevelam (WELCHOL) 625 MG tablet, Take 2 tablets (1,250 mg total) by mouth daily before lunch. Take 2 hours before or after other medicines, Disp: 60 tablet, Rfl: 1 ?  FARXIGA 10 MG TABS tablet, TAKE 1 TABLET DAILY BEFORE BREAKFAST., Disp: 30 tablet, Rfl: 11 ?  furosemide (LASIX) 20 MG tablet, Take 1 tablet (20 mg total) by mouth as needed for fluid or edema., Disp: 30 tablet, Rfl: 3 ?  hydrocortisone (ANUSOL-HC) 2.5 % rectal cream, Place 1 application. rectally 2 (two) times daily as needed for hemorrhoids., Disp: 60 g, Rfl: 1 ?  levothyroxine (SYNTHROID) 50 MCG tablet, Take 50 mcg by mouth daily., Disp: , Rfl:  ?  Magnesium Oxide 400 MG CAPS, Take 1 capsule (400 mg total) by mouth in the morning and at bedtime., Disp: 30 capsule, Rfl: 6 ?  pantoprazole (PROTONIX) 40 MG tablet, Take 1 tablet (40 mg total) by mouth daily., Disp: 30 tablet, Rfl: 1 ?  potassium chloride SA (KLOR-CON M)  20 MEQ tablet, TAKE 1 TABLET BY MOUTH TWICE DAILY, Disp: 60 tablet, Rfl: 6 ?  rosuvastatin (CRESTOR) 10 MG tablet, Take 1 tablet (10 mg total) by mouth daily., Disp: 30 tablet, Rfl: 11 ?  saccharomyces boulardii (FLORASTOR) 250 MG capsule, Take 1 capsule (250 mg total) by mouth 2 (two) times daily., Disp: 60 capsule, Rfl: 0 ?  sacubitril-valsartan (ENTRESTO) 97-103 MG, Take 1 tablet by mouth 2 (two) times daily., Disp: 180 tablet, Rfl: 3 ?  spironolactone (ALDACTONE) 25 MG tablet, Take 1 tablet (25 mg total) by mouth daily., Disp: 90 tablet, Rfl: 3 ? ? ?Vital signs in last 24 hrs: ?Vitals:  ? 07/30/21 0816  ?BP: (!) 110/58  ?Pulse: 69  ? ?Wt Readings from Last 3 Encounters:   ?07/30/21 268 lb (121.6 kg)  ?06/27/21 255 lb 9.6 oz (115.9 kg)  ?05/16/21 260 lb 6.4 oz (118.1 kg)  ?  ?Physical Exam ? ?Well-appearing ?HEENT: sclera anicteric, oral mucosa moist without lesions ?Neck: supple, no thyromegaly, JVD or lymphadenopathy ?Cardiac: RRR without murmurs, S1S2 heard, mild bilateral pretibial edema ?Pulm: clear to auscultation bilaterally, normal RR and effort noted ?Abdomen: soft, no tenderness, with active bowel sounds. No guarding or palpable hepatosplenomegaly. ?Skin; warm and dry, no jaundice or rash ? ?Labs: ? ? ?  Latest Ref Rng & Units 06/27/2021  ?  2:16 PM 01/03/2021  ?  5:00 AM 01/02/2021  ?  5:01 AM  ?CBC  ?WBC 4.0 - 10.5 K/uL 7.2   7.8   6.6    ?Hemoglobin 13.0 - 17.0 g/dL 12.9   9.3   9.3    ?Hematocrit 39.0 - 52.0 % 38.3   28.0   27.4    ?Platelets 150.0 - 400.0 K/uL 219.0   155   138    ? ? ?  Latest Ref Rng & Units 06/27/2021  ?  2:16 PM 04/22/2021  ?  9:48 AM 03/14/2021  ?  9:40 AM  ?CMP  ?Glucose 70 - 99 mg/dL 88   104   103    ?BUN 6 - 23 mg/dL '23   22   10    '$ ?Creatinine 0.40 - 1.50 mg/dL 1.50   1.27   1.02    ?Sodium 135 - 145 mEq/L 134   133   136    ?Potassium 3.5 - 5.1 mEq/L 4.5   4.5   3.9    ?Chloride 96 - 112 mEq/L 99   97   103    ?CO2 19 - 32 mEq/L '27   24   24    '$ ?Calcium 8.4 - 10.5 mg/dL 9.9   9.7   7.7    ?Total Protein 6.0 - 8.3 g/dL 7.6      ?Total Bilirubin 0.2 - 1.2 mg/dL 1.0      ?Alkaline Phos 39 - 117 U/L 49      ?AST 0 - 37 U/L 16      ?ALT 0 - 53 U/L 14      ? ?Iron studies normal ?TTG IgA antibody negative, total IgA normal at 229 ? ?C. difficile PCR positive ?Fecal elastase over 500 ?B12 low at 104 ?TSH normal ?___________________________________________ ?Radiologic studies: ? ? ?____________________________________________ ?Other: ? ? ?_____________________________________________ ?Assessment & Plan  ?Assessment: ?Encounter Diagnoses  ?Name Primary?  ? C. difficile diarrhea Yes  ? Bile salt-induced diarrhea   ? Bleeding internal hemorrhoids   ? B12  deficiency   ? ?I believe Nicholas Gray has had bile acid diarrhea since his  2019 cholecystectomy and more severe diarrhea due to C. difficile likely contracted during hospitalization last October. ?C. difficile now sounds successfully treated and he is much improved, back to his baseline bowel function.  We discussed bile acid diarrhea, the nature of it, diagrams of the anatomy shown, and possible treatment.  He is on many medicines, so the timing of bile acid binding agent would probably only allow once daily dosing.  He takes medicines around 8:52 in the morning and again about 9 PM, so he could take a dose midday.  I described Questran powder, he says that would be challenging since he works driving a Printmaker every day, and he preferred something in tablet form if possible. ? ?In addition, he had brief hemorrhoidal bleeding when the diarrhea was more severe and it has now stopped.  Should that recur, I have asked him to contact me for evaluation so I can do any anoscopy and offer hemorrhoidal banding. ? ?We will again forward his note and results to primary care and asked that they start B12 replacement weekly for a month and monthly thereafter.  He has not heard from them since the lab results last month. ? ?Plan: ? ?Bleach wipes I touched surfaces in the home to decrease chance of recontracting C. Difficile ? ?Stop probiotic ? ?WelChol 625 mg, 2 tablets once daily about midday. ? ?He will let me know by portal message or phone call how things are going without medicine and if we need to make dose adjustments, and also if he is having recurrence of hemorrhoidal bleeding. ? ?32 minutes were spent on this encounter (including chart review, history/exam, counseling/coordination of care, and documentation) > 50% of that time was spent on counseling and coordination of care.  ? ?Nelida Meuse III ? ?

## 2021-08-14 ENCOUNTER — Other Ambulatory Visit (HOSPITAL_COMMUNITY): Payer: Self-pay | Admitting: Cardiology

## 2021-08-16 ENCOUNTER — Other Ambulatory Visit (HOSPITAL_COMMUNITY): Payer: Self-pay | Admitting: *Deleted

## 2021-08-16 MED ORDER — ENTRESTO 97-103 MG PO TABS: 1.0000 | ORAL_TABLET | Freq: Two times a day (BID) | ORAL | 3 refills | Status: DC

## 2021-08-16 MED ORDER — CARVEDILOL 6.25 MG PO TABS: 6.2500 mg | ORAL_TABLET | Freq: Two times a day (BID) | ORAL | 3 refills | Status: DC

## 2021-08-27 ENCOUNTER — Other Ambulatory Visit (HOSPITAL_COMMUNITY): Payer: Self-pay | Admitting: Cardiology

## 2021-08-27 ENCOUNTER — Ambulatory Visit: Payer: 59 | Admitting: Gastroenterology

## 2021-10-17 ENCOUNTER — Ambulatory Visit (HOSPITAL_BASED_OUTPATIENT_CLINIC_OR_DEPARTMENT_OTHER)
Admission: RE | Admit: 2021-10-17 | Discharge: 2021-10-17 | Disposition: A | Discharge status: Home / Self Care | Payer: 59 | Source: Ambulatory Visit | Attending: Cardiology | Admitting: Cardiology

## 2021-10-17 ENCOUNTER — Ambulatory Visit (HOSPITAL_COMMUNITY)
Admission: RE | Admit: 2021-10-17 | Discharge: 2021-10-17 | Disposition: A | Discharge status: Home / Self Care | Payer: 59 | Source: Ambulatory Visit | Attending: Internal Medicine | Admitting: Internal Medicine

## 2021-10-17 ENCOUNTER — Other Ambulatory Visit: Payer: Self-pay | Admitting: Gastroenterology

## 2021-10-17 ENCOUNTER — Encounter (HOSPITAL_COMMUNITY): Payer: Self-pay | Admitting: Cardiology

## 2021-10-17 VITALS — BP 112/70 | HR 50 | Wt 266.4 lb

## 2021-10-17 DIAGNOSIS — I447 Left bundle-branch block, unspecified: Secondary | ICD-10-CM | POA: Diagnosis not present

## 2021-10-17 DIAGNOSIS — Z7901 Long term (current) use of anticoagulants: Secondary | ICD-10-CM | POA: Diagnosis not present

## 2021-10-17 DIAGNOSIS — E785 Hyperlipidemia, unspecified: Secondary | ICD-10-CM | POA: Diagnosis not present

## 2021-10-17 DIAGNOSIS — E669 Obesity, unspecified: Secondary | ICD-10-CM | POA: Insufficient documentation

## 2021-10-17 DIAGNOSIS — I5022 Chronic systolic (congestive) heart failure: Secondary | ICD-10-CM | POA: Diagnosis present

## 2021-10-17 DIAGNOSIS — I428 Other cardiomyopathies: Secondary | ICD-10-CM | POA: Insufficient documentation

## 2021-10-17 DIAGNOSIS — Z9989 Dependence on other enabling machines and devices: Secondary | ICD-10-CM | POA: Insufficient documentation

## 2021-10-17 DIAGNOSIS — Z7182 Exercise counseling: Secondary | ICD-10-CM | POA: Diagnosis not present

## 2021-10-17 DIAGNOSIS — G4733 Obstructive sleep apnea (adult) (pediatric): Secondary | ICD-10-CM | POA: Diagnosis not present

## 2021-10-17 DIAGNOSIS — Z79899 Other long term (current) drug therapy: Secondary | ICD-10-CM | POA: Insufficient documentation

## 2021-10-17 DIAGNOSIS — I110 Hypertensive heart disease with heart failure: Secondary | ICD-10-CM | POA: Insufficient documentation

## 2021-10-17 DIAGNOSIS — Z6838 Body mass index (BMI) 38.0-38.9, adult: Secondary | ICD-10-CM | POA: Insufficient documentation

## 2021-10-17 LAB — ECHOCARDIOGRAM COMPLETE
Area-P 1/2: 3.17 cm2
Calc EF: 50.3 %
S' Lateral: 3.8 cm
Single Plane A2C EF: 50.3 %
Single Plane A4C EF: 49.3 %

## 2021-10-17 LAB — LIPID PANEL
Cholesterol: 98 mg/dL (ref 0–200)
HDL: 30 mg/dL — ABNORMAL LOW (ref >40)
LDL Cholesterol: 49 mg/dL (ref 0–99)
Total CHOL/HDL Ratio: 3.3 RATIO
Triglycerides: 95 mg/dL (ref <150)
VLDL: 19 mg/dL (ref 0–40)

## 2021-10-17 LAB — BASIC METABOLIC PANEL
Anion gap: 6 (ref 5–15)
BUN: 15 mg/dL (ref 8–23)
CO2: 25 mmol/L (ref 22–32)
Calcium: 9 mg/dL (ref 8.9–10.3)
Chloride: 104 mmol/L (ref 98–111)
Creatinine, Ser: 1.22 mg/dL (ref 0.61–1.24)
GFR, Estimated: >60 mL/min (ref >60)
Glucose, Bld: 99 mg/dL (ref 70–99)
Potassium: 4.5 mmol/L (ref 3.5–5.1)
Sodium: 135 mmol/L (ref 135–145)

## 2021-10-17 NOTE — Patient Instructions (Signed)
Medication Changes:  None, continue current medications  Lab Work:  Labs done today, your results will be available in MyChart, we will contact you for abnormal readings.  Your physician recommends that you return for lab work in: 3 months (October), we have placed an order for you to have this done at [facility]  Testing/Procedures:  none  Referrals:  None  Special Instructions // Education:  Do the following things EVERYDAY: Weigh yourself in the morning before breakfast. Write it down and keep it in a log. Take your medicines as prescribed Eat low salt foods--Limit salt (sodium) to 2000 mg per day.  Stay as active as you can everyday Limit all fluids for the day to less than 2 liters   Follow-Up in: 6 months (Jan 2024), **PLEASE CALL OUR OFFICE IN NOVEMBER TO SCHEDULE THIS APPOINTMENT  At the [facility], you and your health needs are our priority. We have a designated team specialized in the treatment of Heart Failure. This Care Team includes your primary Heart Failure Specialized Cardiologist (physician), Advanced Practice Providers (APPs- Physician Assistants and Nurse Practitioners), and Pharmacist who all work together to provide you with the care you need, when you need it.   You may see any of the following providers on your designated Care Team at your next follow up:  Dr Glori Bickers Dr Haynes Kerns, NP Lyda Jester, [place] [facility], [place] Audry Riles, PharmD   Please be sure to bring in all your medications bottles to every appointment.   Need to Contact us:  If you have any questions or concerns before your next appointment please send [place] a message through [place] or call our office at [phone].    TO LEAVE A MESSAGE FOR THE NURSE SELECT OPTION 2, PLEASE LEAVE A MESSAGE INCLUDING: YOUR NAME DATE OF BIRTH CALL BACK NUMBER REASON FOR CALL**this is important as we prioritize the call  backs  YOU WILL RECEIVE A CALL BACK THE SAME DAY AS LONG AS YOU CALL BEFORE 4:00 PM

## 2021-10-18 NOTE — Progress Notes (Signed)
[facility] Note   PCP: Redmond School, MD PCP-Cardiologist: Minus Breeding, MD  HF Cardiology: Dr. [name]  HPI:  Nicholas Gray is a 61 y.o. male with chronic systolic CHF, NICM (Normal coronaries 2010), HTN, LBBB, Obesity, and OSA on CPAP.  He has been known to have a cardiomyopathy for the last 10 years or so.   Seen in [facility] office 08/13/17 and was overall feeling well. No new symptoms.  Echo 08/27/17 LVEF 25-30%, Grade 1 DD, Mild MR, Mild LAE, mild RV dilation, PA peak pressure 34 mm Hg.  Referred by Dr. Percival Spanish to CHF team for further evaluation and treatment of CHF.    Pt admitted 5/30 - 09/13/2017 with Chest/upper abdominal discomfort and pain. CT scan and HIDA consistent with acute cholecystitis. Underwent laproscopic chole on 09/07/17 and found to have gangrenous GB. 09/09/17 repeat HIDA scan showed bile leak. ERCP 09/11/17, but patient had failed stent placement. Considered for IR for drain placement, but decided on conservative management.  He was admitted again with RUQ pain in 7/19, had ERCP with bile leak and choledocholithiasis noted.  He had stone removal and stent placed.   LHC/RHC in 7/19 showed no significant CAD, optimized filling pressures and preserved cardiac output. Cardiac MRI in 11/19 showed improvement in LV EF to 51% with mildly decreased RV systolic function, no LGE.   Echo in 5/21 showed EF in the range of 40% though it is a technically difficult study.  There is septal-lateral dyssynchrony. Cardiac MRI in 6/21 showed LV EF 46% with septal-lateral dyssynchrony, RV EF 51%, no LGE.  Echo in 8/22 showed EF 45% with mild diffuse hypokinesis and septal-lateral dyssynchrony, normal RV.   Patient started Ozempic for weight loss.  He lost weight but developed profuse diarrhea and actually ended up in the hospital with pneumatosis intestinalis.  He is now off Ozempic.   Echo was done today and reviewed, EF 45%, septal-lateral dyssynchrony, normal RV systolic  function with mild RV enlargement, IVC normal.    He returns today for followup of CHF.  Weight is up 10 lbs.  He attributes this to lack of exercise and poor diet. No significant exertional dyspnea though he "wears out" fast.  No chest pain.  No lightheadedness.  No orthopnea/PND.   ECG (personally reviewed): NSR, LBBB 160 msec  Labs (6/19): K 4.2, creatinine 1.14 Labs (8/19): K 3.9, creatinine 1.14, normal LFTs Labs (01/05/2018): K 4.5 creatinine 1.23  Labs (11/19): K 3.9, creatinine 1.16 Labs (1/20): K 3.9, creatinine 1.13 Labs (3/21): K 4.2, creatinine 1.28, hgb 13.7, LDL 57, HDL 28 Labs (5/21): K 4.2, creatinine 1.33 Labs (11/21): K 4.1, creatinine 1.43 Labs (1/22): LDL 48 Labs (4/22): K 3.8, creatinine 1.16 Labs (10/22): LDL 45 Labs (12/22): Mg 1.8, K 3.9, creatinine 1.02 Labs (3/23): K 4.5, creatinine 1.5  Social History: Lives in [place], [place]. 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 Denies heavy ETOH use.  Family history: He denies immediate family history of CHF, but thinks his maternal grandparents may have had "heart problems"   Review of systems complete and found to be negative unless listed in HPI.    Past Medical History: 1. Chronic systolic CHF: Suspected nonischemic cardiomyopathy (normal coronaries 2010).  - Echo 2012 with EF 30% - Echo 12/15 with EF 40-45% - Echo 2017 with EF 35-40% - Echo 08/27/17 LVEF 25-30%, Grade 1 DD, Mild MR, Mild LAE, mild RV dilation, PA peak pressure 34  mm Hg.  - LHC/RHC (7/19): No significant coronary disease. RA 7, PA 39/12 mean 25, PCWP mean 11, CI 3.68, PVR 1.65 WU.  - Cardiac MRI (11/19): LV EF 51% with septal-lateral dyssynchrony, mild RV dilation with RV EF 39%, no LGE.  - Echo (5/21): Technically difficult, EF around 40% with diffuse hypokinesis and septal-lateral dyssynchrony, normal RV.  - Cardiac MRI (6/21): LV EF 46% with septal-lateral dyssynchrony, RV EF 51%, no LGE.  - Echo (8/22): EF  45% with mild diffuse hypokinesis and septal-lateral dyssynchrony, normal RV.  - Echo (7/23): EF 45%, septal-lateral dyssynchrony, normal RV systolic function with mild RV enlargement, IVC normal.   2. HTN 3. LBBB, chronic 4. Obesity Body mass index is 38.22 kg/m. 5. OSA: Reports compliance with his CPAP.  6. GERD 7. Cholecystitis: Underwent laproscopic chole on 09/07/17 and found to have gangrenous GB.  - 09/09/17 repeat HIDA scan showed bile leak.  - ERCP 09/11/17, but patient had failed stent placement. - ERCP 7/19 with sphincterotomy, stone removal, stent placement.   Current Outpatient Medications  Medication Sig Dispense Refill   allopurinol (ZYLOPRIM) 100 MG tablet Take 100 mg by mouth 2 (two) times daily.      Calcium Carb-Cholecalciferol (CALCIUM 600 + D PO) Take 1 tablet by mouth 2 (two) times daily.     carvedilol (COREG) 6.25 MG tablet Take 1 tablet (6.25 mg total) by mouth 2 (two) times daily with a meal. 60 tablet 3   Cholecalciferol (VITAMIN D3) 125 MCG (5000 UT) TABS Take 1 tablet by mouth in the morning and at bedtime.     colesevelam (WELCHOL) 625 MG tablet Take 2 tablets (1,250 mg total) by mouth daily before lunch. Take 2 hours before or after other medicines 60 tablet 1   FARXIGA 10 MG TABS tablet TAKE 1 TABLET DAILY BEFORE BREAKFAST. 30 tablet 11   furosemide (LASIX) 20 MG tablet Take 1 tablet (20 mg total) by mouth as needed for fluid or edema. 30 tablet 3   hydrocortisone (ANUSOL-HC) 2.5 % rectal cream Place 1 application. rectally 2 (two) times daily as needed for hemorrhoids. 60 g 1   levothyroxine (SYNTHROID) 50 MCG tablet Take 50 mcg by mouth daily.     Magnesium Oxide 400 MG CAPS Take 1 capsule (400 mg total) by mouth in the morning and at bedtime. 30 capsule 6   pantoprazole (PROTONIX) 40 MG tablet Take 1 tablet (40 mg total) by mouth daily. 30 tablet 1   potassium chloride SA (KLOR-CON M) 20 MEQ tablet TAKE 1 TABLET BY MOUTH TWICE DAILY 60 tablet 6   rosuvastatin  (CRESTOR) 10 MG tablet Take 1 tablet (10 mg total) by mouth daily. 30 tablet 11   sacubitril-valsartan (ENTRESTO) 97-103 MG Take 1 tablet by mouth 2 (two) times daily. 180 tablet 3   spironolactone (ALDACTONE) 25 MG tablet TAKE 1 TABLET BY MOUTH DAILY. 90 tablet 3   No current facility-administered medications for this encounter.   Allergies  Allergen Reactions   Ozempic (0.25 Or 0.5 Mg-Dose) [Semaglutide(0.25 Or 0.'5mg'$ -Dos)]     GI upset on 0.'25mg'$  dose    Vitals:   10/17/21 0916  BP: 112/70  Pulse: (!) 50  SpO2: 96%  Weight: 120.8 kg (266 lb 6.4 oz)   Wt Readings from Last 3 Encounters:  10/17/21 120.8 kg (266 lb 6.4 oz)  07/30/21 121.6 kg (268 lb)  06/27/21 115.9 kg (255 lb 9.6 oz)    PHYSICAL EXAM: General: NAD Neck: No JVD, no thyromegaly  or thyroid nodule.  Lungs: Clear to auscultation bilaterally with normal respiratory effort. CV: Nondisplaced PMI.  Heart regular S1/S2, no S3/S4, no murmur.  No peripheral edema.  No carotid bruit.  Normal pedal pulses.  Abdomen: Soft, nontender, no hepatosplenomegaly, no distention.  Skin: Intact without lesions or rashes.  Neurologic: Alert and oriented x 3.  Psych: Normal affect. Extremities: No clubbing or cyanosis.  HEENT: Normal.   ASSESSMENT & PLAN:  1. Chronic systolic CHF: Nonischemic cardiomyopathy by coronary angiography in 2010 and 7/19.  He has had a cardiomyopathy for at least the last 10 years.  Echo in 5/19 showed a fall in EF to the 25-30% range.  He does not have an ICD as prior echoes had shown EF in the 40% range.  It is possible that he has a LBBB cardiomyopathy.  Prior viral myocarditis is also a possibility. No significant FH of CHF and no heavy ETOH/substance abuse.  Cardiac MRI in 6/21 showed LV EF 46% with septal-lateral dyssynchrony, RV EF 51%, no LGE.  Echo in 8/22 and again in 7/23 showed EF 45% with septal-lateral dyssynchrony.  He is not volume overloaded.  NYHA class II symptoms.   - He does not appear to  need standing Lasix.  - Continue current Entresto, spironolactone, and dapagliflozin (all at goal).  - Continue Coreg 6.25 mg bid, will not increase with mild bradycardia.  - BMET today.     - EF has been out of CRT-D range.    2. HTN: BP controlled.  3. LBBB: Chronic, unchanged.  4. Obesity: Body mass index is 38.22 kg/m.  - Encouraged weight loss by diet/exercise.  - He did not tolerate semaglutide witih profuse diarrhea.  5. OSA: Continue CPAP.  6. Hyperlipidemia: ?Myalgias related to simvastatin.  - Continue Crestor, check lipids today.   Followup in 6 months APP but will get BMET in 3 months.    [name] 10/18/2021

## 2021-11-18 ENCOUNTER — Other Ambulatory Visit (HOSPITAL_COMMUNITY): Payer: Self-pay | Admitting: Cardiology

## 2021-12-19 ENCOUNTER — Other Ambulatory Visit (HOSPITAL_COMMUNITY): Payer: Self-pay

## 2021-12-19 ENCOUNTER — Other Ambulatory Visit (HOSPITAL_COMMUNITY): Payer: Self-pay | Admitting: Family Medicine

## 2021-12-19 ENCOUNTER — Telehealth (HOSPITAL_COMMUNITY): Payer: Self-pay | Admitting: Pharmacy Technician

## 2021-12-19 NOTE — Telephone Encounter (Signed)
Patient Advocate Encounter   Received notification from [place] that prior authorization for [name] is required.   PA submitted on CoverMyMeds Key [id number] Status is pending   Will continue to follow.

## 2021-12-20 DIAGNOSIS — E538 Deficiency of other specified B group vitamins: Secondary | ICD-10-CM | POA: Diagnosis not present

## 2021-12-20 NOTE — Telephone Encounter (Signed)
Advanced Heart Failure Patient Advocate Encounter  Prior Authorization for [name] has been approved.    PA# [id number] Effective dates: 12/19/21 through 12/20/22  Charlann Boxer, CPhT

## 2022-01-06 ENCOUNTER — Other Ambulatory Visit (HOSPITAL_COMMUNITY): Payer: Self-pay | Admitting: Cardiology

## 2022-01-17 DIAGNOSIS — E538 Deficiency of other specified B group vitamins: Secondary | ICD-10-CM | POA: Diagnosis not present

## 2022-01-17 DIAGNOSIS — Z23 Encounter for immunization: Secondary | ICD-10-CM | POA: Diagnosis not present

## 2022-02-14 DIAGNOSIS — E538 Deficiency of other specified B group vitamins: Secondary | ICD-10-CM | POA: Diagnosis not present

## 2022-02-20 DIAGNOSIS — I10 Essential (primary) hypertension: Secondary | ICD-10-CM | POA: Diagnosis not present

## 2022-02-20 DIAGNOSIS — I428 Other cardiomyopathies: Secondary | ICD-10-CM | POA: Diagnosis not present

## 2022-02-20 DIAGNOSIS — E6609 Other obesity due to excess calories: Secondary | ICD-10-CM | POA: Diagnosis not present

## 2022-02-20 DIAGNOSIS — I700 Atherosclerosis of aorta: Secondary | ICD-10-CM | POA: Diagnosis not present

## 2022-02-20 DIAGNOSIS — H1033 Unspecified acute conjunctivitis, bilateral: Secondary | ICD-10-CM | POA: Diagnosis not present

## 2022-02-20 DIAGNOSIS — J209 Acute bronchitis, unspecified: Secondary | ICD-10-CM | POA: Diagnosis not present

## 2022-02-20 DIAGNOSIS — I5020 Unspecified systolic (congestive) heart failure: Secondary | ICD-10-CM | POA: Diagnosis not present

## 2022-02-20 DIAGNOSIS — Z6839 Body mass index (BMI) 39.0-39.9, adult: Secondary | ICD-10-CM | POA: Diagnosis not present

## 2022-03-17 DIAGNOSIS — E538 Deficiency of other specified B group vitamins: Secondary | ICD-10-CM | POA: Diagnosis not present

## 2022-03-24 ENCOUNTER — Encounter: Payer: Self-pay | Admitting: Cardiovascular Disease

## 2022-03-24 ENCOUNTER — Ambulatory Visit: Payer: 59 | Attending: Cardiovascular Disease | Admitting: Cardiovascular Disease

## 2022-03-24 VITALS — BP 132/70 | HR 60 | Ht 70.0 in | Wt 280.0 lb

## 2022-03-24 DIAGNOSIS — I447 Left bundle-branch block, unspecified: Secondary | ICD-10-CM | POA: Diagnosis not present

## 2022-03-24 DIAGNOSIS — G4733 Obstructive sleep apnea (adult) (pediatric): Secondary | ICD-10-CM

## 2022-03-24 DIAGNOSIS — I428 Other cardiomyopathies: Secondary | ICD-10-CM

## 2022-03-24 DIAGNOSIS — I5022 Chronic systolic (congestive) heart failure: Secondary | ICD-10-CM

## 2022-03-24 NOTE — Progress Notes (Signed)
Cardiology Office Note    Date:  03/28/2022   ID:  DAUNDRE BIEL, DOB 1961/01/30, MRN [id number]  PCP:  Redmond School, MD  Cardiologist:  Shelva Majestic, MD (sleep); Dr. Percival Spanish, Dr. [name]  No chief complaint on file.  21 month F/U Sleep Clinic evaluation.   History of Present Illness:  Nicholas Gray is a 61 y.o. male who is now  followed by Dr. [name] for his cardiology care.  I last saw him in March 2022.  He presents for a follow-up sleep evaluation.  NicholasGray has a history of a nonischemic cardiomyopathy with an ejection fraction of 30-40% in 2010, and his most recent echo in September 2017 showed an EF of 35-40% with grade 1 diastolic dysfunction and moderate pulmonary hypertension.  He admits to recent increasing shortness of breath with uphill walking and notes mild ankle swelling.  He was initially referred for a sleep study in 2010 at [facility].  He had seen Dr. Dagmar Hait  on one occasion in February 2011.  He initially tried therapy, but abandoned this shortly thereafter, and never had follow-up evaluation.   He was ultimately referred back for a split night sleep study on 05/13/2015 due to symptoms of snoring, witnessed apnea, and nonrestorative sleep.  This demonstrated severe sleep apnea with an AHI of 57.4 per hour.  AHI during REM sleep was 49.4/h and with supine sleep was 78.1/h.  He had significant oxygen desaturation to 73% and time below 88% was 34.6 minutes.  CPAP titration was initiated.  During a split-night study, and a 12 cm water pressure was recommended. [facility] is his DME.  For some reason, after initiating CPAP therapy one year ago, he never presented to the sleep clinic for initial assessment.  However, since initiating CPAP therapy, he is sleeping better.  He is unaware of any breakthrough snoring.  I saw him for initial sleep evaluation and April 2018.  A download was obtained from 06/29/2016 through 07/28/2016.  He was meeting  compliance standards with 100% usage days and usage greater than 4 hours.  He is averaging 7 hours and 43 minutes of CPAP use per night.  AHI at 12 cwp is 4.4.  He has an air-fit F10, size small mask.  Further review of his download indicates that there were at least 3 nights where his AHI was >5/h with a peak up to 8.8.  Of note, he admits to a 30 pound weight gain over the past 6 months.  He is unaware of any breakthrough snoring.  His sleep is more restorative.  He is unaware of any parasomnias, or nocturnal palpitations. He denies daytime sleepiness, hypersomnolence, bruxism, restless legs, hypnogognic hallucinations, no cataplexy.  An Epworth Sleepiness Scale was recalculated in the office today which arteries against residual daytime sleepiness as shown below.  Epworth Sleepiness Scale: Situation   Chance of Dozing/Sleeping (0 = never , 1 = slight chance , 2 = moderate chance , 3 = high chance )   sitting and reading 2   watching TV 1   sitting inactive in a public place 0   being a passenger in a motor vehicle for an hour or more 0   lying down in the afternoon 3   sitting and talking to someone 0   sitting quietly after lunch (no alcohol) 1   while stopped for a few minutes in traffic as the driver 0   Total Score  7  I saw him in April 2019, [name] was compliant with CPAP therapy.  He remained active and was working at least 6 days/week.  He has more energy since initiating CPAP.  He typically works 11 hours/day.  He is up at 4 AM and works between 6 AM and 5 PM.  He typically goes to bed at 8 PM and is asleep by 9 PM.  On Saturday he works from 6 AM until noon.  New download was obtained in the office from July 04, 2017 through August 02, 2017 showed 100% compliance with average usage 7 hours and 43 minutes of sleep per night.  His CPAP machine was set at 13 cm, AHI was 4.1.  He denied any residual daytime sleepiness.   He sees Dr. [name] for advanced heart failure clinic care and has  not seen Dr. Percival Spanish.  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  When I saw him on June 18, 2020 he was using CPAP with 100% compliance and denied residual daytime sleepiness.  Epworth scale endorsed at 5. A download from May 16, 2020 through June 14, 2020 which confirmsed100% compliance and average use at 8 hours and 17 minutes.  At 13 cm water pressure AHI is 3.3.  He [facility] had [facility] which transition to [place].  However he has not had gotten any supplies in some time.  He uses a full facemask.  I reviewed his app today.  There are some nights where his AHI is slightly above 5.  He typically goes to bed at 10 PM and wakes up at 6 AM.  He denied any residual snoring, daytime sleepiness, bruxism, hypnagogic hallucinations or cataplectic events.    Presently, he continues to use CPAP.  His blood pressure at home has been stable in the 220-2 10 systolic range on his regimen of carvedilol 6.25 mg twice a day, Entresto 97/103 mg twice a day, Farxiga 10 mg, furosemide 20 mg as needed in addition to spironolactone 25 mg daily.  He is on levothyroxine for hypothyroidism and pantoprazole for GERD.  His CPAP unit is from June 21, 2015.  He brought his machine with him today so we could obtain a download since his machine is no longer wireless with upgrade to [employer].  Usage is 100% with average use at 7 hours and 44 minutes.  AHI is 2.0 at his 14 cm set pressure.  He qualifies for a new machine and presents for face-to-face evaluation.  Past Medical History:  Diagnosis Date   Arthritis    gout   Cardiomyopathy    EF 30-40% (Normal coronaries cath 2010).  Echo 2015 EF 40%   CHF (congestive heart failure) (HCC)    Colonic polyp    Diverticulosis of colon    GERD (gastroesophageal reflux disease)    History of chicken pox    HLD (hyperlipidemia)    recent   HTN (hypertension)    recent   Sleep apnea     does use CPAP.     Thyroid disease     Past Surgical History:  Procedure Laterality Date   BILIARY STENT PLACEMENT  11/08/2017   Procedure: BILIARY STENT PLACEMENT;  Surgeon: Carol Ada, MD;  Location: [facility] ENDOSCOPY;  Service: Endoscopy;;   BIOPSY  03/08/2018   Procedure: BIOPSY;  Surgeon: Doran Stabler, MD;  Location: [facility] ENDOSCOPY;  Service: Gastroenterology;;   CARDIAC CATHETERIZATION  2010   CHOLECYSTECTOMY  N/A 09/07/2017   Procedure: LAPAROSCOPIC CHOLECYSTECTOMY;  Surgeon: Kinsinger, Arta Bruce, MD;  Location: [place];  Service: General;  Laterality: N/A;  LAPAROSCOPIC CHOLECYSTECTOMY   COLONOSCOPY WITH PROPOFOL N/A 03/08/2018   Procedure: COLONOSCOPY WITH PROPOFOL;  Surgeon: Doran Stabler, MD;  Location: [facility] ENDOSCOPY;  Service: Gastroenterology;  Laterality: N/A;   ERCP N/A 09/11/2017   Procedure: ENDOSCOPIC RETROGRADE CHOLANGIOPANCREATOGRAPHY (ERCP);  Surgeon: Doran Stabler, MD;  Location: [place];  Service: Gastroenterology;  Laterality: N/A;   ERCP N/A 11/08/2017   Procedure: ENDOSCOPIC RETROGRADE CHOLANGIOPANCREATOGRAPHY (ERCP);  Surgeon: Carol Ada, MD;  Location: [place];  Service: Endoscopy;  Laterality: N/A;   ERCP N/A 01/11/2018   Procedure: ENDOSCOPIC RETROGRADE CHOLANGIOPANCREATOGRAPHY;  Surgeon: Doran Stabler, MD;  Location: [facility] ENDOSCOPY;  Service: Gastroenterology;  Laterality: N/A;  With Balloon Sweep of Ducts   REMOVAL OF STONES  11/08/2017   Procedure: REMOVAL OF STONES;  Surgeon: Carol Ada, MD;  Location: [place];  Service: Endoscopy;;   RIGHT/LEFT HEART CATH AND CORONARY ANGIOGRAPHY N/A 10/12/2017   Procedure: RIGHT/LEFT HEART CATH AND CORONARY ANGIOGRAPHY;  Surgeon: Larey Dresser, MD;  Location: [place] CV LAB;  Service: Cardiovascular;  Laterality: N/A;   SPHINCTEROTOMY  11/08/2017   Procedure: SPHINCTEROTOMY;  Surgeon: Carol Ada, MD;  Location: [place];  Service: Endoscopy;;   STENT REMOVAL  01/11/2018   Procedure: STENT  REMOVAL;  Surgeon: Doran Stabler, MD;  Location: [facility] ENDOSCOPY;  Service: Gastroenterology;;   TONSILLECTOMY AND ADENOIDECTOMY      Current Medications: Outpatient Medications Prior to Visit  Medication Sig Dispense Refill   allopurinol (ZYLOPRIM) 100 MG tablet Take 100 mg by mouth 2 (two) times daily.      Calcium Carb-Cholecalciferol (CALCIUM 600 + D PO) Take 1 tablet by mouth 2 (two) times daily.     carvedilol (COREG) 6.25 MG tablet TAKE 1 TABLET BY MOUTH TWICE DAILY WITH A MEAL. 60 tablet 11   Cholecalciferol (VITAMIN D3) 125 MCG (5000 UT) TABS Take 1 tablet by mouth in the morning and at bedtime.     FARXIGA 10 MG TABS tablet TAKE 1 TABLET DAILY BEFORE BREAKFAST. 30 tablet 11   furosemide (LASIX) 20 MG tablet Take 1 tablet (20 mg total) by mouth as needed for fluid or edema. 30 tablet 3   levothyroxine (SYNTHROID) 50 MCG tablet Take 50 mcg by mouth daily.     Magnesium Oxide 400 MG CAPS Take 1 capsule (400 mg total) by mouth in the morning and at bedtime. 30 capsule 6   pantoprazole (PROTONIX) 40 MG tablet Take 1 tablet (40 mg total) by mouth daily. 30 tablet 1   potassium chloride SA (KLOR-CON M) 20 MEQ tablet TAKE 1 TABLET BY MOUTH TWICE DAILY 180 tablet 3   rosuvastatin (CRESTOR) 10 MG tablet TAKE 1 TABLET BY MOUTH ONCE A DAY. 90 tablet 3   sacubitril-valsartan (ENTRESTO) 97-103 MG Take 1 tablet by mouth 2 (two) times daily. 180 tablet 3   spironolactone (ALDACTONE) 25 MG tablet TAKE 1 TABLET BY MOUTH DAILY. 90 tablet 3   colesevelam (WELCHOL) 625 MG tablet TAKE 2 TABLETS BY MOUTH DAILY BEFORE LUNCH. TAKE 2 HOURS BEFORE OR AFTER OTHER MEDICATIONS. (Patient not taking: Reported on 03/24/2022) 60 tablet 0   hydrocortisone (ANUSOL-HC) 2.5 % rectal cream Place 1 application. rectally 2 (two) times daily as needed for hemorrhoids. (Patient not taking: Reported on 03/24/2022) 60 g 1   No facility-administered medications prior to visit.  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, industrial/product Tobacco Use   Smoking status: Former    Packs/day: 1.50    Years: 3.00    Total pack years: 4.50    Types: Cigarettes    Quit date: 04/07/1980    Years since quitting: 42.0   Smokeless tobacco: Never  Vaping Use   Vaping Use: Never used  Substance and Sexual Activity   Alcohol use: No   Drug use: No    Comment: quit in 1999, marijuana   Sexual activity: Not on file  Other Topics Concern   Not on file  Social History Narrative   Not on file   Social Determinants of Health   Financial Resource Strain: Not on file  Food Insecurity: Not on file  Transportation Needs: Not on file  Physical Activity: Not on file  Stress: Not on file  Social Connections: Not on file     Family History:  The patient's family history is notable for diabetes, hyperlipidemia, lung cancer, stroke, and arthritis.  ROS General: Negative; No fevers, chills, or night sweats;  HEENT: Negative; No changes in vision or hearing, sinus congestion, difficulty swallowing Pulmonary: Negative; No cough, wheezing, shortness of breath, hemoptysis Cardiovascular: History of nonischemic heart cardiomyopathy, hypertension; exertional dyspnea, occasional swelling, hyperlipidemia GI: Positive for GERD GU: Negative; No dysuria, hematuria, or difficulty voiding Musculoskeletal: Negative; no myalgias, joint pain, or weakness Hematologic/Oncology: Negative; no easy bruising, bleeding Endocrine: Negative; no heat/cold intolerance; no diabetes Neuro: Negative; no changes in balance, headaches Skin: Negative; No rashes or skin lesions Psychiatric: Negative; No behavioral problems, depression Sleep:  See HPI Other comprehensive 14 point  system review is negative.   PHYSICAL EXAM:   VS:  BP 132/70   Pulse 60   Ht _0  (1.778 m)   Wt 280 lb (127 kg)   SpO2 98%   BMI 40.18 kg/m     Repeat blood pressure by me was 104/68  Wt Readings from Last 3 Encounters:  03/24/22 280 lb (127 kg)  10/17/21 266 lb 6.4 oz (120.8 kg)  07/30/21 268 lb (121.6 kg)    General: Alert, oriented, no distress.  Skin: normal turgor, no rashes, warm and dry HEENT: Normocephalic, atraumatic. Pupils equal round and reactive to light; sclera anicteric; extraocular muscles intact;  Nose without nasal septal hypertrophy Mouth/Parynx benign; Mallinpatti scale 3/4 Neck: No JVD, no carotid bruits; normal carotid upstroke Lungs: clear to ausculatation and percussion; no wheezing or rales Chest wall: without tenderness to palpitation Heart: PMI not displaced, RRR, s1 s2 normal, 1/6 systolic murmur, no diastolic murmur, no rubs, gallops, thrills, or heaves Abdomen: soft, nontender; no hepatosplenomehaly, BS+; abdominal aorta nontender and not dilated by palpation. Back: no CVA tenderness Pulses 2+ Musculoskeletal: full range of motion, normal strength, no joint deformities Extremities: no clubbing cyanosis or edema, Homan's sign negative  Neurologic: grossly nonfocal; Cranial nerves grossly wnl Psychologic: Normal mood and affect    Studies/Labs Reviewed:   March 24, 2022 ECG (independently read by me): NSR at 60, LBBB  June 18, 2020 ECG (independently read by me): Sinus bradycardia at 52; LBBB  08/04/2017 ECG (independently read by me): Sinus bradycardia 55  bpm.  Left bundle branch block with repolarization changes.  No ectopy.  ECG (independently read by me): Sinus bradycardia 57 bpm.  First-degree AV block with PR interval at 200 ms.  Left bundle branch block.  QTc interval 463 ms.  Recent Labs:    Latest Ref Rng & Units 10/17/2021    9:42 AM 06/27/2021    2:16 PM 04/22/2021    9:48 AM  BMP  Glucose 70 - 99 mg/dL 99  88  104   BUN  8 - 23 mg/dL _0 Creatinine 0.61 - 1.24 mg/dL 1.22  1.50  1.27   Sodium 135 - 145 mmol/L 135  134  133   Potassium 3.5 - 5.1 mmol/L 4.5  4.5  4.5   Chloride 98 - 111 mmol/L 104  99  97   CO2 22 - 32 mmol/L _1 Calcium 8.9 - 10.3 mg/dL 9.0  9.9  9.7         Latest Ref Rng & Units 06/27/2021    2:16 PM 12/28/2020    4:20 PM 06/07/2019   10:55 AM  Hepatic Function  Total Protein 6.0 - 8.3 g/dL 7.6  7.6  7.2   Albumin 3.5 - 5.2 g/dL 4.7  4.1  4.1   AST 0 - 37 U/L 16  18  33   ALT 0 - 53 U/L 14  27  42   Alk Phosphatase 39 - 117 U/L 49  43  45   Total Bilirubin 0.2 - 1.2 mg/dL 1.0  0.2  0.6        Latest Ref Rng & Units 06/27/2021    2:16 PM 01/03/2021    5:00 AM 01/02/2021    5:01 AM  CBC  WBC 4.0 - 10.5 K/uL 7.2  7.8  6.6   Hemoglobin 13.0 - 17.0 g/dL 12.9  9.3  9.3   Hematocrit 39.0 - 52.0 % 38.3  28.0  27.4   Platelets 150.0 - 400.0 K/uL 219.0  155  138    Lab Results  Component Value Date   MCV 93.1 06/27/2021   MCV 98.2 01/03/2021   MCV 96.8 01/02/2021   Lab Results  Component Value Date   TSH 1.02 06/27/2021   Lab Results  Component Value Date   HGBA1C 6.2 (H) 12/28/2020     BNP    Component Value Date/Time   BNP 18.7 11/20/2020 0923    ProBNP    Component Value Date/Time   PROBNP <30.0 03/12/2009 1249     Lipid Panel     Component Value Date/Time   CHOL 98 10/17/2021 0942   TRIG 95 10/17/2021 0942   HDL 30 (L) 10/17/2021 0942   CHOLHDL 3.3 10/17/2021 0942   VLDL 19 10/17/2021 0942   LDLCALC 49 10/17/2021 0942   LDLDIRECT 161.2 10/19/2008 0946     RADIOLOGY: No results found.   Additional studies/ records that were reviewed today include:  I review the office records of Dr. Charlesetta Shanks, the patient's sleep study with CPAP titration, and have obtain a download from March 25 through 07/28/2016.  I reviewed the most recent records of Dr. [name].   New download from May 16, 2020 through June 14, 2020 was  obtained.  ASSESSMENT:    1. OSA on CPAP   2. NICM (nonischemic cardiomyopathy) ([place])   3. Chronic systolic CHF (congestive heart failure) ([place])   4. LBBB (left bundle branch block)  5. Morbid obesity [facility])     PLAN:  Mr. Honest Vanleer is a 61 year old male who has a history of a nonischemic cardiomyopathy with an ejection fraction initially at 30-35% which had decreased to 25 to 30% in May 2019.  He is now followed by Dr. [name] is on advanced heart failure medications including carvedilol 25 mg twice a day, Entresto 97/103 mg twice a day, Farxiga 10 mg, furosemide 20 mg as needed and spironolactone 25 mg daily.  He has been on CPAP therapy since his set up date on June 21, 2015.  With your upgrade to [employer], his machine is no longer wireless.  He has consistently been compliant.  We obtained a download today from his machine which she brought to the office and usage is 100% with average use of 7 hours and 44 minutes.  At a 14 cm set pressure AHI remains excellent at 2.0.  There is no mask leak.  He uses a fullface mask.  His blood pressure today is stable and he is on guideline directed medical therapy for his nonischemic cardiomyopathy.  He continues to be morbidly obese with a BMI of 40.18.  We will place an order for him to receive a new ResMed AirSense 11 AutoSet unit.  I will need to see him within 90 days of him receiving his new machine for compliance.  He continues to be on rosuvastatin for hyperlipidemia and levothyroxine for hypothyroidism.  I will see him in  4 months for follow-up evaluation.   Medication Adjustments/Labs and Tests Ordered: Current medicines are reviewed at length with the patient today.  Concerns regarding medicines are outlined above.  Medication changes, Labs and Tests ordered today are listed in the Patient Instructions below. Patient Instructions  Medication Instructions:  Your physician recommends that you continue on your current medications as  directed. Please refer to the Current Medication list given to you today.  *If you need a refill on your cardiac medications before your next appointment, please call your pharmacy*   Lab Work: NONE ordered at this time of appointment  \you have labs (blood work) drawn today and your tests are completely normal, you will receive your results only by: [place] (if you have MyChart) OR A paper copy in the mail If you have any lab test that is abnormal or we need to change your treatment, we will call you to review the results.   Testing/Procedures: NONE ordered at this time of appointment    Follow-Up: At [facility], you and your health needs are our priority.  As part of our continuing mission to provide you with exceptional heart care, we have created designated Provider Care Teams.  These Care Teams include your primary Cardiologist (physician) and Advanced Practice Providers (APPs -  Physician Assistants and Nurse Practitioners) who all work together to provide you with the care you need, when you need it.  We recommend signing up for the patient portal called "MyChart".  Sign up information is provided on this After Visit Summary.  MyChart is used to connect with patients for Virtual Visits (Telemedicine).  Patients are able to view lab/test results, encounter notes, upcoming appointments, etc.  Non-urgent messages can be sent to your provider as well.   To learn more about what you can do with MyChart, go to [email or web address].    Your next appointment:   4 month(s) Sleep Clinic The format for your next appointment:   In Person  Provider:   Dr.  Claiborne Billings     Other Instructions New Machine will be order for pt  Important Information About Sugar        Time spent 25 minutes Signed, Shelva Majestic, MD  03/28/2022 10:58 AM    [facility] [address] [place], [place], [place]  [address] Phone: [phone]

## 2022-03-24 NOTE — Patient Instructions (Addendum)
Medication Instructions:  Your physician recommends that you continue on your current medications as directed. Please refer to the Current Medication list given to you today.  *If you need a refill on your cardiac medications before your next appointment, please call your pharmacy*   Lab Work: NONE ordered at this time of appointment  \ If you have labs (blood work) drawn today and your tests are completely normal, you will receive your results only by: [place] (if you have MyChart) OR A paper copy in the mail If you have any lab test that is abnormal or we need to change your treatment, we will call you to review the results.   Testing/Procedures: NONE ordered at this time of appointment    Follow-Up: At [facility], you and your health needs are our priority.  As part of our continuing mission to provide you with exceptional heart care, we have created designated Provider Care Teams.  These Care Teams include your primary Cardiologist (physician) and Advanced Practice Providers (APPs -  Physician Assistants and Nurse Practitioners) who all work together to provide you with the care you need, when you need it.  We recommend signing up for the patient portal called "MyChart".  Sign up information is provided on this After Visit Summary.  MyChart is used to connect with patients for Virtual Visits (Telemedicine).  Patients are able to view lab/test results, encounter notes, upcoming appointments, etc.  Non-urgent messages can be sent to your provider as well.   To learn more about what you can do with MyChart, go to [email or web address].    Your next appointment:   4 month(s) Sleep Clinic The format for your next appointment:   In Person  Provider:   Dr. Claiborne Billings     Other Instructions New Machine will be order for pt  Important Information About Sugar

## 2022-04-09 ENCOUNTER — Telehealth: Payer: Self-pay | Admitting: *Deleted

## 2022-04-09 NOTE — Telephone Encounter (Signed)
Order for replacement CPAP machine sent to [facility] via [place].

## 2022-04-09 NOTE — Telephone Encounter (Signed)
-----   Message from Derrick Ravel, [place] sent at 03/24/2022  9:41 AM EST ----- Marykay Lex, Mr. Egnor will need a new machine ordered. Resmed 11 per Dr. Claiborne Billings.

## 2022-04-10 DIAGNOSIS — E538 Deficiency of other specified B group vitamins: Secondary | ICD-10-CM | POA: Diagnosis not present

## 2022-04-18 ENCOUNTER — Other Ambulatory Visit (HOSPITAL_COMMUNITY): Payer: Self-pay

## 2022-04-18 DIAGNOSIS — G4733 Obstructive sleep apnea (adult) (pediatric): Secondary | ICD-10-CM | POA: Diagnosis not present

## 2022-04-21 ENCOUNTER — Other Ambulatory Visit (HOSPITAL_COMMUNITY): Payer: Self-pay | Admitting: *Deleted

## 2022-04-21 ENCOUNTER — Telehealth (HOSPITAL_COMMUNITY): Payer: Self-pay

## 2022-04-21 ENCOUNTER — Other Ambulatory Visit (HOSPITAL_COMMUNITY): Payer: Self-pay

## 2022-04-21 MED ORDER — EMPAGLIFLOZIN 10 MG PO TABS: 10.0000 mg | ORAL_TABLET | Freq: Every day | ORAL | 11 refills | Status: DC

## 2022-04-21 NOTE — Telephone Encounter (Signed)
Advanced Heart Failure Patient Advocate Encounter  Returned patient call regarding the cost of [place]. With insurance and savings card this is still unaffordable to the patient. Review of patient coverage shows that [name] is not covered under this plan, and Jardiance requires prior auth.  PA submitted and APPROVED. Key [id number] Effective: 04/18/22 - 04/18/23  New prescription for Jardiance sent to pharmacy with savings card information on 04/21/22. Spoke with pharmacy staff to confirm $10 copay. Left message with patient regarding the update to medication list.  Clista Bernhardt, CPhT Rx Patient Advocate Phone: [phone]

## 2022-04-23 ENCOUNTER — Encounter (HOSPITAL_COMMUNITY): Payer: Self-pay | Admitting: Cardiology

## 2022-04-23 ENCOUNTER — Ambulatory Visit (HOSPITAL_COMMUNITY)
Admission: RE | Admit: 2022-04-23 | Discharge: 2022-04-23 | Disposition: A | Discharge status: Home / Self Care | Payer: 59 | Source: Ambulatory Visit | Attending: Cardiology | Admitting: Cardiology

## 2022-04-23 VITALS — BP 100/60 | HR 54 | Wt 276.6 lb

## 2022-04-23 DIAGNOSIS — E785 Hyperlipidemia, unspecified: Secondary | ICD-10-CM | POA: Insufficient documentation

## 2022-04-23 DIAGNOSIS — I110 Hypertensive heart disease with heart failure: Secondary | ICD-10-CM | POA: Insufficient documentation

## 2022-04-23 DIAGNOSIS — M791 Myalgia, unspecified site: Secondary | ICD-10-CM | POA: Insufficient documentation

## 2022-04-23 DIAGNOSIS — I428 Other cardiomyopathies: Secondary | ICD-10-CM | POA: Diagnosis not present

## 2022-04-23 DIAGNOSIS — Z6839 Body mass index (BMI) 39.0-39.9, adult: Secondary | ICD-10-CM | POA: Insufficient documentation

## 2022-04-23 DIAGNOSIS — I5022 Chronic systolic (congestive) heart failure: Secondary | ICD-10-CM | POA: Diagnosis not present

## 2022-04-23 DIAGNOSIS — Z79899 Other long term (current) drug therapy: Secondary | ICD-10-CM | POA: Insufficient documentation

## 2022-04-23 DIAGNOSIS — E669 Obesity, unspecified: Secondary | ICD-10-CM | POA: Insufficient documentation

## 2022-04-23 DIAGNOSIS — G4733 Obstructive sleep apnea (adult) (pediatric): Secondary | ICD-10-CM | POA: Diagnosis not present

## 2022-04-23 DIAGNOSIS — I447 Left bundle-branch block, unspecified: Secondary | ICD-10-CM | POA: Diagnosis not present

## 2022-04-23 LAB — BASIC METABOLIC PANEL
Anion gap: 9 (ref 5–15)
BUN: 13 mg/dL (ref 8–23)
CO2: 25 mmol/L (ref 22–32)
Calcium: 9.1 mg/dL (ref 8.9–10.3)
Chloride: 100 mmol/L (ref 98–111)
Creatinine, Ser: 1.36 mg/dL — ABNORMAL HIGH (ref 0.61–1.24)
GFR, Estimated: 59 mL/min — ABNORMAL LOW (ref >60)
Glucose, Bld: 102 mg/dL — ABNORMAL HIGH (ref 70–99)
Potassium: 4.4 mmol/L (ref 3.5–5.1)
Sodium: 134 mmol/L — ABNORMAL LOW (ref 135–145)

## 2022-04-23 NOTE — Progress Notes (Signed)
Advanced Heart Failure Clinic Note   PCP: Redmond School, MD PCP-Cardiologist: Minus Breeding, MD  HF Cardiology: Dr. [name]  HPI:  Nicholas Gray is a 62 y.o. male with chronic systolic CHF, NICM (Normal coronaries 2010), HTN, LBBB, Obesity, and OSA on CPAP.  He has been known to have a cardiomyopathy for the last 10 years or so.   Seen in [facility] office 08/13/17 and was overall feeling well. No new symptoms.  Echo 08/27/17 LVEF 25-30%, Grade 1 DD, Mild MR, Mild LAE, mild RV dilation, PA peak pressure 34 mm Hg.  Referred by Dr. Percival Spanish to CHF team for further evaluation and treatment of CHF.    Pt admitted 5/30 - 09/13/2017 with Chest/upper abdominal discomfort and pain. CT scan and HIDA consistent with acute cholecystitis. Underwent laproscopic chole on 09/07/17 and found to have gangrenous GB. 09/09/17 repeat HIDA scan showed bile leak. ERCP 09/11/17, but patient had failed stent placement. Considered for IR for drain placement, but decided on conservative management.  He was admitted again with RUQ pain in 7/19, had ERCP with bile leak and choledocholithiasis noted.  He had stone removal and stent placed.   LHC/RHC in 7/19 showed no significant CAD, optimized filling pressures and preserved cardiac output. Cardiac MRI in 11/19 showed improvement in LV EF to 51% with mildly decreased RV systolic function, no LGE.   Echo in 5/21 showed EF in the range of 40% though it is a technically difficult study.  There is septal-lateral dyssynchrony. Cardiac MRI in 6/21 showed LV EF 46% with septal-lateral dyssynchrony, RV EF 51%, no LGE.  Echo in 8/22 showed EF 45% with mild diffuse hypokinesis and septal-lateral dyssynchrony, normal RV.   Patient started Ozempic for weight loss.  He lost weight but developed profuse diarrhea and actually ended up in the hospital with pneumatosis intestinalis.  He is now off Ozempic.   Echo in 7/23 showed EF 45%, septal-lateral dyssynchrony, normal RV systolic function with  mild RV enlargement, IVC normal.    He returns today for followup of CHF.  Weight is up 10 lbs.  He is doing well symptomatically.  No dyspnea walking on flat ground.  He does get short of breath walking up hills when he goes hiking.  No lightheadedness.  No chest pain.  He is using CPAP.    ECG (personally reviewed): NSR, LBBB 156 msec  Labs (6/19): K 4.2, creatinine 1.14 Labs (8/19): K 3.9, creatinine 1.14, normal LFTs Labs (01/05/2018): K 4.5 creatinine 1.23  Labs (11/19): K 3.9, creatinine 1.16 Labs (1/20): K 3.9, creatinine 1.13 Labs (3/21): K 4.2, creatinine 1.28, hgb 13.7, LDL 57, HDL 28 Labs (5/21): K 4.2, creatinine 1.33 Labs (11/21): K 4.1, creatinine 1.43 Labs (1/22): LDL 48 Labs (4/22): K 3.8, creatinine 1.16 Labs (10/22): LDL 45 Labs (12/22): Mg 1.8, K 3.9, creatinine 1.02 Labs (3/23): K 4.5, creatinine 1.5 Labs (7/23): LDL 49, K 4.5, creatinine 1.22  Social History: Lives in [place], [place]. 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 Denies heavy ETOH use.  Family history: He denies immediate family history of CHF, but thinks his maternal grandparents may have had "heart problems"   Review of systems complete and found to be negative unless listed in HPI.    Past Medical History: 1. Chronic systolic CHF: Suspected nonischemic cardiomyopathy (normal coronaries 2010).  - Echo 2012 with EF 30% - Echo 12/15 with EF 40-45% - Echo 2017 with EF 35-40% - Echo 08/27/17  LVEF 25-30%, Grade 1 DD, Mild MR, Mild LAE, mild RV dilation, PA peak pressure 34 mm Hg.  - LHC/RHC (7/19): No significant coronary disease. RA 7, PA 39/12 mean 25, PCWP mean 11, CI 3.68, PVR 1.65 WU.  - Cardiac MRI (11/19): LV EF 51% with septal-lateral dyssynchrony, mild RV dilation with RV EF 39%, no LGE.  - Echo (5/21): Technically difficult, EF around 40% with diffuse hypokinesis and septal-lateral dyssynchrony, normal RV.  - Cardiac MRI (6/21): LV EF 46% with septal-lateral  dyssynchrony, RV EF 51%, no LGE.  - Echo (8/22): EF 45% with mild diffuse hypokinesis and septal-lateral dyssynchrony, normal RV.  - Echo (7/23): EF 45%, septal-lateral dyssynchrony, normal RV systolic function with mild RV enlargement, IVC normal.   2. HTN 3. LBBB, chronic 4. Obesity Body mass index is 39.69 kg/m. 5. OSA: Reports compliance with his CPAP.  6. GERD 7. Cholecystitis: Underwent laproscopic chole on 09/07/17 and found to have gangrenous GB.  - 09/09/17 repeat HIDA scan showed bile leak.  - ERCP 09/11/17, but patient had failed stent placement. - ERCP 7/19 with sphincterotomy, stone removal, stent placement.   Current Outpatient Medications  Medication Sig Dispense Refill   allopurinol (ZYLOPRIM) 100 MG tablet Take 100 mg by mouth 2 (two) times daily.      Calcium Carb-Cholecalciferol (CALCIUM 600 + D PO) Take 1 tablet by mouth 2 (two) times daily.     carvedilol (COREG) 6.25 MG tablet TAKE 1 TABLET BY MOUTH TWICE DAILY WITH A MEAL. 60 tablet 11   Cholecalciferol (VITAMIN D3) 125 MCG (5000 UT) TABS Take 1 tablet by mouth in the morning and at bedtime.     empagliflozin (JARDIANCE) 10 MG TABS tablet Take 1 tablet (10 mg total) by mouth daily before breakfast. 30 tablet 11   furosemide (LASIX) 20 MG tablet Take 1 tablet (20 mg total) by mouth as needed for fluid or edema. 30 tablet 3   hydrocortisone (ANUSOL-HC) 2.5 % rectal cream Place 1 application. rectally 2 (two) times daily as needed for hemorrhoids. 60 g 1   levothyroxine (SYNTHROID) 50 MCG tablet Take 50 mcg by mouth daily.     Magnesium Oxide 400 MG CAPS Take 1 capsule (400 mg total) by mouth in the morning and at bedtime. 30 capsule 6   pantoprazole (PROTONIX) 40 MG tablet Take 1 tablet (40 mg total) by mouth daily. 30 tablet 1   potassium chloride SA (KLOR-CON M) 20 MEQ tablet TAKE 1 TABLET BY MOUTH TWICE DAILY 180 tablet 3   rosuvastatin (CRESTOR) 10 MG tablet TAKE 1 TABLET BY MOUTH ONCE A DAY. 90 tablet 3    sacubitril-valsartan (ENTRESTO) 97-103 MG Take 1 tablet by mouth 2 (two) times daily. 180 tablet 3   spironolactone (ALDACTONE) 25 MG tablet TAKE 1 TABLET BY MOUTH DAILY. 90 tablet 3   No current facility-administered medications for this encounter.   Allergies  Allergen Reactions   Ozempic (0.25 Or 0.5 Mg-Dose) [Semaglutide(0.25 Or 0.'5mg'$ -Dos)]     GI upset on 0.'25mg'$  dose    Vitals:   04/23/22 0858  BP: 100/60  Pulse: (!) 54  SpO2: 96%  Weight: 125.5 kg (276 lb 9.6 oz)   Wt Readings from Last 3 Encounters:  04/23/22 125.5 kg (276 lb 9.6 oz)  03/24/22 127 kg (280 lb)  10/17/21 120.8 kg (266 lb 6.4 oz)    PHYSICAL EXAM: General: NAD Neck: No JVD, no thyromegaly or thyroid nodule.  Lungs: Clear to auscultation bilaterally with normal respiratory effort.  CV: Nondisplaced PMI.  Heart regular S1/S2, no S3/S4, no murmur.  No peripheral edema.  No carotid bruit.  Normal pedal pulses.  Abdomen: Soft, nontender, no hepatosplenomegaly, no distention.  Skin: Intact without lesions or rashes.  Neurologic: Alert and oriented x 3.  Psych: Normal affect. Extremities: No clubbing or cyanosis.  HEENT: Normal.   ASSESSMENT & PLAN:  1. Chronic systolic CHF: Nonischemic cardiomyopathy by coronary angiography in 2010 and 7/19.  He has had a cardiomyopathy for at least the last 10 years.  Echo in 5/19 showed a fall in EF to the 25-30% range.  He does not have an ICD as prior echoes had shown EF in the 40% range.  It is possible that he has a LBBB cardiomyopathy.  Prior viral myocarditis is also a possibility. No significant FH of CHF and no heavy ETOH/substance abuse.  Cardiac MRI in 6/21 showed LV EF 46% with septal-lateral dyssynchrony, RV EF 51%, no LGE.  Echo in 8/22 and again in 7/23 showed EF 45% with septal-lateral dyssynchrony.  NYHA class I-II, he is not volume overloaded on exam.  - He does not appear to need standing Lasix.  - Continue current Entresto, spironolactone, and dapagliflozin  (all at goal).  - Continue Coreg 6.25 mg bid, will not increase with mild bradycardia.  - BMET today.    - EF has been out of CRT-D range.    2. HTN: BP controlled.  3. LBBB: Chronic, unchanged.  4. Obesity: Body mass index is 39.69 kg/m.  - Encouraged weight loss by diet/exercise.  - He did not tolerate semaglutide witih profuse diarrhea.  5. OSA: Continue CPAP.  6. Hyperlipidemia: ?Myalgias related to simvastatin.  - Continue Crestor, good lipids in 7/23.   Followup in 6 months APP but will get BMET in 3 months.    [name] 04/23/2022

## 2022-04-23 NOTE — Patient Instructions (Signed)
There has been no changes to your medications.  Labs done today, your results will be available in MyChart, we will contact you for abnormal readings.  Your physician recommends that you schedule a follow-up appointment in: 6 months (July 2024) **please call the office in May to arrange your follow up appointment. **  If you have any questions or concerns before your next appointment please send [place] a message through [place] or call our office at [phone].    TO LEAVE A MESSAGE FOR THE NURSE SELECT OPTION 2, PLEASE LEAVE A MESSAGE INCLUDING: YOUR NAME DATE OF BIRTH CALL BACK NUMBER REASON FOR CALL**this is important as we prioritize the call backs  YOU WILL RECEIVE A CALL BACK THE SAME DAY AS LONG AS YOU CALL BEFORE 4:00 PM  At the [facility], you and your health needs are our priority. As part of our continuing mission to provide you with exceptional heart care, we have created designated Provider Care Teams. These Care Teams include your primary Cardiologist (physician) and Advanced Practice Providers (APPs- Physician Assistants and Nurse Practitioners) who all work together to provide you with the care you need, when you need it.   You may see any of the following providers on your designated Care Team at your next follow up: Dr Glori Bickers Dr [name] Dr. Roxana Hires, NP Lyda Jester, [place] [facility] [place], [place] Forestine Na, NP Audry Riles, PharmD   Please be sure to bring in all your medications bottles to every appointment.

## 2022-05-09 DIAGNOSIS — E538 Deficiency of other specified B group vitamins: Secondary | ICD-10-CM | POA: Diagnosis not present

## 2022-05-19 DIAGNOSIS — G4733 Obstructive sleep apnea (adult) (pediatric): Secondary | ICD-10-CM | POA: Diagnosis not present

## 2022-06-06 DIAGNOSIS — E538 Deficiency of other specified B group vitamins: Secondary | ICD-10-CM | POA: Diagnosis not present

## 2022-06-17 DIAGNOSIS — G4733 Obstructive sleep apnea (adult) (pediatric): Secondary | ICD-10-CM | POA: Diagnosis not present

## 2022-06-23 ENCOUNTER — Other Ambulatory Visit (HOSPITAL_COMMUNITY): Payer: Self-pay

## 2022-06-23 ENCOUNTER — Telehealth (HOSPITAL_COMMUNITY): Payer: Self-pay

## 2022-06-23 NOTE — Telephone Encounter (Signed)
Advanced Heart Failure Patient [facility] is still experiencing technical difficulties. Samples provided to patient until this can be resolved.  Medication Samples have been left at registration desk for patient pick up. Drug name: Delene Loll 49-51MG  Qty: 4x 28 ct package LOT: [id number] Exp.: 09/2023 SIG: Take 2 tablets by mouth twice daily   The patient has been instructed regarding the correct time, dose, and frequency of taking this medication, including desired effects and most common side effects.   Clista Bernhardt, CPhT Rx Patient Advocate Phone: [phone]

## 2022-06-26 DIAGNOSIS — I428 Other cardiomyopathies: Secondary | ICD-10-CM | POA: Diagnosis not present

## 2022-06-26 DIAGNOSIS — R1030 Lower abdominal pain, unspecified: Secondary | ICD-10-CM | POA: Diagnosis not present

## 2022-06-26 DIAGNOSIS — I10 Essential (primary) hypertension: Secondary | ICD-10-CM | POA: Diagnosis not present

## 2022-06-26 DIAGNOSIS — K529 Noninfective gastroenteritis and colitis, unspecified: Secondary | ICD-10-CM | POA: Diagnosis not present

## 2022-06-26 DIAGNOSIS — I700 Atherosclerosis of aorta: Secondary | ICD-10-CM | POA: Diagnosis not present

## 2022-06-26 DIAGNOSIS — Z6839 Body mass index (BMI) 39.0-39.9, adult: Secondary | ICD-10-CM | POA: Diagnosis not present

## 2022-06-26 DIAGNOSIS — E6609 Other obesity due to excess calories: Secondary | ICD-10-CM | POA: Diagnosis not present

## 2022-06-26 DIAGNOSIS — I5022 Chronic systolic (congestive) heart failure: Secondary | ICD-10-CM | POA: Diagnosis not present

## 2022-07-07 DIAGNOSIS — E538 Deficiency of other specified B group vitamins: Secondary | ICD-10-CM | POA: Diagnosis not present

## 2022-07-18 DIAGNOSIS — G4733 Obstructive sleep apnea (adult) (pediatric): Secondary | ICD-10-CM | POA: Diagnosis not present

## 2022-07-21 ENCOUNTER — Ambulatory Visit: Payer: 59 | Attending: Cardiovascular Disease | Admitting: Cardiovascular Disease

## 2022-07-21 ENCOUNTER — Encounter: Payer: Self-pay | Admitting: Cardiovascular Disease

## 2022-07-21 VITALS — BP 110/60 | HR 66 | Ht 70.0 in | Wt 276.0 lb

## 2022-07-21 DIAGNOSIS — I428 Other cardiomyopathies: Secondary | ICD-10-CM

## 2022-07-21 DIAGNOSIS — G4733 Obstructive sleep apnea (adult) (pediatric): Secondary | ICD-10-CM

## 2022-07-21 DIAGNOSIS — I447 Left bundle-branch block, unspecified: Secondary | ICD-10-CM

## 2022-07-21 NOTE — Patient Instructions (Signed)
Medication Instructions:  None ordered   Lab Work: None ordered   Testing/Procedures: None ordered   Follow-Up: At [facility], you and your health needs are our priority.  As part of our continuing mission to provide you with exceptional heart care, we have created designated Provider Care Teams.  These Care Teams include your primary Cardiologist (physician) and Advanced Practice Providers (APPs -  Physician Assistants and Nurse Practitioners) who all work together to provide you with the care you need, when you need it.  We recommend signing up for the patient portal called "MyChart".  Sign up information is provided on this After Visit Summary.  MyChart is used to connect with patients for Virtual Visits (Telemedicine).  Patients are able to view lab/test results, encounter notes, upcoming appointments, etc.  Non-urgent messages can be sent to your provider as well.   To learn more about what you can do with MyChart, go to [email or web address].    Your next appointment:  1 year    Call in Jan to schedule April appointment     Provider:  [facility]

## 2022-07-21 NOTE — Progress Notes (Signed)
Cardiology Office Note    Date:  07/26/2022   ID:  Nicholas Gray, DOB 07/18/1960, MRN [id number]  PCP:  Elfredia Nevins, MD  Cardiologist:  Nicki Guadalajara, MD (sleep); Dr. Antoine Poche, Dr. Shirlee Latch  5 month F/U Sleep Clinic evaluation.   History of Present Illness:  Nicholas Gray is a 62 y.o. male who is now  followed by Dr. Shirlee Latch for his cardiology care.  I last saw him in December 2023.  He presents for a follow-up sleep evaluation.  Nicholas Gray has a history of a nonischemic cardiomyopathy with an ejection fraction of 30-40% in 2010, and his most recent echo in September 2017 showed an EF of 35-40% with grade 1 diastolic dysfunction and moderate pulmonary hypertension.  He admits to recent increasing shortness of breath with uphill walking and notes mild ankle swelling.  He was initially referred for a sleep study in 2010 at [facility].  He had seen Dr. Felipa Eth  on one occasion in February 2011.  He initially tried therapy, but abandoned this shortly thereafter, and never had follow-up evaluation.   He was ultimately referred back for a split night sleep study on 05/13/2015 due to symptoms of snoring, witnessed apnea, and nonrestorative sleep.  This demonstrated severe sleep apnea with an AHI of 57.4 per hour.  AHI during REM sleep was 49.4/h and with supine sleep was 78.1/h.  He had significant oxygen desaturation to 73% and time below 88% was 34.6 minutes.  CPAP titration was initiated.  During a split-night study, and a 12 cm water pressure was recommended. [facility] is his DME.  For some reason, after initiating CPAP therapy one year ago, he never presented to the sleep clinic for initial assessment.  However, since initiating CPAP therapy, he is sleeping better.  He is unaware of any breakthrough snoring.  I saw him for initial sleep evaluation and April 2018.  A download was obtained from 06/29/2016 through 07/28/2016.  He was meeting compliance standards with 100%  usage days and usage greater than 4 hours.  He is averaging 7 hours and 43 minutes of CPAP use per night.  AHI at 12 cwp is 4.4.  He has an air-fit F10, size small mask.  Further review of his download indicates that there were at least 3 nights where his AHI was >5/h with a peak up to 8.8.  Of note, he admits to a 30 pound weight gain over the past 6 months.  He is unaware of any breakthrough snoring.  His sleep is more restorative.  He is unaware of any parasomnias, or nocturnal palpitations. He denies daytime sleepiness, hypersomnolence, bruxism, restless legs, hypnogognic hallucinations, no cataplexy.  An Epworth Sleepiness Scale was recalculated in the office today which arteries against residual daytime sleepiness as shown below.  Epworth Sleepiness Scale: Situation   Chance of Dozing/Sleeping (0 = never , 1 = slight chance , 2 = moderate chance , 3 = high chance )   sitting and reading 2   watching TV 1   sitting inactive in a public place 0   being a passenger in a motor vehicle for an hour or more 0   lying down in the afternoon 3   sitting and talking to someone 0   sitting quietly after lunch (no alcohol) 1   while stopped for a few minutes in traffic as the driver 0   Total Score  7   I saw him in April 2019,  Nicholas Gray was compliant with CPAP therapy.  He remained active and was working at least 6 days/week.  He has more energy since initiating CPAP.  He typically works 11 hours/day.  He is up at 4 AM and works between 6 AM and 5 PM.  He typically goes to bed at 8 PM and is asleep by 9 PM.  On Saturday he works from 6 AM until noon.  New download was obtained in the office from July 04, 2017 through August 02, 2017 showed 100% compliance with average usage 7 hours and 43 minutes of sleep per night.  His CPAP machine was set at 13 cm, AHI was 4.1.  He denied any residual daytime sleepiness.   He sees Dr. Shirlee Latch for advanced heart failure clinic care and has not seen Dr. Antoine Poche.  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  When I saw him on June 18, 2020 he was using CPAP with 100% compliance and denied residual daytime sleepiness.  Epworth scale endorsed at 5. A download from May 16, 2020 through June 14, 2020 which confirmsed100% compliance and average use at 8 hours and 17 minutes.  At 13 cm water pressure AHI is 3.3.  [facility] had [facility] which transition to [place].  However he has not had gotten any supplies in some time.  He uses a full facemask.  I reviewed his app today.  There are some nights where his AHI is slightly above 5.  He typically goes to bed at 10 PM and wakes up at 6 AM.  He denied any residual snoring, daytime sleepiness, bruxism, hypnagogic hallucinations or cataplectic events.    I last saw him on March 24, 2022 at which time he was continuing to use CPAP.   His blood pressure at home has been stable in the 100-110 systolic range on his regimen of carvedilol 6.25 mg twice a day, Entresto 97/103 mg twice a day, Farxiga 10 mg, furosemide 20 mg as needed in addition to spironolactone 25 mg daily.  He is on levothyroxine for hypothyroidism and pantoprazole for GERD.  His CPAP unit is from June 21, 2015.  He brought his machine with him today so we could obtain a download since his machine is no longer wireless with upgrade to [employer].  Usage is 100% with average use at 7 hours and 44 minutes.  AHI is 2.0 at his 14 cm set pressure.  He qualified for a new machine and needed to see me in a face-to-face evaluation.  Since I saw him, he was able to receive a new ResMed AirSense 10 AutoSet unit replacement machine on April 18, 2022.  [employer] is his DME company.  He notes significant improvement from his prior machine.  He believes he is sleeping well.  A download was obtained from January 12 of May 17, 2022 which shows 100% usage with average use at 8 hours and 23  minutes.  Had a 14 cm set pressure, AHI is excellent at 2.5.  Subsequent download from March 14 through July 18, 2022 again shows excellent compliance.  AHI is 1.7 at 14 cm set pressure.  He has been using a F20 fullface mask.  He typically goes to bed at 10 PM and wakes up at 6 AM.  He believes he is sleeping well.  He denies residual daytime sleepiness of breakthrough snoring.  His blood pressure has been well-controlled.  He presents  for follow-up evaluation.   Past Medical History:  Diagnosis Date   Arthritis    gout   Cardiomyopathy    EF 30-40% (Normal coronaries cath 2010).  Echo 2015 EF 40%   CHF (congestive heart failure)    Colonic polyp    Diverticulosis of colon    GERD (gastroesophageal reflux disease)    History of chicken pox    HLD (hyperlipidemia)    recent   HTN (hypertension)    recent   Sleep apnea    does use CPAP.     Thyroid disease     Past Surgical History:  Procedure Laterality Date   BILIARY STENT PLACEMENT  11/08/2017   Procedure: BILIARY STENT PLACEMENT;  Surgeon: Jeani Hawking, MD;  Location: [facility] ENDOSCOPY;  Service: Endoscopy;;   BIOPSY  03/08/2018   Procedure: BIOPSY;  Surgeon: Sherrilyn Rist, MD;  Location: [facility] ENDOSCOPY;  Service: Gastroenterology;;   CARDIAC CATHETERIZATION  2010   CHOLECYSTECTOMY N/A 09/07/2017   Procedure: LAPAROSCOPIC CHOLECYSTECTOMY;  Surgeon: Rodman Pickle, MD;  Location: [facility] OR;  Service: General;  Laterality: N/A;  LAPAROSCOPIC CHOLECYSTECTOMY   COLONOSCOPY WITH PROPOFOL N/A 03/08/2018   Procedure: COLONOSCOPY WITH PROPOFOL;  Surgeon: Sherrilyn Rist, MD;  Location: [facility] ENDOSCOPY;  Service: Gastroenterology;  Laterality: N/A;   ERCP N/A 09/11/2017   Procedure: ENDOSCOPIC RETROGRADE CHOLANGIOPANCREATOGRAPHY (ERCP);  Surgeon: Sherrilyn Rist, MD;  Location: [facility] ENDOSCOPY;  Service: Gastroenterology;  Laterality: N/A;   ERCP N/A 11/08/2017   Procedure: ENDOSCOPIC RETROGRADE CHOLANGIOPANCREATOGRAPHY (ERCP);  Surgeon:  Jeani Hawking, MD;  Location[facility] ENDOSCOPY;  Service: Endoscopy;  Laterality: N/A;   ERCP N/A 01/11/2018   Procedure: ENDOSCOPIC RETROGRADE CHOLANGIOPANCREATOGRAPHY;  Surgeon: Sherrilyn Rist, MD;  Location: [facility] ENDOSCOPY;  Service: Gastroenterology;  Laterality: N/A;  With Balloon Sweep of Ducts   REMOVAL OF STONES  11/08/2017   Procedure: REMOVAL OF STONES;  Surgeon: Jeani Hawking, MD;  Location: [facility] ENDOSCOPY;  Service: Endoscopy;;   RIGHT/LEFT HEART CATH AND CORONARY ANGIOGRAPHY N/A 10/12/2017   Procedure: RIGHT/LEFT HEART CATH AND CORONARY ANGIOGRAPHY;  Surgeon: Laurey Morale, MD;  Location[facility] INVASIVE CV LAB;  Service: Cardiovascular;  Laterality: N/A;   SPHINCTEROTOMY  11/08/2017   Procedure: SPHINCTEROTOMY;  Surgeon: Jeani Hawking, MD;  Location: [facility] ENDOSCOPY;  Service: Endoscopy;;   STENT REMOVAL  01/11/2018   Procedure: STENT REMOVAL;  Surgeon: Sherrilyn Rist, MD;  Location: [facility] ENDOSCOPY;  Service: Gastroenterology;;   TONSILLECTOMY AND ADENOIDECTOMY      Current Medications: Outpatient Medications Prior to Visit  Medication Sig Dispense Refill   allopurinol (ZYLOPRIM) 100 MG tablet Take 100 mg by mouth 2 (two) times daily.      Calcium Carb-Cholecalciferol (CALCIUM 600 + D PO) Take 1 tablet by mouth 2 (two) times daily.     carvedilol (COREG) 6.25 MG tablet TAKE 1 TABLET BY MOUTH TWICE DAILY WITH A MEAL. 60 tablet 11   Cholecalciferol (VITAMIN D3) 125 MCG (5000 UT) TABS Take 1 tablet by mouth in the morning and at bedtime.     empagliflozin (JARDIANCE) 10 MG TABS tablet Take 1 tablet (10 mg total) by mouth daily before breakfast. 30 tablet 11   furosemide (LASIX) 20 MG tablet Take 1 tablet (20 mg total) by mouth as needed for fluid or edema. 30 tablet 3   hydrocortisone (ANUSOL-HC) 2.5 % rectal cream Place 1 application. rectally 2 (two) times daily as needed for hemorrhoids. 60 g 1   levothyroxine (SYNTHROID) 50  MCG tablet Take 50 mcg by mouth daily.     Magnesium Oxide 400 MG  CAPS Take 1 capsule (400 mg total) by mouth in the morning and at bedtime. 30 capsule 6   pantoprazole (PROTONIX) 40 MG tablet Take 1 tablet (40 mg total) by mouth daily. 30 tablet 1   potassium chloride SA (KLOR-CON M) 20 MEQ tablet TAKE 1 TABLET BY MOUTH TWICE DAILY 180 tablet 3   rosuvastatin (CRESTOR) 10 MG tablet TAKE 1 TABLET BY MOUTH ONCE A DAY. 90 tablet 3   sacubitril-valsartan (ENTRESTO) 97-103 MG Take 1 tablet by mouth 2 (two) times daily. 180 tablet 3   spironolactone (ALDACTONE) 25 MG tablet TAKE 1 TABLET BY MOUTH DAILY. 90 tablet 3   No facility-administered medications prior to visit.     Allergies:   Ozempic (0.25 or 0.5 mg-dose) [semaglutide(0.25 or 0.5mg -dos)]   Social History   Socioeconomic History   Marital status: Married    Spouse name: Not on file   Number of children: 1   Years of education: Not on file   Highest education level: Not on file  Occupational History   Occupation: Mining engineer  Tobacco Use   Smoking status: Former    Packs/day: 1.50    Years: 3.00    Additional pack years: 0.00    Total pack years: 4.50    Types: Cigarettes    Quit date: 04/07/1980    Years since quitting: 42.3   Smokeless tobacco: Never  Vaping Use   Vaping Use: Never used  Substance and Sexual Activity   Alcohol use: No   Drug use: No    Comment: quit in 1999, marijuana   Sexual activity: Not on file  Other Topics Concern   Not on file  Social History Narrative   Not on file   Social Determinants of Health   Financial Resource Strain: Not on file  Food Insecurity: Not on file  Transportation Needs: Not on file  Physical Activity: Not on file  Stress: Not on file  Social Connections: Not on file     Family History:  The patient's family history is notable for diabetes, hyperlipidemia, lung cancer, stroke, and arthritis.  ROS General: Negative; No fevers, chills, or night sweats;  HEENT: Negative; No changes in vision or hearing, sinus congestion,  difficulty swallowing Pulmonary: Negative; No cough, wheezing, shortness of breath, hemoptysis Cardiovascular: History of nonischemic heart cardiomyopathy, hypertension; exertional dyspnea, occasional swelling, hyperlipidemia GI: Positive for GERD GU: Negative; No dysuria, hematuria, or difficulty voiding Musculoskeletal: Negative; no myalgias, joint pain, or weakness Hematologic/Oncology: Negative; no easy bruising, bleeding Endocrine: Negative; no heat/cold intolerance; no diabetes Neuro: Negative; no changes in balance, headaches Skin: Negative; No rashes or skin lesions Psychiatric: Negative; No behavioral problems, depression Sleep:  See HPI Other comprehensive 14 point system review is negative.   PHYSICAL EXAM:   VS:  BP 110/60   Pulse 66   Ht 5\' 10"  (1.778 m)   Wt 276 lb (125.2 kg)   SpO2 98%   BMI 39.60 kg/m     Repeat blood pressure by me was 102/60  Wt Readings from Last 3 Encounters:  07/21/22 276 lb (125.2 kg)  04/23/22 276 lb 9.6 oz (125.5 kg)  03/24/22 280 lb (127 kg)     General: Alert, oriented, no distress.  Skin: normal turgor, no rashes, warm and dry HEENT: Normocephalic, atraumatic. Pupils equal round and reactive to light; sclera anicteric; extraocular muscles intact; Nose without nasal septal hypertrophy Mouth/Parynx  benign; Mallinpatti scale 3/4 Neck: No JVD, no carotid bruits; normal carotid upstroke Lungs: clear to ausculatation and percussion; no wheezing or rales Chest wall: without tenderness to palpitation Heart: PMI not displaced, RRR, s1 s2 normal, 1/6 systolic murmur, no diastolic murmur, no rubs, gallops, thrills, or heaves Abdomen: soft, nontender; no hepatosplenomehaly, BS+; abdominal aorta nontender and not dilated by palpation. Back: no CVA tenderness Pulses 2+ Musculoskeletal: full range of motion, normal strength, no joint deformities Extremities: no clubbing cyanosis or edema, Homan's sign negative  Neurologic: grossly nonfocal;  Cranial nerves grossly wnl Psychologic: Normal mood and affect     Studies/Labs Reviewed:   March 24, 2022 ECG (independently read by me): NSR at 60, LBBB  June 18, 2020 ECG (independently read by me): Sinus bradycardia at 52; LBBB  08/04/2017 ECG (independently read by me): Sinus bradycardia 55 bpm.  Left bundle branch block with repolarization changes.  No ectopy.  ECG (independently read by me): Sinus bradycardia 57 bpm.  First-degree AV block with PR interval at 200 ms.  Left bundle branch block.  QTc interval 463 ms.  Recent Labs:    Latest Ref Rng & Units 04/23/2022    9:18 AM 10/17/2021    9:42 AM 06/27/2021    2:16 PM  BMP  Glucose 70 - 99 mg/dL 161  99  88   BUN 8 - 23 mg/dL Creatinine 0.61 - 1.24 mg/dL 0.96  0.45  4.09   Sodium 135 - 145 mmol/L 134  135  134   Potassium 3.5 - 5.1 mmol/L 4.4  4.5  4.5   Chloride 98 - 111 mmol/L 100  104  99   CO2 22 - 32 mmol/L Calcium 8.9 - 10.3 mg/dL 9.1  9.0  9.9         Latest Ref Rng & Units 06/27/2021    2:16 PM 12/28/2020    4:20 PM 06/07/2019   10:55 AM  Hepatic Function  Total Protein 6.0 - 8.3 g/dL 7.6  7.6  7.2   Albumin 3.5 - 5.2 g/dL 4.7  4.1  4.1   AST 0 - 37 U/L 16  18  33   ALT 0 - 53 U/L 14  27  42   Alk Phosphatase 39 - 117 U/L 49  43  45   Total Bilirubin 0.2 - 1.2 mg/dL 1.0  0.2  0.6        Latest Ref Rng & Units 06/27/2021    2:16 PM 01/03/2021    5:00 AM 01/02/2021    5:01 AM  CBC  WBC 4.0 - 10.5 K/uL 7.2  7.8  6.6   Hemoglobin 13.0 - 17.0 g/dL 81.1  9.3  9.3   Hematocrit 39.0 - 52.0 % 38.3  28.0  27.4   Platelets 150.0 - 400.0 K/uL 219.0  155  138    Lab Results  Component Value Date   MCV 93.1 06/27/2021   MCV 98.2 01/03/2021   MCV 96.8 01/02/2021   Lab Results  Component Value Date   TSH 1.02 06/27/2021   Lab Results  Component Value Date   HGBA1C 6.2 (H) 12/28/2020     BNP    Component Value Date/Time   BNP 18.7 11/20/2020 0923    ProBNP     Component Value Date/Time   PROBNP <30.0 03/12/2009 1249     Lipid Panel     Component Value Date/Time  CHOL 98 10/17/2021 0942   TRIG 95 10/17/2021 0942   HDL 30 (L) 10/17/2021 0942   CHOLHDL 3.3 10/17/2021 0942   VLDL 19 10/17/2021 0942   LDLCALC 49 10/17/2021 0942   LDLDIRECT 161.2 10/19/2008 0946     RADIOLOGY: No results found.   Additional studies/ records that were reviewed today include:  I review the office records of Dr. Delrae Sawyers, the patient's sleep study with CPAP titration, and have obtain a download from March 25 through 07/28/2016.  I reviewed the most recent records of Dr. Shirlee Latch.   New download from May 16, 2020 through June 14, 2020 was obtained.  ASSESSMENT:    1. OSA on CPAP   2. NICM (nonischemic cardiomyopathy)   3. LBBB (left bundle branch block)   4. Morbid obesity     PLAN:  Nicholas Gray is a 62 year old male who has a history of a nonischemic cardiomyopathy with an ejection fraction initially at 30-35% which had decreased to 25 to 30% in May 2019.  He is now followed by Dr. Shirlee Latch is on advanced heart failure medications including carvedilol 25 mg twice a day, Entresto 97/103 mg twice a day, Farxiga 10 mg, furosemide 20 mg as needed and spironolactone 25 mg daily.  He has been on CPAP therapy since his set up date on June 21, 2015.  With the upgrade to [employer], his old machine was no longer wireless.  He received a new ResMed AirSense 10 AutoSet unit on April 18, 2022.  I reviewed his compliance data since receiving his new device.  Compliance continues to be excellent with 100% use with average use greater than 8 hours per night.  His machine is currently set at 14 cm.  He has been using a fullface mask.  He typically goes to bed at 10 PM and wakes up around 6 AM.  I again discussed the benefit of CPAP therapy particularly with reference to his cardiovascular health and commended him on his commitment for continued use.  Since at  times he may require potential slight pressure alteration, I suggested that we change him from a set pressure at 14 cm to a pressure range of 14 up to 18 cm.  I discussed that in the event he needed more pressure on certain nights, this will allow his pressure to increase as needed but it will not increase unless it is necessary.  He feels very comfortable using his F20 fullface mask.  His blood pressure today is stable on most days on guideline directed medical therapy with carvedilol 6.25 mg twice a day, Entresto 97/103 mg twice a day, Jardiance 10 mg, as needed furosemide, in addition to spironolactone 25 mg.  He is on levothyroxine 50 mcg for hypothyroidism.  He is on rosuvastatin for hyperlipidemia with LDL cholesterol 49 in July 2023.  He will follow-up with Dr. Shirlee Latch.  I will see him in 1 year for reevaluation.    Medication Adjustments/Labs and Tests Ordered: Current medicines are reviewed at length with the patient today.  Concerns regarding medicines are outlined above.  Medication changes, Labs and Tests ordered today are listed in the Patient Instructions below. Patient Instructions  Medication Instructions:  None ordered   Lab Work: None ordered   Testing/Procedures: None ordered   Follow-Up: At [facility], you and your health needs are our priority.  As part of our continuing mission to provide you with exceptional heart care, we have created designated Provider Care Teams.  These Care  Teams include your primary Cardiologist (physician) and Advanced Practice Providers (APPs -  Physician Assistants and Nurse Practitioners) who all work together to provide you with the care you need, when you need it.  We recommend signing up for the patient portal called "MyChart".  Sign up information is provided on this After Visit Summary.  MyChart is used to connect with patients for Virtual Visits (Telemedicine).  Patients are able to view lab/test results, encounter notes,  upcoming appointments, etc.  Non-urgent messages can be sent to your provider as well.   To learn more about what you can do with MyChart, go to [email or web address].    Your next appointment:  1 year    Call in Jan to schedule April appointment     Provider:  [facility]     Signed, Nicki Guadalajara, MD,FACC, ABSM Diplomate, American Board of Sleep Medicine  07/26/2022 8:00 AM    [facility] [address], [address], [place], [place]  [address] Phone: [phone]

## 2022-07-26 ENCOUNTER — Encounter: Payer: Self-pay | Admitting: Cardiovascular Disease

## 2022-07-28 ENCOUNTER — Other Ambulatory Visit (HOSPITAL_COMMUNITY): Payer: Self-pay | Admitting: Family Medicine

## 2022-08-07 DIAGNOSIS — E538 Deficiency of other specified B group vitamins: Secondary | ICD-10-CM | POA: Diagnosis not present

## 2022-08-17 DIAGNOSIS — G4733 Obstructive sleep apnea (adult) (pediatric): Secondary | ICD-10-CM | POA: Diagnosis not present

## 2022-08-19 ENCOUNTER — Other Ambulatory Visit (HOSPITAL_COMMUNITY): Payer: Self-pay | Admitting: Family Medicine

## 2022-08-29 DIAGNOSIS — E538 Deficiency of other specified B group vitamins: Secondary | ICD-10-CM | POA: Diagnosis not present

## 2022-09-02 ENCOUNTER — Other Ambulatory Visit (HOSPITAL_COMMUNITY): Payer: Self-pay

## 2022-09-02 ENCOUNTER — Ambulatory Visit: Payer: 59 | Admitting: Gastroenterology

## 2022-09-02 ENCOUNTER — Encounter: Payer: Self-pay | Admitting: Gastroenterology

## 2022-09-02 ENCOUNTER — Telehealth: Payer: Self-pay | Admitting: Pharmacy Technician

## 2022-09-02 VITALS — BP 110/64 | HR 57 | Ht 70.0 in | Wt 273.0 lb

## 2022-09-02 DIAGNOSIS — K9089 Other intestinal malabsorption: Secondary | ICD-10-CM | POA: Diagnosis not present

## 2022-09-02 DIAGNOSIS — R143 Flatulence: Secondary | ICD-10-CM | POA: Diagnosis not present

## 2022-09-02 DIAGNOSIS — R140 Abdominal distension (gaseous): Secondary | ICD-10-CM | POA: Diagnosis not present

## 2022-09-02 MED ORDER — COLESEVELAM HCL 625 MG PO TABS: ORAL_TABLET | ORAL | 3 refills | Status: DC

## 2022-09-02 NOTE — Telephone Encounter (Signed)
Pharmacy Patient Advocate Encounter  Received notification from [facility] that the request for prior authorization for COLESEVELAM 625MG has been denied due to     Please be advised we currently do not have a Pharmacist to review denials, therefore you will need to process appeals accordingly as needed. Thanks for your support at this time.   You may call [phone] or fax [phone], to appeal.

## 2022-09-02 NOTE — Patient Instructions (Signed)
_______________________________________________________  If your blood pressure at your visit was 140/90 or greater, please contact your primary care physician to follow up on this.  _______________________________________________________  If you are age 63 or older, your body mass index should be between 23-30. Your Body mass index is 39.17 kg/m. If this is out of the aforementioned range listed, please consider follow up with your Primary Care Provider.  If you are age 30 or younger, your body mass index should be between 19-25. Your Body mass index is 39.17 kg/m. If this is out of the aformentioned range listed, please consider follow up with your Primary Care Provider.   ________________________________________________________  The [facility] providers would like to encourage you to use [facility] to communicate with providers for non-urgent requests or questions.  Due to long hold times on the telephone, sending your provider a message by [facility] may be a faster and more efficient way to get a response.  Please allow 48 business hours for a response.  Please remember that this is for non-urgent requests.  _______________________________________________________  Please follow up in 2 months  _______________________________________________________  Food Guidelines for those with chronic digestive trouble:  Many people have difficulty digesting certain foods, causing a variety of distressing and embarrassing symptoms such as abdominal pain, bloating and gas.  These foods may need to be avoided or consumed in small amounts.  Here are some tips that might be helpful for you.  1.   Lactose intolerance is the difficulty or complete inability to digest lactose, the natural sugar in milk and anything made from milk.  This condition is harmless, common, and can begin any time during life.  Some people can digest a modest amount of lactose while others cannot tolerate any.  Also, not all dairy  products contain equal amounts of lactose.  For example, hard cheeses such as parmesan have less lactose than soft cheeses such as cheddar.  Yogurt has less lactose than milk or cheese.  Many packaged foods (even many brands of bread) have milk, so read ingredient lists carefully.  It is difficult to test for lactose intolerance, so just try avoiding lactose as much as possible for a week and see what happens with your symptoms.  If you seem to be lactose intolerant, the best plan is to avoid it (but make sure you get calcium from another source).  The next best thing is to use lactase enzyme supplements, available over the counter everywhere.  Just know that many lactose intolerant people need to take several tablets with each serving of dairy to avoid symptoms.  Lastly, a lot of restaurant food is made with milk or butter.  Many are things you might not suspect, such as mashed potatoes, rice and pasta (cooked with butter) and "grilled" items.  If you are lactose intolerant, it never hurts to ask your server what has milk or butter.  2.   Fiber is an important part of your diet, but not all fiber is well-tolerated.  Insoluble fiber such as bran is often consumed by normal gut bacteria and converted into gas.  Soluble fiber such as oats, squash, carrots and green beans are typically tolerated better.  3.   Some types of carbohydrates can be poorly digested.  Examples include: fructose (apples, cherries, pears, raisins and other dried fruits), fructans (onions, zucchini, large amounts of wheat), sorbitol/mannitol/xylitol and sucralose/Splenda (common artificial sweeteners), and raffinose (lentils, broccoli, cabbage, asparagus, brussel sprouts, many types of beans).  Do a Programmer[employer] multimedia for [employer]  diet and you will find helpful information. Beano, a dietary supplement, will often help with raffinose-containing foods.  As with lactase tablets, you may need several per serving.  4.   Whenever possible, avoid  processed food&meats and chemical additives.  High fructose corn syrup, a common sweetener, may be difficult to digest.  Eggs and soy (comes from the soybean, and added to many foods now) are other common bloating/gassy foods.  5.  Regarding gluten:  gluten is a protein mainly found in wheat, but also rye and barley.  There is a condition called celiac sprue, which is an inflammatory reaction in the small intestine causing a variety of digestive symptoms.  Blood testing is highly reliable to look for this condition, and sometimes upper endoscopy with small bowel biopsies may be necessary to make the diagnosis.  Many patients who test negative for celiac sprue report improvement in their digestive symptoms when they switch to a gluten-free diet.  However, in these "non-celiac gluten sensitive" patients, the true role of gluten in their symptoms is unclear.  Reducing carbohydrates in general may decrease the gas and bloating caused when gut bacteria consume carbs. Also, some of these patients may actually be intolerant of the baker's yeast in bread products rather than the gluten.  Flatbread and other reduced yeast breads might therefore be tolerated.  There is no specific testing available for most food intolerances, which are discovered mainly by dietary elimination.  Please do not embark on a gluten free diet unless directed by your doctor, as it is highly restrictive, and may lead to nutritional deficiencies if not carefully monitored.  Lastly, beware of internet claims offering "personalized" tests for food intolerances.  Such testing has no reliable scientific evidence to support its reliability and correlation to symptoms.    6.  The best advice is old advice, especially for those with chronic digestive trouble - try to eat "clean".  Balanced diet, avoid processed food, plenty of fruits and vegetables, cut down the sugar, minimal alcohol, avoid tobacco. Make time to care for yourself, get enough sleep,  exercise when you can, reduce stress.  Your guts will thank you for it.   - Dr. Sherly[facility]  ____________________________________________________________

## 2022-09-02 NOTE — Progress Notes (Addendum)
[facility] progress note:  History: Nicholas Gray 09/02/2022  Referring provider: Elfredia Nevins, MD  Reason for consult/chief complaint: Diarrhea (Pt states he is still having intermittent diarrhea and a lot of gas )   Subjective  HPI: From my last office note April 2023: "Nicholas Gray was seen by our PA on 06/27/2021 for diarrhea occurring since his 2019 cholecystectomy as well as more recent rectal bleeding felt likely to be hemorrhoidal.  Additional lab and stool studies were done with results as below. C. difficile PCR was positive, and on 07/01/2021 he was prescribed vancomycin 125 mg 4 times daily for 14 days (and S. Boulardi probiotic.)   Nicholas Gray is glad to report that his severe diarrhea has significantly improved after the vancomycin.  He has also continued to take the probiotic, and I recommended he can now stop that. He is back to his baseline bowel habits of 2-4 semiformed and sometimes loose BMs per day.  His bleeding has stopped, and he says there was really only 2 or 3 episodes of small-volume bleeding that occurred prior to his visit last month.  He denies rectal pain.   When he considers it further, he recalls that the diarrhea significantly worsened about a month after his hospitalization for pneumatosis intestinalis last fall, and he received antibiotics during that treatment as well." __________________ At last visit we tried him on welchol 1250 mg once daily and he seems to recall he was better while on it.  Refills ran out he stopped taking it.  He then had recurrence of multiple loose stools per day without blood and abdominal bloating and gas.  Negative celiac testing with TTG antibody and total IgA level March 2023 Normal fecal elastase March 2023 when the C. difficile was discovered.  Surveillance colonoscopy December 2019 for history of adenomatous and hyperplastic polyps in 2014:   Diminutive SSP removed, 5-year recall  recommended. ROS:  Review of Systems Negative for chest pain dyspnea or dysuria Arthralgias Fatigue Remainder systems negative except as above  Past Medical History: Past Medical History:  Diagnosis Date   Arthritis    gout   Cardiomyopathy    EF 30-40% (Normal coronaries cath 2010).  Echo 2015 EF 40%   CHF (congestive heart failure) (HCC)    Colonic polyp    Diverticulosis of colon    GERD (gastroesophageal reflux disease)    History of chicken pox    HLD (hyperlipidemia)    recent   HTN (hypertension)    recent   Sleep apnea    does use CPAP.     Thyroid disease    Most recent cardiology office note January 2024 by Dr. Shirlee Latch was reviewed.  Patient's cardiac status stable at that time.   Past Surgical History: Past Surgical History:  Procedure Laterality Date   BILIARY STENT PLACEMENT  11/08/2017   Procedure: BILIARY STENT PLACEMENT;  Surgeon: Jeani Hawking, MD;  Location: [facility] ENDOSCOPY;  Service: Endoscopy;;   BIOPSY  03/08/2018   Procedure: BIOPSY;  Surgeon: Sherrilyn Rist, MD;  Location: [facility] ENDOSCOPY;  Service: Gastroenterology;;   CARDIAC CATHETERIZATION  2010   CHOLECYSTECTOMY N/A 09/07/2017   Procedure: LAPAROSCOPIC CHOLECYSTECTOMY;  Surgeon: Rodman Pickle, MD;  Location: [facility] OR;  Service: General;  Laterality: N/A;  LAPAROSCOPIC CHOLECYSTECTOMY   COLONOSCOPY WITH PROPOFOL N/A 03/08/2018   Procedure: COLONOSCOPY WITH PROPOFOL;  Surgeon: Sherrilyn Rist, MD;  Location: [facility] ENDOSCOPY;  Service: Gastroenterology;  Laterality: N/A;   ERCP N/A 09/11/2017  Procedure: ENDOSCOPIC RETROGRADE CHOLANGIOPANCREATOGRAPHY (ERCP);  Surgeon: Sherrilyn Rist, MD;  Location: [facility] ENDOSCOPY;  Service: Gastroenterology;  Laterality: N/A;   ERCP N/A 11/08/2017   Procedure: ENDOSCOPIC RETROGRADE CHOLANGIOPANCREATOGRAPHY (ERCP);  Surgeon: Jeani Hawking, MD;  Location: [facility] ENDOSCOPY;  Service: Endoscopy;  Laterality: N/A;   ERCP N/A 01/11/2018   Procedure: ENDOSCOPIC RETROGRADE  CHOLANGIOPANCREATOGRAPHY;  Surgeon: Sherrilyn Rist, MD;  Location: [facility] ENDOSCOPY;  Service: Gastroenterology;  Laterality: N/A;  With Balloon Sweep of Ducts   REMOVAL OF STONES  11/08/2017   Procedure: REMOVAL OF STONES;  Surgeon: Jeani Hawking, MD;  Location: [facility] ENDOSCOPY;  Service: Endoscopy;;   RIGHT/LEFT HEART CATH AND CORONARY ANGIOGRAPHY N/A 10/12/2017   Procedure: RIGHT/LEFT HEART CATH AND CORONARY ANGIOGRAPHY;  Surgeon: Laurey Morale, MD;  Location: [facility] INVASIVE CV LAB;  Service: Cardiovascular;  Laterality: N/A;   SPHINCTEROTOMY  11/08/2017   Procedure: SPHINCTEROTOMY;  Surgeon: Jeani Hawking, MD;  Location: [facility] ENDOSCOPY;  Service: Endoscopy;;   STENT REMOVAL  01/11/2018   Procedure: STENT REMOVAL;  Surgeon: Sherrilyn Rist, MD;  Location: [facility] ENDOSCOPY;  Service: Gastroenterology;;   TONSILLECTOMY AND ADENOIDECTOMY       Family History: Family History  Problem Relation Age of Onset   Colon polyps Brother    Colon polyps Brother    Arthritis Other        family history   Diabetes Other        family history   Hyperlipidemia Other        family history   Lung cancer Other        family history   Stroke Other        family history   Colon cancer Neg Hx    Esophageal cancer Neg Hx    Stomach cancer Neg Hx     Social History: Social History   Socioeconomic History   Marital status: Married    Spouse name: Not on file   Number of children: 1   Years of education: Not on file   Highest education level: Not on file  Occupational History   Occupation: Mining engineer  Tobacco Use   Smoking status: Former    Packs/day: 1.50    Years: 3.00    Additional pack years: 0.00    Total pack years: 4.50    Types: Cigarettes    Quit date: 04/07/1980    Years since quitting: 42.4   Smokeless tobacco: Never  Vaping Use   Vaping Use: Never used  Substance and Sexual Activity   Alcohol use: No   Drug use: No    Comment: quit in 1999, marijuana   Sexual activity: Not  on file  Other Topics Concern   Not on file  Social History Narrative   Not on file   Social Determinants of Health   Financial Resource Strain: Not on file  Food Insecurity: Not on file  Transportation Needs: Not on file  Physical Activity: Not on file  Stress: Not on file  Social Connections: Not on file    Allergies: Allergies  Allergen Reactions   Ozempic (0.25 Or 0.5 Mg-Dose) [Semaglutide(0.25 Or 0.5mg -Dos)]     GI upset on 0.25mg  dose    Outpatient Meds: Current Outpatient Medications  Medication Sig Dispense Refill   allopurinol (ZYLOPRIM) 100 MG tablet Take 100 mg by mouth 2 (two) times daily.      Calcium Carb-Cholecalciferol (CALCIUM 600 + D PO) Take 1 tablet by mouth 2 (two) times daily.  carvedilol (COREG) 6.25 MG tablet TAKE 1 TABLET BY MOUTH TWICE DAILY WITH A MEAL. 60 tablet 11   Cholecalciferol (VITAMIN D3) 125 MCG (5000 UT) TABS Take 1 tablet by mouth in the morning and at bedtime.     colesevelam (WELCHOL) 625 MG tablet Take 2 tablets at lunchtime. 60 tablet 3   empagliflozin (JARDIANCE) 10 MG TABS tablet Take 1 tablet (10 mg total) by mouth daily before breakfast. 30 tablet 11   furosemide (LASIX) 20 MG tablet Take 1 tablet (20 mg total) by mouth as needed for fluid or edema. 30 tablet 3   hydrocortisone (ANUSOL-HC) 2.5 % rectal cream Place 1 application. rectally 2 (two) times daily as needed for hemorrhoids. 60 g 1   levothyroxine (SYNTHROID) 50 MCG tablet Take 50 mcg by mouth daily.     Magnesium Oxide 400 MG CAPS Take 1 capsule (400 mg total) by mouth in the morning and at bedtime. 30 capsule 6   pantoprazole (PROTONIX) 40 MG tablet Take 1 tablet (40 mg total) by mouth daily. 30 tablet 1   potassium chloride SA (KLOR-CON M) 20 MEQ tablet TAKE 1 TABLET BY MOUTH TWICE DAILY 180 tablet 3   rosuvastatin (CRESTOR) 10 MG tablet TAKE 1 TABLET BY MOUTH ONCE A DAY. 90 tablet 3   sacubitril-valsartan (ENTRESTO) 97-103 MG TAKE 1 TABLET BY MOUTH TWICE DAILY. 60  tablet 6   spironolactone (ALDACTONE) 25 MG tablet TAKE 1 TABLET BY MOUTH DAILY. 90 tablet 3   No current facility-administered medications for this visit.      ___________________________________________________________________ Objective   Exam:  BP 110/64   Pulse (!) 57   Ht 5\' 10"  (1.778 m)   Wt 273 lb (123.8 kg)   BMI 39.17 kg/m  Wt Readings from Last 3 Encounters:  09/02/22 273 lb (123.8 kg)  07/21/22 276 lb (125.2 kg)  04/23/22 276 lb 9.6 oz (125.5 kg)    General: Well-appearing Eyes: sclera anicteric, no redness ENT: oral mucosa moist without lesions, no cervical or supraclavicular lymphadenopathy CV: Regular, no JVD, no peripheral edema Resp: clear to auscultation bilaterally, normal RR and effort noted GI: soft, no tenderness, with active bowel sounds. No guarding or palpable organomegaly noted. Skin; warm and dry, no rash or jaundice noted Neuro: awake, alert and oriented x 3. Normal gross motor function and fluent speech  Data:  Last echocardiogram report July 2023  1. Left ventricular ejection fraction, by estimation, is 45%. The left  ventricle has mildly decreased function. The left ventricle demonstrates  global hypokinesis with septal-lateral dyssynchrony due to LBBB. Left  ventricular diastolic parameters are  consistent with Grade I diastolic dysfunction (impaired relaxation).   2. Right ventricular systolic function is normal. The right ventricular  size is mildly enlarged. There is normal pulmonary artery systolic  pressure. The estimated right ventricular systolic pressure is 30.0 mmHg.   3. Right atrial size was mildly dilated.   4. The mitral valve is normal in structure. Trivial mitral valve  regurgitation. No evidence of mitral stenosis.   5. The aortic valve is tricuspid. Aortic valve regurgitation is not  visualized. No aortic stenosis is present.   6. The inferior vena cava is normal in size with greater than 50%  respiratory  variability, suggesting right atrial pressure of 3 mmHg.   Assessment: Encounter Diagnoses  Name Primary?   Bile salt-induced diarrhea Yes   Abdominal bloating    Flatulence     Bile salt diarrhea, and I believe this accounts for  most of his symptoms.  He is particular troubled by bloating and gas, which may also have a maldigestion/dietary component. History of C. difficile, and he says stool test were done by primary care recently but we do not have the results.  No high risk factors for SIBO. Plan: Welchol 625 mg tablet, 2 tablets daily about midday (which would be a few hours away from his other medicines)  Food guidelines given regarding common causes of maldigestion/bloating and gas  Get PCP stool study results  RTO 2 months.  If not much improved, colonoscopy to rule out microscopic colitis and for history of colon polyp.    Thank you for the courtesy of this consult.  Please call me with any questions or concerns.  Charlie Pitter III  CC: Referring provider noted above _____________________  Record review addendum 09/15/2022  Records from Dr. Lyman Bishop Fusco's office received  Celiac antibody panel (deamidated Gliadin IgA and IgG, TTG IgA and IgG, endomysial antibody IgA all negative with normal total IgA level of 225  Fecal elastase normal at 252  Stool study for Salmonella Shigella Campylobacter E. coli Shiga toxin and C. difficile A/B toxin all negative   - Amada Jupiter, MD    [facility]

## 2022-09-02 NOTE — Telephone Encounter (Signed)
Patient Advocate Encounter  Received notification from [facility] that prior authorization for COLESEVELAM 625MG  is required.   PA submitted on 5.28.24 Key [id number] Status is pending

## 2022-09-03 MED ORDER — COLESTIPOL HCL 1 G PO TABS: ORAL_TABLET | ORAL | 2 refills | Status: DC

## 2022-09-03 NOTE — Telephone Encounter (Signed)
Based on the listed formulary alternatives, my recommendation is:  Colestipol 2 gram tablet One tablet daily. Disp#30, RF 2  I would like him to give [place] an update in 2-3 weeks on how that is working so we can decide if a dose change is needed.  - Dr. Myrtie Neither

## 2022-09-03 NOTE — Telephone Encounter (Signed)
Rx has been changed as instructed by Dr. Myrtie Neither. I sent a mychart msg with this updated information.

## 2022-09-17 DIAGNOSIS — G4733 Obstructive sleep apnea (adult) (pediatric): Secondary | ICD-10-CM | POA: Diagnosis not present

## 2022-09-26 DIAGNOSIS — E538 Deficiency of other specified B group vitamins: Secondary | ICD-10-CM | POA: Diagnosis not present

## 2022-10-17 DIAGNOSIS — G4733 Obstructive sleep apnea (adult) (pediatric): Secondary | ICD-10-CM | POA: Diagnosis not present

## 2022-10-23 DIAGNOSIS — E538 Deficiency of other specified B group vitamins: Secondary | ICD-10-CM | POA: Diagnosis not present

## 2022-10-30 ENCOUNTER — Encounter (HOSPITAL_COMMUNITY): Payer: Self-pay | Admitting: Cardiology

## 2022-10-30 ENCOUNTER — Ambulatory Visit (HOSPITAL_COMMUNITY)
Admission: RE | Admit: 2022-10-30 | Discharge: 2022-10-30 | Disposition: A | Discharge status: Home / Self Care | Payer: 59 | Source: Ambulatory Visit | Attending: Cardiology | Admitting: Cardiology

## 2022-10-30 VITALS — BP 102/60 | HR 56 | Wt 273.8 lb

## 2022-10-30 DIAGNOSIS — Z87891 Personal history of nicotine dependence: Secondary | ICD-10-CM | POA: Diagnosis not present

## 2022-10-30 DIAGNOSIS — I447 Left bundle-branch block, unspecified: Secondary | ICD-10-CM | POA: Insufficient documentation

## 2022-10-30 DIAGNOSIS — Z6839 Body mass index (BMI) 39.0-39.9, adult: Secondary | ICD-10-CM | POA: Insufficient documentation

## 2022-10-30 DIAGNOSIS — G4733 Obstructive sleep apnea (adult) (pediatric): Secondary | ICD-10-CM | POA: Diagnosis not present

## 2022-10-30 DIAGNOSIS — I5022 Chronic systolic (congestive) heart failure: Secondary | ICD-10-CM | POA: Diagnosis not present

## 2022-10-30 DIAGNOSIS — I110 Hypertensive heart disease with heart failure: Secondary | ICD-10-CM | POA: Insufficient documentation

## 2022-10-30 DIAGNOSIS — I428 Other cardiomyopathies: Secondary | ICD-10-CM | POA: Diagnosis not present

## 2022-10-30 DIAGNOSIS — Z79899 Other long term (current) drug therapy: Secondary | ICD-10-CM | POA: Diagnosis not present

## 2022-10-30 DIAGNOSIS — E669 Obesity, unspecified: Secondary | ICD-10-CM | POA: Insufficient documentation

## 2022-10-30 DIAGNOSIS — E785 Hyperlipidemia, unspecified: Secondary | ICD-10-CM | POA: Diagnosis not present

## 2022-10-30 LAB — BASIC METABOLIC PANEL
Anion gap: 11 (ref 5–15)
BUN: 14 mg/dL (ref 8–23)
CO2: 23 mmol/L (ref 22–32)
Calcium: 8.9 mg/dL (ref 8.9–10.3)
Chloride: 100 mmol/L (ref 98–111)
Creatinine, Ser: 1.43 mg/dL — ABNORMAL HIGH (ref 0.61–1.24)
GFR, Estimated: 55 mL/min — ABNORMAL LOW (ref >60)
Glucose, Bld: 111 mg/dL — ABNORMAL HIGH (ref 70–99)
Potassium: 4.6 mmol/L (ref 3.5–5.1)
Sodium: 134 mmol/L — ABNORMAL LOW (ref 135–145)

## 2022-10-30 LAB — LIPID PANEL
Cholesterol: 100 mg/dL (ref 0–200)
HDL: 22 mg/dL — ABNORMAL LOW (ref >40)
LDL Cholesterol: 27 mg/dL (ref 0–99)
Total CHOL/HDL Ratio: 4.5 RATIO
Triglycerides: 254 mg/dL — ABNORMAL HIGH (ref <150)
VLDL: 51 mg/dL — ABNORMAL HIGH (ref 0–40)

## 2022-10-30 LAB — BRAIN NATRIURETIC PEPTIDE: B Natriuretic Peptide: 7.3 pg/mL (ref 0.0–100.0)

## 2022-10-30 NOTE — Progress Notes (Signed)
Advanced [facility] Failure Clinic Note   PCP: Elfredia Nevins, MD PCP-Cardiologist: Rollene Rotunda, MD  HF Cardiology: Dr. Shirlee Latch  HPI:  Nicholas Gray is a 62 y.o. male with chronic systolic CHF, NICM (Normal coronaries 2010), HTN, LBBB, Obesity, and OSA on CPAP.  He has been known to have a cardiomyopathy for the last 10 years or so.   Seen in [facility] office 08/13/17 and was overall feeling well. No new symptoms.  Echo 08/27/17 LVEF 25-30%, Grade 1 DD, Mild MR, Mild LAE, mild RV dilation, PA peak pressure 34 mm Hg.  Referred by Dr. Antoine Poche to CHF team for further evaluation and treatment of CHF.    Pt admitted 5/30 - 09/13/2017 with Chest/upper abdominal discomfort and pain. CT scan and HIDA consistent with acute cholecystitis. Underwent laproscopic chole on 09/07/17 and found to have gangrenous GB. 09/09/17 repeat HIDA scan showed bile leak. ERCP 09/11/17, but patient had failed stent placement. Considered for IR for drain placement, but decided on conservative management.  He was admitted again with RUQ pain in 7/19, had ERCP with bile leak and choledocholithiasis noted.  He had stone removal and stent placed.   LHC/RHC in 7/19 showed no significant CAD, optimized filling pressures and preserved cardiac output. Cardiac MRI in 11/19 showed improvement in LV EF to 51% with mildly decreased RV systolic function, no LGE.   Echo in 5/21 showed EF in the range of 40% though it is a technically difficult study.  There is septal-lateral dyssynchrony. Cardiac MRI in 6/21 showed LV EF 46% with septal-lateral dyssynchrony, RV EF 51%, no LGE.  Echo in 8/22 showed EF 45% with mild diffuse hypokinesis and septal-lateral dyssynchrony, normal RV.   Patient started Ozempic for weight loss.  He lost weight but developed profuse diarrhea and actually ended up in the hospital with pneumatosis intestinalis.  He is now off Ozempic.   Echo in 7/23 showed EF 45%, septal-lateral dyssynchrony, normal RV systolic function with  mild RV enlargement, IVC normal.    He returns today for followup of CHF.  Weight is down 3 lbs.  Chronic diarrhea has improved with use of colestipol.  He is working 2 days/week. No exertional dyspnea or chest pain. No orthopnea/PND.  He uses CPAP regularly.   ECG (personally reviewed): NSR, LBBB 156 msec  Labs (6/19): K 4.2, creatinine 1.14 Labs (8/19): K 3.9, creatinine 1.14, normal LFTs Labs (01/05/2018): K 4.5 creatinine 1.23  Labs (11/19): K 3.9, creatinine 1.16 Labs (1/20): K 3.9, creatinine 1.13 Labs (3/21): K 4.2, creatinine 1.28, hgb 13.7, LDL 57, HDL 28 Labs (5/21): K 4.2, creatinine 1.33 Labs (11/21): K 4.1, creatinine 1.43 Labs (1/22): LDL 48 Labs (4/22): K 3.8, creatinine 1.16 Labs (10/22): LDL 45 Labs (12/22): Mg 1.8, K 3.9, creatinine 1.02 Labs (3/23): K 4.5, creatinine 1.5 Labs (7/23): LDL 49, K 4.5, creatinine 4.09 Labs (1/24): K 4.4, creatinine 1.36  Social History: Lives in [place], [place].  He is a former smoker, quit in 1982. Denies heavy ETOH use.  Family history: He denies immediate family history of CHF, but thinks his maternal grandparents may have had "heart problems"   Review of systems complete and found to be negative unless listed in HPI.    Past Medical History: 1. Chronic systolic CHF: Suspected nonischemic cardiomyopathy (normal coronaries 2010).  - Echo 2012 with EF 30% - Echo 12/15 with EF 40-45% - Echo 2017 with EF 35-40% - Echo 08/27/17 LVEF 25-30%, Grade 1 DD, Mild MR, Mild LAE, mild RV dilation,  PA peak pressure 34 mm Hg.  - LHC/RHC (7/19): No significant coronary disease. RA 7, PA 39/12 mean 25, PCWP mean 11, CI 3.68, PVR 1.65 WU.  - Cardiac MRI (11/19): LV EF 51% with septal-lateral dyssynchrony, mild RV dilation with RV EF 39%, no LGE.  - Echo (5/21): Technically difficult, EF around 40% with diffuse hypokinesis and septal-lateral dyssynchrony, normal RV.  - Cardiac MRI (6/21): LV EF 46% with septal-lateral dyssynchrony, RV EF 51%, no LGE.  -  Echo (8/22): EF 45% with mild diffuse hypokinesis and septal-lateral dyssynchrony, normal RV.  - Echo (7/23): EF 45%, septal-lateral dyssynchrony, normal RV systolic function with mild RV enlargement, IVC normal.   2. HTN 3. LBBB, chronic 4. Obesity Body mass index is 39.29 kg/m. 5. OSA: Reports compliance with his CPAP.  6. GERD 7. Cholecystitis: Underwent laproscopic chole on 09/07/17 and found to have gangrenous GB.  - 09/09/17 repeat HIDA scan showed bile leak.  - ERCP 09/11/17, but patient had failed stent placement. - ERCP 7/19 with sphincterotomy, stone removal, stent placement.   Current Outpatient Medications  Medication Sig Dispense Refill   allopurinol (ZYLOPRIM) 100 MG tablet Take 100 mg by mouth 2 (two) times daily.      Calcium Carb-Cholecalciferol (CALCIUM 600 + D PO) Take 1 tablet by mouth 2 (two) times daily.     carvedilol (COREG) 6.25 MG tablet TAKE 1 TABLET BY MOUTH TWICE DAILY WITH A MEAL. 60 tablet 11   Cholecalciferol (VITAMIN D3) 125 MCG (5000 UT) TABS Take 1 tablet by mouth in the morning and at bedtime.     colestipol (COLESTID) 1 g tablet Take 2 mg once a day 60 tablet 2   Cyanocobalamin (B-12 COMPLIANCE INJECTION IJ) Inject 1 Application as directed every 28 (twenty-eight) days.     empagliflozin (JARDIANCE) 10 MG TABS tablet Take 1 tablet (10 mg total) by mouth daily before breakfast. 30 tablet 11   furosemide (LASIX) 20 MG tablet Take 1 tablet (20 mg total) by mouth as needed for fluid or edema. 30 tablet 3   hydrocortisone (ANUSOL-HC) 2.5 % rectal cream Place 1 application. rectally 2 (two) times daily as needed for hemorrhoids. 60 g 1   levothyroxine (SYNTHROID) 50 MCG tablet Take 50 mcg by mouth daily.     Magnesium Oxide 400 MG CAPS Take 1 capsule (400 mg total) by mouth in the morning and at bedtime. 30 capsule 6   pantoprazole (PROTONIX) 40 MG tablet Take 1 tablet (40 mg total) by mouth daily. 30 tablet 1   potassium chloride SA (KLOR-CON M) 20 MEQ tablet  TAKE 1 TABLET BY MOUTH TWICE DAILY 180 tablet 3   rosuvastatin (CRESTOR) 10 MG tablet TAKE 1 TABLET BY MOUTH ONCE A DAY. 90 tablet 3   sacubitril-valsartan (ENTRESTO) 97-103 MG TAKE 1 TABLET BY MOUTH TWICE DAILY. 60 tablet 6   spironolactone (ALDACTONE) 25 MG tablet TAKE 1 TABLET BY MOUTH DAILY. 90 tablet 3   No current facility-administered medications for this encounter.   Allergies  Allergen Reactions   Ozempic (0.25 Or 0.5 Mg-Dose) [Semaglutide(0.25 Or 0.5mg -Dos)]     GI upset on 0.25mg  dose    Vitals:   10/30/22 0824  BP: 102/60  Pulse: (!) 56  SpO2: 96%  Weight: 124.2 kg (273 lb 12.8 oz)   Wt Readings from Last 3 Encounters:  10/30/22 124.2 kg (273 lb 12.8 oz)  09/02/22 123.8 kg (273 lb)  07/21/22 125.2 kg (276 lb)    PHYSICAL EXAM: General: NAD  Neck: No JVD, no thyromegaly or thyroid nodule.  Lungs: Clear to auscultation bilaterally with normal respiratory effort. CV: Nondisplaced PMI.  Heart regular S1/S2, no S3/S4, no murmur.  No peripheral edema.  No carotid bruit.  Normal pedal pulses.  Abdomen: Soft, nontender, no hepatosplenomegaly, no distention.  Skin: Intact without lesions or rashes.  Neurologic: Alert and oriented x 3.  Psych: Normal affect. Extremities: No clubbing or cyanosis.  HEENT: Normal.   ASSESSMENT & PLAN:  1. Chronic systolic CHF: Nonischemic cardiomyopathy by coronary angiography in 2010 and 7/19.  He has had a cardiomyopathy for at least the last 10 years.  Echo in 5/19 showed a fall in EF to the 25-30% range.  He does not have an ICD as prior echoes had shown EF in the 40% range.  It is possible that he has a LBBB cardiomyopathy.  Prior viral myocarditis is also a possibility. No significant FH of CHF and no heavy ETOH/substance abuse.  Cardiac MRI in 6/21 showed LV EF 46% with septal-lateral dyssynchrony, RV EF 51%, no LGE.  Echo in 8/22 and again in 7/23 showed EF 45% with septal-lateral dyssynchrony.  NYHA class I, not volume overloaded on  exam.  - He does not appear to need standing Lasix.  - Continue current Entresto, spironolactone, and dapagliflozin (all at goal).  - Continue Coreg 6.25 mg bid, will not increase with mild bradycardia.  - BMET/BNP today.    - EF has been out of CRT-D range.  I will arrange for repeat echo.   2. HTN: BP controlled.  3. LBBB: Chronic, unchanged.  4. Obesity: Body mass index is 39.29 kg/m.  - Encouraged weight loss by diet/exercise.  - He did not tolerate semaglutide witih profuse diarrhea.  5. OSA: Continue CPAP.  6. Hyperlipidemia: ?Myalgias related to simvastatin.  - Continue Crestor, check lipids today.   Followup in 4 months with echo  Marca Ancona 10/30/2022

## 2022-10-30 NOTE — Patient Instructions (Signed)
There has been no changes to your medications.  Labs done today, your results will be available in MyChart, we will contact you for abnormal readings.  Your physician has requested that you have an echocardiogram. Echocardiography is a painless test that uses sound waves to create images of your heart. It provides your doctor with information about the size and shape of your heart and how well your heart's chambers and valves are working. This procedure takes approximately one hour. There are no restrictions for this procedure. Please do NOT wear cologne, perfume, aftershave, or lotions (deodorant is allowed). Please arrive 15 minutes prior to your appointment time.  Your physician recommends that you schedule a follow-up appointment in: 4 months (NOVEMBER) **PLEASE CALL THE OFFICE IN SEPTEMBER TO ARRANGE YOUR FOLLOW UP APPOINTMENT. **  If you have any questions or concerns before your next appointment please send [place] a message through [place] or call our office at [phone].    TO LEAVE A MESSAGE FOR THE NURSE SELECT OPTION 2, PLEASE LEAVE A MESSAGE INCLUDING: YOUR NAME DATE OF BIRTH CALL BACK NUMBER REASON FOR CALL**this is important as we prioritize the call backs  YOU WILL RECEIVE A CALL BACK THE SAME DAY AS LONG AS YOU CALL BEFORE 4:00 PM  At the [facility], you and your health needs are our priority. As part of our continuing mission to provide you with exceptional heart care, we have created designated Provider Care Teams. These Care Teams include your primary Cardiologist (physician) and Advanced Practice Providers (APPs- Physician Assistants and Nurse Practitioners) who all work together to provide you with the care you need, when you need it.   You may see any of the following providers on your designated Care Team at your next follow up: Dr Arvilla Meres Dr Marca Ancona Dr. Marcos Eke, NP Robbie Lis, [place] [facility] Soph[place], [place] Brynda Peon, NP Karle Plumber, PharmD   Please be sure to bring in all your medications bottles to every appointment.    Thank you for choosing [facility]

## 2022-11-06 ENCOUNTER — Other Ambulatory Visit: Payer: Self-pay | Admitting: Gastroenterology

## 2022-11-17 DIAGNOSIS — G4733 Obstructive sleep apnea (adult) (pediatric): Secondary | ICD-10-CM | POA: Diagnosis not present

## 2022-11-19 ENCOUNTER — Other Ambulatory Visit (HOSPITAL_COMMUNITY): Payer: Self-pay | Admitting: Cardiology

## 2022-11-21 DIAGNOSIS — E538 Deficiency of other specified B group vitamins: Secondary | ICD-10-CM | POA: Diagnosis not present

## 2022-11-26 ENCOUNTER — Other Ambulatory Visit (HOSPITAL_COMMUNITY): Payer: Self-pay | Admitting: Family Medicine

## 2022-12-03 ENCOUNTER — Ambulatory Visit: Payer: 59 | Admitting: Gastroenterology

## 2022-12-16 ENCOUNTER — Encounter: Payer: Self-pay | Admitting: Gastroenterology

## 2022-12-16 ENCOUNTER — Ambulatory Visit (INDEPENDENT_AMBULATORY_CARE_PROVIDER_SITE_OTHER): Payer: 59 | Admitting: Gastroenterology

## 2022-12-16 VITALS — BP 100/54 | HR 60 | Ht 69.5 in | Wt 276.1 lb

## 2022-12-16 DIAGNOSIS — Z86010 Personal history of colonic polyps: Secondary | ICD-10-CM

## 2022-12-16 DIAGNOSIS — R140 Abdominal distension (gaseous): Secondary | ICD-10-CM

## 2022-12-16 DIAGNOSIS — K529 Noninfective gastroenteritis and colitis, unspecified: Secondary | ICD-10-CM

## 2022-12-16 DIAGNOSIS — R143 Flatulence: Secondary | ICD-10-CM

## 2022-12-16 MED ORDER — NA SULFATE-K SULFATE-MG SULF 17.5-3.13-1.6 GM/177ML PO SOLN: 1.0000 | Freq: Once | ORAL | 0 refills | Status: AC

## 2022-12-16 NOTE — Addendum Note (Signed)
Addended by: Alexis Frock on: 12/16/2022 01:53 PM   Modules accepted: Orders

## 2022-12-16 NOTE — Progress Notes (Signed)
[facility] Progress Note  Chief Complaint: Bile acid diarrhea  Subjective  History: Follow-up from most recent office visit 09/02/2022  History of chronic diarrhea believed largely due to bile acid malabsorption and possibly a component of maldigestion.  Cholecystectomy 2019 with bile leak, subsequent ERCP with sphincterotomy and stent placement. Negative celiac testing with TTG antibody and total IgA level March 2023 Normal fecal elastase March 2023 when the C. difficile was discovered and treated with vancomycin.   Surveillance colonoscopy December 2019 for history of adenomatous and hyperplastic polyps in 2014:   Diminutive SSP removed, 5-year recall recommended. _______________________________ Negative stool infectious testing including C. difficile done by primary care before the May 2024 visit with me.  Patient started back on WelChol 1250 mg once daily about midday (away from his other medicines).  Nicholas Gray reports that this dose of WelChol definitely decreases the amount of diarrhea, which he can tell if he misses it for a day.  However, despite taking it he has intermittent loose stool with cramps bloating and excess flatus.  If the diarrhea really acts up, then his internal hemorrhoids will flare as well with some burning and small-volume bleeding.  He knows that onions worsen his gas and bloating, but he had not find any other clear food triggers trying a FODMAP diet.  ROS: Cardiovascular:  no chest pain Respiratory: no dyspnea  The patient's Past Medical, Family and Social History were reviewed and are on file in the EMR.  Objective:  Med list reviewed  Current Outpatient Medications:    allopurinol (ZYLOPRIM) 100 MG tablet, Take 100 mg by mouth 2 (two) times daily. , Disp: , Rfl:    Calcium Carb-Cholecalciferol (CALCIUM 600 + D PO), Take 1 tablet by mouth 2 (two) times daily., Disp: , Rfl:    carvedilol (COREG) 6.25 MG tablet, TAKE 1 TABLET BY MOUTH TWICE DAILY  WITH A MEAL., Disp: 60 tablet, Rfl: 3   Cholecalciferol (VITAMIN D3) 125 MCG (5000 UT) TABS, Take 1 tablet by mouth in the morning and at bedtime., Disp: , Rfl:    colestipol (COLESTID) 1 g tablet, TAKE (2) TABLETS BY MOUTH ONCE DAILY., Disp: 60 tablet, Rfl: 3   Cyanocobalamin (B-12 COMPLIANCE INJECTION IJ), Inject 1 Application as directed every 28 (twenty-eight) days., Disp: , Rfl:    empagliflozin (JARDIANCE) 10 MG TABS tablet, Take 1 tablet (10 mg total) by mouth daily before breakfast., Disp: 30 tablet, Rfl: 11   furosemide (LASIX) 20 MG tablet, Take 1 tablet (20 mg total) by mouth as needed for fluid or edema., Disp: 30 tablet, Rfl: 3   hydrocortisone (ANUSOL-HC) 2.5 % rectal cream, Place 1 application. rectally 2 (two) times daily as needed for hemorrhoids., Disp: 60 g, Rfl: 1   levothyroxine (SYNTHROID) 50 MCG tablet, Take 50 mcg by mouth daily., Disp: , Rfl:    Magnesium Oxide 400 MG CAPS, Take 1 capsule (400 mg total) by mouth in the morning and at bedtime., Disp: 30 capsule, Rfl: 6   pantoprazole (PROTONIX) 40 MG tablet, Take 1 tablet (40 mg total) by mouth daily., Disp: 30 tablet, Rfl: 1   potassium chloride SA (KLOR-CON M) 20 MEQ tablet, TAKE 1 TABLET BY MOUTH TWICE DAILY, Disp: 180 tablet, Rfl: 3   rosuvastatin (CRESTOR) 10 MG tablet, TAKE 1 TABLET BY MOUTH ONCE A DAY., Disp: 90 tablet, Rfl: 3   sacubitril-valsartan (ENTRESTO) 97-103 MG, TAKE 1 TABLET BY MOUTH TWICE DAILY., Disp: 60 tablet, Rfl: 6   spironolactone (ALDACTONE) 25  MG tablet, TAKE 1 TABLET BY MOUTH DAILY., Disp: 90 tablet, Rfl: 3   Vital signs in last 24 hrs: Vitals:   12/16/22 1312  BP: (!) 100/54  Pulse: 60   Wt Readings from Last 3 Encounters:  12/16/22 276 lb 2 oz (125.2 kg)  10/30/22 273 lb 12.8 oz (124.2 kg)  09/02/22 273 lb (123.8 kg)    Physical Exam  Well-appearing Cardiac: Regular without appreciable murmur,  no peripheral edema Pulm: clear to auscultation bilaterally, normal RR and effort  noted Abdomen: soft, obese, no tenderness, with active bowel sounds.  Difficult to assess hepatosplenomegaly or mass due to body habitus Skin; warm and dry, no jaundice or rash  Labs:   ___________________________________________ Radiologic studies:   ____________________________________________ Other:   _____________________________________________ Assessment & Plan  Assessment: Encounter Diagnoses  Name Primary?   Chronic diarrhea Yes   Abdominal bloating    Flatulence    Personal history of colonic polyps    Chronic diarrhea, bloating and gas that seems at least partially bile acid diarrhea in a patient with prior cholecystectomy and ERCP with sphincterotomy.  He has breakthrough symptoms may be because we have him on a relatively low dose of WelChol, though he agrees it would not be feasible to try it twice a day due to the timing around his other medicines. Possibly superimposed IBS, SIBO, microscopic colitis.  He is due for surveillance colonoscopy, which he agreed to get scheduled today.  On that exam, I will also biopsy for microscopic colitis.  If that is negative, I will most likely give him an empiric course of treatment for possible SIBO (rifaximin if not cost prohibitive, metronidazole as second line choice).  If that is done and not helpful, trial of antispasmodic agent.  He was agreeable to the colonoscopy after discussion of procedure and risks.  The benefits and risks of the planned procedure were described in detail with the patient or (when appropriate) their health care proxy.  Risks were outlined as including, but not limited to, bleeding, infection, perforation, adverse medication reaction leading to cardiac or pulmonary decompensation, pancreatitis (if ERCP).  The limitation of incomplete mucosal visualization was also discussed.  No guarantees or warranties were given.   Nicholas Gray

## 2022-12-16 NOTE — Patient Instructions (Addendum)
_______________________________________________________  If your blood pressure at your visit was 140/90 or greater, please contact your primary care physician to follow up on this.  _______________________________________________________  If you are age 63 or older, your body mass index should be between 23-30. Your Body mass index is 40.19 kg/m. If this is out of the aforementioned range listed, please consider follow up with your Primary Care Provider.  If you are age 34 or younger, your body mass index should be between 19-25. Your Body mass index is 40.19 kg/m. If this is out of the aformentioned range listed, please consider follow up with your Primary Care Provider.   ________________________________________________________  The [facility] providers would like to encourage you to use [facility] to communicate with providers for non-urgent requests or questions.  Due to long hold times on the telephone, sending your provider a message by [facility] may be a faster and more efficient way to get a response.  Please allow 48 business hours for a response.  Please remember that this is for non-urgent requests.  _______________________________________________________  Bonita Quin have been scheduled for a colonoscopy. Please follow written instructions given to you at your visit today.   Please pick up your prep supplies at the pharmacy within the next 1-3 days.  If you use inhalers (even only as needed), please bring them with you on the day of your procedure.  DO NOT TAKE 7 DAYS PRIOR TO TEST- Trulicity (dulaglutide) Ozempic, Wegovy (semaglutide) Mounjaro (tirzepatide) Bydureon Bcise (exanatide extended release)  DO NOT TAKE 1 DAY PRIOR TO YOUR TEST Rybelsus (semaglutide) Adlyxin (lixisenatide) Victoza (liraglutide) Byetta (exanatide) ___________________________________________________________________________  Due to recent changes in healthcare laws, you may see the results of your imaging  and laboratory studies on MyChart before your provider has had a chance to review them.  We understand that in some cases there may be results that are confusing or concerning to you. Not all laboratory results come back in the same time frame and the provider may be waiting for multiple results in order to interpret others.  Please give [place] 48 hours in order for your provider to thoroughly review all the results before contacting the office for clarification of your results.    It was a pleasure to see you today!  Thank you for trusting me with your gastrointestinal care!

## 2022-12-18 DIAGNOSIS — G4733 Obstructive sleep apnea (adult) (pediatric): Secondary | ICD-10-CM | POA: Diagnosis not present

## 2022-12-19 DIAGNOSIS — E538 Deficiency of other specified B group vitamins: Secondary | ICD-10-CM | POA: Diagnosis not present

## 2022-12-24 ENCOUNTER — Encounter: Payer: Self-pay | Admitting: Gastroenterology

## 2023-01-01 DIAGNOSIS — I10 Essential (primary) hypertension: Secondary | ICD-10-CM | POA: Diagnosis not present

## 2023-01-01 DIAGNOSIS — E6609 Other obesity due to excess calories: Secondary | ICD-10-CM | POA: Diagnosis not present

## 2023-01-01 DIAGNOSIS — I5022 Chronic systolic (congestive) heart failure: Secondary | ICD-10-CM | POA: Diagnosis not present

## 2023-01-01 DIAGNOSIS — Z23 Encounter for immunization: Secondary | ICD-10-CM | POA: Diagnosis not present

## 2023-01-01 DIAGNOSIS — Z9229 Personal history of other drug therapy: Secondary | ICD-10-CM | POA: Diagnosis not present

## 2023-01-01 DIAGNOSIS — Z125 Encounter for screening for malignant neoplasm of prostate: Secondary | ICD-10-CM | POA: Diagnosis not present

## 2023-01-01 DIAGNOSIS — I428 Other cardiomyopathies: Secondary | ICD-10-CM | POA: Diagnosis not present

## 2023-01-01 DIAGNOSIS — K529 Noninfective gastroenteritis and colitis, unspecified: Secondary | ICD-10-CM | POA: Diagnosis not present

## 2023-01-01 DIAGNOSIS — Z0001 Encounter for general adult medical examination with abnormal findings: Secondary | ICD-10-CM | POA: Diagnosis not present

## 2023-01-01 DIAGNOSIS — I700 Atherosclerosis of aorta: Secondary | ICD-10-CM | POA: Diagnosis not present

## 2023-01-01 DIAGNOSIS — Z1331 Encounter for screening for depression: Secondary | ICD-10-CM | POA: Diagnosis not present

## 2023-01-01 DIAGNOSIS — Z6839 Body mass index (BMI) 39.0-39.9, adult: Secondary | ICD-10-CM | POA: Diagnosis not present

## 2023-01-06 ENCOUNTER — Encounter: Payer: Self-pay | Admitting: Gastroenterology

## 2023-01-06 ENCOUNTER — Ambulatory Visit (AMBULATORY_SURGERY_CENTER): Payer: 59 | Admitting: Gastroenterology

## 2023-01-06 VITALS — BP 110/66 | HR 71 | Temp 97.3°F | Resp 10 | Ht 69.5 in | Wt 276.0 lb

## 2023-01-06 DIAGNOSIS — I10 Essential (primary) hypertension: Secondary | ICD-10-CM | POA: Diagnosis not present

## 2023-01-06 DIAGNOSIS — Z860100 Personal history of colon polyps, unspecified: Secondary | ICD-10-CM | POA: Diagnosis not present

## 2023-01-06 DIAGNOSIS — Z09 Encounter for follow-up examination after completed treatment for conditions other than malignant neoplasm: Secondary | ICD-10-CM

## 2023-01-06 DIAGNOSIS — R197 Diarrhea, unspecified: Secondary | ICD-10-CM | POA: Diagnosis not present

## 2023-01-06 DIAGNOSIS — Z1211 Encounter for screening for malignant neoplasm of colon: Secondary | ICD-10-CM | POA: Diagnosis not present

## 2023-01-06 DIAGNOSIS — G4733 Obstructive sleep apnea (adult) (pediatric): Secondary | ICD-10-CM | POA: Diagnosis not present

## 2023-01-06 DIAGNOSIS — Z860101 Personal history of adenomatous and serrated colon polyps: Secondary | ICD-10-CM | POA: Diagnosis not present

## 2023-01-06 MED ORDER — SODIUM CHLORIDE 0.9 % IV SOLN: 500.0000 mL | Freq: Once | INTRAVENOUS | Status: DC

## 2023-01-06 NOTE — Op Note (Signed)
[facility] Patient Name: Nicholas Gray Procedure Date: 01/06/2023 2:13 PM MRN: [id number] Endoscopist: Sherilyn Cooter L. Myrtie Neither , MD, [id number] Age: 62 Referring MD:  Date of Birth: 1961-02-05 Gender: Male Account #: [id number] Procedure:                Colonoscopy Indications:              Increased risk colon polyp surveillance: Personal                            history of sessile serrated colon polyp (less than                            10 mm in size) with no dysplasia,                           Diminutive SSP without dysplasia December 2019;                            subcentimeter tubular adenoma and hyperplastic                            polyp 2014                           Incidental diarrhea noted Medicines:                Monitored Anesthesia Care Procedure:                Pre-Anesthesia Assessment:                           - Prior to the procedure, a History and Physical                            was performed, and patient medications and                            allergies were reviewed. The patient's tolerance of                            previous anesthesia was also reviewed. The risks                            and benefits of the procedure and the sedation                            options and risks were discussed with the patient.                            All questions were answered, and informed consent                            was obtained. Prior Anticoagulants: The patient has  taken no anticoagulant or antiplatelet agents. ASA                            Grade Assessment: III - A patient with severe                            systemic disease. After reviewing the risks and                            benefits, the patient was deemed in satisfactory                            condition to undergo the procedure.                           After obtaining informed consent, the colonoscope                            was passed  under direct vision. Throughout the                            procedure, the patient's blood pressure, pulse, and                            oxygen saturations were monitored continuously. The                            Olympus Scope [id number] was introduced through the                            anus and advanced to the the terminal ileum, with                            identification of the appendiceal orifice and IC                            valve. The colonoscopy was somewhat difficult due                            to a redundant colon. Successful completion of the                            procedure was aided by using manual pressure and                            straightening and shortening the scope to obtain                            bowel loop reduction. The patient tolerated the                            procedure well. The quality of the bowel  preparation was excellent. The terminal ileum,                            ileocecal valve, appendiceal orifice, and rectum                            were photographed. Scope In: 2:17:00 PM Scope Out: 2:32:13 PM Scope Withdrawal Time: 0 hours 9 minutes 21 seconds  Total Procedure Duration: 0 hours 15 minutes 13 seconds  Findings:                 The perianal and digital rectal examinations were                            normal.                           The terminal ileum appeared normal.                           Normal mucosa was found in the entire colon.                            Biopsies for histology were taken with a cold                            forceps from the ascending colon, transverse colon                            and descending colon for evaluation of microscopic                            colitis.                           Repeat examination of right colon under NBI                            performed.                           Diverticula were found in the left colon.                            Internal hemorrhoids were found. The hemorrhoids                            were medium-sized.                           The exam was otherwise without abnormality on                            direct and retroflexion views. Complications:            No immediate complications. Estimated Blood Loss:     Estimated blood loss was minimal. Impression:               -  The examined portion of the ileum was normal.                           - Normal mucosa in the entire examined colon.                            Biopsied.                           - Diverticulosis in the left colon.                           - Internal hemorrhoids.                           - The examination was otherwise normal on direct                            and retroflexion views. Recommendation:           - Patient has a contact number available for                            emergencies. The signs and symptoms of potential                            delayed complications were discussed with the                            patient. Return to normal activities tomorrow.                            Written discharge instructions were provided to the                            patient.                           - Resume previous diet.                           - Continue present medications.                           - Await pathology results.                           - Repeat colonoscopy in 10 years for surveillance. Ciella Obi L. Myrtie Neither, MD 01/06/2023 2:39:27 PM This report has been signed electronically.

## 2023-01-06 NOTE — Progress Notes (Signed)
Pt's states no medical or surgical changes since previsit or office visit. 

## 2023-01-06 NOTE — Patient Instructions (Signed)
Resume previous diet Continue present medications Await pathology results There were no colon polyps seen today!  You will need another screening colonoscopy in 10 years, you will receive a letter at that time when you are due for the procedure.   Please call us at [phone] if you have a change in bowel habits, change in family history of colo-rectal cancer, rectal bleeding or other GI concern before that time.  Handouts/information given for diverticulosis and hemorrhoids  YOU HAD AN ENDOSCOPIC PROCEDURE TODAY AT THE [facility]:   Refer to the procedure report that was given to you for any specific questions about what was found during the examination.  If the procedure report does not answer your questions, please call your gastroenterologist to clarify.  If you requested that your care partner not be given the details of your procedure findings, then the procedure report has been included in a sealed envelope for you to review at your convenience later.  YOU SHOULD EXPECT: Some feelings of bloating in the abdomen. Passage of more gas than usual.  Walking can help get rid of the air that was put into your GI tract during the procedure and reduce the bloating. If you had a lower endoscopy (such as a colonoscopy or flexible sigmoidoscopy) you may notice spotting of blood in your stool or on the toilet paper. If you underwent a bowel prep for your procedure, you may not have a normal bowel movement for a few days.  Please Note:  You might notice some irritation and congestion in your nose or some drainage.  This is from the oxygen used during your procedure.  There is no need for concern and it should clear up in a day or so.  SYMPTOMS TO REPORT IMMEDIATELY:  Following lower endoscopy (colonoscopy):  Excessive amounts of blood in the stool  Significant tenderness or worsening of abdominal pains  Swelling of the abdomen that is new, acute  Fever of 100F or higher  For urgent  or emergent issues, a gastroenterologist can be reached at any hour by calling [phone]. Do not use MyChart messaging for urgent concerns.   DIET:  We do recommend a small meal at first, but then you may proceed to your regular diet.  Drink plenty of fluids but you should avoid alcoholic beverages for 24 hours.  ACTIVITY:  You should plan to take it easy for the rest of today and you should NOT DRIVE or use heavy machinery until tomorrow (because of the sedation medicines used during the test).    FOLLOW UP: Our staff will call the number listed on your records the next business day following your procedure.  We will call around 7:15- 8:00 am to check on you and address any questions or concerns that you may have regarding the information given to you following your procedure. If we do not reach you, we will leave a message.     If any biopsies were taken you will be contacted by phone or by letter within the next 1-3 weeks.  Please call us at [phone] if you have not heard about the biopsies in 3 weeks.   SIGNATURES/CONFIDENTIALITY: You and/or your care partner have signed paperwork which will be entered into your electronic medical record.  These signatures attest to the fact that that the information above on your After Visit Summary has been reviewed and is understood.  Full responsibility of the confidentiality of this discharge information lies with you and/or your  care-partner.

## 2023-01-06 NOTE — Progress Notes (Signed)
No changes to clinical history since GI office visit on 12/16/22. No significant changes since then.  The patient is appropriate for an endoscopic procedure in the ambulatory setting.  - Amada Jupiter, MD

## 2023-01-06 NOTE — Progress Notes (Signed)
Sedate, gd SR, tolerated procedure well, VSS, report to RN 

## 2023-01-07 ENCOUNTER — Other Ambulatory Visit (HOSPITAL_COMMUNITY): Payer: Self-pay | Admitting: Cardiology

## 2023-01-07 ENCOUNTER — Telehealth: Payer: Self-pay

## 2023-01-07 NOTE — Telephone Encounter (Signed)
  Follow up Call-     01/06/2023    1:18 PM  Call back number  Post procedure Call Back phone  # [phone]  Permission to leave phone message Yes     Patient questions:  Do you have a fever, pain , or abdominal swelling? No. Pain Score  0 *  Have you tolerated food without any problems? Yes.    Have you been able to return to your normal activities? Yes.    Do you have any questions about your discharge instructions: Diet   No. Medications  No. Follow up visit  No.  Do you have questions or concerns about your Care? No.  Actions: * If pain score is 4 or above: No action needed, pain <4.

## 2023-01-09 LAB — SURGICAL PATHOLOGY

## 2023-01-16 ENCOUNTER — Other Ambulatory Visit: Payer: Self-pay

## 2023-01-16 DIAGNOSIS — E538 Deficiency of other specified B group vitamins: Secondary | ICD-10-CM | POA: Diagnosis not present

## 2023-01-16 MED ORDER — RIFAXIMIN 550 MG PO TABS: 550.0000 mg | ORAL_TABLET | Freq: Two times a day (BID) | ORAL | 0 refills | Status: AC

## 2023-01-17 DIAGNOSIS — G4733 Obstructive sleep apnea (adult) (pediatric): Secondary | ICD-10-CM | POA: Diagnosis not present

## 2023-01-21 ENCOUNTER — Telehealth: Payer: Self-pay | Admitting: Pharmacy Technician

## 2023-01-21 ENCOUNTER — Other Ambulatory Visit (HOSPITAL_COMMUNITY): Payer: Self-pay

## 2023-01-21 NOTE — Telephone Encounter (Signed)
Pharmacy Patient Advocate Encounter   Received notification from Fax that prior authorization for COLESEVELAM 625MG  is required/requested.   Insurance verification completed.   The patient is insured through [employer] .   Per test claim: PA required; PA submitted to [employer] via CoverMyMeds Key/confirmation #/EOC [id number] Status is pending

## 2023-01-21 NOTE — Telephone Encounter (Signed)
Pharmacy Patient Advocate Encounter   Received notification from Fax that prior authorization for XIFAXAN 550MG  is required/requested.   Insurance verification completed.   The patient is insured through [employer] .   Per test claim: PA required; PA submitted to [employer] via CoverMyMeds Key/confirmation #/EOC [id number] Status is pending

## 2023-01-21 NOTE — Telephone Encounter (Signed)
Patient was switched to Colestipol. Nothing else needed at this time.

## 2023-01-21 NOTE — Telephone Encounter (Signed)
Pharmacy Patient Advocate Encounter  Received notification from [facility] that Prior Authorization for [facility] 550MG  has been APPROVED from 10.16.24 to 10.30.24. Ran test claim, Copay is $0. This test claim was processed through [facility]- copay amounts may vary at other pharmacies due to pharmacy/plan contracts, or as the patient moves through the different stages of their insurance plan.   PA #/Case ID/Reference #:

## 2023-02-12 DIAGNOSIS — E538 Deficiency of other specified B group vitamins: Secondary | ICD-10-CM | POA: Diagnosis not present

## 2023-02-26 ENCOUNTER — Encounter (HOSPITAL_COMMUNITY): Payer: Self-pay | Admitting: Cardiology

## 2023-02-26 ENCOUNTER — Ambulatory Visit (HOSPITAL_BASED_OUTPATIENT_CLINIC_OR_DEPARTMENT_OTHER)
Admission: RE | Admit: 2023-02-26 | Discharge: 2023-02-26 | Disposition: A | Discharge status: Home / Self Care | Payer: 59 | Source: Ambulatory Visit | Attending: Cardiology | Admitting: Cardiology

## 2023-02-26 ENCOUNTER — Ambulatory Visit (HOSPITAL_COMMUNITY)
Admission: RE | Admit: 2023-02-26 | Discharge: 2023-02-26 | Disposition: A | Discharge status: Home / Self Care | Payer: 59 | Source: Ambulatory Visit | Attending: Cardiology | Admitting: Cardiology

## 2023-02-26 ENCOUNTER — Other Ambulatory Visit (HOSPITAL_COMMUNITY): Payer: Self-pay

## 2023-02-26 VITALS — BP 104/68 | HR 52 | Wt 280.0 lb

## 2023-02-26 DIAGNOSIS — I110 Hypertensive heart disease with heart failure: Secondary | ICD-10-CM | POA: Diagnosis not present

## 2023-02-26 DIAGNOSIS — R0602 Shortness of breath: Secondary | ICD-10-CM | POA: Diagnosis not present

## 2023-02-26 DIAGNOSIS — Z6841 Body Mass Index (BMI) 40.0 and over, adult: Secondary | ICD-10-CM | POA: Insufficient documentation

## 2023-02-26 DIAGNOSIS — I447 Left bundle-branch block, unspecified: Secondary | ICD-10-CM | POA: Insufficient documentation

## 2023-02-26 DIAGNOSIS — G4733 Obstructive sleep apnea (adult) (pediatric): Secondary | ICD-10-CM | POA: Diagnosis not present

## 2023-02-26 DIAGNOSIS — Z87891 Personal history of nicotine dependence: Secondary | ICD-10-CM | POA: Diagnosis not present

## 2023-02-26 DIAGNOSIS — Z7182 Exercise counseling: Secondary | ICD-10-CM | POA: Diagnosis not present

## 2023-02-26 DIAGNOSIS — Z79899 Other long term (current) drug therapy: Secondary | ICD-10-CM | POA: Diagnosis not present

## 2023-02-26 DIAGNOSIS — I5022 Chronic systolic (congestive) heart failure: Secondary | ICD-10-CM

## 2023-02-26 DIAGNOSIS — E669 Obesity, unspecified: Secondary | ICD-10-CM | POA: Insufficient documentation

## 2023-02-26 DIAGNOSIS — I428 Other cardiomyopathies: Secondary | ICD-10-CM | POA: Insufficient documentation

## 2023-02-26 LAB — BASIC METABOLIC PANEL
Anion gap: 8 (ref 5–15)
BUN: 14 mg/dL (ref 8–23)
CO2: 21 mmol/L — ABNORMAL LOW (ref 22–32)
Calcium: 9.1 mg/dL (ref 8.9–10.3)
Chloride: 104 mmol/L (ref 98–111)
Creatinine, Ser: 1.19 mg/dL (ref 0.61–1.24)
GFR, Estimated: >60 mL/min (ref >60)
Glucose, Bld: 97 mg/dL (ref 70–99)
Potassium: 4.5 mmol/L (ref 3.5–5.1)
Sodium: 133 mmol/L — ABNORMAL LOW (ref 135–145)

## 2023-02-26 LAB — ECHOCARDIOGRAM COMPLETE
AR max vel: 2.53 cm2
AV Area VTI: 2.72 cm2
AV Area mean vel: 2.66 cm2
AV Mean grad: 4 mm[Hg]
AV Peak grad: 8.9 mm[Hg]
Ao pk vel: 1.49 m/s
Area-P 1/2: 3.27 cm2
Calc EF: 48.7 %
S' Lateral: 3.8 cm
Single Plane A2C EF: 47.9 %
Single Plane A4C EF: 48.4 %

## 2023-02-26 LAB — BRAIN NATRIURETIC PEPTIDE: B Natriuretic Peptide: 11.5 pg/mL (ref 0.0–100.0)

## 2023-02-26 NOTE — Progress Notes (Signed)
Advanced [facility] Failure Clinic Note   PCP: Elfredia Nevins, MD PCP-Cardiologist: Rollene Rotunda, MD  HF Cardiology: Dr. Shirlee Latch  HPI:  Nicholas Gray is a 62 y.o. male with chronic systolic CHF, NICM (Normal coronaries 2010), HTN, LBBB, Obesity, and OSA on CPAP.  He has been known to have a cardiomyopathy for the last 10 years or so.   Seen in [facility] office 08/13/17 and was overall feeling well. No new symptoms.  Echo 08/27/17 LVEF 25-30%, Grade 1 DD, Mild MR, Mild LAE, mild RV dilation, PA peak pressure 34 mm Hg.  Referred by Dr. Antoine Poche to CHF team for further evaluation and treatment of CHF.    Pt admitted 5/30 - 09/13/2017 with Chest/upper abdominal discomfort and pain. CT scan and HIDA consistent with acute cholecystitis. Underwent laproscopic chole on 09/07/17 and found to have gangrenous GB. 09/09/17 repeat HIDA scan showed bile leak. ERCP 09/11/17, but patient had failed stent placement. Considered for IR for drain placement, but decided on conservative management.  He was admitted again with RUQ pain in 7/19, had ERCP with bile leak and choledocholithiasis noted.  He had stone removal and stent placed.   LHC/RHC in 7/19 showed no significant CAD, optimized filling pressures and preserved cardiac output. Cardiac MRI in 11/19 showed improvement in LV EF to 51% with mildly decreased RV systolic function, no LGE.   Echo in 5/21 showed EF in the range of 40% though it is a technically difficult study.  There is septal-lateral dyssynchrony. Cardiac MRI in 6/21 showed LV EF 46% with septal-lateral dyssynchrony, RV EF 51%, no LGE.  Echo in 8/22 showed EF 45% with mild diffuse hypokinesis and septal-lateral dyssynchrony, normal RV.   Patient started Ozempic for weight loss.  He lost weight but developed profuse diarrhea and actually ended up in the hospital with pneumatosis intestinalis.  He is now off Ozempic.   Echo in 7/23 showed EF 45%, septal-lateral dyssynchrony, normal RV systolic function with  mild RV enlargement, IVC normal.   Echo was done today and reviewed, EF 45% with septal-lateral dyssynchrony and normal RV.   He returns today for followup of CHF.  Weight is up 7 lbs. He is short of breath with heavy exertion such as riding a bike up a hill.  No problem walking up stairs.  He is excited about hunting season.  No lightheadedness or syncope.  No chest pain.  Using CPAP regularly.   ECG (personally reviewed): NSR, LBBB 152 msec  Labs (6/19): K 4.2, creatinine 1.14 Labs (8/19): K 3.9, creatinine 1.14, normal LFTs Labs (01/05/2018): K 4.5 creatinine 1.23  Labs (11/19): K 3.9, creatinine 1.16 Labs (1/20): K 3.9, creatinine 1.13 Labs (3/21): K 4.2, creatinine 1.28, hgb 13.7, LDL 57, HDL 28 Labs (5/21): K 4.2, creatinine 1.33 Labs (11/21): K 4.1, creatinine 1.43 Labs (1/22): LDL 48 Labs (4/22): K 3.8, creatinine 1.16 Labs (10/22): LDL 45 Labs (12/22): Mg 1.8, K 3.9, creatinine 1.02 Labs (3/23): K 4.5, creatinine 1.5 Labs (7/23): LDL 49, K 4.5, creatinine 1.61 Labs (1/24): K 4.4, creatinine 1.36 Labs (7/24): LDL 27, TGs 254, BNP 23, K 4.6, creatinine 1.43  Social History: Lives in [place], [place].  He is a former smoker, quit in 1982. Denies heavy ETOH use.  Family history: He denies immediate family history of CHF, but thinks his maternal grandparents may have had "heart problems"   Review of systems complete and found to be negative unless listed in HPI.    Past Medical History: 1. Chronic systolic  CHF: Suspected nonischemic cardiomyopathy (normal coronaries 2010).  - Echo 2012 with EF 30% - Echo 12/15 with EF 40-45% - Echo 2017 with EF 35-40% - Echo 08/27/17 LVEF 25-30%, Grade 1 DD, Mild MR, Mild LAE, mild RV dilation, PA peak pressure 34 mm Hg.  - LHC/RHC (7/19): No significant coronary disease. RA 7, PA 39/12 mean 25, PCWP mean 11, CI 3.68, PVR 1.65 WU.  - Cardiac MRI (11/19): LV EF 51% with septal-lateral dyssynchrony, mild RV dilation with RV EF 39%, no LGE.  - Echo  (5/21): Technically difficult, EF around 40% with diffuse hypokinesis and septal-lateral dyssynchrony, normal RV.  - Cardiac MRI (6/21): LV EF 46% with septal-lateral dyssynchrony, RV EF 51%, no LGE.  - Echo (8/22): EF 45% with mild diffuse hypokinesis and septal-lateral dyssynchrony, normal RV.  - Echo (7/23): EF 45%, septal-lateral dyssynchrony, normal RV systolic function with mild RV enlargement, IVC normal.   - Echo (11/24): EF 45% with septal-lateral dyssynchrony and normal RV. 2. HTN 3. LBBB, chronic 4. Obesity Body mass index is 40.76 kg/m. 5. OSA: Reports compliance with his CPAP.  6. GERD 7. Cholecystitis: Underwent laproscopic chole on 09/07/17 and found to have gangrenous GB.  - 09/09/17 repeat HIDA scan showed bile leak.  - ERCP 09/11/17, but patient had failed stent placement. - ERCP 7/19 with sphincterotomy, stone removal, stent placement.   Current Outpatient Medications  Medication Sig Dispense Refill   allopurinol (ZYLOPRIM) 100 MG tablet Take 100 mg by mouth 2 (two) times daily.      Calcium Carb-Cholecalciferol (CALCIUM 600 + D PO) Take 1 tablet by mouth 2 (two) times daily.     carvedilol (COREG) 6.25 MG tablet TAKE 1 TABLET BY MOUTH TWICE DAILY WITH A MEAL. 60 tablet 3   Cholecalciferol (VITAMIN D3) 125 MCG (5000 UT) TABS Take 1 tablet by mouth in the morning and at bedtime.     colestipol (COLESTID) 1 g tablet TAKE (2) TABLETS BY MOUTH ONCE DAILY. 60 tablet 3   Cyanocobalamin (B-12 COMPLIANCE INJECTION IJ) Inject 1 Application as directed every 28 (twenty-eight) days.     empagliflozin (JARDIANCE) 10 MG TABS tablet Take 1 tablet (10 mg total) by mouth daily before breakfast. 30 tablet 11   furosemide (LASIX) 20 MG tablet Take 1 tablet (20 mg total) by mouth as needed for fluid or edema. 30 tablet 3   hydrocortisone (ANUSOL-HC) 2.5 % rectal cream Place 1 application. rectally 2 (two) times daily as needed for hemorrhoids. 60 g 1   levothyroxine (SYNTHROID) 50 MCG tablet  Take 50 mcg by mouth daily.     Magnesium Oxide 400 MG CAPS Take 1 capsule (400 mg total) by mouth in the morning and at bedtime. 30 capsule 6   pantoprazole (PROTONIX) 40 MG tablet Take 1 tablet (40 mg total) by mouth daily. 30 tablet 1   potassium chloride SA (KLOR-CON M) 20 MEQ tablet TAKE 1 TABLET BY MOUTH TWICE DAILY 60 tablet 11   rosuvastatin (CRESTOR) 10 MG tablet TAKE 1 TABLET BY MOUTH ONCE A DAY. 90 tablet 3   sacubitril-valsartan (ENTRESTO) 97-103 MG TAKE 1 TABLET BY MOUTH TWICE DAILY. 60 tablet 6   spironolactone (ALDACTONE) 25 MG tablet TAKE 1 TABLET BY MOUTH DAILY. 90 tablet 3   No current facility-administered medications for this encounter.   Allergies  Allergen Reactions   Ozempic (0.25 Or 0.5 Mg-Dose) [Semaglutide(0.25 Or 0.5mg -Dos)]     GI upset on 0.25mg  dose    Vitals:   02/26/23 4098  BP: 104/68  Pulse: (!) 52  SpO2: 96%  Weight: 127 kg (280 lb)   Wt Readings from Last 3 Encounters:  02/26/23 127 kg (280 lb)  01/06/23 125.2 kg (276 lb)  12/16/22 125.2 kg (276 lb 2 oz)    PHYSICAL EXAM: General: NAD Neck: No JVD, no thyromegaly or thyroid nodule.  Lungs: Clear to auscultation bilaterally with normal respiratory effort. CV: Nondisplaced PMI.  Heart regular S1/S2, no S3/S4, no murmur.  No peripheral edema.  No carotid bruit.  Normal pedal pulses.  Abdomen: Soft, nontender, no hepatosplenomegaly, no distention.  Skin: Intact without lesions or rashes.  Neurologic: Alert and oriented x 3.  Psych: Normal affect. Extremities: No clubbing or cyanosis.  HEENT: Normal.   ASSESSMENT & PLAN:  1. Chronic systolic CHF: Nonischemic cardiomyopathy by coronary angiography in 2010 and 7/19.  He has had a cardiomyopathy for at least the last 10 years.  Echo in 5/19 showed a fall in EF to the 25-30% range.  He does not have an ICD as prior echoes had shown EF in the 40% range.  It is possible that he has a LBBB cardiomyopathy.  Prior viral myocarditis is also a  possibility. No significant FH of CHF and no heavy ETOH/substance abuse.  Cardiac MRI in 6/21 showed LV EF 46% with septal-lateral dyssynchrony, RV EF 51%, no LGE.  Echo in 8/22, 7/23, and 11/24 showed EF 45% with septal-lateral dyssynchrony.  NYHA class II, not volume overloaded on exam.  - He does not appear to need standing Lasix.  - Continue current Entresto, spironolactone, and dapagliflozin (all at goal).  - Continue Coreg 6.25 mg bid, will not increase with mild bradycardia.  - BMET/BNP today.  - EF has been out of CRT-D range.    2. HTN: BP controlled.  3. LBBB: Chronic, unchanged.  4. Obesity: Body mass index is 40.76 kg/m.  - Encouraged weight loss by diet/exercise.  - He did not tolerate semaglutide witih profuse diarrhea.  5. OSA: Continue CPAP.  6. Hyperlipidemia: ?Myalgias related to simvastatin.  - Continue Crestor, good lipids in 7/24.   Followup in 6 months but will need BMET in 3 months.   Marca Ancona 02/26/2023

## 2023-02-26 NOTE — Progress Notes (Signed)
  Echocardiogram 2D Echocardiogram has been performed.  Deryck Swopes Colena Ketterman 02/26/2023, 8:39 AM

## 2023-02-26 NOTE — Patient Instructions (Signed)
There has been no changes to your medications.  Labs done today, your results will be available in MyChart, we will contact you for abnormal readings.  Repeat blood work in 3 months.  Your physician recommends that you schedule a follow-up appointment in: 6 months (May 2025) ** PLEASE CALL THE OFFICE IN MARCH 2025 TO ARRANGE YOUR FOLLOW UP APPOINTMENT. **  If you have any questions or concerns before your next appointment please send [place] a message through [place] or call our office at [phone].    TO LEAVE A MESSAGE FOR THE NURSE SELECT OPTION 2, PLEASE LEAVE A MESSAGE INCLUDING: YOUR NAME DATE OF BIRTH CALL BACK NUMBER REASON FOR CALL**this is important as we prioritize the call backs  YOU WILL RECEIVE A CALL BACK THE SAME DAY AS LONG AS YOU CALL BEFORE 4:00 PM  At the [facility], you and your health needs are our priority. As part of our continuing mission to provide you with exceptional heart care, we have created designated Provider Care Teams. These Care Teams include your primary Cardiologist (physician) and Advanced Practice Providers (APPs- Physician Assistants and Nurse Practitioners) who all work together to provide you with the care you need, when you need it.   You may see any of the following providers on your designated Care Team at your next follow up: Dr Arvilla Meres Dr Marca Ancona Dr. Dorthula Nettles Dr. Clearnce Hasten Amy Filbert Schilder, NP Robbie Lis, [place] [facility] [place], [place] Brynda Peon, NP [place] Lee, NP Karle Plumber, PharmD   Please be sure to bring in all your medications bottles to every appointment.    Thank you for choosing [facility]

## 2023-03-13 ENCOUNTER — Other Ambulatory Visit (HOSPITAL_COMMUNITY): Payer: Self-pay | Admitting: Family Medicine

## 2023-03-13 DIAGNOSIS — E538 Deficiency of other specified B group vitamins: Secondary | ICD-10-CM | POA: Diagnosis not present

## 2023-04-02 ENCOUNTER — Other Ambulatory Visit (HOSPITAL_COMMUNITY): Payer: Self-pay | Admitting: Cardiology

## 2023-04-09 ENCOUNTER — Other Ambulatory Visit (HOSPITAL_COMMUNITY): Payer: Self-pay | Admitting: Family Medicine

## 2023-05-07 ENCOUNTER — Other Ambulatory Visit: Payer: Self-pay | Admitting: Gastroenterology

## 2023-05-15 LAB — BASIC METABOLIC PANEL
BUN/Creatinine Ratio: 11 (ref 10–24)
BUN: 17 mg/dL (ref 8–27)
CO2: 22 mmol/L (ref 20–29)
Calcium: 9.7 mg/dL (ref 8.6–10.2)
Chloride: 99 mmol/L (ref 96–106)
Creatinine, Ser: 1.48 mg/dL — ABNORMAL HIGH (ref 0.76–1.27)
Glucose: 97 mg/dL (ref 70–99)
Potassium: 5 mmol/L (ref 3.5–5.2)
Sodium: 137 mmol/L (ref 134–144)
eGFR: 53 mL/min/{1.73_m2} — ABNORMAL LOW (ref >59)

## 2023-05-20 ENCOUNTER — Other Ambulatory Visit (HOSPITAL_COMMUNITY): Payer: Self-pay | Admitting: Cardiology

## 2023-07-06 ENCOUNTER — Other Ambulatory Visit (HOSPITAL_COMMUNITY): Payer: Self-pay | Admitting: Cardiology

## 2023-08-24 ENCOUNTER — Ambulatory Visit: Payer: 59 | Attending: Cardiovascular Disease | Admitting: Cardiovascular Disease

## 2023-08-24 ENCOUNTER — Encounter: Payer: Self-pay | Admitting: Cardiovascular Disease

## 2023-08-24 DIAGNOSIS — I447 Left bundle-branch block, unspecified: Secondary | ICD-10-CM | POA: Diagnosis not present

## 2023-08-24 DIAGNOSIS — I10 Essential (primary) hypertension: Secondary | ICD-10-CM

## 2023-08-24 DIAGNOSIS — I428 Other cardiomyopathies: Secondary | ICD-10-CM

## 2023-08-24 DIAGNOSIS — E782 Mixed hyperlipidemia: Secondary | ICD-10-CM

## 2023-08-24 DIAGNOSIS — I5022 Chronic systolic (congestive) heart failure: Secondary | ICD-10-CM | POA: Diagnosis not present

## 2023-08-24 DIAGNOSIS — G4733 Obstructive sleep apnea (adult) (pediatric): Secondary | ICD-10-CM

## 2023-08-24 NOTE — Progress Notes (Signed)
 Cardiology Office Note    Date:  08/24/2023   ID:  WISE FEES, DOB 27-Dec-1960, MRN [id number]  PCP:  Kathyleen Parkins, MD  Cardiologist:  Magnus Schuller, MD (sleep); Dr. Lavonne Prairie, Dr. Mitzie Anda  13 month F/U Sleep Clinic evaluation.   History of Present Illness:  Nicholas Gray is a 63 y.o. male who is now  followed by Dr. Mitzie Anda for his cardiology care.  I last saw him in December 2023.  He presents for a follow-up sleep evaluation.  Nicholas Gray has a history of a nonischemic cardiomyopathy with an ejection fraction of 30-40% in 2010, and his most recent echo in September 2017 showed an EF of 35-40% with grade 1 diastolic dysfunction and moderate pulmonary hypertension.  He admits to recent increasing shortness of breath with uphill walking and notes mild ankle swelling.  He was initially referred for a sleep study in 2010 at [facility].  He had seen Dr. Thor Fling  on one occasion in February 2011.  He initially tried therapy, but abandoned this shortly thereafter, and never had follow-up evaluation.   He was ultimately referred back for a split night sleep study on 05/13/2015 due to symptoms of snoring, witnessed apnea, and nonrestorative sleep.  This demonstrated severe sleep apnea with an AHI of 57.4 per hour.  AHI during REM sleep was 49.4/h and with supine sleep was 78.1/h.  He had significant oxygen desaturation to 73% and time below 88% was 34.6 minutes.  CPAP titration was initiated.  During a split-night study, and a 12 cm water pressure was recommended. [facility] is his DME.  For some reason, after initiating CPAP therapy one year ago, he never presented to the sleep clinic for initial assessment.  However, since initiating CPAP therapy, he is sleeping better.  He is unaware of any breakthrough snoring.  I saw him for initial sleep evaluation and April 2018.  A download was obtained from 06/29/2016 through 07/28/2016.  He was meeting compliance standards with 100%  usage days and usage greater than 4 hours.  He is averaging 7 hours and 43 minutes of CPAP use per night.  AHI at 12 cwp is 4.4.  He has an air-fit F10, size small mask.  Further review of his download indicates that there were at least 3 nights where his AHI was >5/h with a peak up to 8.8.  Of note, he admits to a 30 pound weight gain over the past 6 months.  He is unaware of any breakthrough snoring.  His sleep is more restorative.  He is unaware of any parasomnias, or nocturnal palpitations. He denies daytime sleepiness, hypersomnolence, bruxism, restless legs, hypnogognic hallucinations, no cataplexy.  An Epworth Sleepiness Scale was recalculated in the office today which arteries against residual daytime sleepiness as shown below.  Epworth Sleepiness Scale: Situation   Chance of Dozing/Sleeping (0 = never , 1 = slight chance , 2 = moderate chance , 3 = high chance )   sitting and reading 2   watching TV 1   sitting inactive in a public place 0   being a passenger in a motor vehicle for an hour or more 0   lying down in the afternoon 3   sitting and talking to someone 0   sitting quietly after lunch (no alcohol) 1   while stopped for a few minutes in traffic as the driver 0   Total Score  7   I saw him in April 2019,  Mr. Tugwell was compliant with CPAP therapy.  He remained active and was working at least 6 days/week.  He has more energy since initiating CPAP.  He typically works 11 hours/day.  He is up at 4 AM and works between 6 AM and 5 PM.  He typically goes to bed at 8 PM and is asleep by 9 PM.  On Saturday he works from 6 AM until noon.  New download was obtained in the office from July 04, 2017 through August 02, 2017 showed 100% compliance with average usage 7 hours and 43 minutes of sleep per night.  His CPAP machine was set at 13 cm, AHI was 4.1.  He denied any residual daytime sleepiness.   He sees Dr. Mitzie Anda for advanced heart failure clinic care and has not seen Dr. Lavonne Prairie.  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  When I saw him on June 18, 2020 he was using CPAP with 100% compliance and denied residual daytime sleepiness.  Epworth scale endorsed at 5. A download from May 16, 2020 through June 14, 2020 which confirmsed100% compliance and average use at 8 hours and 17 minutes.  At 13 cm water pressure AHI is 3.3.  [facility] had [facility] which [facility] to [employer].  However he has not had gotten any supplies in some time.  He uses a full facemask.  I reviewed his app today.  There are some nights where his AHI is slightly above 5.  He typically goes to bed at 10 PM and wakes up at 6 AM.  He denied any residual snoring, daytime sleepiness, bruxism, hypnagogic hallucinations or cataplectic events.    I saw him on March 24, 2022 at which time he was continuing to use CPAP.   His blood pressure at home has been stable in the 100-110 systolic range on his regimen of carvedilol  6.25 mg twice a day, Entresto  97/103 mg twice a day, Farxiga  10 mg, furosemide  20 mg as needed in addition to spironolactone  25 mg daily.  He is on levothyroxine  for hypothyroidism and pantoprazole  for GERD.  His CPAP unit is from June 21, 2015.  He brought his machine with him today so we could obtain a download since his machine is no longer wireless with upgrade to [employer].  Usage is 100% with average use at 7 hours and 44 minutes.  AHI is 2.0 at his 14 cm set pressure.  He qualified for a new machine and needed to see me in a face-to-face evaluation.  I last saw him om July 21, 2022. Since his prior evaluation he was able to receive a new ResMed AirSense 10 AutoSet unit replacement machine on April 18, 2022.  [employer] is his DME company.  He notes significant improvement from his prior machine.  He believes he is sleeping well.  A download was obtained from January 12 of May 17, 2022 which shows 100% usage  with average use at 8 hours and 23 minutes.  Had a 14 cm set pressure, AHI is excellent at 2.5.  Subsequent download from March 14 through July 18, 2022 again shows excellent compliance.  AHI is 1.7 at 14 cm set pressure.  He has been using a F20 fullface mask.  He typically goes to bed at 10 PM and wakes up at 6 AM.  He believes he is sleeping well.  He denies residual daytime sleepiness of breakthrough snoring.  His blood  pressure has been well-controlled.    Since I last saw him, he has continued to be followed by Dr. Peder Bourdon the advanced heart failure clinic.  He underwent a 2D echo Doppler study on February 26, 2023 which showed EF at 40 to 45% and grade 1 diastolic dysfunction.  He had mildly elevated PA systolic pressure.  Valves were essentially normal.  He continues to use CPAP with excellent compliance.  A download was obtained from April 14 through Aug 18, 2023 which confirms 100% usage and average use at 8 hours and 13 minutes per evening.  His CPAP is set at a pressure range of 14 to 18 cm.  AHI is 2.0.  There is no mask leak with his F20 fullface mask.  An Epworth scale was calculated in the office today and this endorsed at 4 arguing against residual daytime sleepiness.  He presents for a 1 year follow-up evaluation.   Past Medical History:  Diagnosis Date   Arthritis    gout   Cardiomyopathy    EF 30-40% (Normal coronaries cath 2010).  Echo 2015 EF 40%   CHF (congestive heart failure) (HCC)    Colonic polyp    Diverticulosis of colon    GERD (gastroesophageal reflux disease)    History of chicken pox    HLD (hyperlipidemia)    recent   HTN (hypertension)    recent   Sleep apnea    does use CPAP.     Thyroid disease     Past Surgical History:  Procedure Laterality Date   BILIARY STENT PLACEMENT  11/08/2017   Procedure: BILIARY STENT PLACEMENT;  Surgeon: Alvis Jourdain, MD;  Location: [facility] ENDOSCOPY;  Service: Endoscopy;;   BIOPSY  03/08/2018   Procedure: BIOPSY;   Surgeon: Albertina Hugger, MD;  Location: [facility] ENDOSCOPY;  Service: Gastroenterology;;   CARDIAC CATHETERIZATION  2010   CHOLECYSTECTOMY N/A 09/07/2017   Procedure: LAPAROSCOPIC CHOLECYSTECTOMY;  Surgeon: Derral Flick, MD;  Location: [facility] OR;  Service: General;  Laterality: N/A;  LAPAROSCOPIC CHOLECYSTECTOMY   COLONOSCOPY WITH PROPOFOL  N/A 03/08/2018   Procedure: COLONOSCOPY WITH PROPOFOL ;  Surgeon: Albertina Hugger, MD;  Location: [facility] ENDOSCOPY;  Service: Gastroenterology;  Laterality: N/A;   ERCP N/A 09/11/2017   Procedure: ENDOSCOPIC RETROGRADE CHOLANGIOPANCREATOGRAPHY (ERCP);  Surgeon: Albertina Hugger, MD;  Location: [facility] ENDOSCOPY;  Service: Gastroenterology;  Laterality: N/A;   ERCP N/A 11/08/2017   Procedure: ENDOSCOPIC RETROGRADE CHOLANGIOPANCREATOGRAPHY (ERCP);  Surgeon: Alvis Jourdain, MD;  Location: [facility] ENDOSCOPY;  Service: Endoscopy;  Laterality: N/A;   ERCP N/A 01/11/2018   Procedure: ENDOSCOPIC RETROGRADE CHOLANGIOPANCREATOGRAPHY;  Surgeon: Albertina Hugger, MD;  Location: [facility] ENDOSCOPY;  Service: Gastroenterology;  Laterality: N/A;  With Balloon Sweep of Ducts   REMOVAL OF STONES  11/08/2017   Procedure: REMOVAL OF STONES;  Surgeon: Alvis Jourdain, MD;  Location: [facility] ENDOSCOPY;  Service: Endoscopy;;   RIGHT/LEFT HEART CATH AND CORONARY ANGIOGRAPHY N/A 10/12/2017   Procedure: RIGHT/LEFT HEART CATH AND CORONARY ANGIOGRAPHY;  Surgeon: Darlis Eisenmenger, MD;  Location: [facility] INVASIVE CV LAB;  Service: Cardiovascular;  Laterality: N/A;   SPHINCTEROTOMY  11/08/2017   Procedure: SPHINCTEROTOMY;  Surgeon: Alvis Jourdain, MD;  Location: [facility] ENDOSCOPY;  Service: Endoscopy;;   STENT REMOVAL  01/11/2018   Procedure: STENT REMOVAL;  Surgeon: Albertina Hugger, MD;  Location: [facility] ENDOSCOPY;  Service: Gastroenterology;;   TONSILLECTOMY AND ADENOIDECTOMY      Current Medications: Outpatient Medications Prior to Visit  Medication Sig Dispense Refill  allopurinol  (ZYLOPRIM ) 100 MG tablet Take 100 mg by  mouth 2 (two) times daily.      Calcium  Carb-Cholecalciferol (CALCIUM  600 + D PO) Take 1 tablet by mouth 2 (two) times daily.     carvedilol  (COREG ) 6.25 MG tablet TAKE 1 TABLET BY MOUTH TWICE DAILY WITH A MEAL. 180 tablet 3   Cholecalciferol (VITAMIN D3) 125 MCG (5000 UT) TABS Take 1 tablet by mouth in the morning and at bedtime.     colestipol  (COLESTID ) 1 g tablet TAKE (2) TABLETS BY MOUTH ONCE DAILY. 60 tablet 4   Cyanocobalamin  (B-12 COMPLIANCE INJECTION IJ) Inject 1 Application as directed every 28 (twenty-eight) days.     empagliflozin  (JARDIANCE ) 10 MG TABS tablet TAKE 1 TABLET DAILY BEFORE BREAKFAST. 30 tablet 8   ENTRESTO  97-103 MG TAKE 1 TABLET BY MOUTH TWICE DAILY. 60 tablet 6   furosemide  (LASIX ) 20 MG tablet Take 1 tablet (20 mg total) by mouth as needed for fluid or edema. 30 tablet 3   hydrocortisone  (ANUSOL -HC) 2.5 % rectal cream Place 1 application. rectally 2 (two) times daily as needed for hemorrhoids. 60 g 1   levothyroxine  (SYNTHROID ) 50 MCG tablet Take 50 mcg by mouth daily.     Magnesium  Oxide 400 MG CAPS Take 1 capsule (400 mg total) by mouth in the morning and at bedtime. 30 capsule 6   potassium chloride  SA (KLOR-CON  M) 20 MEQ tablet TAKE 1 TABLET BY MOUTH TWICE DAILY 60 tablet 11   rosuvastatin  (CRESTOR ) 10 MG tablet TAKE 1 TABLET BY MOUTH ONCE A DAY. 90 tablet 3   spironolactone  (ALDACTONE ) 25 MG tablet TAKE 1 TABLET BY MOUTH DAILY. 90 tablet 3   pantoprazole  (PROTONIX ) 40 MG tablet Take 1 tablet (40 mg total) by mouth daily. 30 tablet 1   No facility-administered medications prior to visit.     Allergies:   Ozempic  (0.25 or 0.5 mg-dose) [semaglutide (0.25 or 0.5mg -dos)]   Social History   Socioeconomic History   Marital status: Married    Spouse name: Not on file   Number of children: 1   Years of education: Not on file   Highest education level: Not on file  Occupational History   Occupation: Mining engineer  Tobacco Use   Smoking status: Former     Current packs/day: 0.00    Average packs/day: 1.5 packs/day for 3.0 years (4.5 ttl pk-yrs)    Types: Cigarettes    Start date: 04/07/1977    Quit date: 04/07/1980    Years since quitting: 43.4   Smokeless tobacco: Never  Vaping Use   Vaping status: Never Used  Substance and Sexual Activity   Alcohol use: No   Drug use: No    Comment: quit in 1999, marijuana   Sexual activity: Not on file  Other Topics Concern   Not on file  Social History Narrative   Not on file   Social Drivers of Health   Financial Resource Strain: Not on file  Food Insecurity: Not on file  Transportation Needs: Not on file  Physical Activity: Not on file  Stress: Not on file  Social Connections: Not on file     Family History:  The patient's family history is notable for diabetes, hyperlipidemia, lung cancer, stroke, and arthritis.  ROS General: Negative; No fevers, chills, or night sweats;  HEENT: Negative; No changes in vision or hearing, sinus congestion, difficulty swallowing Pulmonary: Negative; No cough, wheezing, shortness of breath, hemoptysis Cardiovascular: History of nonischemic heart cardiomyopathy, hypertension; exertional dyspnea,  occasional swelling, hyperlipidemia GI: Positive for GERD GU: Negative; No dysuria, hematuria, or difficulty voiding Musculoskeletal: Negative; no myalgias, joint pain, or weakness Hematologic/Oncology: Negative; no easy bruising, bleeding Endocrine: Negative; no heat/cold intolerance; no diabetes Neuro: Negative; no changes in balance, headaches Skin: Negative; No rashes or skin lesions Psychiatric: Negative; No behavioral problems, depression Sleep:  See HPI Other comprehensive 14 point system review is negative.   PHYSICAL EXAM:   VS:  BP 122/76   Pulse 68   Ht 5' 9.5" (1.765 m)   Wt 284 lb (128.8 kg)   SpO2 96%   BMI 41.34 kg/m     Repeat blood pressure by me was 102/64.  Wt Readings from Last 3 Encounters:  08/24/23 284 lb (128.8 kg)  02/26/23  280 lb (127 kg)  01/06/23 276 lb (125.2 kg)    General: Alert, oriented, no distress.  Morbid obesity Skin: normal turgor, no rashes, warm and dry HEENT: Normocephalic, atraumatic. Pupils equal round and reactive to light; sclera anicteric; extraocular muscles intact;  Nose without nasal septal hypertrophy Mouth/Parynx benign; Mallinpatti scale 3/4 Neck: Thick neck; no JVD, no carotid bruits; normal carotid upstroke Lungs: clear to ausculatation and percussion; no wheezing or rales Chest wall: without tenderness to palpitation Heart: PMI not displaced, RRR, s1 s2 normal, 1/6 systolic murmur, no diastolic murmur, no rubs, gallops, thrills, or heaves Abdomen: Central adiposity; soft, nontender; no hepatosplenomehaly, BS+; abdominal aorta nontender and not dilated by palpation. Back: no CVA tenderness Pulses 2+ Musculoskeletal: full range of motion, normal strength, no joint deformities Extremities: no clubbing cyanosis or edema, Homan's sign negative  Neurologic: grossly nonfocal; Cranial nerves grossly wnl Psychologic: Normal mood and affect     Studies/Labs Reviewed:   EKG Interpretation Date/Time:  Monday Aug 24 2023 14:01:08 EDT Ventricular Rate:  68 PR Interval:  192 QRS Duration:  158 QT Interval:  422 QTC Calculation: 448 R Axis:   -27  Text Interpretation: Normal sinus rhythm Left bundle branch block When compared with ECG of 26-Feb-2023 08:39, QRS axis Shifted left T wave inversion no longer evident in Inferior leads T wave inversion more evident in Lateral leads Confirmed by Magnus Schuller ([id number]) on 08/24/2023 3:01:32 PM    March 24, 2022 ECG (independently read by me): NSR at 60, LBBB  June 18, 2020 ECG (independently read by me): Sinus bradycardia at 52; LBBB  08/04/2017 ECG (independently read by me): Sinus bradycardia 55 bpm.  Left bundle branch block with repolarization changes.  No ectopy.  ECG (independently read by me): Sinus bradycardia 57 bpm.  First-degree  AV block with PR interval at 200 ms.  Left bundle branch block.  QTc interval 463 ms.  Recent Labs:    Latest Ref Rng & Units 05/14/2023   11:18 AM 02/26/2023    9:00 AM 10/30/2022    8:43 AM  BMP  Glucose 70 - 99 mg/dL 97  97  914   BUN 8 - 27 mg/dL 17  14  14    Creatinine 0.76 - 1.27 mg/dL 7.82  9.56  2.13   BUN/Creat Ratio 10 - 24 11     Sodium 134 - 144 mmol/L 137  133  134   Potassium 3.5 - 5.2 mmol/L 5.0  4.5  4.6   Chloride 96 - 106 mmol/L 99  104  100   CO2 20 - 29 mmol/L 22  21  23    Calcium  8.6 - 10.2 mg/dL 9.7  9.1  8.9  Latest Ref Rng & Units 06/27/2021    2:16 PM 12/28/2020    4:20 PM 06/07/2019   10:55 AM  Hepatic Function  Total Protein 6.0 - 8.3 g/dL 7.6  7.6  7.2   Albumin  3.5 - 5.2 g/dL 4.7  4.1  4.1   AST 0 - 37 U/L 16  18  33   ALT 0 - 53 U/L 14  27  42   Alk Phosphatase 39 - 117 U/L 49  43  45   Total Bilirubin 0.2 - 1.2 mg/dL 1.0  0.2  0.6        Latest Ref Rng & Units 06/27/2021    2:16 PM 01/03/2021    5:00 AM 01/02/2021    5:01 AM  CBC  WBC 4.0 - 10.5 K/uL 7.2  7.8  6.6   Hemoglobin 13.0 - 17.0 g/dL 40.9  9.3  9.3   Hematocrit 39.0 - 52.0 % 38.3  28.0  27.4   Platelets 150.0 - 400.0 K/uL 219.0  155  138    Lab Results  Component Value Date   MCV 93.1 06/27/2021   MCV 98.2 01/03/2021   MCV 96.8 01/02/2021   Lab Results  Component Value Date   TSH 1.02 06/27/2021   Lab Results  Component Value Date   HGBA1C 6.2 (H) 12/28/2020     BNP    Component Value Date/Time   BNP 11.5 02/26/2023 0900    ProBNP    Component Value Date/Time   PROBNP <30.0 03/12/2009 1249     Lipid Panel     Component Value Date/Time   CHOL 100 10/30/2022 0843   TRIG 254 (H) 10/30/2022 0843   HDL 22 (L) 10/30/2022 0843   CHOLHDL 4.5 10/30/2022 0843   VLDL 51 (H) 10/30/2022 0843   LDLCALC 27 10/30/2022 0843   LDLDIRECT 161.2 10/19/2008 0946     RADIOLOGY: No results found.   Additional studies/ records that were reviewed today include:   I review the office records of Dr. Brian Campanile, the patient's sleep study with CPAP titration, and have obtain a download from March 25 through 07/28/2016.  I reviewed the most recent records of Dr. Mitzie Anda.   New download from May 16, 2020 through June 14, 2020 was obtained.  ASSESSMENT:    1. OSA on CPAP   2. Chronic systolic CHF (congestive heart failure) (HCC)   3. NICM (nonischemic cardiomyopathy) (HCC)   4. LBBB (left bundle branch block)   5. Essential hypertension   6. Mixed hyperlipidemia   7. Morbid obesity [facility])     PLAN:  Mr. Nicholas Gray is a 63 year old male who has a history of a nonischemic cardiomyopathy with an ejection fraction initially at 30-35% which had decreased to 25 to 30% in May 2019.  He is  followed by Dr. Mitzie Anda in advanced heart failure clinic on medications including carvedilol  25 mg twice a day, Entresto  97/103 mg twice a day, Farxiga  10 mg, furosemide  20 mg as needed and spironolactone  25 mg daily.  He has been on CPAP therapy since his set up date on June 21, 2015.  With the upgrade to [employer], his old machine was no longer wireless.  He received a new ResMed AirSense 10 AutoSet unit on April 18, 2022.  When I saw him in April 2020 for compliance was excellent with 100% use with average use greater than 8 hours per night.  His machine is currently set at 14 cm.  He has been using  a fullface mask.  He typically goes to bed at 10 PM and wakes up around 6 AM.  I again discussed the benefit of CPAP therapy particularly with reference to his cardiovascular health and commended him on his commitment for continued use.  Since at times he may require potential slight pressure alteration, I suggested that we change him from a set pressure at 14 cm to a pressure range of 14 up to 18 cm.  I discussed that in the event he needed more pressure on certain nights, this will allow his pressure to increase as needed but it will not increase unless it is necessary.   Over the past year, he has continued to use CPAP with excellent compliance.  I obtained a new download today from April 14 through Aug 18, 2023 which confirms 100% usage and average use at 8 hours and 13 minutes.  At his 14-18 pressure range, AHI is 2.2.  His 95th percentile pressure is 15.3 with maximum average pressure at 15.9.  He continues to be on carvedilol  6.25 mg twice a day, Entresto  97/103 twice a day, Jardiance  10 mg daily, spironolactone  25 mg, and he has a prescription for torsemide to take on a as needed basis for swelling.  He takes pantoprazole  for GERD and rosuvastatin  low-dose 10 mg for hyperlipidemia.  LDL cholesterol in September 2024 was 52.  Triglycerides are mildly increased at 176.  Blood pressure today when taken by me is on the low side but he is asymptomatic and feels well.  ECG is stable and shows normal sinus rhythm at 68 bpm with left bundle branch block and normal intervals.  QTc interval is 448 ms.  He apparently failed a trial of Ozempic  which resulted in profuse diarrhea leading to hospitalization with pneumatosis intestinalis.  Weight is 284 pounds giving him a BMI of 41.34.  He is scheduled to see Dr. Mitzie Anda tomorrow for his follow-up advanced heart failure clinic.  I discussed with him my upcoming retirement.  I will transition him to the sleep care of Dr. Micael Adas with plan follow-up in 1 year or sooner as needed.   Medication Adjustments/Labs and Tests Ordered: Current medicines are reviewed at length with the patient today.  Concerns regarding medicines are outlined above.  Medication changes, Labs and Tests ordered today are listed in the Patient Instructions below. Patient Instructions  Medication Instructions:  NO CHANGES *If you need a refill on your cardiac medications before your next appointment, please call your pharmacy*  Lab Work: NO LABS If you have labs (blood work) drawn today and your tests are completely normal, you will receive your results only  by: MyChart Message (if you have MyChart) OR A paper copy in the mail If you have any lab test that is abnormal or we need to change your treatment, we will call you to review the results.  Testing/Procedures: NO TESTING  Follow-Up: At [facility], you and your health needs are our priority.  As part of our continuing mission to provide you with exceptional heart care, our providers are all part of one team.  This team includes your primary Cardiologist (physician) and Advanced Practice Providers or APPs (Physician Assistants and Nurse Practitioners) who all work together to provide you with the care you need, when you need it.  Your next appointment:   1 year(s)  Provider:   Gaylyn Keas, MD    Signed, Magnus Schuller, MD,FACC, ABSM Diplomate, American Board of Sleep Medicine  08/24/2023 3:43 PM  [facility] [address], [address], [place], [place]  [address] Phone: [phone]

## 2023-08-24 NOTE — Patient Instructions (Signed)
 Medication Instructions:  NO CHANGES *If you need a refill on your cardiac medications before your next appointment, please call your pharmacy*  Lab Work: NO LABS If you have labs (blood work) drawn today and your tests are completely normal, you will receive your results only by: MyChart Message (if you have MyChart) OR A paper copy in the mail If you have any lab test that is abnormal or we need to change your treatment, we will call you to review the results.  Testing/Procedures: NO TESTING  Follow-Up: At [facility], you and your health needs are our priority.  As part of our continuing mission to provide you with exceptional heart care, our providers are all part of one team.  This team includes your primary Cardiologist (physician) and Advanced Practice Providers or APPs (Physician Assistants and Nurse Practitioners) who all work together to provide you with the care you need, when you need it.  Your next appointment:   1 year(s)  Provider:   Gaylyn Keas, MD

## 2023-08-25 ENCOUNTER — Ambulatory Visit (HOSPITAL_COMMUNITY): Payer: Self-pay | Admitting: Cardiology

## 2023-08-25 ENCOUNTER — Encounter (HOSPITAL_COMMUNITY): Payer: Self-pay | Admitting: Cardiology

## 2023-08-25 ENCOUNTER — Ambulatory Visit (HOSPITAL_COMMUNITY)
Admission: RE | Admit: 2023-08-25 | Discharge: 2023-08-25 | Disposition: A | Discharge status: Home / Self Care | Payer: Self-pay | Source: Ambulatory Visit | Attending: Cardiology | Admitting: Cardiology

## 2023-08-25 VITALS — BP 102/60 | HR 61 | Wt 285.8 lb

## 2023-08-25 DIAGNOSIS — Z79899 Other long term (current) drug therapy: Secondary | ICD-10-CM | POA: Diagnosis not present

## 2023-08-25 DIAGNOSIS — I110 Hypertensive heart disease with heart failure: Secondary | ICD-10-CM | POA: Diagnosis not present

## 2023-08-25 DIAGNOSIS — Z87891 Personal history of nicotine dependence: Secondary | ICD-10-CM | POA: Insufficient documentation

## 2023-08-25 DIAGNOSIS — I5022 Chronic systolic (congestive) heart failure: Secondary | ICD-10-CM | POA: Diagnosis present

## 2023-08-25 DIAGNOSIS — Z7182 Exercise counseling: Secondary | ICD-10-CM | POA: Insufficient documentation

## 2023-08-25 DIAGNOSIS — E669 Obesity, unspecified: Secondary | ICD-10-CM | POA: Insufficient documentation

## 2023-08-25 DIAGNOSIS — R0602 Shortness of breath: Secondary | ICD-10-CM | POA: Diagnosis not present

## 2023-08-25 DIAGNOSIS — I428 Other cardiomyopathies: Secondary | ICD-10-CM | POA: Insufficient documentation

## 2023-08-25 DIAGNOSIS — I447 Left bundle-branch block, unspecified: Secondary | ICD-10-CM | POA: Diagnosis not present

## 2023-08-25 DIAGNOSIS — G4733 Obstructive sleep apnea (adult) (pediatric): Secondary | ICD-10-CM | POA: Diagnosis not present

## 2023-08-25 DIAGNOSIS — Z7984 Long term (current) use of oral hypoglycemic drugs: Secondary | ICD-10-CM | POA: Diagnosis not present

## 2023-08-25 DIAGNOSIS — Z6841 Body Mass Index (BMI) 40.0 and over, adult: Secondary | ICD-10-CM | POA: Diagnosis not present

## 2023-08-25 LAB — LIPID PANEL
Cholesterol: 105 mg/dL (ref 0–200)
HDL: 28 mg/dL — ABNORMAL LOW (ref >40)
LDL Cholesterol: 41 mg/dL (ref 0–99)
Total CHOL/HDL Ratio: 3.8 ratio
Triglycerides: 182 mg/dL — ABNORMAL HIGH (ref <150)
VLDL: 36 mg/dL (ref 0–40)

## 2023-08-25 LAB — BRAIN NATRIURETIC PEPTIDE: B Natriuretic Peptide: 9.8 pg/mL (ref 0.0–100.0)

## 2023-08-25 MED ORDER — CARVEDILOL 6.25 MG PO TABS: 9.3750 mg | ORAL_TABLET | Freq: Two times a day (BID) | ORAL | 5 refills | Status: DC

## 2023-08-25 NOTE — Patient Instructions (Signed)
 Medication Changes:  STOP POTASSIUM SUPPLEMENT   INCREASE CARVEDILOL  TO 9.375MG  TWICE DAILY   Lab Work:  Labs done today, your results will be available in MyChart, we will contact you for abnormal readings.  THEN RETURN FOR LABS AGAIN IN 3 MONTHS AS SCHEDULED   Follow-Up in: 6 MONTHS WITH DR. Mitzie Anda WITH AN ECHO ON THE SAME DAY PLEASE CALL OUR OFFICE AROUND SEPTEMBER TO GET SCHEDULED FOR YOUR APPOINTMENT. PHONE NUMBER IS [phone] OPTION 2   At the [facility], you and your health needs are our priority. We have a designated team specialized in the treatment of Heart Failure. This Care Team includes your primary Heart Failure Specialized Cardiologist (physician), Advanced Practice Providers (APPs- Physician Assistants and Nurse Practitioners), and Pharmacist who all work together to provide you with the care you need, when you need it.   You may see any of the following providers on your designated Care Team at your next follow up:  Dr. Jules Oar Dr. Peder Bourdon Dr. Alwin Baars Dr. Judyth Nunnery Nieves Bars, NP Ruddy Corral, [place] [facility] [place], [place] Dennise Fitz, NP [place] Lee, NP Luster Salters, PharmD   Please be sure to bring in all your medications bottles to every appointment.   Need to Contact Us :  If you have any questions or concerns before your next appointment please send us  a message through [place] or call our office at [phone].    TO LEAVE A MESSAGE FOR THE NURSE SELECT OPTION 2, PLEASE LEAVE A MESSAGE INCLUDING: YOUR NAME DATE OF BIRTH CALL BACK NUMBER REASON FOR CALL**this is important as we prioritize the call backs  YOU WILL RECEIVE A CALL BACK THE SAME DAY AS LONG AS YOU CALL BEFORE 4:00 PM

## 2023-08-25 NOTE — Progress Notes (Signed)
 [facility] Note   PCP: Kathyleen Parkins, MD HF Cardiology: Dr. Mitzie Anda  Chief complaint: CHF  HPI:  Nicholas Gray is a 63 y.o. male with chronic systolic CHF, NICM (Normal coronaries 2010), HTN, LBBB, Obesity, and OSA on CPAP.  He has been known to have a cardiomyopathy for the last 10 years or so.   Seen in [facility] office 08/13/17 and was overall feeling well. No new symptoms.  Echo 08/27/17 LVEF 25-30%, Grade 1 DD, Mild MR, Mild LAE, mild RV dilation, PA peak pressure 34 mm Hg.  Referred by Dr. Lavonne Prairie to CHF team for further evaluation and treatment of CHF.    Pt admitted 5/30 - 09/13/2017 with Chest/upper abdominal discomfort and pain. CT scan and HIDA consistent with acute cholecystitis. Underwent laproscopic chole on 09/07/17 and found to have gangrenous GB. 09/09/17 repeat HIDA scan showed bile leak. ERCP 09/11/17, but patient had failed stent placement. Considered for IR for drain placement, but decided on conservative management.  He was admitted again with RUQ pain in 7/19, had ERCP with bile leak and choledocholithiasis noted.  He had stone removal and stent placed.   LHC/RHC in 7/19 showed no significant CAD, optimized filling pressures and preserved cardiac output. Cardiac MRI in 11/19 showed improvement in LV EF to 51% with mildly decreased RV systolic function, no LGE.   Echo in 5/21 showed EF in the range of 40% though it is a technically difficult study.  There is septal-lateral dyssynchrony. Cardiac MRI in 6/21 showed LV EF 46% with septal-lateral dyssynchrony, RV EF 51%, no LGE.  Echo in 8/22 showed EF 45% with mild diffuse hypokinesis and septal-lateral dyssynchrony, normal RV.   Patient started Ozempic  for weight loss.  He lost weight but developed profuse diarrhea and actually ended up in the hospital with pneumatosis intestinalis.  He is now off Ozempic .   Echo in 7/23 showed EF 45%, septal-lateral dyssynchrony, normal RV systolic function with mild RV  enlargement, IVC normal.   Echo in 11/24 showed EF 45% with septal-lateral dyssynchrony and normal RV.   He returns today for followup of CHF.  Weight is up 5 lbs.  Not getting much exercise and not really watching what he eats.  He is short of breath walking up hills and walking longer distances.  Walks in the woods when he hunts.  No chest pain.  No lightheadedness or palpitations.   ECG (personally reviewed): NSR, LBBB 158 msec  Labs (6/19): K 4.2, creatinine 1.14 Labs (8/19): K 3.9, creatinine 1.14, normal LFTs Labs (01/05/2018): K 4.5 creatinine 1.23  Labs (11/19): K 3.9, creatinine 1.16 Labs (1/20): K 3.9, creatinine 1.13 Labs (3/21): K 4.2, creatinine 1.28, hgb 13.7, LDL 57, HDL 28 Labs (5/21): K 4.2, creatinine 1.33 Labs (11/21): K 4.1, creatinine 1.43 Labs (1/22): LDL 48 Labs (4/22): K 3.8, creatinine 1.16 Labs (10/22): LDL 45 Labs (12/22): Mg 1.8, K 3.9, creatinine 1.02 Labs (3/23): K 4.5, creatinine 1.5 Labs (7/23): LDL 49, K 4.5, creatinine 0.98 Labs (1/24): K 4.4, creatinine 1.36 Labs (7/24): LDL 27, TGs 254, BNP 23, K 4.6, creatinine 1.43 Labs (2/25): K 5, creatinine 1.48  Social History: Lives in [place], [place].  He is a former smoker, quit in 1982. Denies heavy ETOH use.  Family history: He denies immediate family history of CHF, but thinks his maternal grandparents may have had "heart problems"   Review of systems complete and found to be negative unless listed in HPI.    Past Medical History:  1. Chronic systolic CHF: Suspected nonischemic cardiomyopathy (normal coronaries 2010).  - Echo 2012 with EF 30% - Echo 12/15 with EF 40-45% - Echo 2017 with EF 35-40% - Echo 08/27/17 LVEF 25-30%, Grade 1 DD, Mild MR, Mild LAE, mild RV dilation, PA peak pressure 34 mm Hg.  - LHC/RHC (7/19): No significant coronary disease. RA 7, PA 39/12 mean 25, PCWP mean 11, CI 3.68, PVR 1.65 WU.  - Cardiac MRI (11/19): LV EF 51% with septal-lateral dyssynchrony, mild RV dilation with RV EF  39%, no LGE.  - Echo (5/21): Technically difficult, EF around 40% with diffuse hypokinesis and septal-lateral dyssynchrony, normal RV.  - Cardiac MRI (6/21): LV EF 46% with septal-lateral dyssynchrony, RV EF 51%, no LGE.  - Echo (8/22): EF 45% with mild diffuse hypokinesis and septal-lateral dyssynchrony, normal RV.  - Echo (7/23): EF 45%, septal-lateral dyssynchrony, normal RV systolic function with mild RV enlargement, IVC normal.   - Echo (11/24): EF 45% with septal-lateral dyssynchrony and normal RV. 2. HTN 3. LBBB, chronic 4. Obesity Body mass index is 41.6 kg/m. 5. OSA: Reports compliance with his CPAP.  6. GERD 7. Cholecystitis: Underwent laproscopic chole on 09/07/17 and found to have gangrenous GB.  - 09/09/17 repeat HIDA scan showed bile leak.  - ERCP 09/11/17, but patient had failed stent placement. - ERCP 7/19 with sphincterotomy, stone removal, stent placement.   Current Outpatient Medications  Medication Sig Dispense Refill   allopurinol  (ZYLOPRIM ) 100 MG tablet Take 100 mg by mouth 2 (two) times daily.      Calcium  Carb-Cholecalciferol (CALCIUM  600 + D PO) Take 1 tablet by mouth 2 (two) times daily.     Cholecalciferol (VITAMIN D3) 125 MCG (5000 UT) TABS Take 1 tablet by mouth in the morning and at bedtime.     colestipol  (COLESTID ) 1 g tablet TAKE (2) TABLETS BY MOUTH ONCE DAILY. 60 tablet 4   Cyanocobalamin  (B-12 COMPLIANCE INJECTION IJ) Inject 1 Application as directed every 28 (twenty-eight) days.     empagliflozin  (JARDIANCE ) 10 MG TABS tablet TAKE 1 TABLET DAILY BEFORE BREAKFAST. 30 tablet 8   ENTRESTO  97-103 MG TAKE 1 TABLET BY MOUTH TWICE DAILY. 60 tablet 6   furosemide  (LASIX ) 20 MG tablet Take 1 tablet (20 mg total) by mouth as needed for fluid or edema. 30 tablet 3   hydrocortisone  (ANUSOL -HC) 2.5 % rectal cream Place 1 application. rectally 2 (two) times daily as needed for hemorrhoids. 60 g 1   levothyroxine  (SYNTHROID ) 50 MCG tablet Take 50 mcg by mouth daily.      Magnesium  Oxide 400 MG CAPS Take 1 capsule (400 mg total) by mouth in the morning and at bedtime. 30 capsule 6   pantoprazole  (PROTONIX ) 40 MG tablet Take 1 tablet (40 mg total) by mouth daily. 30 tablet 1   rosuvastatin  (CRESTOR ) 10 MG tablet TAKE 1 TABLET BY MOUTH ONCE A DAY. 90 tablet 3   spironolactone  (ALDACTONE ) 25 MG tablet TAKE 1 TABLET BY MOUTH DAILY. 90 tablet 3   carvedilol  (COREG ) 6.25 MG tablet Take 1.5 tablets (9.375 mg total) by mouth 2 (two) times daily with a meal. 90 tablet 5   No current facility-administered medications for this encounter.   Allergies  Allergen Reactions   Ozempic  (0.25 Or 0.5 Mg-Dose) [Semaglutide (0.25 Or 0.5mg -Dos)]     GI upset on 0.25mg  dose    Vitals:   08/25/23 0837  BP: 102/60  Pulse: 61  SpO2: 94%  Weight: 129.6 kg (285 lb 12.8 oz)  Wt Readings from Last 3 Encounters:  08/25/23 129.6 kg (285 lb 12.8 oz)  08/24/23 128.8 kg (284 lb)  02/26/23 127 kg (280 lb)    PHYSICAL EXAM: General: NAD, obese.  Neck: No JVD, no thyromegaly or thyroid nodule.  Lungs: Clear to auscultation bilaterally with normal respiratory effort. CV: Nondisplaced PMI.  Heart regular S1/S2, no S3/S4, no murmur.  No peripheral edema.  No carotid bruit.  Normal pedal pulses.  Abdomen: Soft, nontender, no hepatosplenomegaly, no distention.  Skin: Intact without lesions or rashes.  Neurologic: Alert and oriented x 3.  Psych: Normal affect. Extremities: No clubbing or cyanosis.  HEENT: Normal.   ASSESSMENT & PLAN:  1. Chronic systolic CHF: Nonischemic cardiomyopathy by coronary angiography in 2010 and 7/19.  He has had a cardiomyopathy for at least the last 10 years.  Echo in 5/19 showed a fall in EF to the 25-30% range.  He does not have an ICD as prior echoes had shown EF in the 40% range.  It is possible that he has a LBBB cardiomyopathy.  Prior viral myocarditis is also a possibility. No significant FH of CHF and no heavy ETOH/substance abuse.  Cardiac MRI in 6/21  showed LV EF 46% with septal-lateral dyssynchrony, RV EF 51%, no LGE.  Echoes in 8/22, 7/23, and 11/24 showed EF 45% with septal-lateral dyssynchrony.  NYHA class II, not volume overloaded on exam.  Weight up but think this is caloric.   - He does not appear to need standing Lasix .  - Continue current Entresto , spironolactone , and dapagliflozin  (all at goal).  - Increase Coreg  to 9.375 mg bid and then go up to 12.5 mg bid if he tolerates.  - I think he can stop KCl.  - BMET/BNP today.  - EF has been out of CRT-D range.    2. HTN: BP now on the lower side.  3. LBBB: Chronic, unchanged.  4. Obesity: Body mass index is 41.6 kg/m.  - Encouraged weight loss by diet/exercise => not getting much exercise and diet is poor.  Long discussion about this today.   - He did not tolerate semaglutide  witih profuse diarrhea.  5. OSA: Continue CPAP.  6. Hyperlipidemia: ?Myalgias related to simvastatin .  - Continue Crestor , check lipids today.    Followup in 6 months but will need BMET in 3 months.   I spent 32 minutes reviewing records, interviewing/examining patient, and managing orders.   Peder Bourdon 08/25/2023

## 2023-09-01 ENCOUNTER — Other Ambulatory Visit (HOSPITAL_COMMUNITY): Payer: Self-pay

## 2023-09-01 DIAGNOSIS — I5022 Chronic systolic (congestive) heart failure: Secondary | ICD-10-CM

## 2023-09-01 MED ORDER — SPIRONOLACTONE 25 MG PO TABS
25.0000 mg | ORAL_TABLET | Freq: Every day | ORAL | 3 refills | Status: DC
Start: 2023-09-01 — End: 2024-01-29

## 2023-09-04 ENCOUNTER — Other Ambulatory Visit (HOSPITAL_COMMUNITY): Payer: Self-pay | Admitting: Cardiology

## 2023-09-04 ENCOUNTER — Other Ambulatory Visit: Payer: Self-pay | Admitting: Gastroenterology

## 2023-11-17 ENCOUNTER — Ambulatory Visit (HOSPITAL_COMMUNITY)
Admission: RE | Admit: 2023-11-17 | Discharge: 2023-11-17 | Disposition: A | Discharge status: Home / Self Care | Source: Ambulatory Visit | Attending: Cardiology | Admitting: Cardiology

## 2023-11-17 DIAGNOSIS — I5022 Chronic systolic (congestive) heart failure: Secondary | ICD-10-CM | POA: Insufficient documentation

## 2023-11-17 LAB — BASIC METABOLIC PANEL WITH GFR
Anion gap: 11 (ref 5–15)
BUN: 14 mg/dL (ref 8–23)
CO2: 24 mmol/L (ref 22–32)
Calcium: 9.1 mg/dL (ref 8.9–10.3)
Chloride: 101 mmol/L (ref 98–111)
Creatinine, Ser: 1.4 mg/dL — ABNORMAL HIGH (ref 0.61–1.24)
GFR, Estimated: 56 mL/min — ABNORMAL LOW (ref >60)
Glucose, Bld: 166 mg/dL — ABNORMAL HIGH (ref 70–99)
Potassium: 3.7 mmol/L (ref 3.5–5.1)
Sodium: 136 mmol/L (ref 135–145)

## 2023-11-19 ENCOUNTER — Other Ambulatory Visit (HOSPITAL_COMMUNITY)

## 2024-01-04 ENCOUNTER — Other Ambulatory Visit (HOSPITAL_COMMUNITY): Payer: Self-pay | Admitting: Cardiology

## 2024-01-29 ENCOUNTER — Other Ambulatory Visit (HOSPITAL_COMMUNITY): Payer: Self-pay

## 2024-01-29 ENCOUNTER — Encounter (HOSPITAL_COMMUNITY): Payer: Self-pay | Admitting: Cardiology

## 2024-01-29 DIAGNOSIS — I5022 Chronic systolic (congestive) heart failure: Secondary | ICD-10-CM

## 2024-01-29 MED ORDER — SACUBITRIL-VALSARTAN 97-103 MG PO TABS: 1.0000 | ORAL_TABLET | Freq: Two times a day (BID) | ORAL | 3 refills | Status: AC

## 2024-01-29 MED ORDER — SPIRONOLACTONE 25 MG PO TABS: 25.0000 mg | ORAL_TABLET | Freq: Every day | ORAL | 3 refills | Status: AC

## 2024-02-04 ENCOUNTER — Other Ambulatory Visit: Payer: Self-pay | Admitting: Gastroenterology

## 2024-03-08 ENCOUNTER — Ambulatory Visit (HOSPITAL_COMMUNITY)
Admission: RE | Admit: 2024-03-08 | Discharge: 2024-03-08 | Disposition: A | Discharge status: Home / Self Care | Source: Ambulatory Visit | Attending: *Deleted | Admitting: *Deleted

## 2024-03-08 ENCOUNTER — Ambulatory Visit (HOSPITAL_COMMUNITY): Payer: Self-pay | Admitting: Cardiology

## 2024-03-08 ENCOUNTER — Ambulatory Visit (HOSPITAL_COMMUNITY)
Admission: RE | Admit: 2024-03-08 | Discharge: 2024-03-08 | Disposition: A | Source: Ambulatory Visit | Attending: Cardiology | Admitting: Cardiology

## 2024-03-08 VITALS — BP 104/64 | HR 65 | Wt 286.4 lb

## 2024-03-08 DIAGNOSIS — G4733 Obstructive sleep apnea (adult) (pediatric): Secondary | ICD-10-CM | POA: Diagnosis not present

## 2024-03-08 DIAGNOSIS — R9431 Abnormal electrocardiogram [ECG] [EKG]: Secondary | ICD-10-CM | POA: Diagnosis not present

## 2024-03-08 DIAGNOSIS — I5022 Chronic systolic (congestive) heart failure: Secondary | ICD-10-CM

## 2024-03-08 DIAGNOSIS — Z79890 Hormone replacement therapy (postmenopausal): Secondary | ICD-10-CM | POA: Insufficient documentation

## 2024-03-08 DIAGNOSIS — I358 Other nonrheumatic aortic valve disorders: Secondary | ICD-10-CM | POA: Diagnosis not present

## 2024-03-08 DIAGNOSIS — Z79899 Other long term (current) drug therapy: Secondary | ICD-10-CM | POA: Insufficient documentation

## 2024-03-08 DIAGNOSIS — I071 Rheumatic tricuspid insufficiency: Secondary | ICD-10-CM | POA: Insufficient documentation

## 2024-03-08 DIAGNOSIS — I110 Hypertensive heart disease with heart failure: Secondary | ICD-10-CM | POA: Diagnosis not present

## 2024-03-08 DIAGNOSIS — E669 Obesity, unspecified: Secondary | ICD-10-CM | POA: Insufficient documentation

## 2024-03-08 DIAGNOSIS — I447 Left bundle-branch block, unspecified: Secondary | ICD-10-CM | POA: Diagnosis not present

## 2024-03-08 DIAGNOSIS — I429 Cardiomyopathy, unspecified: Secondary | ICD-10-CM | POA: Diagnosis not present

## 2024-03-08 DIAGNOSIS — Z7182 Exercise counseling: Secondary | ICD-10-CM | POA: Diagnosis not present

## 2024-03-08 DIAGNOSIS — Z87891 Personal history of nicotine dependence: Secondary | ICD-10-CM | POA: Diagnosis not present

## 2024-03-08 DIAGNOSIS — Z6841 Body Mass Index (BMI) 40.0 and over, adult: Secondary | ICD-10-CM | POA: Insufficient documentation

## 2024-03-08 DIAGNOSIS — I499 Cardiac arrhythmia, unspecified: Secondary | ICD-10-CM | POA: Insufficient documentation

## 2024-03-08 DIAGNOSIS — E785 Hyperlipidemia, unspecified: Secondary | ICD-10-CM | POA: Diagnosis not present

## 2024-03-08 LAB — BASIC METABOLIC PANEL WITH GFR
Anion gap: 11 (ref 5–15)
BUN: 21 mg/dL (ref 8–23)
CO2: 23 mmol/L (ref 22–32)
Calcium: 9.3 mg/dL (ref 8.9–10.3)
Chloride: 101 mmol/L (ref 98–111)
Creatinine, Ser: 1.47 mg/dL — ABNORMAL HIGH (ref 0.61–1.24)
GFR, Estimated: 53 mL/min — ABNORMAL LOW (ref >60)
Glucose, Bld: 106 mg/dL — ABNORMAL HIGH (ref 70–99)
Potassium: 4.3 mmol/L (ref 3.5–5.1)
Sodium: 135 mmol/L (ref 135–145)

## 2024-03-08 LAB — ECHOCARDIOGRAM COMPLETE
Area-P 1/2: 2.99 cm2
Calc EF: 47.9 %
S' Lateral: 4 cm
Single Plane A2C EF: 54 %
Single Plane A4C EF: 40.3 %

## 2024-03-08 LAB — BRAIN NATRIURETIC PEPTIDE: B Natriuretic Peptide: 6.3 pg/mL (ref 0.0–100.0)

## 2024-03-08 MED ORDER — CARVEDILOL 12.5 MG PO TABS: 12.5000 mg | ORAL_TABLET | Freq: Two times a day (BID) | ORAL | 6 refills | Status: AC

## 2024-03-08 NOTE — Patient Instructions (Signed)
 Medication Changes:  INCREASE Carvedilol  to 12.5 mg Twice daily   Lab Work:  Labs done today, your results will be available in MyChart, we will contact you for abnormal readings.  Your physician recommends that you return for lab work in: 3 months  Special Instructions // Education:  Do the following things EVERYDAY: Weigh yourself in the morning before breakfast. Write it down and keep it in a log. Take your medicines as prescribed Eat low salt foods--Limit salt (sodium) to 2000 mg per day.  Stay as active as you can everyday Limit all fluids for the day to less than 2 liters   Follow-Up in: 6 months   At the [facility], you and your health needs are our priority. We have a designated team specialized in the treatment of Heart Failure. This Care Team includes your primary Heart Failure Specialized Cardiologist (physician), Advanced Practice Providers (APPs- Physician Assistants and Nurse Practitioners), and Pharmacist who all work together to provide you with the care you need, when you need it.   You may see any of the following providers on your designated Care Team at your next follow up:  Dr. Toribio Fuel Dr. Ezra Shuck Dr. Odis Brownie Greig Mosses, NP Caffie Shed, [place] [facility] [place], [place] Beckey Coe, NP Jordan Lee, NP Tinnie Redman, PharmD   Please be sure to bring in all your medications bottles to every appointment.   Need to Contact Us :  If you have any questions or concerns before your next appointment please send us  a message through [place] or call our office at [phone].    TO LEAVE A MESSAGE FOR THE NURSE SELECT OPTION 2, PLEASE LEAVE A MESSAGE INCLUDING: YOUR NAME DATE OF BIRTH CALL BACK NUMBER REASON FOR CALL**this is important as we prioritize the call backs  YOU WILL RECEIVE A CALL BACK THE SAME DAY AS LONG AS YOU CALL BEFORE 4:00 PM

## 2024-03-08 NOTE — Progress Notes (Signed)
  Echocardiogram 2D Echocardiogram has been performed.  Nicholas Gray 03/08/2024, 8:56 AM

## 2024-03-08 NOTE — Progress Notes (Signed)
 [facility] Note   PCP: Bertell Satterfield, MD HF Cardiology: Dr. Rolan  Chief complaint: CHF  HPI:  Nicholas Gray is a 63 y.o. male with chronic systolic CHF, NICM (Normal coronaries 2010), HTN, LBBB, Obesity, and OSA on CPAP.  He has been known to have a cardiomyopathy for the last 10 years or so.   Seen in [facility] office 08/13/17 and was overall feeling well. No new symptoms.  Echo 08/27/17 LVEF 25-30%, Grade 1 DD, Mild MR, Mild LAE, mild RV dilation, PA peak pressure 34 mm Hg.  Referred by Dr. Lavona to CHF team for further evaluation and treatment of CHF.    Pt admitted 5/30 - 09/13/2017 with Chest/upper abdominal discomfort and pain. CT scan and HIDA consistent with acute cholecystitis. Underwent laproscopic chole on 09/07/17 and found to have gangrenous GB. 09/09/17 repeat HIDA scan showed bile leak. ERCP 09/11/17, but patient had failed stent placement. Considered for IR for drain placement, but decided on conservative management.  He was admitted again with RUQ pain in 7/19, had ERCP with bile leak and choledocholithiasis noted.  He had stone removal and stent placed.   LHC/RHC in 7/19 showed no significant CAD, optimized filling pressures and preserved cardiac output. Cardiac MRI in 11/19 showed improvement in LV EF to 51% with mildly decreased RV systolic function, no LGE.   Echo in 5/21 showed EF in the range of 40% though it is a technically difficult study.  There is septal-lateral dyssynchrony. Cardiac MRI in 6/21 showed LV EF 46% with septal-lateral dyssynchrony, RV EF 51%, no LGE.  Echo in 8/22 showed EF 45% with mild diffuse hypokinesis and septal-lateral dyssynchrony, normal RV.   Patient started Ozempic  for weight loss.  He lost weight but developed profuse diarrhea and actually ended up in the hospital with pneumatosis intestinalis.  He is now off Ozempic .   Echo in 7/23 showed EF 45%, septal-lateral dyssynchrony, normal RV systolic function with mild RV  enlargement, IVC normal.   Echo in 11/24 showed EF 45% with septal-lateral dyssynchrony and normal RV.    Echo was done today and I reviewed, EF 45%, with normal RV systolic function, mild RV enlargement, IVC normal.   He returns today for followup of CHF.  Weight stable.  He is short of breath walking up hills, mildly walking up stairs.  No dyspnea walking on flat ground.  No lightheadedness or palpitations.  No chest pain.  No orthopnea/PND.    ECG (personally reviewed): NSR, LBBB 154 msec  Labs (1/24): K 4.4, creatinine 1.36 Labs (7/24): LDL 27, TGs 254, BNP 23, K 4.6, creatinine 1.43 Labs (2/25): K 5, creatinine 1.48 Labs (5/25): LDL 41, BNP 9.8 Labs (8/25): K 3.7, creatinine 1.4  Social History: Lives in [place], [place].  He is a former smoker, quit in 1982. Denies heavy ETOH use.  Family history: He denies immediate family history of CHF, but thinks his maternal grandparents may have had heart problems   Review of systems complete and found to be negative unless listed in HPI.    Past Medical History: 1. Chronic systolic CHF: Suspected nonischemic cardiomyopathy (normal coronaries 2010).  - Echo 2012 with EF 30% - Echo 12/15 with EF 40-45% - Echo 2017 with EF 35-40% - Echo 08/27/17 LVEF 25-30%, Grade 1 DD, Mild MR, Mild LAE, mild RV dilation, PA peak pressure 34 mm Hg.  - LHC/RHC (7/19): No significant coronary disease. RA 7, PA 39/12 mean 25, PCWP mean 11, CI 3.68, PVR 1.65  WU.  - Cardiac MRI (11/19): LV EF 51% with septal-lateral dyssynchrony, mild RV dilation with RV EF 39%, no LGE.  - Echo (5/21): Technically difficult, EF around 40% with diffuse hypokinesis and septal-lateral dyssynchrony, normal RV.  - Cardiac MRI (6/21): LV EF 46% with septal-lateral dyssynchrony, RV EF 51%, no LGE.  - Echo (8/22): EF 45% with mild diffuse hypokinesis and septal-lateral dyssynchrony, normal RV.  - Echo (7/23): EF 45%, septal-lateral dyssynchrony, normal RV systolic function with mild RV  enlargement, IVC normal.   - Echo (11/24): EF 45% with septal-lateral dyssynchrony and normal RV. - Echo (12/25): EF 45%, with normal RV systolic function, mild RV enlargement, IVC normal.  2. HTN 3. LBBB, chronic 4. Obesity Body mass index is 41.69 kg/m. 5. OSA: Reports compliance with his CPAP.  6. GERD 7. Cholecystitis: Underwent laproscopic chole on 09/07/17 and found to have gangrenous GB.  - 09/09/17 repeat HIDA scan showed bile leak.  - ERCP 09/11/17, but patient had failed stent placement. - ERCP 7/19 with sphincterotomy, stone removal, stent placement.   Current Outpatient Medications  Medication Sig Dispense Refill   allopurinol  (ZYLOPRIM ) 100 MG tablet Take 100 mg by mouth 2 (two) times daily.      Calcium  Carb-Cholecalciferol (CALCIUM  600 + D PO) Take 1 tablet by mouth 2 (two) times daily.     Cholecalciferol (VITAMIN D3) 125 MCG (5000 UT) TABS Take 1 tablet by mouth in the morning and at bedtime.     furosemide  (LASIX ) 20 MG tablet Take 1 tablet (20 mg total) by mouth as needed for fluid or edema. 30 tablet 3   hydrocortisone  (ANUSOL -HC) 2.5 % rectal cream Place 1 application. rectally 2 (two) times daily as needed for hemorrhoids. 60 g 1   JARDIANCE  10 MG TABS tablet TAKE 1 TABLET DAILY BEFORE BREAKFAST. 30 tablet 8   levothyroxine  (SYNTHROID ) 50 MCG tablet Take 50 mcg by mouth daily.     Magnesium  Oxide 400 MG CAPS Take 1 capsule (400 mg total) by mouth in the morning and at bedtime. 30 capsule 6   pantoprazole  (PROTONIX ) 40 MG tablet Take 1 tablet (40 mg total) by mouth daily. 30 tablet 1   rosuvastatin  (CRESTOR ) 10 MG tablet TAKE 1 TABLET BY MOUTH ONCE A DAY. 30 tablet 11   sacubitril -valsartan  (ENTRESTO ) 97-103 MG Take 1 tablet by mouth 2 (two) times daily. 180 tablet 3   spironolactone  (ALDACTONE ) 25 MG tablet Take 1 tablet (25 mg total) by mouth daily. 90 tablet 3   carvedilol  (COREG ) 12.5 MG tablet Take 1 tablet (12.5 mg total) by mouth 2 (two) times daily with a meal. 60  tablet 6   colestipol  (COLESTID ) 1 g tablet TAKE (2) TABLETS BY MOUTH ONCE DAILY. 60 tablet 0   Cyanocobalamin  (B-12 COMPLIANCE INJECTION IJ) Inject 1 Application as directed every 28 (twenty-eight) days.     No current facility-administered medications for this encounter.   Allergies  Allergen Reactions   Ozempic  (0.25 Or 0.5 Mg-Dose) [Semaglutide (0.25 Or 0.5mg -Dos)]     GI upset on 0.25mg  dose    Vitals:   03/08/24 0851  BP: 104/64  Pulse: 65  SpO2: 94%  Weight: 129.9 kg (286 lb 6.4 oz)   Wt Readings from Last 3 Encounters:  03/08/24 129.9 kg (286 lb 6.4 oz)  08/25/23 129.6 kg (285 lb 12.8 oz)  08/24/23 128.8 kg (284 lb)    PHYSICAL EXAM: General: NAD, obese.  Neck: No JVD, no thyromegaly or thyroid nodule.  Lungs: Clear to  auscultation bilaterally with normal respiratory effort. CV: Nondisplaced PMI.  Heart regular S1/S2, no S3/S4, no murmur.  No peripheral edema.  No carotid bruit.  Normal pedal pulses.  Abdomen: Soft, nontender, no hepatosplenomegaly, no distention.  Skin: Intact without lesions or rashes.  Neurologic: Alert and oriented x 3.  Psych: Normal affect. Extremities: No clubbing or cyanosis.  HEENT: Normal.   ASSESSMENT & PLAN:  1. Chronic systolic CHF: Nonischemic cardiomyopathy by coronary angiography in 2010 and 7/19.  He has had a cardiomyopathy for at least the last 10 years.  Echo in 5/19 showed a fall in EF to the 25-30% range.  He does not have an ICD as prior echoes had shown EF in the 40% range.  It is possible that he has a LBBB cardiomyopathy.  Prior viral myocarditis is also a possibility. No significant FH of CHF and no heavy ETOH/substance abuse.  Cardiac MRI in 6/21 showed LV EF 46% with septal-lateral dyssynchrony, RV EF 51%, no LGE.  Echoes in 8/22, 7/23, 11/24, and 11/25 showed EF 45% with septal-lateral dyssynchrony.  NYHA class II, not volume overloaded on exam.  - He does not appear to need standing Lasix .  - Continue current Entresto ,  spironolactone , and dapagliflozin  (all at goal).  - Increase Coreg  to 12.5 mg bid.  - BMET/BNP today.  - EF has been out of CRT-D range.    2. HTN: BP not elevated.  3. LBBB: Chronic, unchanged.  4. Obesity: Body mass index is 41.69 kg/m.  - Encouraged weight loss by diet/exercise => not getting much exercise and diet is poor.  Discussed again today.  - He did not tolerate semaglutide  with profuse diarrhea.  5. OSA: Continue CPAP.  6. Hyperlipidemia: ?Myalgias related to simvastatin .  - Continue Crestor , good lipids in 5/25.   Followup in 6 months but will need BMET in 3 months.   I spent 31 minutes reviewing records, interviewing/examining patient, and managing orders.   Ezra Shuck 03/08/2024

## 2024-06-07 ENCOUNTER — Ambulatory Visit (HOSPITAL_COMMUNITY)

## 2024-08-30 ENCOUNTER — Ambulatory Visit (HOSPITAL_COMMUNITY)
